# Patient Record
Sex: Female | Born: 1935
Health system: Southern US, Community
[De-identification: ages and names within clinical notes are randomized; demographics above are authoritative.]

## PROBLEM LIST (undated history)

## (undated) DIAGNOSIS — M1990 Unspecified osteoarthritis, unspecified site: Secondary | ICD-10-CM

## (undated) DIAGNOSIS — Z8673 Personal history of transient ischemic attack (TIA), and cerebral infarction without residual deficits: Secondary | ICD-10-CM

## (undated) DIAGNOSIS — H3320 Serous retinal detachment, unspecified eye: Secondary | ICD-10-CM

## (undated) DIAGNOSIS — I442 Atrioventricular block, complete: Secondary | ICD-10-CM

## (undated) DIAGNOSIS — D649 Anemia, unspecified: Secondary | ICD-10-CM

## (undated) DIAGNOSIS — E039 Hypothyroidism, unspecified: Secondary | ICD-10-CM

## (undated) DIAGNOSIS — I272 Pulmonary hypertension, unspecified: Secondary | ICD-10-CM

## (undated) DIAGNOSIS — I4891 Unspecified atrial fibrillation: Secondary | ICD-10-CM

## (undated) DIAGNOSIS — G459 Transient cerebral ischemic attack, unspecified: Secondary | ICD-10-CM

## (undated) DIAGNOSIS — H547 Unspecified visual loss: Secondary | ICD-10-CM

## (undated) DIAGNOSIS — R443 Hallucinations, unspecified: Secondary | ICD-10-CM

## (undated) DIAGNOSIS — R531 Weakness: Secondary | ICD-10-CM

## (undated) DIAGNOSIS — I951 Orthostatic hypotension: Secondary | ICD-10-CM

## (undated) DIAGNOSIS — H4720 Unspecified optic atrophy: Secondary | ICD-10-CM

## (undated) DIAGNOSIS — D696 Thrombocytopenia, unspecified: Secondary | ICD-10-CM

## (undated) HISTORY — PX: ATRIAL FIBRILLATION ABLATION: EP1191

## (undated) HISTORY — DX: Unspecified optic atrophy: H47.20

## (undated) HISTORY — DX: Transient cerebral ischemic attack, unspecified: G45.9

## (undated) HISTORY — DX: Unspecified atrial fibrillation: I48.91

## (undated) HISTORY — PX: RETINAL DETACHMENT SURGERY: SHX105

## (undated) HISTORY — PX: CARDIAC CATHETERIZATION: SHX172

## (undated) HISTORY — PX: CARDIAC PACEMAKER PLACEMENT: SHX583

## (undated) HISTORY — DX: Anemia, unspecified: D64.9

## (undated) HISTORY — DX: Personal history of transient ischemic attack (TIA), and cerebral infarction without residual deficits: Z86.73

## (undated) HISTORY — DX: Atrioventricular block, complete: I44.2

## (undated) HISTORY — DX: Weakness: R53.1

## (undated) HISTORY — DX: Orthostatic hypotension: I95.1

## (undated) HISTORY — DX: Pulmonary hypertension, unspecified: I27.20

## (undated) HISTORY — DX: Hypothyroidism, unspecified: E03.9

## (undated) HISTORY — DX: Serous retinal detachment, unspecified eye: H33.20

## (undated) HISTORY — DX: Thrombocytopenia, unspecified: D69.6

---
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
## 1994-10-29 ENCOUNTER — Encounter: Payer: Self-pay | Admitting: Internal Medicine

## 1998-04-16 ENCOUNTER — Ambulatory Visit (HOSPITAL_COMMUNITY): Admission: RE | Admit: 1998-04-16 | Discharge: 1998-04-16 | Payer: Self-pay | Admitting: Cardiology

## 2001-05-30 ENCOUNTER — Encounter: Payer: Self-pay | Admitting: *Deleted

## 2001-05-30 ENCOUNTER — Ambulatory Visit (HOSPITAL_COMMUNITY): Admission: RE | Admit: 2001-05-30 | Discharge: 2001-05-30 | Payer: Self-pay | Admitting: *Deleted

## 2002-02-06 ENCOUNTER — Emergency Department (HOSPITAL_COMMUNITY): Admission: EM | Admit: 2002-02-06 | Discharge: 2002-02-06 | Payer: Self-pay | Admitting: Emergency Medicine

## 2002-02-06 ENCOUNTER — Encounter: Payer: Self-pay | Admitting: Emergency Medicine

## 2002-05-11 ENCOUNTER — Inpatient Hospital Stay (HOSPITAL_COMMUNITY): Admission: EM | Admit: 2002-05-11 | Discharge: 2002-05-14 | Payer: Self-pay | Admitting: Emergency Medicine

## 2002-05-11 ENCOUNTER — Encounter: Payer: Self-pay | Admitting: Emergency Medicine

## 2002-05-12 ENCOUNTER — Encounter: Payer: Self-pay | Admitting: *Deleted

## 2002-05-14 ENCOUNTER — Encounter: Payer: Self-pay | Admitting: Internal Medicine

## 2002-05-20 ENCOUNTER — Ambulatory Visit (HOSPITAL_COMMUNITY): Admission: RE | Admit: 2002-05-20 | Discharge: 2002-05-20 | Payer: Self-pay | Admitting: Cardiology

## 2002-05-27 ENCOUNTER — Encounter: Payer: Self-pay | Admitting: *Deleted

## 2002-05-27 ENCOUNTER — Emergency Department (HOSPITAL_COMMUNITY): Admission: EM | Admit: 2002-05-27 | Discharge: 2002-05-27 | Payer: Self-pay | Admitting: Emergency Medicine

## 2003-03-31 ENCOUNTER — Ambulatory Visit (HOSPITAL_COMMUNITY): Admission: RE | Admit: 2003-03-31 | Discharge: 2003-03-31 | Payer: Self-pay | Admitting: Cardiology

## 2003-08-24 ENCOUNTER — Ambulatory Visit (HOSPITAL_COMMUNITY): Admission: RE | Admit: 2003-08-24 | Discharge: 2003-08-24 | Payer: Self-pay | Admitting: Ophthalmology

## 2004-08-10 ENCOUNTER — Inpatient Hospital Stay (HOSPITAL_COMMUNITY): Admission: EM | Admit: 2004-08-10 | Discharge: 2004-08-12 | Payer: Self-pay | Admitting: Emergency Medicine

## 2004-09-08 ENCOUNTER — Inpatient Hospital Stay (HOSPITAL_COMMUNITY): Admission: AD | Admit: 2004-09-08 | Discharge: 2004-09-11 | Payer: Self-pay | Admitting: Cardiology

## 2007-05-06 ENCOUNTER — Emergency Department (HOSPITAL_COMMUNITY): Admission: EM | Admit: 2007-05-06 | Discharge: 2007-05-06 | Payer: Self-pay | Admitting: Emergency Medicine

## 2008-01-23 ENCOUNTER — Ambulatory Visit: Payer: Self-pay | Admitting: Cardiology

## 2008-01-23 ENCOUNTER — Inpatient Hospital Stay (HOSPITAL_COMMUNITY): Admission: EM | Admit: 2008-01-23 | Discharge: 2008-01-24 | Payer: Self-pay | Admitting: Emergency Medicine

## 2008-04-14 ENCOUNTER — Ambulatory Visit: Payer: Self-pay | Admitting: Internal Medicine

## 2008-04-14 ENCOUNTER — Inpatient Hospital Stay (HOSPITAL_COMMUNITY): Admission: EM | Admit: 2008-04-14 | Discharge: 2008-04-15 | Payer: Self-pay | Admitting: Emergency Medicine

## 2008-05-08 ENCOUNTER — Ambulatory Visit: Payer: Self-pay | Admitting: Internal Medicine

## 2008-05-24 ENCOUNTER — Emergency Department (HOSPITAL_COMMUNITY): Admission: EM | Admit: 2008-05-24 | Discharge: 2008-05-24 | Payer: Self-pay | Admitting: Emergency Medicine

## 2008-06-15 ENCOUNTER — Encounter: Payer: Self-pay | Admitting: Cardiology

## 2008-06-15 ENCOUNTER — Ambulatory Visit (HOSPITAL_COMMUNITY): Admission: RE | Admit: 2008-06-15 | Discharge: 2008-06-15 | Payer: Self-pay | Admitting: Cardiology

## 2008-06-16 ENCOUNTER — Inpatient Hospital Stay (HOSPITAL_COMMUNITY): Admission: RE | Admit: 2008-06-16 | Discharge: 2008-06-18 | Payer: Self-pay | Admitting: Internal Medicine

## 2008-06-16 ENCOUNTER — Ambulatory Visit: Payer: Self-pay | Admitting: Internal Medicine

## 2008-06-18 ENCOUNTER — Encounter: Payer: Self-pay | Admitting: Internal Medicine

## 2008-07-20 ENCOUNTER — Encounter: Payer: Self-pay | Admitting: Internal Medicine

## 2008-09-03 ENCOUNTER — Encounter: Payer: Self-pay | Admitting: Internal Medicine

## 2008-09-03 ENCOUNTER — Encounter: Admission: RE | Admit: 2008-09-03 | Discharge: 2008-09-03 | Payer: Self-pay | Admitting: Cardiology

## 2008-09-17 DIAGNOSIS — I495 Sick sinus syndrome: Secondary | ICD-10-CM | POA: Insufficient documentation

## 2008-09-17 DIAGNOSIS — I82409 Acute embolism and thrombosis of unspecified deep veins of unspecified lower extremity: Secondary | ICD-10-CM | POA: Insufficient documentation

## 2008-09-17 DIAGNOSIS — R079 Chest pain, unspecified: Secondary | ICD-10-CM

## 2008-09-17 DIAGNOSIS — R55 Syncope and collapse: Secondary | ICD-10-CM | POA: Insufficient documentation

## 2008-09-17 DIAGNOSIS — I951 Orthostatic hypotension: Secondary | ICD-10-CM | POA: Insufficient documentation

## 2008-09-17 DIAGNOSIS — Z950 Presence of cardiac pacemaker: Secondary | ICD-10-CM

## 2008-09-18 ENCOUNTER — Ambulatory Visit: Payer: Self-pay | Admitting: Internal Medicine

## 2008-10-19 ENCOUNTER — Encounter: Payer: Self-pay | Admitting: Internal Medicine

## 2008-12-14 ENCOUNTER — Encounter: Payer: Self-pay | Admitting: Internal Medicine

## 2009-03-24 ENCOUNTER — Ambulatory Visit: Payer: Self-pay | Admitting: Internal Medicine

## 2009-05-19 ENCOUNTER — Ambulatory Visit: Payer: Self-pay | Admitting: Vascular Surgery

## 2009-06-14 ENCOUNTER — Encounter: Payer: Self-pay | Admitting: Internal Medicine

## 2009-07-29 ENCOUNTER — Ambulatory Visit: Payer: Self-pay | Admitting: Internal Medicine

## 2009-07-29 ENCOUNTER — Inpatient Hospital Stay (HOSPITAL_COMMUNITY): Admission: EM | Admit: 2009-07-29 | Discharge: 2009-08-04 | Payer: Self-pay | Admitting: Emergency Medicine

## 2009-07-30 ENCOUNTER — Encounter: Payer: Self-pay | Admitting: Cardiology

## 2009-08-02 ENCOUNTER — Ambulatory Visit: Payer: Self-pay | Admitting: Physical Medicine & Rehabilitation

## 2009-10-27 ENCOUNTER — Ambulatory Visit: Payer: Self-pay | Admitting: Cardiology

## 2009-11-19 ENCOUNTER — Encounter: Payer: Self-pay | Admitting: Internal Medicine

## 2009-11-19 ENCOUNTER — Ambulatory Visit: Payer: Self-pay | Admitting: Cardiology

## 2009-12-17 ENCOUNTER — Ambulatory Visit: Payer: Self-pay | Admitting: Cardiology

## 2009-12-21 ENCOUNTER — Ambulatory Visit: Payer: Self-pay | Admitting: Cardiology

## 2009-12-21 ENCOUNTER — Inpatient Hospital Stay (HOSPITAL_COMMUNITY): Admission: EM | Admit: 2009-12-21 | Discharge: 2009-12-24 | Payer: Self-pay | Admitting: Emergency Medicine

## 2009-12-22 ENCOUNTER — Ambulatory Visit: Payer: Self-pay | Admitting: Physical Medicine & Rehabilitation

## 2009-12-23 ENCOUNTER — Encounter (INDEPENDENT_AMBULATORY_CARE_PROVIDER_SITE_OTHER): Payer: Self-pay | Admitting: Internal Medicine

## 2010-01-01 ENCOUNTER — Encounter: Payer: Self-pay | Admitting: Internal Medicine

## 2010-01-13 ENCOUNTER — Ambulatory Visit: Payer: Self-pay | Admitting: Cardiology

## 2010-01-13 ENCOUNTER — Encounter: Payer: Self-pay | Admitting: Internal Medicine

## 2010-02-02 ENCOUNTER — Ambulatory Visit: Payer: Self-pay | Admitting: Internal Medicine

## 2010-02-02 DIAGNOSIS — I4891 Unspecified atrial fibrillation: Secondary | ICD-10-CM | POA: Insufficient documentation

## 2010-02-14 ENCOUNTER — Ambulatory Visit: Payer: Self-pay | Admitting: Cardiology

## 2010-03-02 ENCOUNTER — Ambulatory Visit: Payer: Self-pay | Admitting: Cardiology

## 2010-03-16 ENCOUNTER — Inpatient Hospital Stay (HOSPITAL_COMMUNITY)
Admission: EM | Admit: 2010-03-16 | Discharge: 2010-03-18 | Payer: Self-pay | Source: Home / Self Care | Attending: Internal Medicine | Admitting: Internal Medicine

## 2010-03-17 ENCOUNTER — Encounter (INDEPENDENT_AMBULATORY_CARE_PROVIDER_SITE_OTHER): Payer: Self-pay | Admitting: Internal Medicine

## 2010-03-17 ENCOUNTER — Ambulatory Visit: Payer: Self-pay | Admitting: Cardiology

## 2010-03-21 LAB — CBC
HCT: 40.1 % (ref 36.0–46.0)
HCT: 40.5 % (ref 36.0–46.0)
HCT: 39 % (ref 36.0–46.0)
Hemoglobin: 13.3 g/dL (ref 12.0–15.0)
Hemoglobin: 13.3 g/dL (ref 12.0–15.0)
Hemoglobin: 13 g/dL (ref 12.0–15.0)
MCH: 31.6 pg (ref 26.0–34.0)
MCH: 31.3 pg (ref 26.0–34.0)
MCH: 31.8 pg (ref 26.0–34.0)
MCHC: 33.2 g/dL (ref 30.0–36.0)
MCHC: 32.8 g/dL (ref 30.0–36.0)
MCHC: 33.3 g/dL (ref 30.0–36.0)
MCV: 95.2 fL (ref 78.0–100.0)
MCV: 95.3 fL (ref 78.0–100.0)
MCV: 95.4 fL (ref 78.0–100.0)
Platelets: 126 10*3/uL — ABNORMAL LOW (ref 150–400)
Platelets: 126 10*3/uL — ABNORMAL LOW (ref 150–400)
Platelets: 124 10*3/uL — ABNORMAL LOW (ref 150–400)
RBC: 4.21 MIL/uL (ref 3.87–5.11)
RBC: 4.25 MIL/uL (ref 3.87–5.11)
RBC: 4.09 MIL/uL (ref 3.87–5.11)
RDW: 13.3 % (ref 11.5–15.5)
RDW: 13.2 % (ref 11.5–15.5)
RDW: 13.2 % (ref 11.5–15.5)
WBC: 5.2 10*3/uL (ref 4.0–10.5)
WBC: 4.7 10*3/uL (ref 4.0–10.5)
WBC: 4.3 10*3/uL (ref 4.0–10.5)

## 2010-03-21 LAB — PROTIME-INR
INR: 1.66 — ABNORMAL HIGH (ref 0.00–1.49)
INR: 1.67 — ABNORMAL HIGH (ref 0.00–1.49)
INR: 1.72 — ABNORMAL HIGH (ref 0.00–1.49)
Prothrombin Time: 19.8 seconds — ABNORMAL HIGH (ref 11.6–15.2)
Prothrombin Time: 19.9 seconds — ABNORMAL HIGH (ref 11.6–15.2)
Prothrombin Time: 20.3 seconds — ABNORMAL HIGH (ref 11.6–15.2)

## 2010-03-21 LAB — APTT
aPTT: 50 seconds — ABNORMAL HIGH (ref 24–37)
aPTT: 51 seconds — ABNORMAL HIGH (ref 24–37)
aPTT: 55 seconds — ABNORMAL HIGH (ref 24–37)

## 2010-03-21 LAB — BASIC METABOLIC PANEL
BUN: 18 mg/dL (ref 6–23)
BUN: 11 mg/dL (ref 6–23)
CO2: 25 mEq/L (ref 19–32)
CO2: 26 mEq/L (ref 19–32)
Calcium: 8.4 mg/dL (ref 8.4–10.5)
Calcium: 8.4 mg/dL (ref 8.4–10.5)
Chloride: 105 mEq/L (ref 96–112)
Chloride: 109 mEq/L (ref 96–112)
Creatinine, Ser: 0.98 mg/dL (ref 0.4–1.2)
Creatinine, Ser: 0.9 mg/dL (ref 0.4–1.2)
GFR calc Af Amer: >60 mL/min (ref >60)
GFR calc Af Amer: >60 mL/min (ref >60)
GFR calc non Af Amer: 55 mL/min — ABNORMAL LOW (ref >60)
GFR calc non Af Amer: >60 mL/min (ref >60)
Glucose, Bld: 97 mg/dL (ref 70–99)
Glucose, Bld: 96 mg/dL (ref 70–99)
Potassium: 3.5 mEq/L (ref 3.5–5.1)
Potassium: 3.8 mEq/L (ref 3.5–5.1)
Sodium: 139 mEq/L (ref 135–145)
Sodium: 141 mEq/L (ref 135–145)

## 2010-03-21 LAB — DIFFERENTIAL
Basophils Absolute: 0 10*3/uL (ref 0.0–0.1)
Basophils Relative: 0 % (ref 0–1)
Eosinophils Absolute: 0 10*3/uL (ref 0.0–0.7)
Eosinophils Relative: 1 % (ref 0–5)
Lymphocytes Relative: 27 % (ref 12–46)
Lymphs Abs: 1.4 10*3/uL (ref 0.7–4.0)
Monocytes Absolute: 0.5 10*3/uL (ref 0.1–1.0)
Monocytes Relative: 10 % (ref 3–12)
Neutro Abs: 3.2 10*3/uL (ref 1.7–7.7)
Neutrophils Relative %: 62 % (ref 43–77)

## 2010-03-21 LAB — CK TOTAL AND CKMB (NOT AT ARMC)
CK, MB: 1.3 ng/mL (ref 0.3–4.0)
Relative Index: INVALID (ref 0.0–2.5)
Total CK: 76 U/L (ref 7–177)

## 2010-03-21 LAB — TROPONIN I: Troponin I: 0.02 ng/mL (ref 0.00–0.06)

## 2010-03-21 LAB — POCT CARDIAC MARKERS
CKMB, poc: <1 ng/mL — ABNORMAL LOW (ref 1.0–8.0)
CKMB, poc: <1 ng/mL — ABNORMAL LOW (ref 1.0–8.0)
Myoglobin, poc: 56.4 ng/mL (ref 12–200)
Myoglobin, poc: 59.7 ng/mL (ref 12–200)
Troponin i, poc: <0.05 ng/mL (ref 0.00–0.09)
Troponin i, poc: <0.05 ng/mL (ref 0.00–0.09)

## 2010-03-21 LAB — CARDIAC PANEL(CRET KIN+CKTOT+MB+TROPI)
CK, MB: 1.3 ng/mL (ref 0.3–4.0)
CK, MB: 1.4 ng/mL (ref 0.3–4.0)
Relative Index: INVALID (ref 0.0–2.5)
Relative Index: INVALID (ref 0.0–2.5)
Total CK: 79 U/L (ref 7–177)
Total CK: 83 U/L (ref 7–177)
Troponin I: 0.01 ng/mL (ref 0.00–0.06)
Troponin I: <0.01 ng/mL (ref 0.00–0.06)

## 2010-03-21 LAB — TSH: TSH: 3.655 u[IU]/mL (ref 0.350–4.500)

## 2010-03-21 LAB — MAGNESIUM: Magnesium: 2.7 mg/dL — ABNORMAL HIGH (ref 1.5–2.5)

## 2010-03-23 ENCOUNTER — Ambulatory Visit: Payer: Self-pay | Admitting: Cardiology

## 2010-03-28 LAB — HEPARIN INDUCED THROMBOCYTOPENIA PNL
Patient O.D.: 0.457
UFH High Dose UFH H: 0 % Release
UFH Low Dose 0.1 IU/mL: 0 % Release
UFH Low Dose 0.5 IU/mL: 0 % Release
UFH SRA Result: NEGATIVE

## 2010-04-07 NOTE — Letter (Signed)
Summary: [facility] Office Visit Note    [facility] Office Visit Note    Imported By: Roderic Ovens 12/23/2009 10:21:13  _____________________________________________________________________  External Attachment:    Type:   Image     Comment:   External Document

## 2010-04-07 NOTE — Miscellaneous (Signed)
Summary: Device preload  Clinical Lists Changes  Observations: Added new observation of PPM INDICATN: A-fib (01/01/2010 9:28) Added new observation of MAGNET RTE: BOL 85 ERI  65 (01/01/2010 9:28) Added new observation of PPMLEADSTAT2: active (01/01/2010 9:28) Added new observation of PPMLEADSER2: [id number]  (01/01/2010 9:28) Added new observation of PPMLEADMOD2: [id number]  (01/01/2010 9:28) Added new observation of PPMLEADDOI2: 05/13/2002  (01/01/2010 9:28) Added new observation of PPMLEADLOC2: RV  (01/01/2010 9:28) Added new observation of PPMLEADSTAT1: active  (01/01/2010 9:28) Added new observation of PPMLEADSER1: [id number]  (01/01/2010 9:28) Added new observation of PPMLEADMOD1: 1642T  (01/01/2010 9:28) Added new observation of PPMLEADDOI1: 05/13/2002  (01/01/2010 9:28) Added new observation of PPMLEADLOC1: RA  (01/01/2010 9:28) Added new observation of PPM IMP MD: Sherryl Manges, MD  (01/01/2010 9:28) Added new observation of PPM DOI: 05/13/2002  (01/01/2010 9:28) Added new observation of PPM SERL#: [id number]  (01/01/2010 9:28) Added new observation of PPM MODL#: [id number]  (01/01/2010 6:96) Added new observation of PACEMAKERMFG: Medtronic  (01/01/2010 9:28) Added new observation of PPM REFER MD: Vonna Drafts  (01/01/2010 9:28) Added new observation of PACEMAKER MD: Hillis Range, MD  (01/01/2010 9:28)      PPM Specifications Following MD:  Hillis Range, MD     Referring MD:  Vonna Drafts PPM Vendor:  Medtronic     PPM Model Number:  [id number]     PPM Serial Number:  [id number] PPM DOI:  05/13/2002     PPM Implanting MD:  Sherryl Manges, MD  Lead 1    Location: RA     DOI: 05/13/2002     Model #: [id number]     Serial #: [id number]     Status: active Lead 2    Location: RV     DOI: 05/13/2002     Model #: [id number]     Serial #: [id number]     Status: active  Magnet Response Rate:  BOL 85 ERI  65  Indications:  A-fib

## 2010-04-07 NOTE — Cardiovascular Report (Signed)
Summary: Interrogation Report   Interrogation Report   Imported By: Kassie Mends 04/14/2009 09:25:06  _____________________________________________________________________  External Attachment:    Type:   Image     Comment:   External Document

## 2010-04-07 NOTE — Consult Note (Signed)
Summary: [facility]  [facility]   Imported By: Marylou Mccoy 06/17/2009 15:11:40  _____________________________________________________________________  External Attachment:    Type:   Image     Comment:   External Document

## 2010-04-07 NOTE — Cardiovascular Report (Signed)
Summary: Office Visit   Office Visit   Imported By: Roderic Ovens 02/08/2010 10:56:35  _____________________________________________________________________  External Attachment:    Type:   Image     Comment:   External Document

## 2010-04-07 NOTE — Letter (Signed)
Summary: [facility]  [facility]   Imported By: Marylou Mccoy 02/04/2010 14:24:07  _____________________________________________________________________  External Attachment:    Type:   Image     Comment:   External Document

## 2010-04-07 NOTE — Assessment & Plan Note (Signed)
Summary: pacer check/medtronic  Medications Added WARFARIN SODIUM 6 MG TABS (WARFARIN SODIUM) Use as directed by Anticoagulation Clinic SIMVASTATIN 10 MG TABS (SIMVASTATIN) Take one tablet by mouth daily at bedtime FLUDROCORTISONE ACETATE 0.1 MG TABS (FLUDROCORTISONE ACETATE) 4 daily      Allergies Added:   Visit Type:  Follow-up Referring Provider:  Peter Swaziland, MD Primary Provider:  Severiano Gilbert, MD   History of Present Illness: The patient presents today for routine electrophysiology followup. She is pleased to have had no symptoms of afib since her ablation.  She has however had a stroke which required hospitalization.  She continues to have trouble with L leg weakness.  The patient denies symptoms of palpitations, chest pain, shortness of breath, orthopnea, PND, lower extremity edema, dizziness, presyncope, syncope, or neurologic sequela. The patient is tolerating medications without difficulties and is otherwise without complaint today.   Current Medications (verified): 1)  Tikosyn 250 Mcg Caps (Dofetilide) .... Take One Capsule By Mouth Twice A Day 2)  Aspirin Ec 325 Mg Tbec (Aspirin) .... Take One Tablet By Mouth Daily 3)  Warfarin Sodium 6 Mg Tabs (Warfarin Sodium) .... Use As Directed By Anticoagulation Clinic 4)  Simvastatin 10 Mg Tabs (Simvastatin) .... Take One Tablet By Mouth Daily At Bedtime 5)  Fludrocortisone Acetate 0.1 Mg Tabs (Fludrocortisone Acetate) .... 4 Daily  Allergies (verified): 1)  ! * Pain Meds  Past History:  Past Medical History:  1. Paroxysmal atrial fibrillation s/p PVI 06/2008  2. Tachycardia bradycardia syndrome, status post dual-chamber pacemaker implantation by Dr. Berton Mount in 2004.   3. Neurocardiogenic syncope.   4. Left heart catheterization in 2005 revealed normal coronary arteries with a preserved ejection fraction.   5. CVA  Social History: Reviewed history from 09/17/2008 and no changes required.  The patient lives [place].   She denies any tobacco,   alcohol or drugs.   Review of Systems       All systems are reviewed and negative except as listed in the HPI.   Vital Signs:  Patient profile:   77 year old female Height:      65 inches Weight:      115 pounds BMI:     19.21 Pulse rate:   60 / minute BP sitting:   100 / 70  (left arm)  Vitals Entered By: Laurance Flatten CMA (February 02, 2010 9:46 AM)  Physical Exam  General:  Well developed, well nourished, in no acute distress. Head:  normocephalic and atraumatic Eyes:  L eye with prurulent drainage and swelling Mouth:  Teeth, gums and palate normal. Oral mucosa normal. Neck:  Neck supple, no JVD. No masses, thyromegaly or abnormal cervical nodes. Chest Wall:  pacemaker site is well healed Lungs:  Clear bilaterally to auscultation and percussion. Heart:  Non-displaced PMI, chest non-tender; regular rate and rhythm, S1, S2 without murmurs, rubs or gallops. Carotid upstroke normal, no bruit. Normal abdominal aortic size, no bruits. Femorals normal pulses, no bruits. Pedals normal pulses. No edema, no varicosities. Abdomen:  Bowel sounds positive; abdomen soft and non-tender without masses, organomegaly, or hernias noted. No hepatosplenomegaly. Msk:  walks slowly with L leg weakness Extremities:  No clubbing or cyanosis. Skin:  Intact without lesions or rashes. Psych:  Normal affect.   PPM Specifications Following MD:  Hillis Range, MD     Referring MD:  Vonna Drafts PPM Vendor:  Medtronic     PPM Model Number:  [id number]     PPM Serial Number:  [id number] PPM  DOI:  05/13/2002     PPM Implanting MD:  Sherryl Manges, MD  Lead 1    Location: RA     DOI: 05/13/2002     Model #: [id number]     Serial #: [id number]     Status: active Lead 2    Location: RV     DOI: 05/13/2002     Model #: [id number]     Serial #: [id number]     Status: active  Magnet Response Rate:  BOL 85 ERI  65  Indications:  A-fib   PPM Follow Up Battery Voltage:  2.74 V     Battery Est. Longevity:   3 YRS       PPM Device Measurements Atrium  Amplitude: 5.60 mV, Impedance: 664 ohms, Threshold: 1.00 V at 0.40 msec Right Ventricle  Amplitude: 15.68 mV,   Episodes MS Episodes:  0     Ventricular High Rate:  0     Atrial Pacing:  10.9%      Parameters Mode:  ADI     Lower Rate Limit:  50     Next Remote Date:  05/05/2010     Next Cardiology Appt Due:  01/05/2011 Tech Comments:  NORMAL DEVICE FUNCTION.  NO EPISODES RECORDED.  NO CHANGES MADE.  CARELINK 05-05-10 AND ROV IN 12 MTHS W/JA. Vella Kohler  February 02, 2010 9:42 AM MD Comments:  agree  Impression & Recommendations:  Problem # 1:  PAROXYSMAL ATRIAL FIBRILLATION (ICD-427.31) doing very well s/p ablation without recurrence she is reluctant to stop tikosyn but has not had recurrence I have recommended that she stop tikosyn, but she wishes to continue tikosyn for now continue coumadin longterm  Problem # 2:  BRADYCARDIA-TACHYCARDIA SYNDROME (ICD-427.81) normal pacemaker function no changes  Patient Instructions: 1)  Your physician wants you to follow-up in:19months with DrAllred   You will receive a reminder letter in the mail two months in advance. If you don't receive a letter, please call our office to schedule the follow-up appointment. 2)  Caelink transmission 05/05/2010

## 2010-04-07 NOTE — Assessment & Plan Note (Signed)
Summary: 6 MO F/U  Medications Added TIKOSYN 250 MCG CAPS (DOFETILIDE) Take one capsule by mouth twice a day ATENOLOL 25 MG TABS (ATENOLOL) Take one tablet by mouth two times a day ASPIRIN EC 325 MG TBEC (ASPIRIN) Take one tablet by mouth daily * FLORINEFF 50MG  1 tab two times a day        Visit Type:  Follow-up Referring Provider:  Peter Swaziland, MD Primary Provider:  Severiano Gilbert, MD   History of Present Illness: The patient presents today for routine electrophysiology followup. She reports doing very well since last being seen in our clinic. Is is pleased to have had no symptoms of afib since her ablation.  The patient denies symptoms of palpitations, chest pain, shortness of breath, orthopnea, PND, lower extremity edema, dizziness, presyncope, syncope, or neurologic sequela. She has had difficulty with her recent L eye surgery and reports pain in her eye.  The patient is tolerating medications without difficulties and is otherwise without complaint today.   Current Medications (verified): 1)  Tikosyn 250 Mcg Caps (Dofetilide) .... Take One Capsule By Mouth Twice A Day 2)  Atenolol 25 Mg Tabs (Atenolol) .... Take One Tablet By Mouth Two Times A Day 3)  Aspirin Ec 325 Mg Tbec (Aspirin) .... Take One Tablet By Mouth Daily 4)  Florineff 50mg  .... 1 Tab Two Times A Day  Allergies: 1)  ! * Pain Meds  Past History:  Past Medical History: Reviewed history from 09/18/2008 and no changes required.  1. Paroxysmal atrial fibrillation s/p PVI 06/2008  2. Tachycardia bradycardia syndrome, status post dual-chamber pacemaker implantation by Dr. Berton Mount in 2004.   3. Neurocardiogenic syncope.   4. Left heart catheterization in 2005 revealed normal coronary arteries with a preserved ejection fraction.   Vital Signs:  Patient profile:   77 year old female Height:      65 inches Weight:      120 pounds BMI:     20.04 Pulse rate:   58 / minute BP sitting:   132 / 70  (left  arm)  Vitals Entered By: Laurance Flatten CMA (March 24, 2009 9:42 AM)  Physical Exam  General:  Well developed, well nourished, in no acute distress. Head:  normocephalic and atraumatic Eyes:  L eye with prurulent drainage and swelling Nose:  no deformity, discharge, inflammation, or lesions Mouth:  Teeth, gums and palate normal. Oral mucosa normal. Neck:  Neck supple, no JVD. No masses, thyromegaly or abnormal cervical nodes. Chest Wall:  pacemaker site is well healed Lungs:  Clear bilaterally to auscultation and percussion. Heart:  Non-displaced PMI, chest non-tender; regular rate and rhythm, S1, S2 without murmurs, rubs or gallops. Carotid upstroke normal, no bruit. Normal abdominal aortic size, no bruits. Femorals normal pulses, no bruits. Pedals normal pulses. No edema, no varicosities. Abdomen:  Bowel sounds positive; abdomen soft and non-tender without masses, organomegaly, or hernias noted. No hepatosplenomegaly. Msk:  Back normal, normal gait. Muscle strength and tone normal. Pulses:  pulses normal in all 4 extremities Extremities:  No clubbing or cyanosis. Neurologic:  Alert and oriented x 3.  CNII-XII intact, strength/ sensation are intact Skin:  Intact without lesions or rashes. Cervical Nodes:  no significant adenopathy Psych:  Normal affect.   EKG  Procedure date:  03/24/2009  Findings:      sinus rhythm 58 bpm, QTc 494  Impression & Recommendations:  Problem # 1:  ATRIAL FIBRILLATION WITH RAPID VENTRICULAR RESPONSE (ICD-427.31) Doing very well.  No episodes of  afib upon review of her pacemaker which I interrogated today. I recommended stopping tikosyn, however, the patient is very reluctant to stop antiarrhythmics at this time. She will contact my office if further afib occurs.  The following medications were removed from the medication list:    Warfarin Sodium 5 Mg Tabs (Warfarin sodium) ..... Use as directed by anticoagulation clinic Her updated medication list  for this problem includes:    Tikosyn 250 Mcg Caps (Dofetilide) .Marland Kitchen... Take one capsule by mouth twice a day    Atenolol 25 Mg Tabs (Atenolol) .Marland Kitchen... Take one tablet by mouth two times a day    Aspirin Ec 325 Mg Tbec (Aspirin) .Marland Kitchen... Take one tablet by mouth daily  Orders: EKG w/ Interpretation (93000)  Problem # 2:  PACEMAKER, PERMANENT (ICD-V45.01) normal pacemaker function. no changes today  Patient Instructions: 1)  follow up with Dr Swaziland 2)  return here prn

## 2010-04-07 NOTE — Discharge Summary (Signed)
NAME:  Katrina Rodriguez, Katrina Rodriguez                   ACCOUNT NO.:  [id number]  MEDICAL RECORD NO.:  [id number]          PATIENT TYPE:  INP  LOCATION:  3733                         FACILITY:  [facility]  PHYSICIAN:  Ladell Pier, M.D.   DATE OF BIRTH:  [birth year]  DATE OF ADMISSION:  03/17/2010 DATE OF DISCHARGE:  03/18/2010                              DISCHARGE SUMMARY   DISCHARGE DIAGNOSES: 1. Chest pain/palpitations. 2. Atrial fibrillation, status post ablation in 2010. 3. History of tachybrady syndrome, status post permanent pacemaker     placement. 4. Mild pulmonary hypertension. 5. History of right-sided cerebrovascular accident in June 2011. 6. Chronic anemia. 7. Thrombocytopenia. 8. History of noncardiogenic syncope.  DISCHARGE MEDICATIONS:  Same as admission medications. 1. Aspirin 81 mg daily. 2. Coumadin 6 mg on Tuesdays, Thursdays, Saturday, and Sunday.  The     patient will take an extra dose tonight.  INR is subtherapeutic and get INR checked on Monday. 3. Fludrocortisone 0.1 mg 2 tablets twice daily. 4. Simvastatin 10 mg at bedtime. 5. Tikosyn 250 mcg twice daily.  FOLLOWUP APPOINTMENTS:  The patient is to follow up on Monday for INR check and to keep appointment with Dr. Swaziland on April 18, 2010, and to follow up with Dr. Katrinka Blazing in 1-2 weeks.  PROCEDURES:  The patient had pacemaker evaluated which is normal.  Chest x-ray, no acute cardiopulmonary findings.  CONSULTANTS:  [facility].  HISTORY OF PRESENT ILLNESS:  The patient is a 77 year old female with history of atrial fibrillation, on chronic Coumadin, status post pacemaker placement, history of right-sided CVA in June 2011, history of thrombocytopenia, orthostasis, on Florinef who presents to [facility] ER complaining of substernal chest pain along with left shoulder pain.  She denied any shortness of breath, abdominal cramps or pain, nausea or vomiting.  Please see admission note for remainder of history,  past medical history, family history, social history, meds, and allergies.  REVIEW OF SYSTEMS:  Per admission H&P.  PHYSICAL EXAMINATION AT TIME OF DISCHARGE:  VITAL SIGNS:  Temperature 98.3, pulse 57, respirations 18, blood pressure 101/58, and pulse ox 96% on room air. GENERAL:  The patient is sitting up in bed, well-nourished white female. HEENT:  Normocephalic and atraumatic.  Pupils are reactive to light. Throat is without erythema. CARDIOVASCULAR:  Regular rate and rhythm. LUNGS:  Clear bilaterally. ABDOMEN:  Positive bowel sounds. EXTREMITIES:  No edema.  HOSPITAL COURSE:  Chest pain/palpitations.  The patient was admitted to the hospital.  She had cardiac markers done that was negative. Cardiology was consulted and evaluated her pacemaker, it was fine. Cardiology recommended the patient to follow up outpatient.  Her INR is mildly subtherapeutic at 1.72, but the patient really wants to go home. Discussed with Dr. Swaziland.  We will send her home on increased dose of Coumadin.  She will take an extra 6 mg tonight.  She will take one 6 mg on Saturday night and she will follow up with the office on Monday morning to get her INR checked.  With her atrial fibrillation, she will continue taking the Coumadin as mentioned before even though she  has a risk of fall since she does have a history of CVA.  DISCHARGE LABORATORY DATA:  Sodium 141, potassium 3.8, chloride 109, CO2 of 26, glucose 96, BUN 11, creatinine 0.90, and calcium 8.4.  PT 20.3 and INR 1.72.  WBC 4.3, hemoglobin 13, MCV 95.4, and platelet 124.  TSH of 3.655.  Time spent with the patient and talking disposition and doing this discharge is approximately 35 minutes.     Ladell Pier, M.D.     [place]/MEDQ  D:  03/18/2010  T:  03/19/2010  Job:  [id number]  Electronically Signed by Ladell Pier M.D. on 04/07/2010 01:35:26 PM

## 2010-04-11 NOTE — Consult Note (Signed)
NAME:  Katrina Rodriguez, Katrina Rodriguez                   ACCOUNT NO.:  [id number]  MEDICAL RECORD NO.:  [id number]           PATIENT TYPE:  LOCATION:                                 FACILITY:  PHYSICIAN:  Madolyn Frieze. Jens Som, MD, FACCDATE OF BIRTH:  11-23-1928  DATE OF CONSULTATION:  03/17/2010 DATE OF DISCHARGE:                                CONSULTATION   PRIMARY CARDIOLOGIST:  [name], MD  ELECTROPHYSIOLOGIST:  Hillis Range, MD  PRIMARY CARE DOCTOR:  Dario Guardian, MD  THE PATIENT PROFILE:  This is a 77 year old female with history of AFib, tachybrady syndrome, status post pacemaker and subsequently AFib ablation presents with recurrent tachy palpitations and chest pain.  PROBLEMS: 1. Paroxysmal atrial fibrillation/tachybrady syndrome.     a.     May 13, 2002, status post Medtronic EnPulse dual-chamber      permanent pacemaker.     b.     Status post atrial fibrillation radiofrequency catheter      ablation on June 16, 2008.     c.     December 23, 2009, a 2-D echocardiogram, ejection fraction 60-      65% with normal wall motion.  Mild-to-moderate aortic      insufficiency, mild mitral regurgitation, moderate tricuspid      regurgitation.  Pulmonary artery systolic pressure 34 mmHg. 2. History of chest pain.     a.     March 31, 2003, cardiac catheterization, left main normal.      Left anterior descending 10%.  Left circumflex normal.  Right      coronary artery normal. 3. Hypertension. 4. History of hyperthyroidism. 5. History of recurrent falls, approximately 5-6 per year. 6. History of neurocardiogenic syncope and orthostatic hypotension, on     Florinef therapy. 7. Anemia. 8. History of thrombocytopenia while on heparin though HIT panel has     never been evaluated. 9. History of right brain cerebrovascular accident in June 2011 with     mild left hemiparesis.  ALLERGIES:  AMIODARONE, MORPHINE, CODEINE, DARVON, PENICILLIN, DEMEROL, AMOXICILLIN.  HISTORY OF PRESENT  ILLNESS:  This is a 77 year old female with the above problem list.  She is status post atrial fibrillation ablation in April 2010, has been maintained on Tikosyn and Coumadin therapy.  She has not had any rapid AFib since her ablation but over the past month has noted increased heart rates in the 90-110 range.  This is not symptomatic. Last evening, she was at church and sitting, had sudden onset of tachy palpitations with 8/10 chest pain and mild dyspnea, very similar to previous episodes of rapid AFib.  A physician in the congregation drove the patient to the ED and on the way in, palpitations resolved, though chest pain lingered.  She has been admitted and has been in sinus bradycardia with atrial pacing on demand since admission.  She has had no rapid rates or AFib.  The chest pain has completely resolved, and enzymes are negative while ECG is nonacute.  Of note, INR is subtherapeutic at 1.67.  CURRENT MEDICATIONS:  Aspirin 81 mg daily, Coumadin, Florinef 0.1  mg 2 tablets b.i.d., simvastatin 10 mg nightly, Tikosyn 250 mcg b.i.d.  FAMILY HISTORY:  Mother died at 64 of natural causes.  Father died of an MI at 35.  She has a brother that died and had prior history of ICD placement.  She has another brother that died in his 58s of unknown cause.  She has a sister with history of coronary disease and CABG.  SOCIAL HISTORY:  The patient lives in [place] by herself.  She is retired.  She denies tobacco, alcohol, or drug use.  She tries to remain active at home and does not regularly exercise.  REVIEW OF SYSTEMS:  Positive for occasional exertional chest pain as well as chest pain last night.  She also has had exertional dyspnea with higher levels of activity.  She had a history of presyncope and syncope in the setting of neurocardiogenic syncope and orthostatic hypotension. She had rapid tachy palpitations last night.  She is a full code. Otherwise, all systems reviewed and  negative.  PHYSICAL EXAMINATION:  VITAL SIGNS:  Temperature 98.0, heart rate 56, respirations 16, blood pressure 115/67, pulse ox 95% on room air, weight 49.2 kg. GENERAL:  A pleasant white female, in no acute distress, awake, alert, and oriented x3.  She has a normal affect. HEENT:  Normal. NEUROLOGIC:  Grossly intact.  Nonfocal. SKIN:  Warm and dry without lesions or masses. NECK:  Supple without bruits or JVD. LUNGS:  Respirations are unlabored.  Clear to auscultation. CARDIAC:  Regular S1 and S2.  No S3, S4, murmurs. ABDOMEN:  Round, soft, nontender, nondistended.  Bowel sounds present x4. EXTREMITIES:  Warm, dry, pink.  No clubbing, cyanosis, or edema. Dorsalis pedis and posterior tibial pulses 2+ and equal bilaterally.  Chest x-ray shows no acute cardiopulmonary findings. EKG shows sinus rhythm, rate of 79, normal axis, no acute ST or T changes.  LABORATORY WORK:  Hemoglobin 13.3, hematocrit 40.1, WBC 5.2, platelets 126.  Sodium 139, potassium 3.5, chloride 105, CO2 is 25, BUN 18, creatinine 0.98, glucose 97.  CK 79, MB 1.2, troponin I 0.01, INR 1.67.  ASSESSMENT AND PLAN: 1. Paroxysmal atrial fibrillation/tachybrady syndrome.  The patient is     status post pacer placement in 2004 and subsequently atrial     fibrillation ablation in 2010.  She had rapid tachy palpitations     associated with chest pain similar to prior episodes of rapid     atrial fibrillation in the past.  She has had no documented     arrhythmia since admission, and enzymes are negative.  She has a     history of normal cath.  We will have the [employer] representative     interrogate her device to determine frequency of atrial     fibrillation as her rates have been up to the 90s to 110 range over     the past month, but she notes that she is asymptomatic with those     rates.  Plan to continue Tikosyn and Coumadin and we will place her     on Lovenox while INR is subtherapeutic, and she is  hospitalized.     If the patient has increasing frequency of atrial fibrillation     going forward, we may need to consider second ablation or alternate     antiarrhythmic.  Either way, we will arrange EP followup. 2. Chest pain, likely related to paroxysmal atrial     fibrillation/tachybrady syndrome.  Enzymes negative.  ECGs without  acute changes.  Symptoms resolved once palpitations resolved.     History of normal catheterization in 2005. 3. History of syncope with orthostasis.  The patient reports falling     and losing consciousness approximately 5-6 times per year.     Regardless, given history of stroke and atrial fibrillation, she     remains on Coumadin therapy and understands the risks. 4. History of thrombocytopenia.  The patient has mild thrombocytopenia     at baseline but in the past as recent as May 2011, the patient had     thrombocytopenia down to 72,000 while on IV heparin.  It does not     appear that a HIT panel was ever checked.  I will place her on     Lovenox given subtherapeutic INR and continue to follow platelets     closely.  We will go ahead and check a HIT panel.     Nicolasa Ducking, ANP   ______________________________ Madolyn Frieze. Jens Som, MD, [facility]    CB/MEDQ  D:  03/17/2010  T:  03/18/2010  Job:  [id number]  Electronically Signed by Nicolasa Ducking ANP on 04/04/2010 12:25:19 PM Electronically Signed by Olga Millers MD [facility] on 04/11/2010 08:10:41 AM

## 2010-04-18 ENCOUNTER — Ambulatory Visit (INDEPENDENT_AMBULATORY_CARE_PROVIDER_SITE_OTHER): Payer: Medicare Other | Admitting: Cardiology

## 2010-04-18 DIAGNOSIS — R55 Syncope and collapse: Secondary | ICD-10-CM

## 2010-04-18 DIAGNOSIS — I4891 Unspecified atrial fibrillation: Secondary | ICD-10-CM

## 2010-04-18 DIAGNOSIS — I495 Sick sinus syndrome: Secondary | ICD-10-CM

## 2010-04-18 DIAGNOSIS — I6350 Cerebral infarction due to unspecified occlusion or stenosis of unspecified cerebral artery: Secondary | ICD-10-CM

## 2010-05-05 ENCOUNTER — Encounter: Payer: Self-pay | Admitting: Internal Medicine

## 2010-05-05 ENCOUNTER — Encounter (INDEPENDENT_AMBULATORY_CARE_PROVIDER_SITE_OTHER): Payer: Medicare Other

## 2010-05-05 DIAGNOSIS — I495 Sick sinus syndrome: Secondary | ICD-10-CM

## 2010-05-18 LAB — COMPREHENSIVE METABOLIC PANEL
ALT: 11 U/L (ref 0–35)
Albumin: 3.8 g/dL (ref 3.5–5.2)
Alkaline Phosphatase: 61 U/L (ref 39–117)
Calcium: 8.8 mg/dL (ref 8.4–10.5)
Glucose, Bld: 95 mg/dL (ref 70–99)
Potassium: 3.8 mEq/L (ref 3.5–5.1)
Sodium: 141 mEq/L (ref 135–145)
Total Protein: 6.3 g/dL (ref 6.0–8.3)

## 2010-05-18 LAB — APTT: aPTT: 36 s (ref 24–37)

## 2010-05-18 LAB — CBC
HCT: 40 % (ref 36.0–46.0)
HCT: 41.5 % (ref 36.0–46.0)
Hemoglobin: 13.5 g/dL (ref 12.0–15.0)
MCH: 31.8 pg (ref 26.0–34.0)
MCHC: 33.5 g/dL (ref 30.0–36.0)
MCHC: 33.8 g/dL (ref 30.0–36.0)
MCV: 94.1 fL (ref 78.0–100.0)
Platelets: 115 10*3/uL — ABNORMAL LOW (ref 150–400)
Platelets: 120 K/uL — ABNORMAL LOW (ref 150–400)
Platelets: 136 10*3/uL — ABNORMAL LOW (ref 150–400)
RBC: 4.22 MIL/uL (ref 3.87–5.11)
RBC: 4.25 MIL/uL (ref 3.87–5.11)
RDW: 13.2 % (ref 11.5–15.5)
RDW: 13.2 % (ref 11.5–15.5)
RDW: 13.5 % (ref 11.5–15.5)
WBC: 4.6 10*3/uL (ref 4.0–10.5)
WBC: 4.9 10*3/uL (ref 4.0–10.5)
WBC: 5 K/uL (ref 4.0–10.5)

## 2010-05-18 LAB — CK TOTAL AND CKMB (NOT AT ARMC)
CK, MB: 1.2 ng/mL (ref 0.3–4.0)
Relative Index: INVALID (ref 0.0–2.5)
Total CK: 62 U/L (ref 7–177)

## 2010-05-18 LAB — TROPONIN I: Troponin I: <0.01 ng/mL (ref 0.00–0.06)

## 2010-05-18 LAB — PROTIME-INR
INR: 1.85 — ABNORMAL HIGH (ref 0.00–1.49)
INR: 2.14 — ABNORMAL HIGH (ref 0.00–1.49)
INR: 2.19 — ABNORMAL HIGH (ref 0.00–1.49)
Prothrombin Time: 21.5 seconds — ABNORMAL HIGH (ref 11.6–15.2)
Prothrombin Time: 24.1 seconds — ABNORMAL HIGH (ref 11.6–15.2)
Prothrombin Time: 24.5 seconds — ABNORMAL HIGH (ref 11.6–15.2)

## 2010-05-18 LAB — CARDIAC PANEL(CRET KIN+CKTOT+MB+TROPI)
CK, MB: 1 ng/mL (ref 0.3–4.0)
Relative Index: INVALID (ref 0.0–2.5)
Relative Index: INVALID (ref 0.0–2.5)
Troponin I: <0.01 ng/mL (ref 0.00–0.06)

## 2010-05-18 LAB — MAGNESIUM: Magnesium: 2.5 mg/dL (ref 1.5–2.5)

## 2010-05-18 LAB — LIPID PANEL
Cholesterol: 207 mg/dL — ABNORMAL HIGH (ref 0–200)
Total CHOL/HDL Ratio: 4.4 RATIO

## 2010-05-18 LAB — DIFFERENTIAL
Eosinophils Absolute: 0 10*3/uL (ref 0.0–0.7)
Lymphs Abs: 1.1 10*3/uL (ref 0.7–4.0)
Monocytes Absolute: 0.5 10*3/uL (ref 0.1–1.0)
Monocytes Relative: 11 % (ref 3–12)
Neutro Abs: 3.2 10*3/uL (ref 1.7–7.7)
Neutrophils Relative %: 65 % (ref 43–77)

## 2010-05-18 LAB — HEMOGLOBIN A1C
Hgb A1c MFr Bld: 5.6 % (ref <5.7)
Mean Plasma Glucose: 114 mg/dL (ref <117)

## 2010-05-18 LAB — BASIC METABOLIC PANEL
BUN: 12 mg/dL (ref 6–23)
Calcium: 8.5 mg/dL (ref 8.4–10.5)
Chloride: 109 mEq/L (ref 96–112)
Creatinine, Ser: 1.04 mg/dL (ref 0.4–1.2)
GFR calc Af Amer: >60 mL/min (ref >60)
GFR calc non Af Amer: 51 mL/min — ABNORMAL LOW (ref >60)

## 2010-05-18 LAB — PHOSPHORUS: Phosphorus: 3.4 mg/dL (ref 2.3–4.6)

## 2010-05-23 ENCOUNTER — Encounter: Payer: Self-pay | Admitting: *Deleted

## 2010-05-23 ENCOUNTER — Encounter (INDEPENDENT_AMBULATORY_CARE_PROVIDER_SITE_OTHER): Payer: Medicare Other

## 2010-05-23 DIAGNOSIS — I4891 Unspecified atrial fibrillation: Secondary | ICD-10-CM

## 2010-05-23 DIAGNOSIS — Z7901 Long term (current) use of anticoagulants: Secondary | ICD-10-CM

## 2010-05-23 LAB — BASIC METABOLIC PANEL
BUN: 11 mg/dL (ref 6–23)
CO2: 25 mEq/L (ref 19–32)
Calcium: 7.8 mg/dL — ABNORMAL LOW (ref 8.4–10.5)
Chloride: 108 mEq/L (ref 96–112)
Chloride: 110 mEq/L (ref 96–112)
Creatinine, Ser: 0.87 mg/dL (ref 0.4–1.2)
Creatinine, Ser: 0.82 mg/dL (ref 0.4–1.2)
Glucose, Bld: 91 mg/dL (ref 70–99)
Glucose, Bld: 76 mg/dL (ref 70–99)

## 2010-05-23 LAB — CBC
HCT: 31.5 % — ABNORMAL LOW (ref 36.0–46.0)
HCT: 33.7 % — ABNORMAL LOW (ref 36.0–46.0)
HCT: 36.3 % (ref 36.0–46.0)
HCT: 36.8 % (ref 36.0–46.0)
HCT: 37 % (ref 36.0–46.0)
HCT: 37.8 % (ref 36.0–46.0)
HCT: 39.8 % (ref 36.0–46.0)
Hemoglobin: 10.7 g/dL — ABNORMAL LOW (ref 12.0–15.0)
Hemoglobin: 11.3 g/dL — ABNORMAL LOW (ref 12.0–15.0)
Hemoglobin: 12.2 g/dL (ref 12.0–15.0)
Hemoglobin: 12.7 g/dL (ref 12.0–15.0)
Hemoglobin: 13 g/dL (ref 12.0–15.0)
Hemoglobin: 13.8 g/dL (ref 12.0–15.0)
MCHC: 34.1 g/dL (ref 30.0–36.0)
MCHC: 34 g/dL (ref 30.0–36.0)
MCHC: 34.5 g/dL (ref 30.0–36.0)
MCHC: 34.6 g/dL (ref 30.0–36.0)
MCHC: 34.6 g/dL (ref 30.0–36.0)
MCV: 96.3 fL (ref 78.0–100.0)
MCV: 95.3 fL (ref 78.0–100.0)
MCV: 95.6 fL (ref 78.0–100.0)
MCV: 95.8 fL (ref 78.0–100.0)
MCV: 96.1 fL (ref 78.0–100.0)
MCV: 96.6 fL (ref 78.0–100.0)
Platelets: 100 10*3/uL — ABNORMAL LOW (ref 150–400)
Platelets: 108 10*3/uL — ABNORMAL LOW (ref 150–400)
Platelets: 121 10*3/uL — ABNORMAL LOW (ref 150–400)
Platelets: 75 10*3/uL — ABNORMAL LOW (ref 150–400)
RBC: 3.28 MIL/uL — ABNORMAL LOW (ref 3.87–5.11)
RBC: 3.45 MIL/uL — ABNORMAL LOW (ref 3.87–5.11)
RBC: 3.6 MIL/uL — ABNORMAL LOW (ref 3.87–5.11)
RBC: 3.88 MIL/uL (ref 3.87–5.11)
RBC: 3.93 MIL/uL (ref 3.87–5.11)
RDW: 13.4 % (ref 11.5–15.5)
RDW: 12.8 % (ref 11.5–15.5)
RDW: 13.1 % (ref 11.5–15.5)
RDW: 13.6 % (ref 11.5–15.5)
WBC: 10.7 10*3/uL — ABNORMAL HIGH (ref 4.0–10.5)
WBC: 4.8 10*3/uL (ref 4.0–10.5)
WBC: 4.9 10*3/uL (ref 4.0–10.5)
WBC: 5.1 10*3/uL (ref 4.0–10.5)
WBC: 5.2 10*3/uL (ref 4.0–10.5)
WBC: 6.4 10*3/uL (ref 4.0–10.5)

## 2010-05-23 LAB — HEPARIN LEVEL (UNFRACTIONATED)
Heparin Unfractionated: 0.31 IU/mL (ref 0.30–0.70)
Heparin Unfractionated: 0.41 IU/mL (ref 0.30–0.70)
Heparin Unfractionated: 0.64 IU/mL (ref 0.30–0.70)

## 2010-05-23 LAB — PROTIME-INR
INR: 1.45 (ref 0.00–1.49)
INR: 1.27 (ref 0.00–1.49)
Prothrombin Time: 17.5 seconds — ABNORMAL HIGH (ref 11.6–15.2)
Prothrombin Time: 17.7 seconds — ABNORMAL HIGH (ref 11.6–15.2)

## 2010-05-23 LAB — DIFFERENTIAL
Basophils Absolute: 0 10*3/uL (ref 0.0–0.1)
Basophils Relative: 0 % (ref 0–1)
Lymphocytes Relative: 11 % — ABNORMAL LOW (ref 12–46)
Neutro Abs: 6.4 10*3/uL (ref 1.7–7.7)
Neutrophils Relative %: 86 % — ABNORMAL HIGH (ref 43–77)

## 2010-05-23 LAB — COMPREHENSIVE METABOLIC PANEL
ALT: 35 U/L (ref 0–35)
Albumin: 3.8 g/dL (ref 3.5–5.2)
Alkaline Phosphatase: 56 U/L (ref 39–117)
Alkaline Phosphatase: 69 U/L (ref 39–117)
BUN: 13 mg/dL (ref 6–23)
BUN: 18 mg/dL (ref 6–23)
CO2: 24 mEq/L (ref 19–32)
Calcium: 7.5 mg/dL — ABNORMAL LOW (ref 8.4–10.5)
Chloride: 106 mEq/L (ref 96–112)
Creatinine, Ser: 1.14 mg/dL (ref 0.4–1.2)
GFR calc non Af Amer: >60 mL/min (ref >60)
Glucose, Bld: 109 mg/dL — ABNORMAL HIGH (ref 70–99)
Glucose, Bld: 74 mg/dL (ref 70–99)
Potassium: 4.1 mEq/L (ref 3.5–5.1)
Sodium: 141 mEq/L (ref 135–145)
Total Bilirubin: 0.7 mg/dL (ref 0.3–1.2)
Total Protein: 4.7 g/dL — ABNORMAL LOW (ref 6.0–8.3)

## 2010-05-23 LAB — CK TOTAL AND CKMB (NOT AT ARMC)
CK, MB: 0.7 ng/mL (ref 0.3–4.0)
Relative Index: INVALID (ref 0.0–2.5)
Total CK: 54 U/L (ref 7–177)
Total CK: 65 U/L (ref 7–177)

## 2010-05-23 LAB — LIPID PANEL
HDL: 50 mg/dL (ref >39)
Triglycerides: 56 mg/dL (ref <150)
VLDL: 11 mg/dL (ref 0–40)

## 2010-05-23 LAB — TROPONIN I
Troponin I: 0.01 ng/mL (ref 0.00–0.06)
Troponin I: <0.01 ng/mL (ref 0.00–0.06)

## 2010-05-23 LAB — D-DIMER, QUANTITATIVE: D-Dimer, Quant: 0.48 ug/mL-FEU (ref 0.00–0.48)

## 2010-05-23 LAB — HEMOGLOBIN A1C: Hgb A1c MFr Bld: 5.6 % (ref <5.7)

## 2010-06-02 NOTE — Cardiovascular Report (Signed)
Summary: Office Visit   Office Visit   Imported By: Roderic Ovens 05/24/2010 14:12:10  _____________________________________________________________________  External Attachment:    Type:   Image     Comment:   External Document

## 2010-06-02 NOTE — Letter (Signed)
Summary: Remote Device Check  [facility] [employer], Main Office  [address]   [place], [place] [address]   Phone: [phone]  Fax: [phone]     May 23, 2010 MRN: [id number]   Katrina Rodriguez [address] [place], [place]  [address]   Dear [name],   Your remote transmission was recieved and reviewed by your physician.  All diagnostics were within normal limits for you.  __X___Your next transmission is scheduled for: 08-04-2010.  Please transmit at any time this day.  If you have a wireless device your transmission will be sent automatically.   Sincerely,  Vella Kohler

## 2010-06-15 ENCOUNTER — Emergency Department (HOSPITAL_COMMUNITY): Payer: Medicare Other

## 2010-06-15 ENCOUNTER — Emergency Department (HOSPITAL_COMMUNITY)
Admission: EM | Admit: 2010-06-15 | Discharge: 2010-06-15 | Disposition: A | Discharge status: Home / Self Care | Payer: Medicare Other | Attending: Emergency Medicine | Admitting: Emergency Medicine

## 2010-06-15 DIAGNOSIS — R0989 Other specified symptoms and signs involving the circulatory and respiratory systems: Secondary | ICD-10-CM | POA: Insufficient documentation

## 2010-06-15 DIAGNOSIS — Z950 Presence of cardiac pacemaker: Secondary | ICD-10-CM | POA: Insufficient documentation

## 2010-06-15 DIAGNOSIS — I4891 Unspecified atrial fibrillation: Secondary | ICD-10-CM | POA: Insufficient documentation

## 2010-06-15 DIAGNOSIS — R0602 Shortness of breath: Secondary | ICD-10-CM | POA: Insufficient documentation

## 2010-06-15 DIAGNOSIS — Z8679 Personal history of other diseases of the circulatory system: Secondary | ICD-10-CM | POA: Insufficient documentation

## 2010-06-15 DIAGNOSIS — R002 Palpitations: Secondary | ICD-10-CM | POA: Insufficient documentation

## 2010-06-15 DIAGNOSIS — R0609 Other forms of dyspnea: Secondary | ICD-10-CM | POA: Insufficient documentation

## 2010-06-15 DIAGNOSIS — F411 Generalized anxiety disorder: Secondary | ICD-10-CM | POA: Insufficient documentation

## 2010-06-15 DIAGNOSIS — R079 Chest pain, unspecified: Secondary | ICD-10-CM | POA: Insufficient documentation

## 2010-06-15 LAB — CARDIAC PANEL(CRET KIN+CKTOT+MB+TROPI)
Relative Index: 2.3 (ref 0.0–2.5)
Relative Index: 4.4 — ABNORMAL HIGH (ref 0.0–2.5)
Total CK: 120 U/L (ref 7–177)
Troponin I: 1.08 ng/mL (ref 0.00–0.06)
Troponin I: 1.58 ng/mL (ref 0.00–0.06)
Troponin I: 2.5 ng/mL (ref 0.00–0.06)

## 2010-06-15 LAB — BASIC METABOLIC PANEL
BUN: 9 mg/dL (ref 6–23)
BUN: 20 mg/dL (ref 6–23)
CO2: 25 mEq/L (ref 19–32)
CO2: 25 mEq/L (ref 19–32)
Calcium: 7.5 mg/dL — ABNORMAL LOW (ref 8.4–10.5)
Calcium: 8.6 mg/dL (ref 8.4–10.5)
Chloride: 110 mEq/L (ref 96–112)
Creatinine, Ser: 0.81 mg/dL (ref 0.4–1.2)
GFR calc Af Amer: >60 mL/min (ref >60)
GFR calc non Af Amer: >60 mL/min (ref >60)
Glucose, Bld: 124 mg/dL — ABNORMAL HIGH (ref 70–99)
Glucose, Bld: 97 mg/dL (ref 70–99)
Potassium: 3.3 mEq/L — ABNORMAL LOW (ref 3.5–5.1)
Potassium: 3.6 mEq/L (ref 3.5–5.1)
Sodium: 140 mEq/L (ref 135–145)
Sodium: 138 mEq/L (ref 135–145)

## 2010-06-15 LAB — PROTIME-INR
INR: 2 — ABNORMAL HIGH (ref 0.00–1.49)
INR: 2.9 — ABNORMAL HIGH (ref 0.00–1.49)
Prothrombin Time: 24.3 seconds — ABNORMAL HIGH (ref 11.6–15.2)
Prothrombin Time: 32.7 seconds — ABNORMAL HIGH (ref 11.6–15.2)

## 2010-06-15 LAB — CBC
MCH: 30.9 pg (ref 26.0–34.0)
Platelets: 136 10*3/uL — ABNORMAL LOW (ref 150–400)
RBC: 4.57 MIL/uL (ref 3.87–5.11)

## 2010-06-15 LAB — POCT CARDIAC MARKERS
Myoglobin, poc: 48.2 ng/mL (ref 12–200)
Troponin i, poc: <0.05 ng/mL (ref 0.00–0.09)

## 2010-06-15 LAB — MAGNESIUM: Magnesium: 2.3 mg/dL (ref 1.5–2.5)

## 2010-06-15 LAB — GLUCOSE, CAPILLARY: Glucose-Capillary: 96 mg/dL (ref 70–99)

## 2010-06-16 LAB — POCT I-STAT, CHEM 8
BUN: 18 mg/dL (ref 6–23)
Calcium, Ion: 1.08 mmol/L — ABNORMAL LOW (ref 1.12–1.32)
Creatinine, Ser: 1.3 mg/dL — ABNORMAL HIGH (ref 0.4–1.2)
Glucose, Bld: 84 mg/dL (ref 70–99)
TCO2: 31 mmol/L (ref 0–100)

## 2010-06-16 LAB — CBC
HCT: 38.6 % (ref 36.0–46.0)
Hemoglobin: 13.5 g/dL (ref 12.0–15.0)
MCHC: 34.9 g/dL (ref 30.0–36.0)
RDW: 13 % (ref 11.5–15.5)

## 2010-06-16 LAB — DIFFERENTIAL
Basophils Absolute: 0.1 10*3/uL (ref 0.0–0.1)
Basophils Relative: 1 % (ref 0–1)
Eosinophils Relative: 1 % (ref 0–5)
Monocytes Absolute: 0.6 10*3/uL (ref 0.1–1.0)

## 2010-06-16 LAB — TROPONIN I: Troponin I: <0.01 ng/mL (ref 0.00–0.06)

## 2010-06-17 ENCOUNTER — Encounter: Payer: Self-pay | Admitting: *Deleted

## 2010-06-20 ENCOUNTER — Ambulatory Visit (INDEPENDENT_AMBULATORY_CARE_PROVIDER_SITE_OTHER): Payer: Medicare Other | Admitting: *Deleted

## 2010-06-20 DIAGNOSIS — Z7901 Long term (current) use of anticoagulants: Secondary | ICD-10-CM

## 2010-06-20 DIAGNOSIS — I4891 Unspecified atrial fibrillation: Secondary | ICD-10-CM

## 2010-06-20 LAB — POCT INR: INR: 3.1

## 2010-06-21 LAB — MAGNESIUM: Magnesium: 2.5 mg/dL (ref 1.5–2.5)

## 2010-06-21 LAB — DIFFERENTIAL
Basophils Absolute: 0 10*3/uL (ref 0.0–0.1)
Lymphocytes Relative: 25 % (ref 12–46)
Monocytes Absolute: 0.6 10*3/uL (ref 0.1–1.0)
Monocytes Relative: 10 % (ref 3–12)
Neutro Abs: 3.6 10*3/uL (ref 1.7–7.7)
Neutrophils Relative %: 63 % (ref 43–77)

## 2010-06-21 LAB — BASIC METABOLIC PANEL
Calcium: 8.3 mg/dL — ABNORMAL LOW (ref 8.4–10.5)
GFR calc Af Amer: >60 mL/min (ref >60)
GFR calc non Af Amer: >60 mL/min (ref >60)
Sodium: 140 mEq/L (ref 135–145)

## 2010-06-21 LAB — POCT CARDIAC MARKERS
Myoglobin, poc: 62 ng/mL (ref 12–200)
Troponin i, poc: <0.05 ng/mL (ref 0.00–0.09)

## 2010-06-21 LAB — CARDIAC PANEL(CRET KIN+CKTOT+MB+TROPI)
CK, MB: 1.4 ng/mL (ref 0.3–4.0)
Relative Index: INVALID (ref 0.0–2.5)
Relative Index: INVALID (ref 0.0–2.5)
Relative Index: INVALID (ref 0.0–2.5)
Total CK: 60 U/L (ref 7–177)
Total CK: 77 U/L (ref 7–177)
Troponin I: <0.01 ng/mL (ref 0.00–0.06)
Troponin I: <0.01 ng/mL (ref 0.00–0.06)

## 2010-06-21 LAB — LIPID PANEL
Cholesterol: 181 mg/dL (ref 0–200)
HDL: 35 mg/dL — ABNORMAL LOW (ref >39)
Total CHOL/HDL Ratio: 5.2 RATIO

## 2010-06-21 LAB — DIGOXIN LEVEL: Digoxin Level: 1.2 ng/mL (ref 0.8–2.0)

## 2010-06-21 LAB — CK TOTAL AND CKMB (NOT AT ARMC)
CK, MB: 1.6 ng/mL (ref 0.3–4.0)
Relative Index: INVALID (ref 0.0–2.5)

## 2010-06-21 LAB — CBC
Hemoglobin: 14.3 g/dL (ref 12.0–15.0)
RBC: 4.34 MIL/uL (ref 3.87–5.11)

## 2010-06-21 LAB — PROTIME-INR: INR: 1 (ref 0.00–1.49)

## 2010-07-18 ENCOUNTER — Ambulatory Visit (INDEPENDENT_AMBULATORY_CARE_PROVIDER_SITE_OTHER): Payer: Medicare Other | Admitting: *Deleted

## 2010-07-18 ENCOUNTER — Ambulatory Visit (INDEPENDENT_AMBULATORY_CARE_PROVIDER_SITE_OTHER): Payer: Medicare Other | Admitting: Cardiology

## 2010-07-18 ENCOUNTER — Encounter: Payer: Self-pay | Admitting: Cardiology

## 2010-07-18 DIAGNOSIS — R55 Syncope and collapse: Secondary | ICD-10-CM

## 2010-07-18 DIAGNOSIS — I4891 Unspecified atrial fibrillation: Secondary | ICD-10-CM

## 2010-07-18 DIAGNOSIS — Z8673 Personal history of transient ischemic attack (TIA), and cerebral infarction without residual deficits: Secondary | ICD-10-CM

## 2010-07-18 DIAGNOSIS — Z7901 Long term (current) use of anticoagulants: Secondary | ICD-10-CM

## 2010-07-18 LAB — POCT INR: INR: 2.8

## 2010-07-18 NOTE — Assessment & Plan Note (Signed)
She is on chronic anticoagulation with Coumadin. We will check an INR today.

## 2010-07-18 NOTE — Assessment & Plan Note (Signed)
She is status post ablation in April 2010. She has had no documented recurrence of atrial fibrillation. She has been off of her Tikosyn since February. We will monitor by pacemaker checks. She is on chronic anticoagulation.

## 2010-07-18 NOTE — Progress Notes (Signed)
   Mikhala P Fly Date of Birth: May 29, 1928   History of Present Illness: Katrina Rodriguez is seen for followup today. In general she has done well. She still very limited by her eyesight. She reports that her blood pressure has been fluctuating quite a bit and was down as low as 80 systolic yesterday. She's had no recurrent syncope. One week ago she was walking in the mall and states she got very short of breath and felt her heart pounding but she has had nose sustained tachycardia. She denies any chest pain. She is scheduled for her pacemaker checked in the next couple of weeks.  Current Outpatient Prescriptions on File Prior to Visit  Medication Sig Dispense Refill  . aspirin 81 MG tablet Take 81 mg by mouth daily.        . fludrocortisone (FLORINEF) 0.1 MG tablet Take 0.2 mg by mouth 2 (two) times daily.        . simvastatin (ZOCOR) 10 MG tablet Take 10 mg by mouth at bedtime.        Marland Kitchen warfarin (COUMADIN) 6 MG tablet Take 6 mg by mouth as directed.        . dofetilide (TIKOSYN) 250 MCG capsule Take 250 mcg by mouth 2 (two) times daily.          Allergies  Allergen Reactions  . Amoxicillin   . Codeine   . Darvocet (Propoxyphene N-Acetaminophen)   . Demerol   . Penicillins     Past Medical History  Diagnosis Date  . Atrial fibrillation   . Thrombocytopenia   . H/O: CVA (cardiovascular accident)     right internal capsule  . Orthostasis   . Anticoagulant long-term use   . Pacemaker   . Tachycardia-bradycardia syndrome   . Mild pulmonary hypertension   . Chronic anemia   . Syncope, cardiogenic   . Chest pain   . Palpitation   . Hyperthyroidism   . Retinal detachment     Past Surgical History  Procedure Date  . Cardiac pacemaker placement   . Cardiac catheterization   . Retinal detachment surgery     History  Smoking status  . Never Smoker   Smokeless tobacco  . Never Used    History  Alcohol Use No    Family History  Problem Relation Age of Onset  . Heart disease  Father   . Heart disease Brother   . Heart disease Brother     Review of Systems: The review of systems is positive for fluctuating blood pressure.  She has intermittent pounding in her heart and some dizziness. She has had no recurrent TIA or CVA symptoms.All other systems were reviewed and are negative.  Physical Exam: BP 144/88  Pulse 80  Ht 5\' 5"  (1.651 m)  Wt 112 lb (50.803 kg)  BMI 18.64 kg/m2 She is a pleasant elderly white female in no acute distress. 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 Her oropharynx is clear. Neck is supple no JVD or bruits. Lungs are clear. Cardiac exam reveals a regular rate and rhythm without gallop or murmur. Abdomen is soft and nontender. She has no significant edema. She has good coordination and strength today. LABORATORY DATA: INR is pending today.  Assessment / Plan:

## 2010-07-18 NOTE — Patient Instructions (Signed)
Continue your current medications.  We will check your coumadin today.  We will schedule a follow up office visit in 3 months.

## 2010-07-18 NOTE — Assessment & Plan Note (Signed)
This is multifactorial related to prior orthostatic hypotension, arrhythmias, and probably a component of neurocardiogenic syncope. She is not able to drive now because of her vision. She has not had any recent episodes of syncope. We will continue with Florinef.

## 2010-07-19 NOTE — Discharge Summary (Signed)
NAME:  Katrina Rodriguez, Katrina Rodriguez                   ACCOUNT NO.:  [id number]   MEDICAL RECORD NO.:  [id number]          PATIENT TYPE:  AMB   LOCATION:  ENDO                         FACILITY:  [facility]   PHYSICIAN:  Hillis Range, MD       DATE OF BIRTH:  09-Jul-1928   DATE OF ADMISSION:  06/15/2008  DATE OF DISCHARGE:  06/15/2008                               DISCHARGE SUMMARY   The time of this dictation greater than 45 minutes.   FINAL DIAGNOSES:  1. Paroxysmal atrial fibrillation   SECONDARY DIAGNOSES:  1. History of syncope.  2. Dual-chamber pacemaker for tachybrady syndrome implanted in 2004.   PROCEDURE:  On June 16, 2008, electrophysiology study  and  radiofrequency ablation for atrial fibrillation   BRIEF HISTORY:  Katrina Rodriguez is a 77 year old female.  She has paroxysmal  atrial fibrillation.  This is extremely symptomatic and that the patient  has marked chest pain when she is in atrial fibrillation.   The patient had been treated previously with amiodarone.  She developed  thyroiditis on this medication and was discontinued.  She was  subsequently placed on dofetilide.  She has been maintained on this  medication for some time.   The patient has had frequent episodes of syncope which were felt to be  vasovagal in nature.  The patient is on Florinef for this, 0.1 mg twice  daily.   She also has a history of tachybrady syndrome.  She had implantation of  a Medtronic dual-chamber pacemaker implanted by Dr. Graciela Husbands in 2004.  In  the past, she has been intolerant to ventricular pacing.  Her device has  been programmed to ADI.   HOSPITAL COURSE:  The patient present ed and underwent EP study and  ablation for atrial fibrillation.  She tolerated her procedure well,  without complicaiton.  On post procedure day one, she had brief atrial  fibrillation which spontaneously terminated  She was continued on her  home medicine regimen at time of discharge.   DISCHARGE MEDICATIONS:  1. Stop  digoxin.  2. Atenolol 25 mg twice daily.  This is a new dose.  She was      originally on 50 mg twice daily.  3. Tikosyn 250 mcg twice daily.  4. Potassium chloride 20 mEq twice daily.  5. Florinef 0.1 mg twice daily.  6. Coumadin 2.5 mg daily.  7. Protonix 40 mg 2 tablets every morning for the next 6 weeks.   She follows up with [facility] office to see Dr.  Johney Frame on Friday, September 18, 2008, at 1:45.  She is to call our office at  [phone] if she has any problems such as recurrence of her atrial  fibrillation.   LABORATORY STUDIES:  Troponin I studies were taken when the patient  exhibited her atrial fibrillation with chest pain.  Troponin-I 2.50, 8  hours later 1.5, 8 hours later 1.08.  These are thought to be secondary  to her ablation procedure and not to ischemia.  Her basic metabolic  panel on the day of discharge, sodium 139, potassium  3.3 which is low,  chloride 110, carbonate 25, BUN is 9, creatinine 0.69, glucose 124,  protime 32.7, INR 2.9.   The patient has been advised to take increased amount of potassium to  make up for her low potassium.  Since on Tikosyn, we would like her  potassium greater than 4.  The patient claims that she can only take  these at home and she is eating her home diet, otherwise she has severe  stomach distress.      Maple Mirza, [place]      Hillis Range, MD  Electronically Signed    GM/MEDQ  D:  06/18/2008  T:  06/19/2008  Job:  [id number]   cc:   [name], M.D.  Dario Guardian, M.D.

## 2010-07-19 NOTE — Op Note (Signed)
NAME:  Katrina Rodriguez, Katrina Rodriguez NO.:  [id number]   MEDICAL RECORD NO.:  [id number]          PATIENT TYPE:  INP   LOCATION:  2924                         FACILITY:  [facility]   PHYSICIAN:  Hillis Range, MD       DATE OF BIRTH:  22-Jan-1929   DATE OF PROCEDURE:  06/16/2008  DATE OF DISCHARGE:                               OPERATIVE REPORT   SURGEON:  Hillis Range, MD   ASSISTANT:  Doylene Canning. Ladona Ridgel, MD   PREPROCEDURE DIAGNOSIS:  Paroxysmal atrial fibrillation.   POSTPROCEDURE DIAGNOSES:  Paroxysmal atrial fibrillation.   PROCEDURES:  1. Comprehensive electrophysiologic study.  2. Coronary sinus pacing and recording.  3. Three-dimensional mapping of supraventricular tachycardia.  4. Radiofrequency ablation of supraventricular tachycardia.  5. Transseptal catheterization.  6. Intracardiac echocardiography.  7. Pulmonary vein venography.  8. Arterial blood pressure monitoring.  9. Isoproterenol infusion.   INTRODUCTION:  Ms. Selley is a pleasant 77 year old female with a history  of paroxysmal atrial fibrillation who presents today for EP study and  radiofrequency ablation.  She has previously been treated medically with  digoxin, atenolol, and dofetilide.  She reports increasing frequency and  duration of symptomatic episodes of atrial fibrillation despite medical  therapy.  She is highly symptomatic with atrial fibrillation and  therefore presents today for EP study and radiofrequency ablation.   DESCRIPTION OF PROCEDURE:  Informed written consent was obtained, and  the patient was brought to the electrophysiology lab in a fasting state.  She was adequately sedated with intravenous medications as outlined in  the anesthesia report.  The patient's right and left groins were prepped  and draped in the usual sterile fashion by the EP lab staff.  Using a  percutaneous Seldinger technique, one 6, one 7, and one 8-French  hemostasis sheaths were placed into the right common  femoral vein.  A 4-  [place] hemostasis sheath was placed in the right common femoral artery  for blood pressure monitoring.  A 11-French hemostasis sheath was placed  into the left common femoral vein.  A 6-French decapolar Polaris X  catheter was introduced through the right common femoral vein and  advanced into the coronary sinus for recording and pacing from this  location.  A 6-French quadripolar Josephson catheter was introduced  through the right common femoral vein and advanced into the right  ventricle for recording and pacing.  This catheter was then pulled back  to the His bundle location.  The patient presented to the  electrophysiology lab in normal sinus rhythm.  Her RR interval measured  1160 milliseconds with a PR interval of 183 milliseconds, QRS duration  of 100 milliseconds, and QT interval of 499 milliseconds.  Her AH  interval measured 98 milliseconds with an HV interval of 39  milliseconds.  Ventricular pacing revealed midline decremental VA  conduction with a VA Wenckebach cycle length of 450 milliseconds with no  arrhythmias induced.  A 10-French Biosense Webster SoundStar  intracardiac echocardiography catheter was introduced through the left  common femoral vein and advanced into the right atrium. Intracardiac  echocardiography  was performed within the right atrium to evaluate the  left atrium and pulmonary veins.  This demonstrated a moderate-sized  left atrium with small pulmonary veins.  All 4 pulmonary veins had  separate ostia.  The middle right common femoral vein sheath was then  exchanged for an 8.5 [place] SL2 transseptal sheath and transseptal  access was achieved in a standard fashion using a Brockenbrough needle  under biplane fluoroscopy with intracardiac echo for transseptal  puncture visualization.  The patient's atrial and ventricular pacing  leads provided a physical barrier for movement of the transseptal  catheter within the right atrium.  The  patient was also noted to have  mild stenosis within the superior vena cava making it difficult to  advance the transseptal sheath into the superior vena cava before  pulling it back down into the region of the fossa ovalis.  The  transseptal puncture was therefore an extended procedure requiring  approximately 20 minutes of fluoroscopy.  Once transseptal access had  been achieved, heparin was administered intravenously and intra-  arterially in order to maintain an ACT of greater than 400 seconds  throughout the procedure.  A 6-French multipurpose angiographic catheter  with guidewire was introduced through the transseptal sheath and  positioned over the mouth of all 4 pulmonary veins.  Pulmonary venograms  were performed by hand injection of nonionic contrast and demonstrated  small-to-moderate sized pulmonary veins measuring approximately 12-17 mm  in size.  The angiographic catheter was then removed.  The His bundle  catheter was removed and in its place a 3.5 mm [employer] [place] [employer]  ThermoCool cervical ablation catheter was advanced into the right  atrium.  The transseptal sheath was pulled back into the IVC over a  guidewire.  The ablation catheter was advanced across the transseptal  hole using the wires as guide.  The transseptal sheath was then re-  advanced over the guidewire into the left atrium.  A 20-pole, 20-mm  circular mapping catheter was introduced through the transseptal sheath  and positioned at the ostium of all 4 pulmonary veins.  The patient then  underwent successful, sequential electrical isolation and anatomical  encircling of all 4 pulmonary veins using radiofrequency current with  the circular mapping catheter as a guide.  During ablation at the carina  between the left superior and left inferior pulmonary veins, the patient  was noted to have a significant increase.  Following ablation,  isoproterenol was infused up to 6 mcg per minute with an adequate   acceleration in heart rate with no PACs, atrial flutter, or atrial  fibrillation induced.  Isoproterenol was allowed to wash out with only  rare premature atrial contractions arising from the left atrium  observed.  These PVCs were too rare to be electroanatomically mapped.  Rapid atrial pacing was then performed from the distal coronary sinus  catheter which revealed an AV Wenckebach cycle length of 360  milliseconds.  Rapid atrial pacing was performed down to a cycle length  of 200 milliseconds with no sustained arrhythmias observed.  The  procedure was therefore considered completed.  Intracardiac  echocardiography was then again used to assess the pericardial space,  and there was no evidence of pericardial effusion.  There were no early  apparent complications.  The patient was transferred to the short-stay  area for sheath removal per protocol.  A limited bedside transthoracic  echocardiogram also revealed no pericardial effusion.   CONCLUSIONS:  1. Normal sinus rhythm upon presentation.  2. Successful  electrical isolation and anatomical encircling of all 4      pulmonary veins using radiofrequency current.  3. No inducible arrhythmias following ablation with rapid atrial      pacing or isoproterenol infusion.  4. No early apparent complications.      Hillis Range, MD  Electronically Signed    JA/MEDQ  D:  06/16/2008  T:  06/17/2008  Job:  [id number]   cc:   [name], M.D.

## 2010-07-19 NOTE — Op Note (Signed)
NAME:  Katrina Rodriguez, Katrina Rodriguez NO.:  [id number]   MEDICAL RECORD NO.:  [id number]          PATIENT TYPE:  INP   LOCATION:  2924                         FACILITY:  [facility]   PHYSICIAN:  Hillis Range, MD       DATE OF BIRTH:  25-May-1928   DATE OF PROCEDURE:  DATE OF DISCHARGE:                               OPERATIVE REPORT   SURGEON:  Hillis Range, MD   FIRST ASSISTANT:  Doylene Canning. Ladona Ridgel, MD   PREPROCEDURE DIAGNOSIS:  Paroxysmal atrial fibrillation.   POSTPROCEDURE DIAGNOSIS:  Paroxysmal atrial fibrillation.   PROCEDURES:  1. Comprehensive electrophysiologic study.  2. Coronary sinus pacing and recording.  3. Three-dimensional mapping of supraventricular tachycardia.  4. Ablation of supraventricular tachycardia.  5. Isoproterenol infusion.  6. Transseptal catheterization.  7. Pulmonary vein venography.  8. Intracardiac echocardiography.  9. Arterial blood pressure monitoring.   INTRODUCTION:  Katrina Rodriguez is a pleasant 77 year old female with paroxysmal  atrial fibrillation who presents today for EP study and radiofrequency  ablation.  She has previously failed medical therapy with digoxin,  atenolol and Tikosyn.  She has highly symptomatic atrial fibrillation  and therefore presents today for EP study and radiofrequency ablation.   DESCRIPTION OF PROCEDURE:  Informed written consent was obtained and the  patient was brought to the Electrophysiology Lab in the fasting state.  She was adequately sedated with intravenous medications as outlined in  the anesthesia report.  The patient's right and left groins were prepped  and draped in the usual sterile fashion by the EP Lab staff.  Using a  percutaneous Seldinger technique, one 6, one 7 and one 8 [place]  hemostasis sheaths were placed into the right common femoral vein.  A 4-  [place] hemostasis sheath was placed in the right common femoral artery  for blood pressure monitoring.  An 11-French hemostasis sheath was  placed  in the left common femoral vein. A 6-French decapolar Polaris X  catheter was introduced through the right common femoral vein and  advanced into the coronary sinus for recording and pacing from this  location.  A 6-French quadripolar Josephson catheter was introduced  through the right common femoral vein and advanced into the right  ventricle for recording and pacing.  This catheter was then pulled back  to the His bundle location.  The patient presented to the  Electrophysiology Lab in normal sinus rhythm.  Her RR interval measured  1160 milliseconds with a PR interval of 183 milliseconds, QRS duration  100 milliseconds, and QT interval 499 milliseconds.  Her AH interval  measured 98 milliseconds with an HV interval of 39 milliseconds.  Ventricular pacing was performed which revealed midline decremental VA  conduction with a VA Wenckebach cycle length of 450 milliseconds and no  tachycardias induced.  Intracardiac echocardiography was performed using  a [employer] intracardiac echocardiography catheter.  This demonstrated a moderate-sized left atrium with small to moderate  pulmonary veins.  A three-dimensional reconstruction of the left atrium  was performed using CartoSound technology.  The middle right common  femoral vein sheath was  exchanged for an 8.5 [place] SL2 transseptal  sheath and transseptal access was achieved in a standard fashion using a  Brockenbrough needle under biplane fluoroscopy with intracardiac  echocardiography for visualization of the transseptal puncture.  The  patient was noted to have a very small right atrium with a right atrial  and right ventricular pacing leads in place.  This made transseptal  access very difficult.  The patient was also noted to have some stenosis  within the superior vena cava making it difficult to achieve a standard  approach to the transseptal puncture.  This extended and cumbersome  transseptal procedure required  approximately 20 minutes of fluoroscopy  time.  Once transseptal access had been achieved, heparin was  administered intravenously and intra-arterially in order to maintain an  ACT of greater than 400 seconds throughout the procedure.  A 6-French  multipurpose angiographic catheter with guidewire was introduced through  the transseptal sheath and positioned over the mouth of all four  pulmonary veins.  Pulmonary venograms were performed by hand injection  of nonionic contrast and demonstrated small to moderate size pulmonary  veins measuring 12-17 mm in size with no evidence of pulmonary vein  stenosis.  The angiographic catheter was then removed.  The His bundle  catheter was removed and in its place a 3.5 mm Biosense Webster [employer] [place]  ThermoCool ablation catheter was advanced into the right atrium.  The  transseptal sheath was pulled back into the IVC over a guidewire.  The  ablation catheter was advanced across the transseptal hole using the  wire as a guide.  The transseptal sheath was then re-advanced over the  guidewire into the left atrium.  A 20 pole 20-mm circular mapping  catheter was introduced through the transseptal sheath and positioned at  the mouth of all four pulmonary veins.  Three-dimensional  electroanatomical mapping was performed using [employer].  This demonstrated electrical activity within all four pulmonary veins at  baseline.  The patient then underwent successful sequential electrical  isolation and anatomical encircling of all four pulmonary veins using  radiofrequency current with a circular mapping catheter as a guide.  During ablation at the carina between the left superior and left  inferior pulmonary vein, the patient was noted to have very frequent  PACs.  These immediately terminated with ablation in this location.  Following ablation, isoproterenol was infused up to 6 mcg per minute  with an adequate heart rate response achieved.  The patient  had no PACs,  atrial fibrillation, or atrial flutter with isoproterenol infusion.  Isoproterenol was allowed to wash out and the patient had very rare  premature atrial contractions arising from the left atrium.  These were  too infrequent to be electroanatomically mapped.  Rapid atrial pacing  was then performed which revealed no evidence of PR greater than RR.  The AV Wenckebach cycle length was 360 milliseconds.  Rapid atrial  pacing was performed down to a cycle length of 200 milliseconds with no  sustained arrhythmias induced.  The procedure was therefore considered  completed.  A surveillance intracardiac echocardiogram was performed  which revealed no pericardial effusion.  All catheters were removed and  the sheaths were aspirated and flushed.  The patient was transferred to  the step-down area for sheath removal per protocol.  A limited bedside  transthoracic echocardiogram revealed no pericardial effusion.  There  were no early apparent complications.   CONCLUSIONS:  1. Normal sinus rhythm upon presentation.  2. Successful electrical isolation  and anatomical encircling of all      four pulmonary veins using radiofrequency current.  3. No inducible arrhythmias following ablation with isoproterenol      infusion and rapid atrial pacing down to a cycle length of 200      milliseconds.  4. No early apparent complications.      Hillis Range, MD  Electronically Signed     JA/MEDQ  D:  06/16/2008  T:  06/17/2008  Job:  [id number]   cc:   [name], M.D.

## 2010-07-19 NOTE — Letter (Signed)
March 24, 2009    [name], M.D.  [address]  [place], [place] [address]   RE:  Katrina Rodriguez, Katrina Rodriguez  MRN:  [id number]  /  DOB:  June 20, 1928   Dear Theron Arista:   It was my pleasure to see [name] in Electrophysiology Clinic for  followup today.  As you would recall, Ms. Luddy is a very pleasant 77-  year-old female with a history of paroxysmal atrial fibrillation, for  which she was previously very symptomatic and has had multiple emergency  room visits.  She failed medical therapy with Tikosyn and therefore  underwent atrial fibrillation ablation by me on June 16, 2008.  She has  done very well since that time and has had no further episodes of atrial  fibrillation.  I interrogated her pacemaker today and found that she has  had indeed no episodes of a AFib.  The patient continues Tikosyn.  I did  offer her the option of stopping Tikosyn at this time.  However, she is  very reluctant and would like to discuss this further with you.  I think  that, it would certainly be reasonable to discontinue Tikosyn however,  she was so symptomatic with her AFib previously that I understand her  reluctance to stop this medicine.  I have returned the entirety of her  care back to you at this time.  If she develops further episodes of  atrial fibrillation, I would like to assist with her care at that time.  Please feel free to contact me with any questions.   Thank you for the opportunity of participating in the care of [name].    Sincerely,     Hillis Range, MD   JA/MedQ  DD: 03/24/2009  DT: 03/25/2009  Job #: [id number]

## 2010-07-19 NOTE — Procedures (Signed)
CAROTID DUPLEX EXAM   INDICATION:  Left side weakness.   HISTORY:  Diabetes:  No.  Cardiac:  Pacemaker.  Hypertension:  No.  Smoking:  No.  Previous Surgery:  No carotid surgery.  CV History:  Left arm / head / chest weakness multiple times with the  last time being approximately 3 weeks ago.  Minimal weakness in left  ACF area still.  Amaurosis Fugax No, Paresthesias No, Hemiparesis ?                                       RIGHT             LEFT  Brachial systolic pressure:         128               132  Brachial Doppler waveforms:         WNL               WNL  Vertebral direction of flow:        Antegrade         Antegrade  DUPLEX VELOCITIES (cm/sec)  CCA peak systolic                   90                93  ECA peak systolic                   113               95  ICA peak systolic                   78                79  ICA end diastolic                   26                22  PLAQUE MORPHOLOGY:                  Homogeneous       Calcific  PLAQUE AMOUNT:                      Mild              Focal mild  PLAQUE LOCATION:                    Bifurcation / proximal ICA          ICA   IMPRESSION:  1. Right internal carotid artery shows evidence of 1-39% stenosis with      minimal homogeneous plaque noted in the bifurcation and proximal      internal carotid artery.  2. Left internal carotid artery shows evidence of 1-39% stenosis with      a focal minimal area of calcific plaque noted in the proximal      internal carotid artery.        ___________________________________________  Katrina Hora Fields, MD   AS/MEDQ  D:  05/19/2009  T:  05/19/2009  Job:  [id number]

## 2010-07-19 NOTE — Letter (Signed)
May 08, 2008    Katrina M. Swaziland, MD  [phone] [address]  [place], [place] [address]   RE:  Katrina Rodriguez, Katrina Rodriguez  MRN:  [id number]  /  DOB:  22-Oct-1928   Dear Katrina Rodriguez:   It my pleasure to see Katrina Rodriguez in electrophysiology followup today.  As  you recall, she is a pleasant 77 year old female with a history of  paroxysmal atrial fibrillation.  She has episodic chest pain during  episodes of atrial fibrillation.  She also has tachycardia-bradycardia  syndrome, for which she had a pacemaker implanted in 2004.  She presents  today for further discussion regarding therapeutic strategies for her  atrial fibrillation.  As you recall, she has previously failed medical  therapy with Rhythmol and has most recently been treated with Tikosyn.  She has had at least 2 hospitalizations over the past 6 months due to  symptomatic atrial fibrillation despite Tikosyn therapy.  She is  chronically anticoagulated with Coumadin and tolerates this quite well.  Previously attempts were made at beta-blockers and pacing; however, the  patient did not tolerate ventricular pacing due to symptoms of  palpitations and chest discomfort.  Upon recent evaluation in the  hospital in February 2010, consideration was made for catheter ablation.  She presents today to further contemplate this option.  She denies any  further symptomatic episodes of atrial fibrillation since being seen in  the hospital.  She denies chest pain, shortness of breath, orthopnea,  PND, palpitations, presyncope, or syncope.  She is otherwise without  complaint today.   PROBLEM LIST:  1. Paroxysmal atrial fibrillation.  2. Chest pain secondary to recurrent paroxysmal atrial fibrillation.  3. Tachycardia-bradycardia syndrome, status post pacemaker      implantation in 2004.  4. Neurocardiogenic syncope.  5. Left heart catheterization in 2005 revealed normal coronary      arteries with a preserved ejection fraction.  6. Electrophysiology study  performed on October 27, 1994, revealed      multiple atrial flutter circuits as well as atrial fibrillation      induced with electrophysiology study.  No ablation was performed at      that time.  Consideration for complex ablation of her atrial      fibrillation was recommended at that time, but not subsequently      performed.   ALLERGIES:  PENICILLIN, CODEINE, DARVOCET, MORPHINE, and AMIODARONE.   CURRENT MEDICATIONS:  1. Atenolol 50 mg b.i.d.  2. Digoxin 0.25 mg daily.  3. Potassium chloride 40 mEq daily.  4. Tikosyn 250 mcg b.i.d.  5. Florinef 0.1 mg b.i.d.  6. Coumadin to maintain an INR between 2 and 3.   PHYSICAL EXAMINATION:  VITAL SIGNS:  Blood pressure 140/80, heart rate  50, respirations 18, and weight 115 pounds.  GENERAL:  The patient is a thin elderly female in no acute distress.  She is alert and oriented x3.  She walks and talks without difficulty.  HEENT:  Normocephalic, atraumatic.  Sclerae are clear.  Conjunctivae are  pink.  Oropharynx clear.  NECK:  Supple.  No thyromegaly, JVD, or bruits.  LUNGS:  Clear to auscultation bilaterally.  HEART:  Regular rate and rhythm.  No murmurs, rubs, or gallops.  GI:  Soft, nontender, and nondistended.  Positive bowel sounds.  EXTREMITIES:  No clubbing, cyanosis, or edema.  NEUROLOGIC:  Strength and sensation are intact.  SKIN:  No ecchymoses or lacerations.  MUSCULOSKELETAL:  No deformity or atrophy.  PSYCH:  Euthymic mood, full  affect.   EKG today reveals an atrial paced rhythm at 50 beats per minute with  left ventricular hypertrophy and nonspecific ST/T-wave changes.   Transthoracic echocardiogram performed in your office April 20, 2008,  reveals a left ventricular end-diastolic dimension of 40, IVF 11, PW 10,  ejection fraction 55-60%, left atrial size 32 mm.  There was mild mitral  regurgitation with moderate tricuspid regurgitation.  Mild pulmonary  hypertension was noted.   Adenosine Myoview performed on  April 23, 2008, in your office reveals  an ejection fraction of 75% with normal perfusion.   IMPRESSION:  Katrina Rodriguez is a pleasant 77 year old female with symptomatic  paroxysmal atrial fibrillation, who presents today for further  electrophysiology consultation.  Therapeutic strategies for atrial  fibrillation including both medicine and catheter-based therapies were  again discussed with the patient in detail today.  Risks, benefits, and  alternatives to electrophysiology study and radiofrequency ablation were  also discussed.  These risk include, but are not limited to stroke,  bleeding, vascular damage, pericardial effusion with tamponade,  perforation, esophageal injury, damage to the lungs and surrounding  structures, and pulmonary vein stenosis.  The patient understands these  risks and wishes to proceed.  We will therefore schedule the patient for  an atrial fibrillation ablation at the next available time.  I have  taken the liberty of given the patient a short-acting Cardizem 60 mg,  which she can take should she have episodes of atrial fibrillation with  rapid ventricular rates in the interim on a p.r.n. basis.    Sincerely,      Hillis Range, MD  Electronically Signed    JA/MedQ  DD: 05/08/2008  DT: 05/09/2008  Job #: [id number]

## 2010-07-19 NOTE — H&P (Signed)
NAME:  Katrina Rodriguez, Katrina Rodriguez NO.:  [id number]   MEDICAL RECORD NO.:  [id number]           PATIENT TYPE:   LOCATION:                                 FACILITY:   PHYSICIAN:  Therisa Doyne, MD    DATE OF BIRTH:  1928-03-26   DATE OF ADMISSION:  DATE OF DISCHARGE:                              HISTORY & PHYSICAL   CHIEF COMPLAINT:  Chest pain and atrial fibrillation with rapid  ventricular response.   HISTORY OF PRESENT ILLNESS:  This is a 77 year old white female with a  past medical history significant for paroxysmal atrial fibrillation (not  on systemic anticoagulation), tachybrady syndrome, status post pacemaker  implantation, current settings are ADI, who presents with chest pain and  palpitations.  The patient awoke this morning with 10/10 substernal  chest pain that was described as a heavy sensation in her chest.  The  pain radiated to her left arm.  There was no associated shortness of  breath or nausea.  Because of these symptoms, she came to the emergency  department for evaluation.   In the emergency department, the patient's pacemaker was interrogated  because she thought that she may have had an episode of atrial  fibrillation with rapid ventricular response.  At the same time that her  symptoms started, it appeared that she went into atrial fibrillation  with rapid ventricular response which lasted for approximately 45  minutes.   With respect to the patient's other symptoms, she states that she gets  intermittent chest pains approximately 2-3 times per month.  Sometimes  they occur with exertion, sometimes they do not occur with exertion.  The majority of the time they are associated with some palpitations.  She also has a history of recurrent syncope which is felt to be from  orthostatic syncope.  The last time she passed out was 3 weeks ago.   REVIEW OF SYSTEMS:  All systems were reviewed and are negative except as  mentioned above in history of  present illness.   PAST MEDICAL HISTORY:  1. Tachybrady syndrome, status post pacemaker implantation in 2005      with [employer] pacemaker, current settings are ADI.  2. Paroxysmal atrial fibrillation.  She is not on anticoagulation      secondary to her fall risk and her CHADS2 score being extremely      low.  She has been on Tikosyn therapy for greater than 3 years.  3. History of cardiac catheterization 2005 which showed normal      coronary arteries with normal left ventricular function.  4. History of recurrent falls and syncope and orthostatic hypotension.   SOCIAL HISTORY:  The patient lives [place].  She denies any tobacco,  alcohol or drugs.   FAMILY HISTORY:  There is a family history of atrial fibrillation as  well as coronary disease in her sister.   ALLERGIES:  1. AMIODARONE.  2. AMOXICILLIN.  3. CODEINE.  4. DARVOCET.  5. MORPHINE.  6. PENICILLINS.   MEDICATIONS:  1. Atenolol 50 mg b.i.d.  2. Aspirin 81 mg daily  as needed.  3. Florinef 0.1 mg b.i.d.  4. Digoxin 0.25 mcg b.i.d.  5. Potassium chloride 20 mEq daily.  6. Tikosyn 250 mg b.i.d.   PHYSICAL EXAM:  VITAL SIGNS:  Temperature afebrile, blood pressure  130/70, pulse 63, respirations 69, oxygen saturation 100% on room air.  GENERAL:  In no acute distress.  HEENT:  Normocephalic atraumatic.  Pupils are equal, round and react to  light and accommodation.  Her extraocular movements are intact.  Oropharynx is pink and moist without any lesions.  NECK:  Supple.  No lymphadenopathy, no jugular venous distention, no  masses.  CARDIOVASCULAR:  Regular rate and rhythm.  No murmurs, rubs or  gallops.  CHEST:  Clear to auscultation bilaterally.  ABDOMEN:  Positive bowel sounds, soft, nontender, nondistended.  EXTREMITIES:  No clubbing, cyanosis or edema.  Dorsalis pedis pulses 2+  bilaterally.  SKIN:  No rashes.  BACK:  No CVA tenderness.   PERTINENT LABORATORY DATA:  Shows a normal CBC, troponin less than  0.05  x2.  BMP was notable for normal renal function with BUN of 12 and  creatinine 0.9.  She was hypokalemic with a potassium of 3.3.   DATA:  1. EKG shows normal sinus rhythm at 61 beats per minute and no ST or T-      wave changes.  No active signs of ischemia.  QTc is 423.  2. Chest x-ray shows no acute cardiopulmonary disease.   ASSESSMENT AND PLAN:  A 77 year old white female with a past medical  history significant for paroxysmal atrial fibrillation,  tachycardia/bradycardia syndrome, status post pacemaker implantation,  who presents for an evaluation of chest pain that occurred in the  setting of atrial fibrillation with rapid ventricular response.   1. We will admit patient to the [facility] Cardiology Service under      Dr. Cathren Laine care.   1. Chest pain:  I doubt this is an acute coronary syndrome or      secondary to coronary disease.  Most likely this is related to her      atrial fibrillation with rapid ventricular response.  Certainly, it      is reassuring that she had a cardiac catheterization performed in      2005 and this study showed no significant atherosclerotic coronary      disease.  However, we will treat her with aspirin 325 mg daily, and      continue her on her atenolol.  We will not use systemic      anticoagulation or 2B3A inhibitor unless she rules in for a      myocardial infarction.  We will monitor with serial cardiac      enzymes.  As she does have this history of chest pain with      exertion, I would recommend getting a stress test prior to      discharge and check risk factor profile including fasting lipid      panel.   1. Atrial fibrillation with rapid ventricular response:  Currently she      is in normal sinus rhythm.  However, she did have a prolonged      episode of A-fib with RVR which she was fairly symptomatic with.      She is not on anticoagulation because she is a fall risk from      orthostatic hypotension.  However, her  CHADS2 score is extremely      low.  Currently she is on an excellent  medical regimen with      atenolol 25 mg b.i.d., Lanoxin 0.25 mcg b.i.d. and Tikosy; we will      check a Lanoxin level.  As she continues to have breakthrough      episodes of A-fib with rapid ventricular response, I think that she      may benefit from an evaluation for possible catheter-based ablation      therapy.  I would recommend consulting electrophysiology in the      morning.  Certainly, her hypokalemia could have tipped her over to      atrial fibrillation with rapid ventricular response, and we will      aggressively replete her potassium as well as check a magnesium      level and a TSH.   1. Orthostatic hypotension:  Continue Florinef.   1. Fluids, electrolytes, and nutrition:  Saline lock, IV fluids,      n.p.o., replete potassium with potassium chloride supplementation.   1. Deep venous thrombophlebitis with subcutaneous heparin.      Therisa Doyne, MD  Electronically Signed     SJT/MEDQ  D:  04/14/2008  T:  04/14/2008  Job:  [id number]

## 2010-07-19 NOTE — Discharge Summary (Signed)
NAME:  Katrina Rodriguez, Katrina Rodriguez NO.:  [id number]   MEDICAL RECORD NO.:  [id number]          PATIENT TYPE:  INP   LOCATION:  2017                         FACILITY:  [facility]   PHYSICIAN:  [name], M.D.  DATE OF BIRTH:  December 23, 1928   DATE OF ADMISSION:  01/23/2008  DATE OF DISCHARGE:  01/24/2008                               DISCHARGE SUMMARY   HISTORY OF PRESENT ILLNESS:  Ms. Frazee is a 77 year old white female with  history of paroxysmal atrial fibrillation, recurrent syncope, and  history of chest pain.  The patient has had a previous pacemaker  implanted in 2004 for sick sinus syndrome.  She has been on Tikosyn  therapy for the past 3 years and has had no sustained episodes of atrial  fibrillation on that regimen.  She has had some brief intermittent  episodes.  On the day of admission, the patient complained of acute  onset of rapid heartbeat associated with severe chest discomfort and  shortness of breath.  This persisted and she presented to the emergency  room where she was found to be in atrial fibrillation with rapid  ventricle response.  Of note, the patient had a prior cardiac  catheterization in 2005, which showed no coronary disease.  The patient  has not felt to be a candidate for Coumadin due to recurrent syncopal  episodes that have been multifactorial including her tachybrady  syndrome, orthostatic hypotension, and a component of neurocardiogenic  syncope.  In fact she had fallen and had a vertebral fracture  approximately 1 month ago.  For this reason, she has not been on long-  term Coumadin therapy.  The patient was admitted for further management.   For details of her past medical history, social history, family history,  and physical exam, please see admission history and physical.   LABORATORY DATA:  ECG showed atrial fibrillation with rapid ventricular  response, rate of 113 beats per minute.  She had diffuse nonspecific ST-  T wave changes  consistent with dig effect.  White count 7500, hemoglobin  13.2, hematocrit 38.7, platelets 127,000.  LFTs and coags were normal.  Initially, potassium was low at 3.3 and this was repleted up to 4.1,  magnesium was 2.3.  Other electrolytes were normal.  Renal function was  normal.  Dig level was 0.4.  TSH was normal.  Serial cardiac enzymes  were all negative.   HOSPITAL COURSE:  The patient was admitted to telemetry monitoring.  She  was initially anticoagulated with heparin.  We increased her digoxin and  initially her atenolol for better rate control.  She was continued on  Tikosyn therapy.  Subsequent interrogation of her pacemaker demonstrates  that the onset of her atrial fibrillation was at 12:30 a.m. on January 23, 2008, and she had persistent atrial fibrillation for approximately  14 hours.  She subsequently converted to normal sinus rhythm and her  pacemaker settings were appropriate.  Based on these findings, we made  no changes in her device.  We increased her Lanoxin dose to 0.25 mg per  day.  In fact, this is her first episode of sustained atrial  fibrillation since she has been on Tikosyn for the past 3 years.  We  have recommended continuing this antiarrhythmic drug therapy.  She has  been intolerant of amiodarone in the past.  The patient's chest pain  resolved with resolution of her arrhythmia.  She was stable  hemodynamically, and she was discharged home on January 24, 2008.   DISCHARGE DIAGNOSES:  1. Atrial fibrillation with rapid ventricular response.  2. Tachybrady syndrome status post DDD pacemaker placement.  3. History of recurrent syncope and falls.  4. History of vertebral fracture.  5. History of retinal hemorrhage.  6. History of orthostatic hypotension.   DISCHARGE MEDICATIONS:  1. Tikosyn 250 mcg twice daily.  2. Lanoxin 0.25 mg daily.  3. Atenolol 50 mg twice a day.  4. Aspirin 81 mg per day.  5. Florinef 0.1 mg twice a day.  6. Potassium 20 mEq  per day.   The patient is instructed to increase her activities slowly.  She will  follow up in my office in 2 weeks.   DISCHARGE STATUS:  Improved.           ______________________________  [name], M.D.     PMJ/MEDQ  D:  01/24/2008  T:  01/24/2008  Job:  [id number]

## 2010-07-19 NOTE — H&P (Signed)
NAME:  Katrina Rodriguez, Katrina Rodriguez NO.:  [id number]   MEDICAL RECORD NO.:  [id number]          PATIENT TYPE:  INP   LOCATION:  1828                         FACILITY:  [facility]   PHYSICIAN:  Vernice Jefferson, MD          DATE OF BIRTH:  [birth year]   DATE OF ADMISSION:  01/23/2008  DATE OF DISCHARGE:                              HISTORY & PHYSICAL   PRIMARY CARDIOLOGIST:  Colleen Can. Deborah Chalk, MD   CHIEF COMPLAINT:  Chest pressure and rapid heartbeat.   HISTORY OF PRESENT ILLNESS:  The patient is a 77 year old white female  with a history of paroxysmal atrial fibrillation and several recent  admissions with the similar complaint, reports approximately midnight  tonight, she had an acute onset of rapid heartbeat associated with a  chest discomfort and shortness of breath.  That persisted for the next  hour.  She presented to the ED, received some diltiazem with resolution  of her chest discomfort.  She most recently presented back in March with  this similar complaint, but was not admitted at that time.  Currently,  she denies any shortness of breath, any chest discomfort, no PND, no  orthopnea.  Her heart rate is in the 70s, but still in atrial  fibrillation.   PAST MEDICAL HISTORY:  1. Paroxysmal atrial fibrillation on previous several different      antiarrhythmics, now on Tikosyn.  2. History of tachy-brady syndrome status, post dual-chamber      pacemaker.  3. History of hyperthyroidism.  4. Hypertension.   SOCIAL HISTORY:  She lives here in [place], lives alone.  No tobacco,  no alcohol, no drug abuse.   FAMILY HISTORY:  Reviewed and is noncontributory to the patient's  current medical condition.   ALLERGIES:  No known drug allergies.   MEDICATIONS:  1. Aspirin 81 mg a day.  2. Clonidine 0.2 mg b.i.d.  3. Atenolol 50 mg b.i.d.  4. Lanoxin 0.125 mg daily.  5. Tikosyn 250 mcg p.o. b.i.d.   REVIEW OF SYSTEMS:  Negative 11 point review of systems except for  otherwise  dictated in the above HPI.   PHYSICAL EXAMINATION:  VITAL SIGNS:  Blood pressure is 90/52 with heart  rate in 70s, respirations 12.  GENERAL:  A well-developed and well-nourished white female in no acute  distress.  HEENT:  Moist mucous membranes.  Sclerae anicteric.  Conjunctivae pale.  NECK:  Supple and full range of motion.  No jugular venous distention.  CARDIOVASCULAR:  Irregularly irregular.  No rubs, murmurs, or gallops.  CHEST:  Clear to auscultation bilaterally.  No wheezes, rales, rhonchi.  ABDOMEN:  Soft,  nontender, nondistended, normoactive bowel sounds.  EXTREMITIES:  No peripheral edema and pulses 2+ bilaterally.  NEUROLOGIC:  Nonfocal.   Chest x-ray not performed.  EKG demonstrated atrial fibrillation with ST-  T wave abnormality consistent with dig effect.   LABORATORY DATA:  Her labs significant  for white count 6.8, hemoglobin  14.8, potassium at 3.3.  BUN and creatinine  16 and 0.9.  First set of  biomarkers are  negative and her digoxin level is 0.4.   IMPRESSION:  1. Rapid atrial fibrillation with recurrent chest pain.  2. Hypokalemia.  3. History of tachycardia-bradycardia syndrome status post pacemaker.  4. History of hypertension.   PLAN:  Admit the patient to Dr. Ronnald Nian service for rate control and  chest pain management.  We will continue her home meds including her  atenolol and Tikosyn.  We will DC her diltiazem, as she is becoming  hypotensive on it and has now already been loaded with good heart rate  response.  We will place her on potassium, also cycle her biomarkers.  I  think it is unlikely that this chest discomfort with her EKG represents  any type of acute coronary syndrome; therefore, we will not initiate  heparin dose again this time, just continue her on her aspirin.      Vernice Jefferson, MD  Electronically Signed     JT/MEDQ  D:  01/23/2008  T:  01/23/2008  Job:  [id number]

## 2010-07-19 NOTE — Consult Note (Signed)
NAME:  Katrina Rodriguez, HELBERT NO.:  [id number]   MEDICAL RECORD NO.:  [id number]          PATIENT TYPE:  INP   LOCATION:  4703                         FACILITY:  [facility]   PHYSICIAN:  Hillis Range, MD       DATE OF BIRTH:  1928-05-18   DATE OF CONSULTATION:  DATE OF DISCHARGE:                                 CONSULTATION   REQUESTING PHYSICIAN:  [name], MD   REASON FOR CONSULTATION:  Atrial fibrillation management.   HISTORY OF PRESENT ILLNESS:  Katrina Rodriguez is a pleasant 77 year old female  with a history of symptomatic paroxysmal atrial fibrillation who  presents today for further management of symptomatic atrial  fibrillation.  The patient reports longstanding symptomatic atrial  fibrillation for which she has previously been treated with amiodarone.  This medication was discontinued after she developed thyroid  dysfunction.  She was subsequently placed on dofetilide and has been  maintained on this medicine for some time.  Difficulties with rate  control have been observed due to frequent episodes of syncope, which  have felt to be vasovagal in nature.  The patient notes that if she  stands for a prolonged period that she has a tendency to have syncope.  She has also not been chronically anticoagulated with Coumadin for the  same reason.   The patient reports that during episodes of atrial fibrillation, she  developed chest discomfort and palpitations with dizziness and fatigue.  These episodes often last several hours and have required frequent  hospitalizations.  She also has a history of tachycardia bradycardia  syndrome for which she underwent implantation of a Medtronic dual-  chamber pacemaker by Dr. Graciela Husbands in 2004.  Unfortunately, she has been  intolerant to ventricular pacing in the past and therefore has had her  device programmed AAI.  She notes waking last night at approximately 11  p.m. with symptomatic palpitations.  These progressed over the  evening  and she subsequently presented to the hospital after having converted to  sinus rhythm spontaneously.  Interrogation of the patient's pacemaker  has indeed documented symptomatic atrial fibrillation with ventricular  rates up to 163 beats per minute, which are felt to likely represents  the cause for her symptoms.  She has also been demonstrated to have  atrial fibrillation on February 06, 2008, and February 13, 2008.  The  patient also reports intermittent palpitations and skipped heartbeats  which have not been detected by her device.  These are felt to likely  have represented PVCs.  The patient reports symptoms of chest pain with  these palpitations, but does feel that is much less severe in intensity.  Presently, she is resting comfortably and is otherwise without  complaint.   PAST MEDICAL HISTORY:  1. Paroxysmal atrial fibrillation (as above).  2. Tachycardia bradycardia syndrome, status post dual-chamber      pacemaker implantation by Dr. Berton Mount in 2004.  3. Syncope  which is attributed to neurocardiogenic syncope.  4. Left heart catheterization in 2005 revealed normal coronary      arteries with a preserved  ejection fraction.   ALLERGIES:  PENICILLIN, CODEINE, DARVOCET, MORPHINE cause rash,  AMIODARONE causes thyroid dysfunction.   HOME MEDICATIONS:  1. Atenolol 50 mg twice daily.  2. Aspirin 81 mg daily.  3. Florinef 0.1 mg b.i.d.  4. Digoxin 0.25 mg b.i.d.  5. Potassium chloride 20 mEq daily.  6. Dofetilide 250 mcg b.i.d.   SOCIAL HISTORY:  The patient lives in [place].  She denies tobacco,  alcohol, or drug use.   FAMILY HISTORY:  Notable for coronary artery disease.   REVIEW OF SYSTEMS:  All systems are reviewed and negative except as  outlined in the HPI above.   PHYSICAL EXAMINATION:  VITALS:  Blood pressure 106/61, heart rate 50,  respirations 18, sats 95% on room air, and afebrile.  GENERAL:  The patient is a thin elderly female in no acute  distress.  She is alert and oriented x3.  HEENT:  Normocephalic and atraumatic.  Sclerae clear.  Conjunctivae  pink.  Oropharynx clear.  NECK:  Supple.  No thyromegaly, JVD, or bruits.  LUNGS:  Clear to auscultation bilaterally.  HEART:  Regular rate and rhythm.  No murmurs, rubs, or gallops.  GI:  Soft, nontender, and nondistended.  Positive bowel sounds.  EXTREMITIES:  No clubbing, cyanosis, or edema.  NEUROLOGIC:  Strength and sensation are intact.  SKIN:  No ecchymosis or laceration.  MUSCULOSKELETAL:  No deformity or atrophy.  PSYCHIATRIC:  Euthymic mood.  Full affect.   EKG reveals sinus bradycardia at 60 beats per minute, otherwise  unremarkable.   LABORATORY DATA:  Digoxin level 1.2, hematocrit 41, creatinine 0.9,  potassium 3.3.  TSH 3.975.  Cardiac markers negative.   IMPRESSION:  Katrina Rodriguez is a pleasant 77 year old female with symptomatic  and medically refractory paroxysmal atrial fibrillation who is admitted  for  a symptomatic episode of atrial fibrillation.  Therapeutic  strategies for atrial fibrillation including both medicine and catheter-  based therapies were discussed in detail with the patient today.  I  believe that her atrial fibrillation is reasonably well controlled with  Tikosyn dose.  She certainly does have breakthrough episodes for which  she is highly symptomatic.  We can consider Multaq as an alternative  medication though I am not sure that we will have better success with  this medication.  We could also consider a pill-in-the-pocket approach  with a calcium channel blocker for rate control during episodes of  atrial fibrillation, which may help to minimize her symptoms.  I do not  believe that she is a candidate for AV nodal ablation and pacing as she  has had significant intolerance with ventricular pacing in the past.  We  could certainly consider the possibility of pulmonary vein isolation as  a catheter strategy for her atrial fibrillation.   She is aware that she  is at increased risk due to her small size and age.  These risks include  stroke, bleeding, vascular damage, pericardial effusion with tamponade,  perforation, damage to the esophagus and lungs and pulmonary vein  stenosis.  The patient understands these risks and would be willing to  consider catheter ablation.  I think that prior to ablation, we should  also review a recent echocardiogram to evaluate her left atrial size and  also rule out any significant structural valvular abnormalities.  I have  made no medication changes at this time.  The patient will further  contemplate the strategies  and I will discuss them with Dr. Swaziland.  We will formulate a  plan prior  to discharge.  The patient is aware that should she wish to proceed with  catheter ablation, she would require Coumadin with a therapeutic INR for  4 weeks prior to ablation and 12 weeks afterwards.      Hillis Range, MD  Electronically Signed     JA/MEDQ  D:  04/14/2008  T:  04/15/2008  Job:  [id number]   cc:   [name], M.D.

## 2010-07-19 NOTE — Discharge Summary (Signed)
NAME:  Katrina Rodriguez, Katrina Rodriguez NO.:  [id number]   MEDICAL RECORD NO.:  [id number]          PATIENT TYPE:  INP   LOCATION:  4703                         FACILITY:  [facility]   PHYSICIAN:  [name], M.D.  DATE OF BIRTH:  02/11/1929   DATE OF ADMISSION:  04/14/2008  DATE OF DISCHARGE:  04/15/2008                               DISCHARGE SUMMARY   Katrina Rodriguez is a very pleasant 77 year old white female who has a history  of tachy-brady syndrome.  She is status post permanent pacemaker  implanted in 2004 with a Medtronic impulse device.  She has had  recurrent atrial fibrillation and failed previous therapy with Rythmol.  She has also failed amiodarone due to development of significant  hyperthyroidism.  She has been on Tikosyn for the past 3 years.  She has  also been on beta-blocker therapy with atenolol and with Lanoxin.  Despite aggressive medical therapy, she has still had refractory  paroxysmal atrial fibrillation.  This has been associated with  significant chest pain requiring hospitalization.  Each of her episodes  have clearly been associated with recurrent atrial fibrillation.  Episodes may last 1-2 hours.  Her last hospitalization in November, she  had had atrial fibrillation for approximately 48-72 hours.  It is also  harassing to note the patient has had previous normal cardiac  catheterization in 2005.  She has had significant chest pain with  ventricular pacing in the past.  Multiple attempts were made to adjust  her pacemaker, but the only way to get rid of her pain was to re-program  her to AAA mode.  It is unclear whether this was pectoral stimulation,  diaphragmatic stimulation, constant stimulation, but she could not be  ventricularly paced without having recurrent symptoms.  Previously, she  has not been anticoagulated due to a history of recurrent syncope and  falls.  Her syncope is multifactorial with components of orthostatic  hypotension and  neurocardiogenic syncope.  The patient presented at this  time with another episode of atrial fibrillation that was sustained in  associated with 10/10 chest pain.   For details of her past medical history, social history, family history,  and physical exam, please see admission history and physical.   LABORATORY DATA:  Initial ECG showed atrial fibrillation with a rapid  ventricular response.  She was then converted to sinus rhythm with  atrial pacing and remained in sinus rhythm during the remainder of her  hospital stay.  Chest x-ray showed no active disease.  White count 5700,  hemoglobin 14.3, hematocrit 41.6, platelets 127,000.  Sodium was 140,  potassium 3.3, chloride 105, CO2 of 30, BUN 12, creatinine 0.9, glucose  is 117.  Coags were normal.  Serial cardiac enzymes were negative x4.  Total cholesterol was 181, LDL of 124, HDL of 35, and triglycerides of  111.  Magnesium was 2.5, calcium 823.  TSH was 3.976.  Dig level was  1.2.   HOSPITAL COURSE:  The patient was admitted to telemetry monitoring.  As  noted, she did rule out myocardial infarction.  She was  seen in  consultation by Dr. Hillis Range for EP evaluation.  Dr. Johney Frame  concurred that the patient's medical therapy had been maximized.  Considerations were to consider her for AV nodal ablation, but given her  previous problems with ventricular pacing, it was felt to not be a good  option.  He did feel that she was a reasonable candidate for pulmonary  vein isolation.  We recommended anticoagulation with a therapeutic INR  of 2-3 for 4 weeks prior to therapy and for 12 weeks thereafter.  I  agreed with these recommendations as did the patient.  We therefore  recommended discharge to home with initiation of Coumadin therapy.  We  will adjust her INR as an outpatient.  We will also arrange for her to  have a followup echocardiogram and adenosine Cardiolite study as an  outpatient.  Once she has been anticoagulated  therapeutically for 4  weeks, we will arrange for her to undergo the pulmonary vein isolation  procedure.   DISCHARGE DIAGNOSES:  1. Recurrent paroxysmal atrial fibrillation.  2. Chest pain secondary to recurrent paroxysmal atrial fibrillation.  3. Tachycardia-bradycardia syndrome status post pacemaker implant in      2004.  4. History of recurrent syncope, multifactorial related to orthostatic      hypotension and neurocardiogenic syncope.   DISCHARGE MEDICATIONS:  1. Atenolol 50 mg twice daily.  2. Digoxin 0.25 mg per day.  3. Potassium 40 mEq per day.  4. Tikosyn 250 mcg twice a day.  5. Florinef 0.1 mg twice a day.  6. Coumadin 2.5 mg daily.   Recommend that she stop taking aspirin at this point.  We will arrange  for followup INR in our office on Monday, April 20, 2008.  We will  also arrange outpatient echocardiogram and adenosine Cardiolite studies.   DISCHARGE STATUS:  Improved.           ______________________________  [name], M.D.     PMJ/MEDQ  D:  04/15/2008  T:  04/15/2008  Job:  [id number]   cc:   Hillis Range, MD

## 2010-07-22 NOTE — Cardiovascular Report (Signed)
NAME:  Katrina Rodriguez, Katrina Rodriguez                             ACCOUNT NO.:  [id number]   MEDICAL RECORD NO.:  [id number]                   PATIENT TYPE:  OIB   LOCATION:  2899                                 FACILITY:  [facility]   PHYSICIAN:  [name], M.D.               DATE OF BIRTH:  12/26/1928   DATE OF PROCEDURE:  03/31/2003  DATE OF DISCHARGE:  03/31/2003                              CARDIAC CATHETERIZATION   PROCEDURE:  Left heart catheterization, coronary and left ventricular  angiography.   CARDIOLOGIST:  [name], M.D.   INDICATIONS:  The patient is a 77 year old white female with a history of  sick sinus syndrome who presents with symptoms of typical exertional angina.  She has a history of hypercholesterolemia.   ACCESS:  Via the right femoral artery using the standard Seldinger  technique.   EQUIPMENT:  6 French 4 cm right and left Judkins catheter, 6 French pigtail  catheter, 6 French arterial sheath.   MEDICATIONS:  Local anesthesia with 1% Xylocaine.   CONTRAST:  105 mL of Omnipaque.   HEMODYNAMIC DATA:  Aortic pressure is 104/73 with a mean of 85.  Left  ventricular pressure is 116 with an EDP of 6 mmHg.   ANGIOGRAPHIC DATA:  1. The left coronary artery arises and distributes normally.  2. The left main coronary artery is normal.  3. The left anterior descending artery has minimal irregularities, less than     10% in the distal vessel.  There is a very large first diagonal branch     which appears normal.  4. The left circumflex coronary artery appears normal.  5. The right coronary artery is normal.   LEFT VENTRICULAR ANGIOGRAPHY:  The left ventricular angiography is performed  in the RAO view demonstrates normal left ventricular size and contractility  with normal systolic function.  The ejection fraction is estimated at 65%.  There is mild dilatation of the proximal aorta.  The aortic valve appears  normal.  There is no mitral insufficiency.   FINAL  INTERPRETATION:  1. No significant atherosclerotic coronary artery disease.  2. Normal left ventricular function.                                               [name], M.D.    PMJ/MEDQ  D:  03/31/2003  T:  03/31/2003  Job:  [id number]   cc:   Stacie Acres. White, M.D.  [address]  [place]  [place] [address]  Fax: [phone]

## 2010-07-22 NOTE — H&P (Signed)
NAME:  Katrina Rodriguez, Katrina Rodriguez                            ACCOUNT NO.:  [id number]   MEDICAL RECORD NO.:  [id number]                   PATIENT TYPE:  OIB   LOCATION:                                       FACILITY:  [facility]   PHYSICIAN:  [name], M.D.               DATE OF BIRTH:  05-04-1928   DATE OF ADMISSION:  03/31/2003  DATE OF DISCHARGE:                                HISTORY & PHYSICAL   HISTORY OF PRESENT ILLNESS:  Ms. Koop is a 77 year old white female with a  history of paroxysmal atrial tachycardia and a history of tachy/brady  syndrome with atrial fibrillation, status post DDD pacemaker implant.  She  presents now with crescendo anginal symptoms.  The patient states that she  has had chest pain off and on with exertion over the past two to three  months.  She initially noticed this when she walked out to her mail box and  had to walk back up an incline.  She had to stop and rest.  She has now been  noticing this more when she is walking up stairs.  Over the past week, her  symptoms have increased in frequency and severity and have lasted up to 30  minutes at a time.  She had an episode approximately a week ago where she  had fairly severe pain lasting 30 minutes.  She does get relief with  sublingual nitroglycerin.  She has had one episode of rest angina.  Her  prior cardiac evaluation includes an adenosine Cardiolite study which was  negative in 2002.  Because of her increasing symptoms, she is now admitted  for cardiac catheterization.   PAST MEDICAL HISTORY:  1. Tachy/brady syndrome with PAT and history of atrial fibrillation.  The     patient is on chronic amiodarone therapy.  She is status post pacemaker     implant on May 13, 2002.  2. History of recurrent syncope, multifactorial.  3. Orthostatic hypotension.  4. Prior foot surgery.   ALLERGIES:  1. CODEINE.  2. DEMEROL.  3. DARVOCET.   MEDICATIONS:  1. Potassium 20 mEq daily.  2. Florinef 0.1 mg alternating  with 0.2 mg daily.  3. Coated aspirin 325 mg daily.  4. Amiodarone 200 mg t.i.d.  5. Atenolol 50 mg per day.   SOCIAL HISTORY:  The patient lives alone.  She is widowed.  She is a  nonsmoker.  She does not drink alcohol.   FAMILY HISTORY:  Noncontributory.   REVIEW OF SYSTEMS:  She has had no increased edema.  She has had no recent  syncopal spells.  Denies any palpitations.  No history of TIA or stroke.  Other review of systems are negative.   PHYSICAL EXAMINATION:  GENERAL APPEARANCE:  The patient is an elderly white  female in no apparent distress.  VITAL SIGNS:  The blood pressure is 150/104.  The  pulse is 80 and regular.  Respirations are normal.  WEIGHT:  120 pounds.  HEENT:  Pupils equal, round, and reactive.  The conjunctivae are clear.  The  funduscopic exam is benign.  The oropharynx is clear.  NECK:  Supple without JVD, adenopathy, thyromegaly, or bruits.  LUNGS:  Clear to auscultation and percussion.  CARDIAC:  Regular rate and rhythm without murmurs, rubs, gallops, or clicks.  ABDOMEN:  Soft and nontender.  She has no hepatosplenomegaly, masses, or  bruits.  Femoral and pedal pulses are 2+ and symmetric.  EXTREMITIES:  She has no edema.  NEUROLOGIC:  Exam is nonfocal.   LABORATORY DATA:  ECG shows normal sinus rhythm and nonspecific ST-T wave  changes.  Chest x-ray shows no active disease.  Other laboratories are  pending.   IMPRESSION:  1. Crescendo angina.  2. Tachy/brady syndrome, status post DDD pacemaker implant.  Chronic     amiodarone therapy.  3. Recurrent syncope.  4. Orthostatic hypotension.   PLAN:  The patient will be admitted for cardiac catheterization with further  therapy pending these results.                                                [name], M.D.    PMJ/MEDQ  D:  03/30/2003  T:  03/30/2003  Job:  [id number]

## 2010-07-22 NOTE — Consult Note (Signed)
NAME:  Katrina Rodriguez, Katrina Rodriguez                             ACCOUNT NO.:  [id number]   MEDICAL RECORD NO.:  [id number]                   PATIENT TYPE:  INP   LOCATION:  2040                                 FACILITY:   PHYSICIAN:  Doylene Canning. Ladona Ridgel, M.D. [facility]           DATE OF BIRTH:  [birth year]   DATE OF CONSULTATION:  05/12/2002  DATE OF DISCHARGE:                                   CONSULTATION   REASON FOR CONSULTATION:  Evaluation of atrial fibrillation with tachybrady  syndrome.   REFERRING PHYSICIAN:  [name], M.D.   HISTORY OF PRESENT ILLNESS:  The patient is a very pleasant 77 year old  woman with a history of tachypalpitations and paroxysmal atrial fibrillation  for several years. She has been on Rythmol for seven years.  She has had  intermittent breakthroughs of the atrial fibrillation on the Rythmol but  over the last several months has had increasingly frequent breakthroughs on  Rythmol.  She recently was wearing a cardiac monitor which demonstrated a  recurrent atrial fibrillation with rapid ventricular rates of over 150 beats  per minute.  In addition, she has had bradycardia with heart rates down in  the lower 30s with pauses up to three seconds.  These were symptomatic.  The  patient also has a long history of syncope which is thought to be secondary  to both her paroxysms of atrial fibrillation as well as orthostasis.  There  may also be a neurally mediating component to her syncope.  She has not been  on Coumadin in the past secondary to problems with syncope and falls.   PAST MEDICAL HISTORY:  As previously noted.  She also has a history of SVT  though this is not well characterized.  As noted she has had orthostasis in  the past and is on Florinef.   SURGICAL HISTORY:  Notable for foot surgery in the past.   SOCIAL HISTORY:  The patient lives in [place] alone.  She is  retired.  She is widowed.  She denies tobacco or ethanol use.   FAMILY HISTORY:   Noncontributory.  Recent Cardiolite stress test demonstrated no ischemia and normal left  ventricular function.   REVIEW OF SYSTEMS:  As noted by Chinita Pester.  Pertinent for chest pain,  shortness of breath, and palpitations.  They are also pertinent for syncope  and chronic fatigue and weakness.  Please see Diane Sauro's review of  systems.  In general, the rest were all negative.   PHYSICAL EXAMINATION:  GENERAL:  She is a pleasant, well-appearing 73-year-  old woman in no distress.  VITAL SIGNS:  Blood pressure 110/60, pulse 64 and regular, respirations 20,  temperature 98.4.  HEENT:  Normocephalic, atraumatic.  Pupils are equal and round.  Oropharynx  moist.  Sclerae anicteric.  NECK:  Supple.  No jugular venous distension.  There is no thyromegaly.  Carotids 2+ and symmetric.  CARDIOVASCULAR:  On my exam, reveals a regular rate and rhythm with normal  S1 and S2.  I did not appreciate an S3 or S4 gallop.  Nor did I appreciate a  murmur.  LUNGS:  Clear bilaterally to auscultation. There are no wheezes, rales or  rhonchi.  ABDOMEN:  Soft, nontender, nondistended.  There is no organomegaly.  EXTREMITIES:  Demonstrated no cyanosis, clubbing or edema.  NEUROLOGIC:  Alert and oriented x 3 with cranial nerves II-XII grossly  intact.  Strength was 5/5 and symmetric.   LABORATORY DATA:  EKG demonstrates normal sinus rhythm with normal axis and  intervals.   IMPRESSION:  1. Paroxysmal atrial fibrillation with symptomatic tachybrady syndrome.  2. History of syncope.  3. History of not being on Coumadin secondary to #2.  4. Remote history of supraventricular tachycardia.  5. Questionable orthostasis now on Florinef with improvement in syncopal     symptoms.   DISCUSSION:  I agree that with the patient's rapid heart rates and atrial  fibrillation as well as bradycardia in sinus rhythm, that current pacing is  warranted and indicated.  At this point in time, she seems to have failed   Rythmol and a combination of AV nodal blocking drugs.  I think proceeding  with amiodarone, +/- a beta blocker in conjunction with permanent pacemaker  insertion would be warranted.  Long-term, it may be that with maintenance in  sinus rhythm and backup bradycardic pacing, that her syncopal episodes will  resolve and Coumadin therapy would a consideration.                                               Doylene Canning. Ladona Ridgel, M.D. [facility]    GWT/MEDQ  D:  05/12/2002  T:  05/13/2002  Job:  [id number]   cc:   [name], M.D.  [address], [address]  [place], [place] [address]  Fax: [phone]   Duke Salvia, M.D. [facility]   Kathrine Cords, R.N. [facility]

## 2010-07-22 NOTE — Discharge Summary (Signed)
NAME:  Katrina Rodriguez, Katrina Rodriguez NO.:  [id number]   MEDICAL RECORD NO.:  [id number]          PATIENT TYPE:  INP   LOCATION:  2041                         FACILITY:  [facility]   PHYSICIAN:  [name], M.D.  DATE OF BIRTH:  [birth year]   DATE OF ADMISSION:  08/10/2004  DATE OF DISCHARGE:  08/12/2004                                 DISCHARGE SUMMARY   HISTORY OF PRESENT ILLNESS:  Ms. Hlad is a 77 year old white female who has  a history of recurrent syncope, orthostatic hypotension and tachybrady  syndrome. She is status post permanent transvenous pacemaker placement in  March 2004 and has been on chronic amiodarone therapy since that time. She  also has a history of chronic chest pain with normal cardiac catheterization  and normal Cardiolite studies in the past. The patient presented on the day  of admission with the onset of severe substernal chest pain associated with  tachy palpitations. She had pain radiating to both arms and nausea. She was  driven by friends to the emergency room where she was found to be in atrial  fibrillation with rapid ventricular response. The patient was initially  given IV Cardizem which resulted in hypotension. She was given oral dose of  amiodarone and subsequently converted to normal sinus rhythm. She was  admitted for further evaluation and treatment. For details, of her past  medical history, social history, family history, and physical exam, please  see admission history and physical.   LABORATORY DATA:  A pH 7.35, pCO2 of 46, bicarbonate 25. White count 4700,  hemoglobin 12.7, hematocrit 37.2, platelets 139,000. Coags were normal.  Sodium 141, potassium 3.5, chloride 107, CO2 25, BUN 12, creatinine 1.0,  glucose of 92, calcium 8.2, albumin 3.2. Liver function studies were all  normal. Cardiac enzymes were negative x3.   ECG initially showed atrial fibrillation with rapid ventricular response and  aberrant beats. There were nonspecific  ST-T wave changes. Repeat ECG showed  normal sinus rhythm with a nonspecific ST-T wave changes. Chest x-ray showed  no active disease.   HOSPITAL COURSE:  The patient is admitted to step-down unit. She was  initially treated with increased oral amiodarone dose. However, her  subsequent TSH returned of 0.007 consistent with hyperthyroidism. Repeat TSH  was 0.004. The C3 level was elevated 4.94 and T4 was elevated to 2.03. Given  new finding of hyperthyroidism, it was felt this was secondary to her  chronic amiodarone therapy. At this point, we recommended stopping her  amiodarone. Given the fact she had been on amiodarone chronically, we could  not initiate any additional antiarrhythmic drug at this point but  recommended continued rate control. We increased her atenolol to 50  milligrams b.i.d. She was digitalized and placed on Lanoxin 0.125 milligrams  daily. She had no further recurrences of atrial fibrillation during her  hospital stay. She remained in sinus rhythm with atrial pacing. While her  arrhythmia resolved, the patient did remain hypotensive with significant  orthostasis. Her Florinef was increased to 0.2 milligrams daily. She was  hydrated with saline and her  isosorbide was discontinued. With this, her  orthostasis improved and she remained asymptomatic. It is felt that she was  stable for discharge on August 12, 2004. The patient does have a history of  recurrent syncope and falls and for this reason she was not felt to be a  candidate for long-term anticoagulant therapy.   DISCHARGE DIAGNOSES:  1.  Atrial fibrillation with rapid ventricular response.  2.  Hyperthyroidism secondary to amiodarone therapy.  3.  Orthostatic hypotension.  4.  Tachybrady syndrome status post DDD pacemaker placement.  5.  History of recurrent syncope and falls.  6.  Chronic chest pain.  7.  History of hypokalemia.   DISCHARGE MEDICATIONS:  1.  Aspirin 81 milligrams per day.  2.  Florinef 0.2  milligrams daily.  3.  Atenolol 50 milligrams b.i.d.  4.  Lanoxin 0.125 milligrams daily.  5.  Potassium 20 mEq per day.  6.  It was recommended she stop amiodarone and Imdur.   DISCHARGE INSTRUCTIONS:  It is recommended that she stand and sits up slowly  using supportive therapy. Recommend she liberalize salt in her diet. She  will follow up with Dr. Swaziland one week.   CAUSE OF DEATH:  Discharge status is improved.       PMJ/MEDQ  D:  08/12/2004  T:  08/12/2004  Job:  [id number]   cc:   Dario Guardian, M.D.  [address]  [place]  [place] [address]  Fax: [phone]

## 2010-07-22 NOTE — Op Note (Signed)
NAME:  Katrina Rodriguez, Katrina Rodriguez                             ACCOUNT NO.:  [id number]   MEDICAL RECORD NO.:  [id number]                   PATIENT TYPE:  OIB   LOCATION:  2550                                 FACILITY:  [facility]   PHYSICIAN:  Alford Highland. Rankin, M.D.                DATE OF BIRTH:  1928/05/22   DATE OF PROCEDURE:  08/24/2003  DATE OF DISCHARGE:  08/24/2003                                 OPERATIVE REPORT   PREOPERATIVE DIAGNOSIS:  Epiretinal membrane of the left eye with macular  topograph distortion and visual acuity loss, left eye.   POSTOPERATIVE DIAGNOSIS:  Epiretinal membrane of the left eye with macular  topograph distortion and visual acuity loss, left eye.   PROCEDURE:  Posterior vitrectomy with membrane peel, left eye.   SURGEON:  Alford Highland. Rankin, M.D.   ANESTHESIA:  Local with retrobulbar block.   INDICATIONS FOR PROCEDURE:  The patient is a 77 year old woman who has  profound vision loss of the left eye at a level of 20/200 on the basis of  epiretinal membrane idiopathic.  This is an attempt to remove the membrane  and allow for the potential for visual acuity improvement.  She understands  the risks of anesthesia and the rare occurrence of death, loss to the eye,  including but not limited to hemorrhage, infection, scarring, need for  further surgery, no change in vision, loss of vision, and progression of  disease despite intervention.  After appropriate signed consent was  obtained, she was taken to the operating room.   DESCRIPTION OF PROCEDURE:  In the operating room, appropriate monitoring was  supplied by mild sedation.  0.75% Marcaine was delivered 5 mL retrobulbar  and an additional 5 mL laterally fashioned in modified [employer]  The left  periocular region was sterilely prepped and draped in the usual ophthalmic  fashion.  A lid speculum was applied.  A conjunctival peritomy was then  fashioned temporally and superonasally.  The 4 mm infusion cannula was  secured  3.5 mm posterior to the limbus in the inferotemporal quadrant.  Placement in the vitreous cavity was verified visually.  A superior  sclerotomy was then fashioned.  The operating microscope was placed into  position with the BIOM attached.  A core vitrectomy was then begun.  Vitreous skirt trimmed 360 degrees.  The epiretinal membrane was grasped  with DORC forceps and removed without difficulty and topographic distortion  improved nearly immediately.  Peripheral retina was inspected and found to  be free of retinal holes or tears.  The instruments were removed from the  eye.  The superior sclerotomy was closed with 7-0 Vicryl.  The infusion was  removed and sclerotomy closed.  The conjunctiva was closed with 7-0 Vicryl.  Subconjunctival injection of steroids were applied.  Intravenous antibiotic  prophylaxis had been given.  A sterile patch and Fox shield were applied.  The  patient tolerated the procedure well without complications.  She was  taken to the recovery room in good, stable condition.                                               Alford Highland Rankin, M.D.    GAR/MEDQ  D:  10/19/2003  T:  10/19/2003  Job:  [id number]

## 2010-07-22 NOTE — Op Note (Signed)
NAME:  Katrina Rodriguez, Katrina Rodriguez                             ACCOUNT NO.:  [id number]   MEDICAL RECORD NO.:  [id number]                   PATIENT TYPE:  INP   LOCATION:  2040                                 FACILITY:  [facility]   PHYSICIAN:  Duke Salvia, M.D. [facility]           DATE OF BIRTH:  [birth year]   DATE OF PROCEDURE:  05/13/2002  DATE OF DISCHARGE:                                 OPERATIVE REPORT   PREOPERATIVE DIAGNOSIS:  Tachybrady syndrome.   POSTOPERATIVE DIAGNOSIS:  Tachybrady syndrome with paroxysmal rapid atrial  fibrillation and post-termination pauses and sinus bradycardia.   PROCEDURE:  Dual-chamber pacemaker implantation.   DESCRIPTION OF PROCEDURE:  Following the obtaining of informed consent, the  patient was brought to the cardiac catheterization laboratory and placed on  the fluoroscopic table in a supine position.  After routine prep and drape  of the left upper chest, intravenous contrast was injected via the left  antecubital vein to identify the course and the patency of the extrathoracic  left subclavian vein.  This having been accomplished, lidocaine was  infiltrated in the prepectoral subclavicular region and an incision was made  and carried down to the layer of the prepectoral fascia using  electrocautery.  A pocket was formed using electrocautery, and hemostasis  was obtained.   Thereafter, attention was turned to gaining access to the extrathoracic left  subclavian vein, which was accomplished without difficulty and without the  aspiration of air or puncture of the artery.  Two separate venipunctures  were accomplished, a guidewire was placed and retained, and a 0 silk suture  was placed in a figure-of-eight fashion and allowed to hang loosely.   Subsequently a 7 [place] and 9 [place] tear-away introducer sheath were  passed, through which were passed sequentially a Guidant 4458 52-cm passive-  fixation ventricular lead, serial number [id number], and a Pacesetter  1642  passive-fixation atrial lead, serial number [id number].  These leads were  passed under fluoroscopic guidance to the right ventricular apex and right  atrial appendage, respectively, where the bipolar R-wave was 11.2 millivolts  with an impedance of 590 Ohms, a pacing threshold of 0.4 volts at 0.5 msec,  currented threshold was 0.8 MA, and there was no diaphragmatic pacing at 10  volts.   The bipolar P-wave was 2.1 millivolts with an impedance of 421 Ohms and a  pacing threshold of 0.5 volts at 0.5 msec with a currented threshold of 1.0  MA.  With these acceptable parameters recorded, the leads were secured to  the prepectoral fascia and then attached to a Medtronic Impulse E1DR01 pulse  generator, serial number [id number].  Atrial pacing and AV pacing were  identified.  The pocket was copiously irrigated with antibiotic-containing  saline solution, hemostasis was assured, and the leads and the pulse  generator were then placed in the pocket, secured to the prepectoral fascia.  The wound was then closed in  three layers in the normal fashion.  The wound  was washed, dried, and a Benzoin and Steri-Strip dressing was applied.  The  needle counts, sponge counts, and instrument counts were correct at the end  of the procedure according to the staff.   The patient tolerated the procedure without apparent complication.   We will begin the patient on amiodarone.   We will need to redose the patient's Lanoxin.                                                Duke Salvia, M.D. [facility]    SCK/MEDQ  D:  05/13/2002  T:  05/13/2002  Job:  [id number]   cc:   [name], M.D.  [address]  [place], [place] [address]  Fax: [phone]   Electrophysiology Laboratory

## 2010-07-22 NOTE — Discharge Summary (Signed)
NAME:  Katrina Rodriguez, Katrina Rodriguez NO.:  [id number]   MEDICAL RECORD NO.:  [id number]          PATIENT TYPE:  INP   LOCATION:  2041                         FACILITY:  [facility]   PHYSICIAN:  [name], M.D.  DATE OF BIRTH:  [birth year]   DATE OF ADMISSION:  09/08/2004  DATE OF DISCHARGE:  09/11/2004                                 DISCHARGE SUMMARY   HISTORY OF PRESENT ILLNESS:  Katrina Rodriguez is a 77 year old white female with  tachybrady syndrome. She was admitted for initiation of antiarrhythmic drug  therapy, Tikosyn.  She has a history of recurrent atrial fibrillation. She  has previously been treated with __________ but had significant breakthrough  on this drug. She subsequently had a DVD pacemaker implant in 2004 and was  placed on amiodarone. She was admitted a month ago with recurrent atrial  fibrillation, rapid ventricular response associated with syncope and chest  pain. She was found to be hyperthyroidal on amiodarone. Amiodarone was  discontinued and she had been treated with rate control since that time  until her amiodarone level dropped to an acceptable level. Now, she is  admitted for initiation of Tikosyn therapy.   PAST MEDICAL HISTORY:  Please see the admission history and physical.   SOCIAL HISTORY:  Please see the admission history and physical.   FAMILY HISTORY:  Please see the admission history and physical.   PHYSICAL EXAMINATION:  Please see the admission history and physical.   LABORATORY DATA:  ECG shows atrial pacing. She has nonspecific ST-T wave  changes. White count 6300, hemoglobin 14.6, hematocrit 42.4.  No platelet  count was performed. Sodium was 137, potassium 4.7, chloride 104, CO2 26,  BUN 13, creatinine 1, calcium 9, magnesium 2.5, glucose 114. TSH was less  than 0.004, free T4 was 1.7, free T3 was 4.9, consistent with persistent  hyperthyroidism.   HOSPITAL COURSE:  The patient was admitted to telemetry monitoring. She was  started  on Tikosyn therapy at 250 mcg p.o. q.12h. Her QTC was monitored  closely. She had no significant prolongation. She tolerated the medication  well. She had no recurrent atrial fibrillation during her hospital stay. She  had no complications and was discharged home in stable condition on September 11, 2004.   DISCHARGE DIAGNOSIS:  1.  Tachybrady syndrome with recurrent atrial fibrillation.  2.  Status post DVD pacemaker implant 2004.  3.  History of chest pain with negative cardiac catheterization in January      of 2005.  4.  Recurrent syncope.  5.  History of orthostatic hypotension.  6.  Hyperthyroidism secondary to amiodarone therapy.   DISCHARGE MEDICATIONS:  1.  Tikosyn 150 mcg b.i.d.  2.  Aspirin 81 mg per day.  3.  Florinef 0.2 mg twice a day.  4.  Atenolol 50 mg twice a day.  5.  Lanoxin 0.125 mg daily.  6.  Potassium 40 mEq daily.   FOLLOW UP:  The patient will have follow-up with Dr. Swaziland in two weeks and  will complete an ECG at that time.   CONDITION  ON DISCHARGE:  Discharge status is improved.           ______________________________  [name], M.D.     PMJ/MEDQ  D:  11/17/2004  T:  11/17/2004  Job:  [id number]

## 2010-07-22 NOTE — H&P (Signed)
NAME:  Katrina, Rodriguez                   ACCOUNT NO.:  [id number]   MEDICAL RECORD NO.:  [id number]          PATIENT TYPE:  INP   LOCATION:  1843                         FACILITY:  [facility]   PHYSICIAN:  Colleen Can. Deborah Chalk, M.D.DATE OF BIRTH:  05/14/1928   DATE OF ADMISSION:  08/10/2004  DATE OF DISCHARGE:                                HISTORY & PHYSICAL   REASON FOR ADMISSION:  Atrial fibrillation with rapid ventricular response  with substernal chest pain.   HISTORY OF PRESENT ILLNESS:  Katrina Rodriguez is a 77 year old female who  experienced the onset of substernal chest pain at approximately 2 p.m.  today.  It was a heavy, substernal discomfort radiating to both arms.  It  would last and was somewhat persistent over the next four hours.  It tended  to be related to how fast the rapid response to her atrial fibrillation was.  Nitroglycerin provided no relief.  She was somewhat nauseated but had no  shortness of breath, no dizziness.  She felt like her heart was racing.  She  has had recurrent pain because she tends to be hypertensive and has limited  ability to slow her heart rate in the emergency room.  She was noted to be  in atrial fibrillation in the emergency room.   PAST MEDICAL HISTORY:  She has a history of catheterization in January 2005.  That showed minimal coronary atherosclerosis and a global ejection fraction  of 65%.  Her previous hospitalizations have included tachybrady syndrome  with atrial fibrillation with rapid ventricular response as well as  bradycardia.  She has had a previous history of syncope, a history of  supraventricular tachycardia, apparently with ablation, and orthostatic  hypotension.  She had a permanent transvenous pacemaker implanted in March  2004 with a Medtronic En Pulse DDD pulse generator.  She has a history of  orthostatic hypotension in the past.  She apparently did have some degree of  nodal ablation according to her history, in 1996.  Other problems  include a  history of orthostasis, and she has been on Florinef.   CURRENT MEDICATIONS:  1.  KCl 20 mEq a day.  2.  Florinef 0.1 mg one every other day alternating with 0.2 mg every other      day.  3.  Amiodarone 100 mg per day.  4.  Atenolol 50 mg in the morning and 25 mg at night.  5.  Isosorbide mononitrate 30 mg per day.  6.  Nitroglycerin sublingually p.r.n.   ALLERGIES:  CODEINE, DARVON, PENICILLIN.   PAST SURGICAL HISTORY:  She has had previous fractured bones in her left  foot, right elbow and back.   FAMILY HISTORY:  Father died of an MI at age 80 but had previous MI.  Mother  died at 67 of natural causes.  One brother has had an ICD and died.  Another  brother died in his 82s of MI.  One sister has had bypass surgery.   SOCIAL HISTORY:  She has been a widow for nine years.  She has two children,  four grandchildren  and four great grandchildren.  She baby sits for her  great grandchildren at different times.  No cigarettes, no alcohol.  She  remains active.   REVIEW OF SYSTEMS:  She has had low blood pressure, she has had some eye  problems with a detached retina and has pending corneal surgery.  Pulmonary:  Negative.  Gastrointestinal:  Chronic constipation.  Genitourinary:  Negative.  Endocrine:  Negative.  Dermatologic:  Basically negative.   PHYSICAL EXAMINATION ON ADMISSION:  VITAL SIGNS:  Heart rate 120-130 and  irregularly irregular, consistent with atrial fibrillation; blood pressure  98/60.  HEENT:  Dentures.  LUNGS:  Clear.  HEART:  Irregularly irregular rhythm.  ABDOMEN:  Soft, nontender, no masses, normal bowel sounds.  EXTREMITIES:  Without edema.   EKG shows atrial fibrillation with rapid ventricular response.  Pertinent  labs show a hematocrit of 42, potassium of 2.5, BUN 15, glucose 91.   IMPRESSION:  1.  Atrial fibrillation with rapid ventricular response.  2.  History of normal coronary arteries.  3.  Chest pain syndrome, possibly associated  with the atrial fibrillation.  4.  History of atrioventricular nodal ablation in 1996.       SNT/MEDQ  D:  08/11/2004  T:  08/11/2004  Job:  [id number]

## 2010-07-22 NOTE — Consult Note (Signed)
NAME:  Katrina Rodriguez, Katrina Rodriguez                             ACCOUNT NO.:  [id number]   MEDICAL RECORD NO.:  [id number]                   PATIENT TYPE:  EMS   LOCATION:  MAJO                                 FACILITY:  [facility]   PHYSICIAN:  Vesta Mixer, M.D.              DATE OF BIRTH:  01-10-1929   DATE OF CONSULTATION:  02/06/2002  DATE OF DISCHARGE:                                   CONSULTATION   HISTORY OF PRESENT ILLNESS:  The patient is an elderly female with a history  of syncope, noncardiac chest pain and supraventricular tachycardia/atrial  fibrillation/atrial flutter.  The patient was brought to the emergency room  for episodes of chest pain and tachy palpitations.   The patient has a long history of supraventricular tachycardia including  atrial flutter and probable atrial fibrillation.  She has been on Rhythmol  for quite some time.  She also has had episodes of syncope which at least  partially appear to be due to orthostatic hypotension as well as some  neurogenic syncope.   The patient has recently had some flulike symptoms and was started on some  antibiotics.  The patient awoke last night with some episodes of chest pain  as well as tachy palpitations.  She states that the chest pains were worse  than they had been before, so she presented to the emergency room.  In the  emergency room she was found to be somewhat hypotensive with a systolic  blood pressure of 80.  She was also found to be tachycardic with atrial  fibrillation with rapid ventricular response of 147.   The patient was seen for further evaluation.   CURRENT MEDICATIONS:  1. Rhythmol 150 mg three times a day.  2. Florinef two tablets alternate with one tablet each day.  3. Tenormin 25 mg a day.  4. Potassium chloride 10 mEq a day.  5. Aspirin once a day.   ALLERGIES:  She is allergic to CODEINE, DEMEROL, and DARVOCET.   PAST MEDICAL HISTORY:  1. Syncope.  2. Supraventricular tachycardia.   SOCIAL  HISTORY:  The patient is a nonsmoker, nondrinker.   FAMILY HISTORY:  Noncontributory.   REVIEW OF SYSTEMS:  The patient has had chest pain with palpitations in the  past.   PHYSICAL EXAMINATION:  GENERAL APPEARANCE:  She is an elderly female in no  acute distress.  VITAL SIGNS:  Her current heart rate is in the 70s.  Her blood pressure is  100/70.  NECK:  There are 2+ carotids.  She has no bruits.  There is no JVD and no  thyromegaly.  LUNGS:  Clear to auscultation.  CARDIOVASCULAR:  Regular rate, S1 and S2.  She has no murmurs, rubs, or  gallops.  ABDOMEN:  Good bowel sounds and nontender.  EXTREMITIES:  There is no clubbing, cyanosis, or edema, no tenderness.  NEUROLOGIC:  Nonfocal.   Her  initial EKG reveals atrial fibrillation with a rapid ventricular  response.  Her second EKG reveals normal sinus rhythm.  There are no ST or T-  wave changes.   Her initial set of cardiac enzymes are negative.  Her sodium is 142,  potassium 3.1, chloride 107, CO2 25.  Hematocrit 41% with hemoglobin of 14.   ASSESSMENT:  The patient presents with some tachy palpitations as well as  chest pain.  Her initial cardiac enzymes are negative.  We will collect one  additional set of cardiac enzymes.  She is somewhat hypokalemic, probably as  a result of her Florinef.  She is already on potassium replacement.  We will  have her double this to 20 mEq a day.  We will start her on digoxin 0.125 mg  a day to help control her ventricular response when she does go into atrial  fibrillation.  If her second set of enzymes are negative, then she will be  able to go home from the emergency room.  She will see Dr. Swaziland in the  office in a day or so and we will do a stress Cardiolite study.  We will  have her return to the emergency room if she has recurrence of the chest  pains or supraventricular tachycardia.                                                Vesta Mixer, M.D.    PJN/MEDQ  D:   02/06/2002  T:  02/06/2002  Job:  [id number]   cc:   [name], M.D.  [address]  [place], [place] [address]  Fax: [phone]

## 2010-07-22 NOTE — H&P (Signed)
NAME:  [name], [name] NO.:  [id number]   MEDICAL RECORD NO.:  [id number]          PATIENT TYPE:  INP   LOCATION:  2041                         FACILITY:  [facility]   PHYSICIAN:  [name], M.D.  DATE OF BIRTH:  [birth year]   DATE OF ADMISSION:  09/08/2004  DATE OF DISCHARGE:                                HISTORY & PHYSICAL   HISTORY OF PRESENT ILLNESS:  Katrina Rodriguez is a 77 year old white female with a  history of tachy-brady syndrome who was admitted for initiation of Tikosyn  therapy.  The patient has a history of recurrent atrial fibrillation with  rapid ventricular response and tachy-brady syndrome.  She also has a history  of supraventricular tachycardia.  She had previously been treated with  propafenone but had break through on this drug.  In 2004, she underwent  placement of a DDD pacemaker and was placed on Amiodarone.  In June 2006,  the patient was admitted with recurrent atrial fibrillation and rapid  ventricular response associated with syncope and chest pain.  She was found  to be hyperthyroid due to her Amiodarone.  The Amiodarone was discontinued  and she was treated for rate control with Atenolol and Lanoxin.  Despite  this therapy, she has continued to have frequent break through arrhythmias  associated with chest pain and dizziness.  Repeat lab work done on June 30  showed Amiodarone level that was less than 0.3 mcg per mL and she is now  admitted for alternative anti-arrhythmic drug therapy.   PAST MEDICAL HISTORY:  1.  Tachy-brady syndrome.  2.  Status post DDD pacemaker implant in 2004.  3.  History of chest pain with normal cardiac catheterization in January      2005.  4.  Recurrent syncope.  5.  Orthostatic hypotension.  6.  Hyperthyroidism.  7.  Prior fractures including left foot, right elbow, and back.   CURRENT MEDICATIONS:  Aspirin 81 mg daily, Florinef 0.2 mg b.i.d., Atenolol  50 mg b.i.d., potassium 20 mEq daily, Lanoxin 0.125  mg daily.   ALLERGIES:  Codeine, Darvon, penicillin, and to Demerol.   SOCIAL HISTORY:  The patient lives alone, she is widowed, she has two  children.  She denies tobacco or alcohol use.   FAMILY HISTORY:  Father died of myocardial infarction at age 16, mother died  at age 72 of natural causes, one brother died in his 8s of myocardial  infarction, one brother died after an ICD placement, one sister is status  post CABG.   PHYSICAL EXAMINATION:  GENERAL:  The patient is a pleasant, elderly white female, in no apparent  distress.  VITAL SIGNS:  Blood pressure 110/70, pulse 80 and regular, she is DDD paced,  respirations 20.  HEENT:  Normocephalic, atraumatic, pupils equal, round, reactive, oropharynx  clear.  NECK:  Without JVD, adenopathy, thyromegaly, or bruits.  LUNGS:  Clear.  HEART:  Regular rate and rhythm without murmurs, gallops, rubs, or click.  ABDOMEN:  Soft, nontender, she has no masses or bruits.  EXTREMITIES:  Without edema, pulses 2+ and symmetric.  NEUROLOGICAL:  Nonfocal.   LABORATORY DATA:  September 02, 2004, digoxin level 1.6, Amiodarone level less  than 0.3 ng per mL.   IMPRESSION:  1.  Tachy-brady syndrome status post DDD pacemaker implant, failed therapy      with Amiodarone and propafenone.  2.  Recurrent syncope.  3.  Orthostatic hypotension.  4.  History of chest pain with normal cardiac catheterization.  5.  Hyperthyroidism.   PLAN:  Will check baseline BMP, magnesium level, CBC, and thyroid studies.  Will obtain a 12 lead EKG.  Plan on initiation of anti-arrhythmic drug  therapy with Tikosyn.       PMJ/MEDQ  D:  09/08/2004  T:  09/08/2004  Job:  [id number]

## 2010-07-22 NOTE — Discharge Summary (Signed)
NAME:  Katrina Rodriguez, Katrina Rodriguez                             ACCOUNT NO.:  [id number]   MEDICAL RECORD NO.:  [id number]                   PATIENT TYPE:  INP   LOCATION:  2040                                 FACILITY:  [facility]   PHYSICIAN:  [name], M.D.               DATE OF BIRTH:  1928/03/08   DATE OF ADMISSION:  05/11/2002  DATE OF DISCHARGE:  05/14/2002                                 DISCHARGE SUMMARY   HISTORY OF PRESENT ILLNESS:  The patient is a 77 year old white female who  presented with an episode of tachypalpitations associated with prolonged  chest pain radiating down her left arm.  She also had shortness of breath.  She has a known history of syncope with a history of prior atrial  fibrillation, supraventricular tachycardia, and orthostatic hypotension.  She had been on chronic therapy with Rhythmol, Tenormin, and Lanoxin, but  has had more frequent breakthrough episodes recently.  On the night of her  admission, she was wearing an event monitor which documented that she was in  atrial fibrillation with a rapid ventricular response with rates 160-180.  For details of her past medical history, social history, family history, and  physical exam, please see the admission history and physical.   LABORATORY DATA:  The white count is 5900, hemoglobin 13.1, hematocrit 37.7,  and platelets 158,000.  The PT is 15.3, INR 1.2, and PTT 27.  Sodium 136,  potassium 3.5, chloride 104, CO2 27, BUN 15, creatinine 0.9, glucose 112.  Liver function studies were normal, except for a total protein of 5.3 and an  albumin of 3.2.  The CK was 114 with negative MB and troponin 0.01.  The  cholesterol was 188, triglycerides 97, HDL 47, and LDL 122.  The TSH was  normal at 1.88.  The digoxin level was less than 0.2.  The ECG showed sinus  bradycardia, otherwise normal.  The chest x-ray showed no active disease.   HOSPITAL COURSE:  The patient was admitted and initially placed on Lovenox.  Her chest  pain resolved.  She remained in sinus rhythm throughout her  hospital stay, but developed periods of profound sinus bradycardia with  heart rate down to 29.  She also had some 3-second pauses.  She subsequently  underwent an adenosine Cardiolite study, which was a normal study with an  ejection fraction of 84%.  Because of her bradycardia, her Rhythmol,  Lanoxin, and atenolol were discontinued.  She was seen in consultation by  Duke Salvia, M.D., and a permanent transvenous pacemaker was  recommended.  She underwent placement of a permanent transvenous pacemaker  on May 13, 2002, with an Impulse [employer]onic DDD pulse generator.  She  tolerated the procedure well without any recurrent arrhythmias and was  atrially paced at 60 beats per minute. She had no complications at her pacer  site.  It is recommended that she  be placed on amiodarone beginning at 600  mg per day.  She was continued on atenolol.  She had no further  tachypalpitations or arrhythmias.  She was discharged home in stable  condition on May 14, 2002.   DISCHARGE DIAGNOSES:  1. Tachy/brady syndrome with atrial fibrillation with rapid ventricular     response, as well as periods of marked bradycardia.  2. Long history of syncope.  3. History of supraventricular tachycardia.  4. Orthostatic hypotension.   DISCHARGE MEDICATIONS:  1. Potassium 20 mEq daily.  2. Florinef 0.1 mg alternating with 0.2 mg daily.  3. Aspirin 325 mg daily.  4. Amiodarone 200 mg three times a day.  5. Atenolol 50 mg per day.   SPECIAL INSTRUCTIONS:  She is instructed to stop her Rhythmol and Lanoxin.  She was given routine post pacemaker instructions.   FOLLOW-UP:  She will follow up with [name], M.D., in one week.  Will arrange pulmonary function studies as an outpatient.   DISCHARGE STATUS:  Improved.                                               [name], M.D.    PMJ/MEDQ  D:  05/14/2002  T:  05/14/2002  Job:   [id number]   cc:   Duke Salvia, M.D. [facility]

## 2010-08-04 ENCOUNTER — Other Ambulatory Visit: Payer: Self-pay | Admitting: Internal Medicine

## 2010-08-04 ENCOUNTER — Ambulatory Visit (INDEPENDENT_AMBULATORY_CARE_PROVIDER_SITE_OTHER): Payer: Medicare Other | Admitting: *Deleted

## 2010-08-04 DIAGNOSIS — I495 Sick sinus syndrome: Secondary | ICD-10-CM

## 2010-08-10 NOTE — Progress Notes (Signed)
PACER REMOTE 

## 2010-08-15 ENCOUNTER — Ambulatory Visit (INDEPENDENT_AMBULATORY_CARE_PROVIDER_SITE_OTHER): Payer: Medicare Other | Admitting: *Deleted

## 2010-08-15 DIAGNOSIS — Z7901 Long term (current) use of anticoagulants: Secondary | ICD-10-CM

## 2010-08-15 DIAGNOSIS — I4891 Unspecified atrial fibrillation: Secondary | ICD-10-CM

## 2010-08-16 ENCOUNTER — Encounter: Payer: Self-pay | Admitting: *Deleted

## 2010-08-16 LAB — POCT INR: INR: 2.4

## 2010-09-12 ENCOUNTER — Ambulatory Visit (INDEPENDENT_AMBULATORY_CARE_PROVIDER_SITE_OTHER): Payer: Medicare Other | Admitting: *Deleted

## 2010-09-12 ENCOUNTER — Telehealth: Payer: Self-pay | Admitting: Cardiology

## 2010-09-12 DIAGNOSIS — Z7901 Long term (current) use of anticoagulants: Secondary | ICD-10-CM

## 2010-09-12 DIAGNOSIS — I4891 Unspecified atrial fibrillation: Secondary | ICD-10-CM

## 2010-09-12 MED ORDER — SIMVASTATIN 10 MG PO TABS: 10.0000 mg | ORAL_TABLET | Freq: Every day | ORAL | Status: DC

## 2010-09-12 NOTE — Telephone Encounter (Signed)
Called to advise of coumadin dose and gave appointment for coumadin. Also needs rx sent [facility] for simvastatin.

## 2010-09-12 NOTE — Telephone Encounter (Signed)
Patient called, said she was returning nurses call.

## 2010-10-12 ENCOUNTER — Encounter: Payer: Self-pay | Admitting: Cardiology

## 2010-10-12 ENCOUNTER — Ambulatory Visit (INDEPENDENT_AMBULATORY_CARE_PROVIDER_SITE_OTHER): Payer: Medicare Other | Admitting: *Deleted

## 2010-10-12 ENCOUNTER — Ambulatory Visit (INDEPENDENT_AMBULATORY_CARE_PROVIDER_SITE_OTHER): Payer: Medicare Other | Admitting: Cardiology

## 2010-10-12 DIAGNOSIS — I4891 Unspecified atrial fibrillation: Secondary | ICD-10-CM

## 2010-10-12 DIAGNOSIS — R55 Syncope and collapse: Secondary | ICD-10-CM

## 2010-10-12 DIAGNOSIS — Z8673 Personal history of transient ischemic attack (TIA), and cerebral infarction without residual deficits: Secondary | ICD-10-CM

## 2010-10-12 DIAGNOSIS — Z950 Presence of cardiac pacemaker: Secondary | ICD-10-CM

## 2010-10-12 MED ORDER — FLUDROCORTISONE ACETATE 0.1 MG PO TABS: 0.2000 mg | ORAL_TABLET | Freq: Two times a day (BID) | ORAL | Status: DC

## 2010-10-12 NOTE — Assessment & Plan Note (Signed)
She is scheduled for pacemaker followup next month.

## 2010-10-12 NOTE — Assessment & Plan Note (Signed)
She has some residual left-sided weakness from her stroke. With her decreased vision she needs to be especially careful to avoid falls.

## 2010-10-12 NOTE — Assessment & Plan Note (Signed)
She is status post radiofrequency ablation. She has had no recurrent atrial fibrillation since that procedure. She has been off of Tikosyn since February. We will continue with her anticoagulation.

## 2010-10-12 NOTE — Assessment & Plan Note (Signed)
This is multifactorial including a history of orthostatic hypotension. We lowered to her Florinef today. I recommended that she use a cane when she walks.

## 2010-10-12 NOTE — Progress Notes (Signed)
   Katrina Rodriguez Date of Birth: [birth year]   History of Present Illness: Katrina Rodriguez is seen for followup today. In general she has done well. She still very limited by her eyesight. She did have a fall one month ago where she states her left leg just gave way and she fell against her bathtub injuring her right shoulder. This has improved but she still has some difficulty lifting her right arm above her head. She has had some mild palpitations. She has had no sustained arrhythmias. Last week she did complain of some shortness of breath and nausea with some jaw pain. She has had a normal cardiac catheterization in the past and adenosine Cardiolite study in 2010 and was normal.  Current Outpatient Prescriptions on File Prior to Visit  Medication Sig Dispense Refill  . simvastatin (ZOCOR) 10 MG tablet Take 1 tablet (10 mg total) by mouth at bedtime.  90 tablet  3  . warfarin (COUMADIN) 6 MG tablet Take 6 mg by mouth as directed.        Marland Kitchen DISCONTD: fludrocortisone (FLORINEF) 0.1 MG tablet Take 0.2 mg by mouth 2 (two) times daily.          Allergies  Allergen Reactions  . Amoxicillin   . Codeine   . Darvocet (Propoxyphene N-Acetaminophen)   . Demerol   . Penicillins     Past Medical History  Diagnosis Date  . Atrial fibrillation   . Thrombocytopenia   . H/O: CVA (cardiovascular accident)     right internal capsule  . Orthostasis   . Anticoagulant long-term use   . Pacemaker   . Tachycardia-bradycardia syndrome   . Mild pulmonary hypertension   . Chronic anemia   . Syncope, cardiogenic   . Chest pain   . Palpitation   . Hyperthyroidism   . Retinal detachment     Past Surgical History  Procedure Date  . Cardiac pacemaker placement   . Cardiac catheterization   . Retinal detachment surgery     History  Smoking status  . Never Smoker   Smokeless tobacco  . Never Used    History  Alcohol Use No    Family History  Problem Relation Age of Onset  . Heart disease Father   .  Heart disease Brother   . Heart disease Brother     Review of Systems: The review of systems is positive for fluctuating blood pressure.  She has intermittent pounding in her heart and some dizziness. She has had no recurrent TIA or CVA symptoms.All other systems were reviewed and are negative.  Physical Exam: BP 138/90  Pulse 79  Ht 5\' 6"  (1.676 m)  Wt 109 lb (49.442 kg)  BMI 17.59 kg/m2 She is a pleasant elderly white female in no acute distress. 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 Her oropharynx is clear. Neck is supple no JVD or bruits. Lungs are clear. Cardiac exam reveals a regular rate and rhythm without gallop or murmur. Abdomen is soft and nontender. She has no significant edema. She has good coordination and strength today. LABORATORY DATA: INR is 2.8 today. ECG demonstrates normal sinus rhythm with mild nonspecific ST abnormality.  Assessment / Plan:

## 2010-10-12 NOTE — Patient Instructions (Signed)
Continue your current medications.  We will  Check your coumadin again in 4 weeks.  I will see you again in 3 months.

## 2010-10-13 ENCOUNTER — Encounter: Payer: Self-pay | Admitting: Cardiology

## 2010-11-03 ENCOUNTER — Ambulatory Visit (INDEPENDENT_AMBULATORY_CARE_PROVIDER_SITE_OTHER): Payer: Medicare Other | Admitting: *Deleted

## 2010-11-03 ENCOUNTER — Encounter: Payer: Self-pay | Admitting: Internal Medicine

## 2010-11-03 ENCOUNTER — Other Ambulatory Visit: Payer: Self-pay

## 2010-11-03 DIAGNOSIS — I4891 Unspecified atrial fibrillation: Secondary | ICD-10-CM

## 2010-11-04 LAB — REMOTE PACEMAKER DEVICE
AL AMPLITUDE: 2.8 mv
ATRIAL PACING PM: 10
BATTERY VOLTAGE: 2.72 V
BMOD-0005RA: 80 {beats}/min

## 2010-11-09 ENCOUNTER — Encounter: Payer: Self-pay | Admitting: *Deleted

## 2010-11-09 ENCOUNTER — Ambulatory Visit (INDEPENDENT_AMBULATORY_CARE_PROVIDER_SITE_OTHER): Payer: Medicare Other | Admitting: *Deleted

## 2010-11-09 DIAGNOSIS — I4891 Unspecified atrial fibrillation: Secondary | ICD-10-CM

## 2010-11-09 DIAGNOSIS — Z7901 Long term (current) use of anticoagulants: Secondary | ICD-10-CM

## 2010-11-09 LAB — POCT INR: INR: 2

## 2010-11-18 NOTE — Progress Notes (Signed)
Pacer checked by remote 

## 2010-11-28 ENCOUNTER — Telehealth: Payer: Self-pay | Admitting: Cardiology

## 2010-11-28 LAB — I-STAT 8, (EC8 V) (CONVERTED LAB)
BUN: 15
Bicarbonate: 30.9 — ABNORMAL HIGH
HCT: 42
Hemoglobin: 14.3
Operator id: 294501
Potassium: 4.5
Sodium: 140
TCO2: 33

## 2010-11-28 LAB — POCT I-STAT CREATININE: Operator id: 294501

## 2010-11-28 LAB — CBC
HCT: 41.9
Hemoglobin: 14.2
Platelets: 132 — ABNORMAL LOW
RBC: 4.44
WBC: 6.1

## 2010-11-28 LAB — DIFFERENTIAL
Eosinophils Relative: 1
Lymphocytes Relative: 20
Lymphs Abs: 1.2
Monocytes Relative: 8

## 2010-11-28 LAB — D-DIMER, QUANTITATIVE: D-Dimer, Quant: 0.53 — ABNORMAL HIGH

## 2010-11-28 MED ORDER — WARFARIN SODIUM 6 MG PO TABS: 6.0000 mg | ORAL_TABLET | ORAL | Status: DC

## 2010-11-28 NOTE — Telephone Encounter (Signed)
Pt wants refill warfarin [facility] [phone]

## 2010-11-28 NOTE — Telephone Encounter (Signed)
Refill coumadin

## 2010-12-02 ENCOUNTER — Inpatient Hospital Stay (HOSPITAL_COMMUNITY)
Admission: EM | Admit: 2010-12-02 | Discharge: 2010-12-06 | DRG: 310 | Disposition: A | Discharge status: Home / Self Care | Payer: Medicare Other | Attending: Internal Medicine | Admitting: Internal Medicine

## 2010-12-02 DIAGNOSIS — Z7901 Long term (current) use of anticoagulants: Secondary | ICD-10-CM

## 2010-12-02 DIAGNOSIS — I2510 Atherosclerotic heart disease of native coronary artery without angina pectoris: Secondary | ICD-10-CM | POA: Diagnosis present

## 2010-12-02 DIAGNOSIS — Z950 Presence of cardiac pacemaker: Secondary | ICD-10-CM

## 2010-12-02 DIAGNOSIS — Z8673 Personal history of transient ischemic attack (TIA), and cerebral infarction without residual deficits: Secondary | ICD-10-CM

## 2010-12-02 DIAGNOSIS — I9589 Other hypotension: Secondary | ICD-10-CM | POA: Diagnosis present

## 2010-12-02 DIAGNOSIS — R51 Headache: Secondary | ICD-10-CM | POA: Diagnosis not present

## 2010-12-02 DIAGNOSIS — I4891 Unspecified atrial fibrillation: Principal | ICD-10-CM | POA: Diagnosis present

## 2010-12-02 DIAGNOSIS — R079 Chest pain, unspecified: Secondary | ICD-10-CM

## 2010-12-02 DIAGNOSIS — Z79899 Other long term (current) drug therapy: Secondary | ICD-10-CM

## 2010-12-02 DIAGNOSIS — E876 Hypokalemia: Secondary | ICD-10-CM | POA: Diagnosis present

## 2010-12-02 DIAGNOSIS — D696 Thrombocytopenia, unspecified: Secondary | ICD-10-CM | POA: Diagnosis present

## 2010-12-02 DIAGNOSIS — E785 Hyperlipidemia, unspecified: Secondary | ICD-10-CM | POA: Diagnosis present

## 2010-12-02 LAB — DIFFERENTIAL
Basophils Absolute: 0 10*3/uL (ref 0.0–0.1)
Eosinophils Relative: 0 % (ref 0–5)
Lymphocytes Relative: 15 % (ref 12–46)
Lymphocytes Relative: 18 % (ref 12–46)
Lymphs Abs: 1 10*3/uL (ref 0.7–4.0)
Monocytes Absolute: 0.5 10*3/uL (ref 0.1–1.0)
Neutrophils Relative %: 75 % (ref 43–77)

## 2010-12-02 LAB — BASIC METABOLIC PANEL
BUN: 13 mg/dL (ref 6–23)
CO2: 29 mEq/L (ref 19–32)
CO2: 23 mEq/L (ref 19–32)
Calcium: 7.9 mg/dL — ABNORMAL LOW (ref 8.4–10.5)
Calcium: 7.9 mg/dL — ABNORMAL LOW (ref 8.4–10.5)
Creatinine, Ser: 0.8 mg/dL (ref 0.50–1.10)
Creatinine, Ser: 0.79 mg/dL (ref 0.50–1.10)
GFR calc non Af Amer: 57 mL/min — ABNORMAL LOW (ref >60)
GFR calc non Af Amer: >60 mL/min (ref >60)
Glucose, Bld: 113 mg/dL — ABNORMAL HIGH (ref 70–99)
Glucose, Bld: 109 mg/dL — ABNORMAL HIGH (ref 70–99)
Glucose, Bld: 92 mg/dL (ref 70–99)
Potassium: 3.4 mEq/L — ABNORMAL LOW (ref 3.5–5.1)
Sodium: 143 mEq/L (ref 135–145)
Sodium: 142 mEq/L (ref 135–145)

## 2010-12-02 LAB — CARDIAC PANEL(CRET KIN+CKTOT+MB+TROPI)
CK, MB: 3.2 ng/mL (ref 0.3–4.0)
Relative Index: INVALID (ref 0.0–2.5)
Total CK: 72 U/L (ref 7–177)
Troponin I: <0.3 ng/mL (ref <0.30)

## 2010-12-02 LAB — CBC
HCT: 37.7 % (ref 36.0–46.0)
HCT: 35.8 % — ABNORMAL LOW (ref 36.0–46.0)
Hemoglobin: 12.9 g/dL (ref 12.0–15.0)
MCHC: 33.5 g/dL (ref 30.0–36.0)
MCV: 92.6 fL (ref 78.0–100.0)
RBC: 4.07 MIL/uL (ref 3.87–5.11)
RDW: 13.2 % (ref 11.5–15.5)
WBC: 6.4 10*3/uL (ref 4.0–10.5)

## 2010-12-02 LAB — CK TOTAL AND CKMB (NOT AT ARMC)
CK, MB: 2.4 ng/mL (ref 0.3–4.0)
Relative Index: INVALID (ref 0.0–2.5)

## 2010-12-02 LAB — MAGNESIUM: Magnesium: 2.1 mg/dL (ref 1.5–2.5)

## 2010-12-02 LAB — POCT I-STAT TROPONIN I

## 2010-12-02 LAB — PROTIME-INR: Prothrombin Time: 23.1 seconds — ABNORMAL HIGH (ref 11.6–15.2)

## 2010-12-02 LAB — LIPID PANEL: LDL Cholesterol: 71 mg/dL (ref 0–99)

## 2010-12-03 DIAGNOSIS — I4891 Unspecified atrial fibrillation: Secondary | ICD-10-CM

## 2010-12-03 LAB — BASIC METABOLIC PANEL
CO2: 25 mEq/L (ref 19–32)
Calcium: 8 mg/dL — ABNORMAL LOW (ref 8.4–10.5)
Creatinine, Ser: 0.84 mg/dL (ref 0.50–1.10)
GFR calc non Af Amer: >60 mL/min (ref >60)
Sodium: 143 mEq/L (ref 135–145)

## 2010-12-03 LAB — PROTIME-INR
INR: 2.42 — ABNORMAL HIGH (ref 0.00–1.49)
Prothrombin Time: 26.7 seconds — ABNORMAL HIGH (ref 11.6–15.2)

## 2010-12-04 LAB — BASIC METABOLIC PANEL
BUN: 9 mg/dL (ref 6–23)
Calcium: 8.2 mg/dL — ABNORMAL LOW (ref 8.4–10.5)
Chloride: 109 mEq/L (ref 96–112)
Creatinine, Ser: 0.78 mg/dL (ref 0.50–1.10)
GFR calc Af Amer: >60 mL/min (ref >60)

## 2010-12-04 LAB — PROTIME-INR
INR: 2.51 — ABNORMAL HIGH (ref 0.00–1.49)
Prothrombin Time: 27.5 seconds — ABNORMAL HIGH (ref 11.6–15.2)

## 2010-12-05 DIAGNOSIS — I4891 Unspecified atrial fibrillation: Secondary | ICD-10-CM

## 2010-12-05 LAB — BASIC METABOLIC PANEL
CO2: 29 mEq/L (ref 19–32)
Calcium: 8.3 mg/dL — ABNORMAL LOW (ref 8.4–10.5)
Creatinine, Ser: 0.85 mg/dL (ref 0.50–1.10)
GFR calc non Af Amer: 63 mL/min — ABNORMAL LOW (ref >90)
Glucose, Bld: 94 mg/dL (ref 70–99)

## 2010-12-05 LAB — CBC
HCT: 37 % (ref 36.0–46.0)
MCHC: 33.5 g/dL (ref 30.0–36.0)
MCV: 93.4 fL (ref 78.0–100.0)
Platelets: 104 10*3/uL — ABNORMAL LOW (ref 150–400)
RDW: 13.1 % (ref 11.5–15.5)

## 2010-12-05 LAB — PROTIME-INR: INR: 2.69 — ABNORMAL HIGH (ref 0.00–1.49)

## 2010-12-06 LAB — DIGOXIN LEVEL: Digoxin Level: 0.4 — ABNORMAL LOW

## 2010-12-06 LAB — BASIC METABOLIC PANEL
BUN: 9 mg/dL (ref 6–23)
BUN: 16
BUN: 16
Calcium: 8.8 mg/dL (ref 8.4–10.5)
Calcium: 8.6
Chloride: 112
Creatinine, Ser: 0.99
GFR calc non Af Amer: 56 mL/min — ABNORMAL LOW (ref >90)
GFR calc non Af Amer: 54 — ABNORMAL LOW
Glucose, Bld: 103 mg/dL — ABNORMAL HIGH (ref 70–99)
Glucose, Bld: 111 — ABNORMAL HIGH
Glucose, Bld: 99
Potassium: 3.6

## 2010-12-06 LAB — COMPREHENSIVE METABOLIC PANEL
BUN: 15
Calcium: 8.3 — ABNORMAL LOW
Glucose, Bld: 108 — ABNORMAL HIGH
Sodium: 141
Total Protein: 5.4 — ABNORMAL LOW

## 2010-12-06 LAB — CBC
HCT: 34.4 — ABNORMAL LOW
HCT: 38.7
Hemoglobin: 13.2
MCHC: 34.2
MCV: 94.6
Platelets: 137 — ABNORMAL LOW
Platelets: 99 — ABNORMAL LOW
RDW: 12.6
RDW: 12.8
RDW: 12.9

## 2010-12-06 LAB — DIFFERENTIAL
Basophils Absolute: 0.1
Eosinophils Relative: 2
Lymphocytes Relative: 39
Neutro Abs: 3.3
Neutrophils Relative %: 49

## 2010-12-06 LAB — MAGNESIUM: Magnesium: 2.3

## 2010-12-06 LAB — APTT: aPTT: 26

## 2010-12-06 LAB — GLUCOSE, CAPILLARY: Glucose-Capillary: 107 — ABNORMAL HIGH

## 2010-12-06 LAB — CARDIAC PANEL(CRET KIN+CKTOT+MB+TROPI): Total CK: 57

## 2010-12-06 LAB — POCT CARDIAC MARKERS
CKMB, poc: <1 — ABNORMAL LOW
Troponin i, poc: <0.05
Troponin i, poc: <0.05

## 2010-12-06 LAB — TSH: TSH: 3.783

## 2010-12-07 ENCOUNTER — Encounter: Payer: Medicare Other | Admitting: *Deleted

## 2010-12-07 ENCOUNTER — Telehealth: Payer: Self-pay | Admitting: Cardiology

## 2010-12-07 NOTE — Telephone Encounter (Signed)
K+ was written for but it doesn't come in that it only comes in so they wanted to confirm what md wanted. Per Pharm they called yesterday and pt has already called them back but we have not responded

## 2010-12-07 NOTE — Telephone Encounter (Signed)
Pharmacy called to clarify dose of KCL. Per Dr. Swaziland advised to take 2 - of KCL daily

## 2010-12-09 ENCOUNTER — Ambulatory Visit (INDEPENDENT_AMBULATORY_CARE_PROVIDER_SITE_OTHER): Payer: Medicare Other | Admitting: *Deleted

## 2010-12-09 DIAGNOSIS — I4891 Unspecified atrial fibrillation: Secondary | ICD-10-CM

## 2010-12-09 DIAGNOSIS — Z7901 Long term (current) use of anticoagulants: Secondary | ICD-10-CM

## 2010-12-22 ENCOUNTER — Ambulatory Visit (INDEPENDENT_AMBULATORY_CARE_PROVIDER_SITE_OTHER): Payer: Medicare Other | Admitting: Nurse Practitioner

## 2010-12-22 ENCOUNTER — Ambulatory Visit (INDEPENDENT_AMBULATORY_CARE_PROVIDER_SITE_OTHER): Payer: Medicare Other | Admitting: *Deleted

## 2010-12-22 ENCOUNTER — Encounter: Payer: Self-pay | Admitting: Nurse Practitioner

## 2010-12-22 ENCOUNTER — Encounter: Payer: Self-pay | Admitting: Internal Medicine

## 2010-12-22 VITALS — BP 106/80 | HR 61 | Ht 65.0 in | Wt 111.4 lb

## 2010-12-22 DIAGNOSIS — I4891 Unspecified atrial fibrillation: Secondary | ICD-10-CM

## 2010-12-22 DIAGNOSIS — Z7901 Long term (current) use of anticoagulants: Secondary | ICD-10-CM

## 2010-12-22 DIAGNOSIS — I495 Sick sinus syndrome: Secondary | ICD-10-CM

## 2010-12-22 DIAGNOSIS — I480 Paroxysmal atrial fibrillation: Secondary | ICD-10-CM

## 2010-12-22 LAB — BASIC METABOLIC PANEL
CO2: 30 mEq/L (ref 19–32)
Calcium: 8.8 mg/dL (ref 8.4–10.5)
Creatinine, Ser: 1.3 mg/dL — ABNORMAL HIGH (ref 0.4–1.2)
Glucose, Bld: 84 mg/dL (ref 70–99)
Sodium: 144 mEq/L (ref 135–145)

## 2010-12-22 LAB — PACEMAKER DEVICE OBSERVATION
AL IMPEDENCE PM: 642 Ohm
BATTERY VOLTAGE: 2.72 V
BMOD-0003RA: 15
BMOD-0005RA: 80 {beats}/min
BRDY-0002RA: 50 {beats}/min

## 2010-12-22 MED ORDER — WARFARIN SODIUM 4 MG PO TABS: 4.0000 mg | ORAL_TABLET | ORAL | Status: DC

## 2010-12-22 MED ORDER — DILTIAZEM HCL ER COATED BEADS 120 MG PO CP24: 120.0000 mg | ORAL_CAPSULE | Freq: Every day | ORAL | Status: DC

## 2010-12-22 MED ORDER — POTASSIUM CHLORIDE CRYS ER 20 MEQ PO TBCR: 20.0000 meq | EXTENDED_RELEASE_TABLET | Freq: Every day | ORAL | Status: DC

## 2010-12-22 NOTE — Assessment & Plan Note (Addendum)
I have discussed her care with Dr. Swaziland. She is not felt to be a candidate for beta blocker due to her blood pressure. She is on chronic florinef. She may have had some issue with Digoxin in the past. That is not clear. We will try her on low dose Cardizem at 120 mg daily. She has an appointment back in November with Dr. Swaziland. She does have a history of normal cath in 2005 and negative stress test in 2010. Coumadin will be continued. BMET is checked today to relook at potassium levels. May need her VHR detection rate lowered.  Patient is agreeable to this plan and will call if any problems develop in the interim.

## 2010-12-22 NOTE — Patient Instructions (Addendum)
Stay on your current medicines for now but we are going to add Cardizem CD 120 mg daily. This is to help with the fast heart rate.  This prescription is at [facility].  I refilled your potassium and coumadin to your mail order.   We will see what your labs look like from today.  We will see you back as scheduled with Dr. Swaziland.

## 2010-12-22 NOTE — Progress Notes (Signed)
PPM check add on

## 2010-12-22 NOTE — Progress Notes (Signed)
Katrina Rodriguez Date of Birth: Jun 06, 1928 Medical Record #[id number]  History of Present Illness: Katrina Rodriguez is seen today for her post hospital visit. She is seen for Dr. Swaziland. She was recently hospitalized with recurrent PAF. She remains on her Coumadin. She was put back on Tikosyn. Has pacemaker in place. She has had longstanding issues with low blood pressure and is on Florinef chronically. She did have issue with potassium levels during this last admission. We will recheck today.   She comes in today and says she continues to not do well. She feels like her heart is doing crazy things. She is having more chest tightness. Says she can't walk to the mailbox and back without chest tightness and palpitations. Worst episode was one week ago. Several times, she has almost called EMS.   Current Outpatient Prescriptions on File Prior to Visit  Medication Sig Dispense Refill  . dofetilide (TIKOSYN) 250 MCG capsule Take 250 mcg by mouth 2 (two) times daily.        . fludrocortisone (FLORINEF) 0.1 MG tablet Take 2 tablets (0.2 mg total) by mouth 2 (two) times daily.  90 tablet  3  . simvastatin (ZOCOR) 10 MG tablet Take 1 tablet (10 mg total) by mouth at bedtime.  90 tablet  3  . DISCONTD: potassium chloride SA (K-DUR,KLOR-CON) 20 MEQ tablet Take 20 mEq by mouth daily. Take 2 tablets daily       . DISCONTD: warfarin (COUMADIN) 4 MG tablet Take 4 mg by mouth as directed.        . diltiazem (CARDIZEM CD) 120 MG 24 hr capsule Take 1 capsule (120 mg total) by mouth daily.  30 capsule  11  . DISCONTD: warfarin (COUMADIN) 6 MG tablet Take 1 tablet (6 mg total) by mouth as directed.  90 tablet  0    Allergies  Allergen Reactions  . Amoxicillin   . Codeine   . Darvocet (Propoxyphene N-Acetaminophen)   . Demerol   . Penicillins     Past Medical History  Diagnosis Date  . Atrial fibrillation   . Thrombocytopenia   . H/O: CVA (cardiovascular accident)     right internal capsule  . Orthostasis   .  Anticoagulant long-term use   . Pacemaker   . Tachycardia-bradycardia syndrome   . Mild pulmonary hypertension   . Chronic anemia   . Syncope, cardiogenic   . Chest pain     Has normal coronaries per cath in 2005; negative nuclear in 2010  . Palpitation   . Hyperthyroidism   . Retinal detachment   . High risk medication use     Tikosyn & Coumadin    Past Surgical History  Procedure Date  . Cardiac pacemaker placement   . Cardiac catheterization   . Retinal detachment surgery     History  Smoking status  . Never Smoker   Smokeless tobacco  . Never Used    History  Alcohol Use No    Family History  Problem Relation Age of Onset  . Heart disease Father   . Heart disease Brother   . Heart disease Brother     Review of Systems: The review of systems is positive for chest tightness and palpitations. She is a little unsteady. No falls. No bleeding or bruising.  All other systems were reviewed and are negative.  Physical Exam: BP 106/80  Pulse 61  Ht 5\' 5"  (1.651 m)  Wt 111 lb 6.4 oz (50.531 kg)  BMI 18.54  kg/m2 Patient is very pleasant and in no acute distress. Skin is warm and dry. Color is normal.  HEENT is unremarkable. Normocephalic/atraumatic. PERRL. Sclera are nonicteric. Neck is supple. No masses. No JVD. Lungs are clear. Cardiac exam shows a regular rate and rhythm. Abdomen is soft. Extremities are without edema. Gait and ROM are intact but she is a little unsteady. No gross neurologic deficits noted.   LABORATORY DATA: BMET is pending. EKG shows sinus rhythm. QT is 458 with QTc of 461.   Pacemaker is interrogated. She has had recurrent episodes of PAF with both high atrial and ventricular rates. This was mostly all prior and during hospitalization. Her detection rate is at 150 for [facility].   Assessment / Plan:

## 2010-12-26 ENCOUNTER — Telehealth: Payer: Self-pay | Admitting: *Deleted

## 2010-12-26 MED ORDER — FLUDROCORTISONE ACETATE 0.1 MG PO TABS: ORAL_TABLET | ORAL | Status: DC

## 2010-12-26 NOTE — Discharge Summary (Signed)
NAME:  Katrina Rodriguez, Katrina Rodriguez NO.:  [id number]  MEDICAL RECORD NO.:  [id number]  LOCATION:  2019                         FACILITY:  [facility]  PHYSICIAN:  [name], M.D.  DATE OF BIRTH:  1928-09-10  DATE OF ADMISSION:  12/02/2010 DATE OF DISCHARGE:  12/06/2010                              DISCHARGE SUMMARY   PRIMARY CARDIOLOGIST:  [name], MD  PRIMARY CARE PROVIDER:  Dario Guardian, MD  ELECTROPHYSIOLOGIST:  Hillis Range, MD  DISCHARGE DIAGNOSIS:  Atrial fibrillation with rapid ventricular response.  SECONDARY DIAGNOSES: 1. History of tachy-brady syndrome status post pacemaker placement. 2. Mild nonobstructive coronary artery disease by catheterization     2005. 3. History of cerebrovascular accident in 2011. 4. History of thrombocytopenia. 5. Hypokalemia requiring supplementation.  ALLERGIES:  AMIODARONE cause thyroid disturbance as well as hair changes.  CODEINE, PENICILLIN, DEMEROL, DARVOCET.  PROCEDURES:  None.  HISTORY OF PRESENT ILLNESS:  A 77 year old female with prior history of paroxysmal atrial fibrillation, previously maintained on Tikosyn therapy, however, this was discontinued in January 2012 following AFib ablation in October 2011.  The patient had been maintaining sinus rhythm and has been maintained on Coumadin anticoagulation which is managed at [facility].  She had been doing well without recurrence of AFib but over the past week has had 2-3 episodes of tachy palpitations as well as multiple episodes of syncope for which she did not seek medical care.  Around 9:00 p.m. on the evening of September 27, the patient had recurrent tachy palpitations and chest discomfort which she identified as being related to atrial fibrillation.  She presented to the [facility] ED where she was found to be in AFib with RVR with rates in the 150s-170s and was treated with intravenous diltiazem which subsequently caused hypertension  with pressures in the 60s resulting in diltiazem being discontinued.  Cardiology was consulted and she was admitted for further evaluation.  HOSPITAL COURSE:  The patient was initially loaded with digoxin with some improvement in rate control but ultimately it was felt that she would require reinitiation of Tikosyn therapy which she had tolerated in the past and also was used for maintaining sinus rhythm.  First, however, as she remained in atrial fibrillation, decision was made to proceed with TEE and cardioversion which was initially scheduled for the afternoon of September 28, however, the patient converted spontaneously to sinus rhythm.  This being the case, TEE and cardioversion were cancelled and she was placed on Tikosyn at 250 mcg q.12 h.  Baseline QTC was measured at 474 and this remained stable throughout Tikosyn loading. The patient did not require any adjustment of her Tikosyn dose and has had no recurrence of atrial fibrillation.  It should be noted that the patient has exhibited fairly significant hypokalemia throughout this admission with an admission potassium of 3.4.  She has required ongoing supplementation and at one point during this hospitalization was receiving 40 mEq t.i.d.  Today, however, potassium is 4.6 and as a result we will reduce her dose at discharge to 40 mEq daily.  She was felt to be ready for discharge today.  DISCHARGE LABS:  Hemoglobin  12.4, hematocrit 37.0, WBC 4.6, platelets 104,000.  INR 2.69.  Sodium 142, potassium 4.6, chloride 105, CO2 32, BUN 9, creatinine 0.94, glucose 103, calcium 8.8, magnesium 2.1, CK 92, MB 4.1, troponin I 0.31.  BNP 901.1, total cholesterol 132, triglycerides 56, HDL 51, LDL 71.  TSH 1.753.  MRSA screen was negative.  DISPOSITION:  The patient will be discharged home today in good condition.  FOLLOWUP PLAN/APPOINTMENTS:  We have arranged for followup with [facility] on October 5 at 3:45 p.m.   It should be noted that her dose was adjusted from 6 mg 4 times a week to 4 mg daily during this admission secondary to admission INR 1.88.  She will follow up with Norma Fredrickson, nurse practitioner on October 18 at 8:45 a.m.  She will follow up with Dr. Merri Brunette as previously scheduled.  DISCHARGE MEDICATIONS: 1. Tikosyn 250 mcg q.12 h. 2. Potassium chloride 40 mEq daily. 3. Coumadin 4 mg at bedtime. 4. Fludrocortisone 0.1 mg 2 tablets b.i.d. 5. Simvastatin 10 mg at bedtime.  OUTSTANDING LABS AND STUDIES:  We have arranged for followup EKG and basic metabolic profile when the patient is seen in the office on October 18.  DURATION OF DISCHARGE ENCOUNTER:  60 minutes including physician time.     Nicolasa Ducking, ANP   ______________________________ [name], M.D.    CB/MEDQ  D:  12/06/2010  T:  12/06/2010  Job:  [id number]  cc:   Dario Guardian, M.D.  Electronically Signed by Nicolasa Ducking ANP on 12/14/2010 07:12:35 PM Electronically Signed by PETER Swaziland M.D. on 12/26/2010 02:55:34 PM

## 2010-12-26 NOTE — Telephone Encounter (Signed)
Message copied by Lorayne Bender on Mon Dec 26, 2010  5:03 PM ------      Message from: Swaziland, PETER M      Created: Thu Dec 22, 2010  1:54 PM       Potassium is low       On Tikosyn needs to be >4.0      Increase K+ to 40 meq bid.

## 2010-12-26 NOTE — Telephone Encounter (Signed)
Notified of lab results. Advised to increase KCl to 40 meq BID. Will call when needs new Rx. Also needs refill on Florinef. Takes 0.1 mg- 2 tablets twice a day.

## 2010-12-29 NOTE — H&P (Signed)
NAME:  Katrina Rodriguez, Katrina Rodriguez NO.:  [id number]  MEDICAL RECORD NO.:  [id number]  LOCATION:  [facility]                         FACILITY:  [facility]  PHYSICIAN:  Therisa Doyne, MD    DATE OF BIRTH:  1929-02-20  DATE OF ADMISSION:  12/02/2010 DATE OF DISCHARGE:                             HISTORY & PHYSICAL   PRIMARY CARDIOLOGIST:  [name], MD  PRIMARY ELECTROPHYSIOLOGIST:  Hillis Range, MD  PRIMARY CARE PROVIDER:  Dario Guardian, MD  CHIEF COMPLAINT:  Chest pain, atrial fibrillation with rapid ventricular response.  HISTORY OF PRESENT ILLNESS:  An 77 year old white female with a past medical history significant for tachybrady syndrome status post pacemaker implantation as well as paroxysmal atrial fibrillation status post radiofrequency ablation, who presents for evaluation of chest pain in the setting of atrial fibrillation with rapid ventricular response. Historically, she has been diagnosed with atrial fibrillation since 1990s.  She was maintained on antiarrhythmic drugs off and on.  She has had difficulty in the past with amiodarone because of thyroid and hair issues.  More recently in October 2011, she underwent radiofrequency ablation by Dr. Johney Frame.  After that time, she was maintained on Tikosyn and Coumadin.  She did well and then was ultimately discontinued on Tikosyn back in January 2012.  Over the past 1 week, she reports increasing episodes of paroxysmal atrial fibrillation, these have happened approximately 2-3 times per day for the past 1 week.  She has had 5 episodes of syncope.  Unfortunately, she did not seek medical care.  This evening around 9 p.m., she went back in to atrial fibrillation and had a severe episode of chest pain, rated as a 10/10 on pain scale.  This was nonradiating, was not associated with any shortness of breath.  She came to the emergency department and was noted be in atrial fibrillation with rapid ventricular response with  rate up to 150s-170s.  She was given IV diltiazem and unfortunately, her blood pressure dropped with the systolics of 70s.  She became fairly symptomatic.  Cardiology was consulted for further evaluation and management.  Currently, her hemodynamics have improved.  Her systolic blood pressure is 90/50 and her heart rate is in the 120s.  She is complaining of only 3/10 chest pain.  PAST MEDICAL HISTORY: 1. Paroxysmal atrial fibrillation, on chronic anticoagulation with     Coumadin.  See history present illness for details.  She is status     post radiofrequency ablation in October 2011.  She has been     discontinued on Tikosyn back in January 2012.  She is maintained on     Coumadin. 2. Tachybrady syndrome status post pacemaker implantation.  She has a     [employer] device that was put in 2004. 3. Mild nonobstructive coronary artery disease by cardiac     catheterization in 2005, demonstrating 10% LAD lesion. 4. History of right CVA in 2011. 5. History of thrombocytopenia. 6. Normal ejection fraction by echocardiogram in January 2012, with an     EF of 55%.  SOCIAL HISTORY:  The patient lives at home by herself in [place].  She denies tobacco, alcohol, or  drug use.  She is able to take care of her activities of daily living.  FAMILY HISTORY:  Notable for a brother with atrial fibrillation.  ALLERGIES: 1. AMIODARONE which caused thyroid disturbances as well as hair     changes. 2. CODEINE. 3. PENICILLIN. 4. DEMEROL. 5. DARVOCET.  MEDICATIONS: 1. Florinef 0.1 mg, take 2 tablets twice a day. 2. Simvastatin 10 mg daily. 3. Coumadin 6 mg on Tuesdays, Thursdays, Saturdays, and Sundays.  REVIEW OF SYSTEMS:  All systems were reviewed and were negative except for what is mentioned above in history of present illness.  PHYSICAL EXAMINATION:  VITAL SIGNS:  Temperature afebrile, blood pressure 90/50, pulse is 130, respirations 18, oxygen saturation 100% on room air. GENERAL:   Mild distress. HEENT:  Normocephalic, atraumatic.  Pupils equal, round, and reactive to light and accommodation.  Oropharynx pink and moist without lesions. NECK:  Supple.  No lymphadenopathy.  No Jugular venous distention.  No masses. CARDIOVASCULAR:  Tachycardic, irregularly irregular.  No murmurs. LUNGS:  Clear to auscultation bilaterally. ABDOMEN:  Positive bowel sounds.  Soft, nontender, and nondistended. EXTREMITIES:  Trace lower extremity edema.  2+ peripheral pulses.  Warm extremities. SKIN:  No rashes. BACK:  No CVA tenderness. NEUROLOGIC:  No focal deficits. PSYCHIATRIC:  Normal affect.  PERTINENT DATA:  Hemoglobin 12.9.  CBC does not show a platelet count at this time.  Potassium is low at 3.4, BUN 15, creatinine 0.9.  Troponin 0.00, CK-MB 2.4, INR 1.88.  EKG from 12:30 a.m. demonstrates atrial fibrillation with rapid ventricular response at 150 beats per minute with diffuse anterolateral ST depression.  IMPRESSION AND PLAN:  Katrina Rodriguez is an 77 year old white female with a past medical history significant for paroxysmal atrial fibrillation status post radiofrequency ablation as well as tachybrady syndrome status post pacemaker implantation, who presents with recurrent atrial fibrillation with rapid ventricular response and subsequent chest pain.  1. We will admit the patient to [facility] under Dr. Jenel Lucks     care. 2. Atrial fibrillation with rapid ventricular response.  It is unclear     what the precipitant for atrial fibrillation was.  It should be     noted that she is markedly hypokalemic with a potassium of 3.4     which may be related to her Florinef.  Certainly this could account     for her atrial fibrillation.  I think that she would benefit from a     rhythm control strategy.  We will tentatively plan for TEE with     cardioversion in the morning.  In the interim time, we will try to     rate control as best we can.  I think an ideal rate control      strategy for her would be heart rate of less than 110 beats per     minute.  Unfortunately, her blood pressure is somewhat marginal, so     we cannot use beta-blockers.  I will increase her diltiazem from 5     mg an hour to 10 mg an hour to start to improve her rate control.     We will also load with digoxin as this may help with rate control     without dropping her blood pressure too much.  After she gets     cardioverted, I think she would benefit from a long-term rhythm     control strategy.  She had tolerated Tikosyn well in the past and     it may  be reasonable to reinitiate therapy with Tikosyn.  We could     also consider a repeat ablation, I will leave this up to the     discretion of Dr. Johney Frame.  We should also get her pacemaker     interrogated to see what her burden of atrial fibrillation is.  She     will need to have her Coumadin increased, and I will increase her     to 6 mg daily.  We will start on Lovenox as her INR is     subtherapeutic.  We will replete her potassium as she is     hypokalemic and we will also check a magnesium level. 3. Chronic hypotension.  She will continue on Florinef.  This may     because of her hypokalemia and we will replace potassium. 4. Hyperlipidemia.  Continue statin therapy.  Check fasting profile. 5. Fluids, electrolytes, and nutrition.  Normal saline at 75 mL per     hour.  Hypokalemia will be treated with potassium.  N.p.o. 6. Deep vein thrombosis prophylaxis, not indicated as the patient will     be systemically anticoagulated with Lovenox.     Therisa Doyne, MD     SJT/MEDQ  D:  12/02/2010  T:  12/02/2010  Job:  [id number]  Electronically Signed by Aldona Bar MD on 12/29/2010 06:11:01 AM

## 2011-01-02 ENCOUNTER — Other Ambulatory Visit: Payer: Self-pay | Admitting: Cardiology

## 2011-01-02 ENCOUNTER — Telehealth: Payer: Self-pay | Admitting: Cardiology

## 2011-01-02 MED ORDER — FLUDROCORTISONE ACETATE 0.1 MG PO TABS: ORAL_TABLET | ORAL | Status: DC

## 2011-01-02 MED ORDER — DILTIAZEM HCL ER COATED BEADS 120 MG PO CP24: 120.0000 mg | ORAL_CAPSULE | Freq: Every day | ORAL | Status: DC

## 2011-01-02 MED ORDER — POTASSIUM CHLORIDE CRYS ER 20 MEQ PO TBCR: 20.0000 meq | EXTENDED_RELEASE_TABLET | Freq: Every day | ORAL | Status: DC

## 2011-01-02 MED ORDER — WARFARIN SODIUM 4 MG PO TABS: 4.0000 mg | ORAL_TABLET | ORAL | Status: DC

## 2011-01-04 NOTE — Telephone Encounter (Signed)
[facility] wants to verify the directions  For potassium klor con  Please call

## 2011-01-04 NOTE — Telephone Encounter (Signed)
Spoke w/Greg at [facility] to clarify dose of KCL. Advised is 40 meg BID. So will give 2 tablets of 20 meq BID

## 2011-01-05 ENCOUNTER — Ambulatory Visit (INDEPENDENT_AMBULATORY_CARE_PROVIDER_SITE_OTHER): Payer: Medicare Other | Admitting: *Deleted

## 2011-01-05 DIAGNOSIS — I4891 Unspecified atrial fibrillation: Secondary | ICD-10-CM

## 2011-01-05 DIAGNOSIS — Z7901 Long term (current) use of anticoagulants: Secondary | ICD-10-CM

## 2011-01-05 LAB — POCT INR: INR: 2.9

## 2011-01-16 ENCOUNTER — Ambulatory Visit (INDEPENDENT_AMBULATORY_CARE_PROVIDER_SITE_OTHER): Payer: Medicare Other | Admitting: *Deleted

## 2011-01-16 ENCOUNTER — Encounter: Payer: Self-pay | Admitting: Cardiology

## 2011-01-16 ENCOUNTER — Ambulatory Visit (INDEPENDENT_AMBULATORY_CARE_PROVIDER_SITE_OTHER): Payer: Medicare Other | Admitting: Cardiology

## 2011-01-16 DIAGNOSIS — I4891 Unspecified atrial fibrillation: Secondary | ICD-10-CM

## 2011-01-16 DIAGNOSIS — Z7901 Long term (current) use of anticoagulants: Secondary | ICD-10-CM

## 2011-01-16 DIAGNOSIS — R079 Chest pain, unspecified: Secondary | ICD-10-CM

## 2011-01-16 LAB — POCT INR: INR: 3.1

## 2011-01-16 MED ORDER — ATENOLOL 50 MG PO TABS: 50.0000 mg | ORAL_TABLET | Freq: Every day | ORAL | Status: DC

## 2011-01-16 NOTE — Assessment & Plan Note (Addendum)
I am concerned about the interaction of diltiazem with Tikosyn. I recommended stopping diltiazem and we will place her on atenolol 50 mg daily. At one point she was taking this twice a day but it was stopped because of hypotension. She is scheduled for repeat pacemaker evaluation next month. She has had a previous atrial fibrillation ablation. She had no recurrent atrial fibrillation for over 2 years. Her Tikosyn was stopped early in the year and she had a recurrence about 7 months later.

## 2011-01-16 NOTE — Assessment & Plan Note (Signed)
She has a long history of chest pain with negative ischemic evaluation in the past including a normal cardiac catheterization in 2005. I suspect that her chest pain is predominately due to to breakthrough atrial fibrillation.

## 2011-01-16 NOTE — Patient Instructions (Signed)
Stop taking diltiazem CD.  Start back on atenolol 50 mg daily.  Continue your other medications.  We will check your coumadin when you see Dr. Johney Frame.  I will see you again in 3 months.

## 2011-01-16 NOTE — Progress Notes (Signed)
    Katrina Rodriguez Date of Birth: 21-Sep-1928 Medical Record #[id number]  History of Present Illness: Katrina Rodriguez is seen today for a followup visit. She complains that with exertion especially walking up an incline she experienced sensation of her heart pounding and then develops chest pain. This is described as an aching or pressing feeling in her chest. It is relieved with rest. She doesn't feel that this is her atrial fibrillation. Prior evaluation did show that she was having breakthrough atrial fibrillation with rapid ventricular response. She was continued on Tikosyn and diltiazem was added. She does appear to be tolerating this okay.  Current Outpatient Prescriptions on File Prior to Visit  Medication Sig Dispense Refill  . dofetilide (TIKOSYN) 250 MCG capsule Take 250 mcg by mouth 2 (two) times daily.        . fludrocortisone (FLORINEF) 0.1 MG tablet Take 2 tablets (0.2 mg) twice a day  360 tablet  3  . potassium chloride SA (K-DUR,KLOR-CON) 20 MEQ tablet Take 1 tablet (20 mEq total) by mouth daily. Take 2 tablets daily  180 tablet  3  . simvastatin (ZOCOR) 10 MG tablet Take 1 tablet (10 mg total) by mouth at bedtime.  90 tablet  3  . warfarin (COUMADIN) 4 MG tablet Take 1 tablet (4 mg total) by mouth as directed.  90 tablet  1    Allergies  Allergen Reactions  . Amoxicillin   . Codeine   . Darvocet (Propoxyphene N-Acetaminophen)   . Demerol   . Penicillins     Past Medical History  Diagnosis Date  . Atrial fibrillation   . Thrombocytopenia   . H/O: CVA (cardiovascular accident)     right internal capsule  . Orthostasis   . Anticoagulant long-term use   . Pacemaker   . Tachycardia-bradycardia syndrome   . Mild pulmonary hypertension   . Chronic anemia   . Syncope, cardiogenic   . Chest pain     Has normal coronaries per cath in 2005; negative nuclear in 2010  . Palpitation   . Hyperthyroidism   . Retinal detachment   . High risk medication use     Tikosyn & Coumadin     Past Surgical History  Procedure Date  . Cardiac pacemaker placement   . Cardiac catheterization   . Retinal detachment surgery     History  Smoking status  . Never Smoker   Smokeless tobacco  . Never Used    History  Alcohol Use No    Family History  Problem Relation Age of Onset  . Heart disease Father   . Heart disease Brother   . Heart disease Brother     Review of Systems: The review of systems is positive for chest tightness and palpitations. She is a little unsteady. No falls. No bleeding or bruising.  All other systems were reviewed and are negative.  Physical Exam: BP 133/78  Pulse 81  Ht 5\' 5"  (1.651 m)  Wt 51.166 kg (112 lb 12.8 oz)  BMI 18.77 kg/m2 Patient is very pleasant and in no acute distress. Skin is warm and dry. Color is normal.  HEENT is unremarkable. Normocephalic/atraumatic. PERRL. Sclera are nonicteric. Neck is supple. No masses. No JVD. Lungs are clear. Cardiac exam shows a regular rate and rhythm. Abdomen is soft. Extremities are without edema. Gait and ROM are intact but she is a little unsteady. No gross neurologic deficits noted.   LABORATORY DATA:   Assessment / Plan:

## 2011-01-25 ENCOUNTER — Other Ambulatory Visit: Payer: Self-pay | Admitting: Cardiology

## 2011-01-27 MED ORDER — DOFETILIDE 250 MCG PO CAPS: 250.0000 ug | ORAL_CAPSULE | Freq: Two times a day (BID) | ORAL | Status: DC

## 2011-02-13 ENCOUNTER — Encounter: Payer: Self-pay | Admitting: Internal Medicine

## 2011-02-13 ENCOUNTER — Encounter: Payer: Medicare Other | Admitting: Internal Medicine

## 2011-02-13 ENCOUNTER — Ambulatory Visit (INDEPENDENT_AMBULATORY_CARE_PROVIDER_SITE_OTHER): Payer: Medicare Other | Admitting: Internal Medicine

## 2011-02-13 ENCOUNTER — Ambulatory Visit (INDEPENDENT_AMBULATORY_CARE_PROVIDER_SITE_OTHER): Payer: Medicare Other | Admitting: *Deleted

## 2011-02-13 DIAGNOSIS — I4891 Unspecified atrial fibrillation: Secondary | ICD-10-CM

## 2011-02-13 DIAGNOSIS — Z950 Presence of cardiac pacemaker: Secondary | ICD-10-CM

## 2011-02-13 DIAGNOSIS — Z7901 Long term (current) use of anticoagulants: Secondary | ICD-10-CM

## 2011-02-13 DIAGNOSIS — I495 Sick sinus syndrome: Secondary | ICD-10-CM

## 2011-02-13 LAB — PACEMAKER DEVICE OBSERVATION
AL AMPLITUDE: 2.8 mv
AL IMPEDENCE PM: 644 Ohm
AL THRESHOLD: 0.5 V
BATTERY VOLTAGE: 2.72 V
BMOD-0005RA: 80 {beats}/min
RV LEAD AMPLITUDE: 15.68 mv

## 2011-02-13 NOTE — Assessment & Plan Note (Signed)
Maintaining sinus rhythm Continue coumadin and tikosyn ekg 12/22/10 reveals sinus with Qtc 461  No changes today

## 2011-02-13 NOTE — Progress Notes (Signed)
PCP:  Allean Found, MD  The patient presents today for routine electrophysiology followup.  Since last being seen in our clinic, the patient reports doing very well.  Her primary concern is with declining vision.  She has occasional chest pain with moderate activity.  This has improved with medical therapy by Dr Swaziland.  Today, she denies symptoms of palpitations, shortness of breath, orthopnea, PND, lower extremity edema, dizziness, presyncope, syncope, or neurologic sequela.  The patient feels that she is tolerating medications without difficulties and is otherwise without complaint today.   Past Medical History  Diagnosis Date  . Atrial fibrillation   . Thrombocytopenia   . H/O: CVA (cardiovascular accident)     right internal capsule  . Orthostasis   . Anticoagulant long-term use   . Pacemaker   . Tachycardia-bradycardia syndrome   . Mild pulmonary hypertension   . Chronic anemia   . Syncope, cardiogenic   . Chest pain     Has normal coronaries per cath in 2005; negative nuclear in 2010  . Palpitation   . Hyperthyroidism   . Retinal detachment   . High risk medication use     Tikosyn & Coumadin   Past Surgical History  Procedure Date  . Cardiac pacemaker placement   . Cardiac catheterization   . Retinal detachment surgery   . Afib ablation     by Dr Johney Frame    Current Outpatient Prescriptions  Medication Sig Dispense Refill  . atenolol (TENORMIN) 50 MG tablet Take 1 tablet (50 mg total) by mouth daily.  90 tablet  3  . dofetilide (TIKOSYN) 250 MCG capsule Take 1 capsule (250 mcg total) by mouth 2 (two) times daily.  60 capsule  5  . fludrocortisone (FLORINEF) 0.1 MG tablet Take 2 tablets (0.2 mg) twice a day  360 tablet  3  . potassium chloride SA (K-DUR,KLOR-CON) 20 MEQ tablet Take 1 tablet (20 mEq total) by mouth daily. Take 2 tablets daily  180 tablet  3  . simvastatin (ZOCOR) 10 MG tablet Take 1 tablet (10 mg total) by mouth at bedtime.  90 tablet  3  .  warfarin (COUMADIN) 4 MG tablet Take 1 tablet (4 mg total) by mouth as directed.  90 tablet  1    Allergies  Allergen Reactions  . Amoxicillin   . Codeine   . Darvocet (Propoxyphene N-Acetaminophen)   . Demerol   . Penicillins     History   Social History  . Marital Status: Widowed    Spouse Name: N/A    Number of Children: 2  . Years of Education: N/A   Occupational History  . homemaker    Social History Main Topics  . Smoking status: Never Smoker   . Smokeless tobacco: Never Used  . Alcohol Use: No  . Drug Use: No  . Sexually Active: No   Other Topics Concern  . Not on file   Social History Narrative  . No narrative on file    Family History  Problem Relation Age of Onset  . Heart disease Father   . Heart disease Brother   . Heart disease Brother     ROS-  All systems are reviewed and are negative except as outlined in the HPI above   Physical Exam: Filed Vitals:   02/13/11 1143  BP: 132/78  Pulse: 60  Height: 5\' 5"  (1.651 m)  Weight: 112 lb (50.803 kg)    GEN- The patient is well appearing, alert and oriented x  3 today.   Head- normocephalic, atraumatic Eyes-  Sclera clear, conjunctiva pink Ears- hearing intact Oropharynx- clear Neck- supple, no JVP Lymph- no cervical lymphadenopathy Lungs- Clear to ausculation bilaterally, normal work of breathing Chest- pacemaker pocket is well healed Heart- Regular rate and rhythm, no murmurs, rubs or gallops, PMI not laterally displaced GI- soft, NT, ND, + BS Extremities- no clubbing, cyanosis, or edema  Pacemaker interrogation- reviewed in detail today,  See PACEART report  Assessment and Plan:

## 2011-02-13 NOTE — Assessment & Plan Note (Signed)
Normal pacemaker function See Pace Art report No changes today  

## 2011-02-13 NOTE — Patient Instructions (Signed)
Your physician wants you to follow-up in:  12 months.  You will receive a reminder letter in the mail two months in advance. If you don't receive a letter, please call our office to schedule the follow-up appointment.   

## 2011-02-27 ENCOUNTER — Encounter: Payer: Self-pay | Admitting: Cardiology

## 2011-03-13 ENCOUNTER — Ambulatory Visit (INDEPENDENT_AMBULATORY_CARE_PROVIDER_SITE_OTHER): Payer: Medicare Other | Admitting: *Deleted

## 2011-03-13 DIAGNOSIS — I4891 Unspecified atrial fibrillation: Secondary | ICD-10-CM | POA: Diagnosis not present

## 2011-03-13 DIAGNOSIS — Z7901 Long term (current) use of anticoagulants: Secondary | ICD-10-CM | POA: Diagnosis not present

## 2011-03-13 LAB — POCT INR: INR: 2.6

## 2011-04-10 DIAGNOSIS — H3530 Unspecified macular degeneration: Secondary | ICD-10-CM | POA: Diagnosis not present

## 2011-04-10 DIAGNOSIS — H35379 Puckering of macula, unspecified eye: Secondary | ICD-10-CM | POA: Diagnosis not present

## 2011-04-18 ENCOUNTER — Ambulatory Visit (INDEPENDENT_AMBULATORY_CARE_PROVIDER_SITE_OTHER): Payer: Medicare Other | Admitting: *Deleted

## 2011-04-18 ENCOUNTER — Encounter: Payer: Self-pay | Admitting: Cardiology

## 2011-04-18 ENCOUNTER — Ambulatory Visit (INDEPENDENT_AMBULATORY_CARE_PROVIDER_SITE_OTHER): Payer: Medicare Other | Admitting: Cardiology

## 2011-04-18 DIAGNOSIS — R55 Syncope and collapse: Secondary | ICD-10-CM | POA: Diagnosis not present

## 2011-04-18 DIAGNOSIS — I4891 Unspecified atrial fibrillation: Secondary | ICD-10-CM

## 2011-04-18 DIAGNOSIS — Z7901 Long term (current) use of anticoagulants: Secondary | ICD-10-CM

## 2011-04-18 DIAGNOSIS — R079 Chest pain, unspecified: Secondary | ICD-10-CM | POA: Diagnosis not present

## 2011-04-18 LAB — POCT INR: INR: 2.8

## 2011-04-18 NOTE — Assessment & Plan Note (Signed)
She's had recurrent chest pain. Based on her prior cardiac evaluation I doubt this is anginal. In the past this has been associated with atrial fibrillation but more recently it occurs in the absence of atrial fibrillation. We'll update a nuclear stress test today. We'll manage conservatively.

## 2011-04-18 NOTE — Assessment & Plan Note (Signed)
This appears to be well-controlled on Tikosyn. She is therapeutic on her current anticoagulation.

## 2011-04-18 NOTE — Patient Instructions (Signed)
We will schedule you for a nuclear stress test.  Continue your current medication.  I will see you again in 3 months.

## 2011-04-18 NOTE — Assessment & Plan Note (Signed)
She has a long history of recurrent syncope that is multifactorial. There is a component of arrhythmia, orthostatic hypotension, and unsteady gait. We will continue with her current dose of Florinef.

## 2011-04-18 NOTE — Progress Notes (Signed)
Katrina Rodriguez Date of Birth: October 22, 1928 Medical Record #[id number]  History of Present Illness: Katrina Rodriguez is seen today for a followup visit. This past week she had an episode of significant chest pain. She was down scrubbing her floors she developed chest pain that radiated up into her jaw. She developed some left arm numbness and weakness. She states left arm weakness lasted a couple of days. She has had no true syncopal episodes. She is now losing vision in her right eye and is seeing a retinal specialist at [facility]. She has no known history of coronary disease. She had a cardiac catheterization in 2005 which was normal. Her last nuclear stress test in February 2010 was also normal.  Current Outpatient Prescriptions on File Prior to Visit  Medication Sig Dispense Refill  . atenolol (TENORMIN) 50 MG tablet Take 1 tablet (50 mg total) by mouth daily.  90 tablet  3  . dofetilide (TIKOSYN) 250 MCG capsule Take 1 capsule (250 mcg total) by mouth 2 (two) times daily.  60 capsule  5  . fludrocortisone (FLORINEF) 0.1 MG tablet Take 2 tablets (0.2 mg) twice a day  360 tablet  3  . potassium chloride SA (K-DUR,KLOR-CON) 20 MEQ tablet Take 1 tablet (20 mEq total) by mouth daily. Take 2 tablets daily  180 tablet  3  . simvastatin (ZOCOR) 10 MG tablet Take 1 tablet (10 mg total) by mouth at bedtime.  90 tablet  3  . warfarin (COUMADIN) 4 MG tablet Take 1 tablet (4 mg total) by mouth as directed.  90 tablet  1    Allergies  Allergen Reactions  . Amoxicillin   . Codeine   . Darvocet (Propoxyphene N-Acetaminophen)   . Demerol   . Penicillins     Past Medical History  Diagnosis Date  . Atrial fibrillation   . Thrombocytopenia   . H/O: CVA (cardiovascular accident)     right internal capsule  . Orthostasis   . Anticoagulant long-term use   . Pacemaker   . Tachycardia-bradycardia syndrome   . Mild pulmonary hypertension   . Chronic anemia   . Syncope, cardiogenic   . Chest pain     Has  normal coronaries per cath in 2005; negative nuclear in 2010  . Palpitation   . Hyperthyroidism   . Retinal detachment   . High risk medication use     Tikosyn & Coumadin    Past Surgical History  Procedure Date  . Cardiac pacemaker placement   . Cardiac catheterization   . Retinal detachment surgery   . Afib ablation     by Dr Johney Frame    History  Smoking status  . Never Smoker   Smokeless tobacco  . Never Used    History  Alcohol Use No    Family History  Problem Relation Age of Onset  . Heart disease Father   . Heart disease Brother   . Heart disease Brother     Review of Systems: The review of systems is positive for chest tightness. She is a little unsteady. No falls. No bleeding or bruising.  All other systems were reviewed and are negative.  Physical Exam: BP 130/68  Pulse 48  Ht 5\' 5"  (1.651 m)  Wt 111 lb 1.9 oz (50.404 kg)  BMI 18.49 kg/m2 Patient is very pleasant and in no acute distress. Skin is warm and dry. Color is normal.  HEENT is unremarkable. Normocephalic/atraumatic. PERRL. Sclera are nonicteric. Neck is supple. No masses.  No JVD. Lungs are clear. Cardiac exam shows a regular rate and rhythm. Abdomen is soft. Extremities are without edema. Gait and ROM are intact but she is a little unsteady. No gross neurologic deficits noted.   LABORATORY DATA:  INR today is 2.8. Assessment / Plan:

## 2011-05-02 ENCOUNTER — Ambulatory Visit (HOSPITAL_COMMUNITY): Payer: Medicare Other | Attending: Cardiovascular Disease | Admitting: Radiology

## 2011-05-02 DIAGNOSIS — Z8249 Family history of ischemic heart disease and other diseases of the circulatory system: Secondary | ICD-10-CM | POA: Diagnosis not present

## 2011-05-02 DIAGNOSIS — R0602 Shortness of breath: Secondary | ICD-10-CM | POA: Diagnosis not present

## 2011-05-02 DIAGNOSIS — R0989 Other specified symptoms and signs involving the circulatory and respiratory systems: Secondary | ICD-10-CM | POA: Insufficient documentation

## 2011-05-02 DIAGNOSIS — R002 Palpitations: Secondary | ICD-10-CM | POA: Insufficient documentation

## 2011-05-02 DIAGNOSIS — R55 Syncope and collapse: Secondary | ICD-10-CM | POA: Diagnosis not present

## 2011-05-02 DIAGNOSIS — R079 Chest pain, unspecified: Secondary | ICD-10-CM

## 2011-05-02 DIAGNOSIS — R5381 Other malaise: Secondary | ICD-10-CM | POA: Diagnosis not present

## 2011-05-02 DIAGNOSIS — I4891 Unspecified atrial fibrillation: Secondary | ICD-10-CM | POA: Diagnosis not present

## 2011-05-02 DIAGNOSIS — R42 Dizziness and giddiness: Secondary | ICD-10-CM | POA: Diagnosis not present

## 2011-05-02 DIAGNOSIS — R6884 Jaw pain: Secondary | ICD-10-CM | POA: Insufficient documentation

## 2011-05-02 DIAGNOSIS — R110 Nausea: Secondary | ICD-10-CM | POA: Diagnosis not present

## 2011-05-02 DIAGNOSIS — Z8673 Personal history of transient ischemic attack (TIA), and cerebral infarction without residual deficits: Secondary | ICD-10-CM | POA: Insufficient documentation

## 2011-05-02 DIAGNOSIS — R0609 Other forms of dyspnea: Secondary | ICD-10-CM | POA: Diagnosis not present

## 2011-05-02 MED ORDER — TECHNETIUM TC 99M TETROFOSMIN IV KIT
33.0000 | PACK | Freq: Once | INTRAVENOUS | Status: AC | PRN
  Administered 2011-05-02: 33 via INTRAVENOUS

## 2011-05-02 MED ORDER — TECHNETIUM TC 99M TETROFOSMIN IV KIT
11.0000 | PACK | Freq: Once | INTRAVENOUS | Status: AC | PRN
  Administered 2011-05-02: 11 via INTRAVENOUS

## 2011-05-02 MED ORDER — REGADENOSON 0.4 MG/5ML IV SOLN
0.4000 mg | Freq: Once | INTRAVENOUS | Status: AC
  Administered 2011-05-02: 0.4 mg via INTRAVENOUS

## 2011-05-02 NOTE — Progress Notes (Signed)
[facility] SITE 3 NUCLEAR MED [address] [place] [address] [phone]  Cardiology Nuclear Med Study  Katrina Rodriguez is a 78 y.o. female [id number] 01-Aug-1928   Nuclear Med Background Indication for Stress Test:  Evaluation for Ischemia History:  '04 PTVP; '05 Cath:Normal; '10 GMW:NUUVOZ, EF=75%; '10 Ablation; '11 Echo:EF=60-65%; Atrial Fibrillation Cardiac Risk Factors: CVA/TIA, Family History - CAD and Lipids   Symptoms:  Chest Pain>Jaw with and without Exertion (last episode of chest discomfort was 3-days ago), Dizziness, DOE, Fatigue, Light-Headedness, Nausea, Palpitations and Syncope   Nuclear Pre-Procedure Caffeine/Decaff Intake:  None>12 hours NPO After: 8:00pm   Lungs:  Clear.  O2 SAT 97% on RA. IV 0.9% NS with Angio Cath:  22g  IV Site: R Forearm x 1, tolerated well IV Started by:  Irean Hong, RN  Chest Size (in):  32 Cup Size: A  Height: 5\' 5"  (1.651 m)  Weight:  113 lb (51.256 kg)  BMI:  Body mass index is 18.80 kg/(m^2). Tech Comments:  Atenolol held x 24 hours    Nuclear Med Study 1 or 2 day study: 1 day  Stress Test Type:  Lexiscan  Reading MD: Charlton Haws, MD  Order Authorizing Provider:  Caine Barfield Swazil[place], MD  Resting Radionuclide: Technetium 20m Tetrofosmin  Resting Radionuclide Dose: 11.0 mCi   Stress Radionuclide:  Technetium 20m Tetrofosmin  Stress Radionuclide Dose: 33.0 mCi           Stress Protocol Rest HR: 52 Stress HR: 83  Rest BP: 126/71 Stress BP: 124/68  Exercise Time (min): n/a METS: n/a   Predicted Max HR: 140 bpm % Max HR: 59.29 bpm Rate Pressure Product: 36644   Dose of Adenosine (mg):  n/a Dose of Lexiscan: 0.4 mg  Dose of Atropine (mg): n/a Dose of Dobutamine: n/a mcg/kg/min (at max HR)  Stress Test Technologist: Smiley Houseman, CMA-N  Nuclear Technologist:  Domenic Polite, CNMT     Rest Procedure:  Myocardial perfusion imaging was performed at rest 45 minutes following the intravenous administration of  Technetium 62m Tetrofosmin.  Rest ECG: Atrial paced.  Stress Procedure:  The patient received IV Lexiscan 0.4 mg over 15-seconds.  Technetium 61m Tetrofosmin injected at 30-seconds.  There were minor nonspecific T-wave changes and a rare PAC with Lexiscan.  Patient had chest tightness, 7/10 with Lexiscan.  Quantitative spect images were obtained after a 45 minute delay.  Patient was still having chest tightness, 4/10, after stress images and they were reviewed with Dr. Swaziland and he felt it was not cardiac in origin and safe for the patient to go home.  Stress ECG: No significant change from baseline ECG  QPS Raw Data Images:  Normal; no motion artifact; normal heart/lung ratio. Stress Images:  Normal homogeneous uptake in all areas of the myocardium. Rest Images:  Normal homogeneous uptake in all areas of the myocardium. Subtraction (SDS):  Normal Transient Ischemic Dilatation (Normal <1.22):  0.92 Lung/Heart Ratio (Normal <0.45):  0.33  Quantitative Gated Spect Images QGS EDV:  55 ml QGS ESV:  11 ml QGS cine images:  NL LV Function; NL Wall Motion QGS EF: 80%  Impression Exercise Capacity:  Lexiscan with no exercise. BP Response:  Normal blood pressure response. Clinical Symptoms:  Mild chest pain/dyspnea. ECG Impression:  No significant ST segment change suggestive of ischemia. Comparison with Prior Nuclear Study: No images to compare  Overall Impression:  Normal stress nuclear study. Baseline ECG with atrial pacing   Charlton Haws

## 2011-05-16 DIAGNOSIS — H3320 Serous retinal detachment, unspecified eye: Secondary | ICD-10-CM | POA: Diagnosis not present

## 2011-05-16 DIAGNOSIS — H3530 Unspecified macular degeneration: Secondary | ICD-10-CM | POA: Diagnosis not present

## 2011-05-16 DIAGNOSIS — H348192 Central retinal vein occlusion, unspecified eye, stable: Secondary | ICD-10-CM | POA: Diagnosis not present

## 2011-05-18 ENCOUNTER — Ambulatory Visit (INDEPENDENT_AMBULATORY_CARE_PROVIDER_SITE_OTHER): Payer: Medicare Other | Admitting: *Deleted

## 2011-05-18 ENCOUNTER — Encounter: Payer: Self-pay | Admitting: Internal Medicine

## 2011-05-18 DIAGNOSIS — I4891 Unspecified atrial fibrillation: Secondary | ICD-10-CM

## 2011-05-18 DIAGNOSIS — I495 Sick sinus syndrome: Secondary | ICD-10-CM

## 2011-05-18 DIAGNOSIS — Z950 Presence of cardiac pacemaker: Secondary | ICD-10-CM | POA: Diagnosis not present

## 2011-05-19 LAB — REMOTE PACEMAKER DEVICE
AL AMPLITUDE: 1.4 mv
AL IMPEDENCE PM: 637 Ohm
BATTERY VOLTAGE: 2.71 V
BRDY-0002RA: 50 {beats}/min

## 2011-05-23 ENCOUNTER — Encounter: Payer: Self-pay | Admitting: *Deleted

## 2011-05-30 ENCOUNTER — Ambulatory Visit (INDEPENDENT_AMBULATORY_CARE_PROVIDER_SITE_OTHER): Payer: Medicare Other | Admitting: *Deleted

## 2011-05-30 DIAGNOSIS — I4891 Unspecified atrial fibrillation: Secondary | ICD-10-CM

## 2011-05-30 DIAGNOSIS — Z7901 Long term (current) use of anticoagulants: Secondary | ICD-10-CM | POA: Diagnosis not present

## 2011-05-30 LAB — POCT INR: INR: 2.2

## 2011-06-01 NOTE — Progress Notes (Signed)
Remote pacer check  

## 2011-07-10 DIAGNOSIS — H0230 Blepharochalasis unspecified eye, unspecified eyelid: Secondary | ICD-10-CM | POA: Diagnosis not present

## 2011-07-10 DIAGNOSIS — H3530 Unspecified macular degeneration: Secondary | ICD-10-CM | POA: Diagnosis not present

## 2011-07-10 DIAGNOSIS — Z961 Presence of intraocular lens: Secondary | ICD-10-CM | POA: Diagnosis not present

## 2011-07-14 ENCOUNTER — Encounter (INDEPENDENT_AMBULATORY_CARE_PROVIDER_SITE_OTHER): Payer: Medicare Other | Admitting: Ophthalmology

## 2011-07-19 ENCOUNTER — Ambulatory Visit (INDEPENDENT_AMBULATORY_CARE_PROVIDER_SITE_OTHER): Payer: Medicare Other | Admitting: Cardiology

## 2011-07-19 ENCOUNTER — Ambulatory Visit (INDEPENDENT_AMBULATORY_CARE_PROVIDER_SITE_OTHER): Payer: Medicare Other | Admitting: *Deleted

## 2011-07-19 ENCOUNTER — Encounter: Payer: Self-pay | Admitting: Cardiology

## 2011-07-19 VITALS — BP 128/76 | HR 59 | Ht 65.0 in | Wt 113.0 lb

## 2011-07-19 DIAGNOSIS — R55 Syncope and collapse: Secondary | ICD-10-CM

## 2011-07-19 DIAGNOSIS — R079 Chest pain, unspecified: Secondary | ICD-10-CM

## 2011-07-19 DIAGNOSIS — Z7901 Long term (current) use of anticoagulants: Secondary | ICD-10-CM | POA: Diagnosis not present

## 2011-07-19 DIAGNOSIS — R0789 Other chest pain: Secondary | ICD-10-CM | POA: Diagnosis not present

## 2011-07-19 DIAGNOSIS — I951 Orthostatic hypotension: Secondary | ICD-10-CM | POA: Diagnosis not present

## 2011-07-19 DIAGNOSIS — I4891 Unspecified atrial fibrillation: Secondary | ICD-10-CM

## 2011-07-19 DIAGNOSIS — Z8673 Personal history of transient ischemic attack (TIA), and cerebral infarction without residual deficits: Secondary | ICD-10-CM

## 2011-07-19 NOTE — Assessment & Plan Note (Signed)
She is on long term Florinef therapy. She needs to continue to liberalize salt in her diet.

## 2011-07-19 NOTE — Assessment & Plan Note (Signed)
She is on chronic Coumadin therapy for her history of stroke and atrial fibrillation. We will check her INR today.

## 2011-07-19 NOTE — Assessment & Plan Note (Signed)
Previous Myoview study was normal. She has had a normal cardiac catheterization in the past. We will manage conservatively.

## 2011-07-19 NOTE — Patient Instructions (Signed)
Continue your current medication  I will see you again in 4 months.   

## 2011-07-19 NOTE — Progress Notes (Signed)
Katrina Rodriguez Date of Birth: 1928/05/08 Medical Record #[id number]  History of Present Illness: Katrina Rodriguez is seen today for a followup visit. She reports that her chest pain is doing better. She now thinks that she has chest pain just because she is out of shape. She continues to have periods of dizziness and near syncope associated with low blood pressure readings. She reports her blood pressure fluctuates a lot. She is mainly concerned about her ongoing visual problems. She is seeing a retinal specialist and is scheduled to be evaluated at [facility] later this summer. Her loss of vision has significantly impacted her independence. She denies any significant tachycardia or palpitations.  Current Outpatient Prescriptions on File Prior to Visit  Medication Sig Dispense Refill  . atenolol (TENORMIN) 50 MG tablet Take 1 tablet (50 mg total) by mouth daily.  90 tablet  3  . dofetilide (TIKOSYN) 250 MCG capsule Take 1 capsule (250 mcg total) by mouth 2 (two) times daily.  60 capsule  5  . fludrocortisone (FLORINEF) 0.1 MG tablet Take 2 tablets (0.2 mg) twice a day  360 tablet  3  . potassium chloride SA (K-DUR,KLOR-CON) 20 MEQ tablet Take 1 tablet (20 mEq total) by mouth daily. Take 2 tablets daily  180 tablet  3  . simvastatin (ZOCOR) 10 MG tablet Take 1 tablet (10 mg total) by mouth at bedtime.  90 tablet  3  . warfarin (COUMADIN) 4 MG tablet Take 1 tablet (4 mg total) by mouth as directed.  90 tablet  1    Allergies  Allergen Reactions  . Amoxicillin   . Codeine   . Darvocet (Propoxyphene-Acetaminophen)   . Demerol   . Penicillins     Past Medical History  Diagnosis Date  . Atrial fibrillation   . Thrombocytopenia   . H/O: CVA (cardiovascular accident)     right internal capsule  . Orthostasis   . Anticoagulant long-term use   . Pacemaker   . Tachycardia-bradycardia syndrome   . Mild pulmonary hypertension   . Chronic anemia   . Syncope, cardiogenic   . Chest pain     Has normal  coronaries per cath in 2005; negative nuclear in 2010  . Palpitation   . Hyperthyroidism   . Retinal detachment   . High risk medication use     Tikosyn & Coumadin    Past Surgical History  Procedure Date  . Cardiac pacemaker placement   . Cardiac catheterization   . Retinal detachment surgery   . Afib ablation     by Dr Johney Frame    History  Smoking status  . Never Smoker   Smokeless tobacco  . Never Used    History  Alcohol Use No    Family History  Problem Relation Age of Onset  . Heart disease Father   . Heart disease Brother   . Heart disease Brother     Review of Systems: The review of systems is positive for recurrent near-syncope. No falls. No bleeding or bruising.  All other systems were reviewed and are negative.  Physical Exam: BP 128/76  Pulse 59  Ht 5\' 5"  (1.651 m)  Wt 113 lb (51.256 kg)  BMI 18.80 kg/m2 Patient is very pleasant and in no acute distress. Skin is warm and dry. Color is normal.  HEENT is unremarkable. Normocephalic/atraumatic. PERRL. Sclera are nonicteric. Neck is supple. No masses. No JVD. Lungs are clear. Cardiac exam shows a regular rate and rhythm. Abdomen is soft.  Extremities are without edema. Gait and ROM are intact but she is a little unsteady. No gross neurologic deficits noted.   LABORATORY DATA:  INR today is pending. ECG today demonstrates normal sinus rhythm with a rate of 57 beats per minute. QTC is prolonged at 496 ms. Assessment / Plan:

## 2011-07-19 NOTE — Assessment & Plan Note (Signed)
She appears to be well controlled since we placed her back on Tikosyn. QTC is mildly prolonged. We will continue to monitor. Continue Coumadin therapy.

## 2011-07-26 ENCOUNTER — Encounter (INDEPENDENT_AMBULATORY_CARE_PROVIDER_SITE_OTHER): Payer: Medicare Other | Admitting: Ophthalmology

## 2011-07-26 DIAGNOSIS — D3130 Benign neoplasm of unspecified choroid: Secondary | ICD-10-CM

## 2011-07-26 DIAGNOSIS — H33009 Unspecified retinal detachment with retinal break, unspecified eye: Secondary | ICD-10-CM

## 2011-07-26 DIAGNOSIS — H3530 Unspecified macular degeneration: Secondary | ICD-10-CM

## 2011-07-26 DIAGNOSIS — H47219 Primary optic atrophy, unspecified eye: Secondary | ICD-10-CM

## 2011-07-26 DIAGNOSIS — H43819 Vitreous degeneration, unspecified eye: Secondary | ICD-10-CM

## 2011-08-01 ENCOUNTER — Telehealth: Payer: Self-pay | Admitting: Cardiology

## 2011-08-01 MED ORDER — DOFETILIDE 250 MCG PO CAPS: 250.0000 ug | ORAL_CAPSULE | Freq: Two times a day (BID) | ORAL | Status: DC

## 2011-08-01 MED ORDER — SIMVASTATIN 10 MG PO TABS: 10.0000 mg | ORAL_TABLET | Freq: Every day | ORAL | Status: DC

## 2011-08-01 NOTE — Telephone Encounter (Signed)
New Problem:    Patient called in needing a 90 day refill of her dofetilide (TIKOSYN) 250 MCG capsule and simvastatin (ZOCOR) 10 MG tablet.

## 2011-08-08 NOTE — Progress Notes (Signed)
Addended by: Reine Just on: 08/08/2011 07:19 PM   Modules accepted: Orders

## 2011-08-15 NOTE — Progress Notes (Signed)
Addended by: Reine Just on: 08/15/2011 05:20 PM   Modules accepted: Orders

## 2011-08-24 ENCOUNTER — Ambulatory Visit (INDEPENDENT_AMBULATORY_CARE_PROVIDER_SITE_OTHER): Payer: Medicare Other | Admitting: *Deleted

## 2011-08-24 ENCOUNTER — Encounter: Payer: Self-pay | Admitting: Internal Medicine

## 2011-08-24 DIAGNOSIS — Z950 Presence of cardiac pacemaker: Secondary | ICD-10-CM

## 2011-08-24 DIAGNOSIS — I4891 Unspecified atrial fibrillation: Secondary | ICD-10-CM | POA: Diagnosis not present

## 2011-09-04 ENCOUNTER — Ambulatory Visit (INDEPENDENT_AMBULATORY_CARE_PROVIDER_SITE_OTHER): Payer: Medicare Other | Admitting: Pharmacist

## 2011-09-04 DIAGNOSIS — Z7901 Long term (current) use of anticoagulants: Secondary | ICD-10-CM

## 2011-09-04 DIAGNOSIS — H4720 Unspecified optic atrophy: Secondary | ICD-10-CM | POA: Diagnosis not present

## 2011-09-04 DIAGNOSIS — I4891 Unspecified atrial fibrillation: Secondary | ICD-10-CM

## 2011-09-04 DIAGNOSIS — H3530 Unspecified macular degeneration: Secondary | ICD-10-CM | POA: Diagnosis not present

## 2011-09-04 DIAGNOSIS — D3130 Benign neoplasm of unspecified choroid: Secondary | ICD-10-CM | POA: Diagnosis not present

## 2011-09-04 LAB — REMOTE PACEMAKER DEVICE
ATRIAL PACING PM: 17
BMOD-0003RA: 15
BMOD-0005RA: 80 {beats}/min

## 2011-09-14 DIAGNOSIS — H3320 Serous retinal detachment, unspecified eye: Secondary | ICD-10-CM | POA: Diagnosis not present

## 2011-09-14 DIAGNOSIS — H4720 Unspecified optic atrophy: Secondary | ICD-10-CM | POA: Diagnosis not present

## 2011-09-14 DIAGNOSIS — H5340 Unspecified visual field defects: Secondary | ICD-10-CM | POA: Diagnosis not present

## 2011-09-18 ENCOUNTER — Encounter: Payer: Self-pay | Admitting: *Deleted

## 2011-09-20 DIAGNOSIS — H5340 Unspecified visual field defects: Secondary | ICD-10-CM | POA: Diagnosis not present

## 2011-10-09 ENCOUNTER — Ambulatory Visit (INDEPENDENT_AMBULATORY_CARE_PROVIDER_SITE_OTHER): Payer: Medicare Other | Admitting: *Deleted

## 2011-10-09 DIAGNOSIS — I4891 Unspecified atrial fibrillation: Secondary | ICD-10-CM

## 2011-10-09 DIAGNOSIS — Z7901 Long term (current) use of anticoagulants: Secondary | ICD-10-CM | POA: Diagnosis not present

## 2011-10-10 ENCOUNTER — Telehealth: Payer: Self-pay | Admitting: Cardiology

## 2011-10-10 NOTE — Telephone Encounter (Signed)
Patient called stated she has been having chest pain radiating into left arm.States has 3 episodes since this past Saturday 10/07/11.States at present she is not having chest pain but her left arm is sore.Appointment scheduled with Norma Fredrickson NP 10/12/11.Advised to go to ER if needed.

## 2011-10-10 NOTE — Telephone Encounter (Signed)
Pt having a lot of pain in arm, called yesterday and was told to increase med which she did but only one tab so far, pls advise

## 2011-10-11 DIAGNOSIS — R94111 Abnormal electroretinogram [ERG]: Secondary | ICD-10-CM | POA: Diagnosis not present

## 2011-10-11 DIAGNOSIS — H348192 Central retinal vein occlusion, unspecified eye, stable: Secondary | ICD-10-CM | POA: Diagnosis not present

## 2011-10-12 ENCOUNTER — Ambulatory Visit (INDEPENDENT_AMBULATORY_CARE_PROVIDER_SITE_OTHER): Payer: Medicare Other | Admitting: Nurse Practitioner

## 2011-10-12 ENCOUNTER — Ambulatory Visit (INDEPENDENT_AMBULATORY_CARE_PROVIDER_SITE_OTHER)
Admission: RE | Admit: 2011-10-12 | Discharge: 2011-10-12 | Disposition: A | Discharge status: Home / Self Care | Payer: Medicare Other | Source: Ambulatory Visit | Attending: Nurse Practitioner | Admitting: Nurse Practitioner

## 2011-10-12 ENCOUNTER — Encounter: Payer: Self-pay | Admitting: Nurse Practitioner

## 2011-10-12 VITALS — BP 140/76 | HR 57 | Ht 65.0 in | Wt 115.0 lb

## 2011-10-12 DIAGNOSIS — I4891 Unspecified atrial fibrillation: Secondary | ICD-10-CM

## 2011-10-12 DIAGNOSIS — Z950 Presence of cardiac pacemaker: Secondary | ICD-10-CM

## 2011-10-12 DIAGNOSIS — R5381 Other malaise: Secondary | ICD-10-CM | POA: Diagnosis not present

## 2011-10-12 DIAGNOSIS — W19XXXA Unspecified fall, initial encounter: Secondary | ICD-10-CM

## 2011-10-12 DIAGNOSIS — G459 Transient cerebral ischemic attack, unspecified: Secondary | ICD-10-CM | POA: Diagnosis not present

## 2011-10-12 LAB — CBC WITH DIFFERENTIAL/PLATELET
Basophils Absolute: 0 10*3/uL (ref 0.0–0.1)
Basophils Relative: 0.5 % (ref 0.0–3.0)
Eosinophils Absolute: 0 10*3/uL (ref 0.0–0.7)
Eosinophils Relative: 0.9 % (ref 0.0–5.0)
HCT: 42.2 % (ref 36.0–46.0)
Hemoglobin: 14.2 g/dL (ref 12.0–15.0)
Lymphocytes Relative: 24.2 % (ref 12.0–46.0)
Lymphs Abs: 1.3 10*3/uL (ref 0.7–4.0)
MCHC: 33.7 g/dL (ref 30.0–36.0)
MCV: 96 fl (ref 78.0–100.0)
Monocytes Absolute: 0.5 10*3/uL (ref 0.1–1.0)
Monocytes Relative: 9.4 % (ref 3.0–12.0)
Neutro Abs: 3.4 10*3/uL (ref 1.4–7.7)
Neutrophils Relative %: 65 % (ref 43.0–77.0)
Platelets: 124 10*3/uL — ABNORMAL LOW (ref 150.0–400.0)
RBC: 4.4 Mil/uL (ref 3.87–5.11)
RDW: 13.1 % (ref 11.5–14.6)
WBC: 5.3 10*3/uL (ref 4.5–10.5)

## 2011-10-12 LAB — BASIC METABOLIC PANEL
BUN: 22 mg/dL (ref 6–23)
CO2: 28 mEq/L (ref 19–32)
Calcium: 8.7 mg/dL (ref 8.4–10.5)
Chloride: 103 mEq/L (ref 96–112)
Creatinine, Ser: 0.9 mg/dL (ref 0.4–1.2)
GFR: 60.7 mL/min (ref >60.00)
Glucose, Bld: 81 mg/dL (ref 70–99)
Potassium: 4 mEq/L (ref 3.5–5.1)
Sodium: 139 mEq/L (ref 135–145)

## 2011-10-12 NOTE — Assessment & Plan Note (Signed)
Device is checked today. She has had no recent spells of atrial fib.

## 2011-10-12 NOTE — Assessment & Plan Note (Addendum)
She does look to be a little more weaker to me on the left side. Will arrange for CT of the head. She is not a candidate for a MRI. Will need labs and echo as well. Will need to discuss with Dr. [place]and. Her social situation is not good in the fact that she lives alone.   Her CT of the head today shows no acute abnormality. She has had prior stroke that is noted. May have some chronic weakness on that side as a baseline. She probably has had a TIA. Her case is reviewed with Dr. Sw[place]iland. He would like to continue her coumadin for now. He does agree that if she continues to fall, then it will need to be stopped. Will proceed with an echo early next week. We will check some labs today. She is encouraged to use a walker and take it easy. She has follow up planned in September with Dr. Sw[place]iland and for now, we will keep that appointment. Patient is agreeable to this plan and will call if any problems develop in the interim.

## 2011-10-12 NOTE — Patient Instructions (Addendum)
We are going to check a CT of your head today  We are going to check your pacemaker today  We are going to check labs today  We need to arrange for an ultrasound of your heart  Stay on your current medicines.  Call the [facility] office at [phone] if you have any questions, problems or concerns.

## 2011-10-12 NOTE — Assessment & Plan Note (Addendum)
She appears to be in sinus today. Will check her device. She is on Tikosyn. EKG shows sinus today. QT is 480 with a QTc of 467. She does remain on her coumadin and her level earlier this week was 1.8. She is now falling. This will be a very tenuous situation.

## 2011-10-12 NOTE — Progress Notes (Signed)
Katrina Rodriguez Date of Birth: 02/20/1929 Medical Record #[id number]  History of Present Illness: Katrina Rodriguez is seen today for a work in visit. She is seen for Dr. Sw[place]and. She has a history of atrial fib on Tikosyn, prior CVA, chronic anticoagulation, chronic chest pain with normal coronaries per cath in 2005 and negative nuclear in 2010 & in 2013, palpitations, hyperthyroidism, retinal detachment and underlying pacemaker. Do not see a recent echo.   She comes in today. She is here alone.  She called earlier this week to talk with Katrina Rodriguez about chest pain that has been radiating down her left arm. Her left arm felt sore. Her last visit here was back in May and she was felt to be doing ok. She tends to have fluctuation in her blood pressures and will have dizziness and near syncope if her blood pressure is too low. 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 She is no longer driving.  She tells me that she was doing ok up until this past Saturday. She had chest pain that went to her left arm. Her left elbow feels sore and she said her hand was cold and clammy. She felt like she was having to lift her arm with her right one. Her chest pain went away. Her heart rate may have been erratic.  Her left leg/knee is now giving out. She fell at home on Monday. No injury. Almost fell at [facility] yesterday during her eye exam. Speech has been ok.   Current Outpatient Prescriptions on File Prior to Visit  Medication Sig Dispense Refill  . atenolol (TENORMIN) 50 MG tablet Take 1 tablet (50 mg total) by mouth daily.  90 tablet  3  . dofetilide (TIKOSYN) 250 MCG capsule Take 1 capsule (250 mcg total) by mouth 2 (two) times daily.  180 capsule  3  . fludrocortisone (FLORINEF) 0.1 MG tablet Take 2 tablets (0.2 mg) twice a day  360 tablet  3  . potassium chloride SA (K-DUR,KLOR-CON) 20 MEQ tablet Take 1 tablet (20 mEq total) by mouth daily. Take 2 tablets daily  180  tablet  3  . simvastatin (ZOCOR) 10 MG tablet Take 1 tablet (10 mg total) by mouth at bedtime.  90 tablet  3  . warfarin (COUMADIN) 4 MG tablet Take 1 tablet (4 mg total) by mouth as directed.  90 tablet  1    Allergies  Allergen Reactions  . Amoxicillin   . Codeine   . Darvocet (Propoxyphene-Acetaminophen)   . Demerol   . Penicillins     Past Medical History  Diagnosis Date  . Atrial fibrillation   . Thrombocytopenia   . H/O: CVA (cardiovascular accident)     right internal capsule  . Orthostasis   . Anticoagulant long-term use   . Pacemaker   . Tachycardia-bradycardia syndrome   . Mild pulmonary hypertension   . Chronic anemia   . Syncope, cardiogenic   . Chest pain     Has normal coronaries per cath in 2005; negative nuclear in 2010  . Palpitation   . Hyperthyroidism   . Retinal detachment   . High risk medication use     Tikosyn & Coumadin    Past Surgical History  Procedure Date  . Cardiac pacemaker placement   . Cardiac catheterization   . Retinal detachment surgery   . Afib ablation     by Dr Johney Frame  History  Smoking status  . Never Smoker   Smokeless tobacco  . Never Used    History  Alcohol Use No    Family History  Problem Relation Age of Onset  . Heart disease Father   . Heart disease Brother   . Heart disease Brother     Review of Systems: The review of systems is per the HPI.  All other systems were reviewed and are negative.  Physical Exam: BP 140/76  Pulse 57  Ht 5\' 5"  (1.651 m)  Wt 115 lb (52.164 kg)  BMI 19.14 kg/m2 Patient is very pleasant and in no acute distress. She looks quite frail and thin. She is unsteady on her feet. Skin is warm and dry. Color is normal.  HEENT is unremarkable. Normocephalic/atraumatic. PERRL. Sclera are nonicteric. Neck is supple. No masses. No JVD. Lungs are clear. Cardiac exam shows a regular rate and rhythm. Abdomen is soft. Extremities are without edema. Gait is unsteady. ROM appears intact. She  does seem to be weaker on her left side to me.   LABORATORY DATA: EKG today shows sinus bradycardia.   Lab Results  Component Value Date   WBC 4.6 12/05/2010   HGB 12.4 12/05/2010   HCT 37.0 12/05/2010   PLT 104* 12/05/2010   GLUCOSE 84 12/22/2010   CHOL 133 12/02/2010   TRIG 56 12/02/2010   HDL 51 12/02/2010   LDLCALC 71 12/02/2010   ALT 11 12/21/2009   AST 18 12/21/2009   NA 144 12/22/2010   K 3.6 12/22/2010   CL 105 12/22/2010   CREATININE 1.3* 12/22/2010   BUN 17 12/22/2010   CO2 30 12/22/2010   TSH 1.753 12/02/2010   INR 1.8 10/09/2011   HGBA1C  Value: 5.6 (NOTE)                                                                       According to the ADA Clinical Practice Recommendations for 2011, when HbA1c is used as a screening test:   >=6.5%   Diagnostic of Diabetes Mellitus           (if abnormal result  is confirmed)  5.7-6.4%   Increased risk of developing Diabetes Mellitus  References:Diagnosis and Classification of Diabetes Mellitus,Diabetes Care,2011,34(Suppl 1):S62-S69 and Standards of Medical Care in         Diabetes - 2011,Diabetes Care,2011,34  (Suppl 1):S11-S61. 12/21/2009   Lab Results  Component Value Date   INR 1.8 10/09/2011   INR 1.8 09/04/2011   INR 2.1 07/19/2011   Myoview Overall Impression From February 2013:   Normal stress nuclear study. Baseline ECG with atrial pacing  CT of the Head  Findings: Left globe vitreous replacement. Bilateral lens extractions. Intracranial atherosclerosis. No mass lesion, mass effect, midline shift, hydrocephalus, hemorrhage. No territorial ischemia or acute infarction. Tiny right basal ganglia lacunar infarct appears similar to prior. Paranasal sinuses and mastoid air cells appear normal.  IMPRESSION: No acute abnormality or interval change.  Original Report Authenticated By: Andreas Newport, M.D.  Assessment / Plan:

## 2011-10-13 ENCOUNTER — Telehealth: Payer: Self-pay | Admitting: *Deleted

## 2011-10-13 ENCOUNTER — Telehealth: Payer: Self-pay | Admitting: Nurse Practitioner

## 2011-10-13 NOTE — Telephone Encounter (Signed)
New msg Pt said she still has been falling today. Please call

## 2011-10-13 NOTE — Telephone Encounter (Signed)
Patients labs were discussed with her during previous conversation with Elnita Maxwell about her fall.  Vista Mink, CMA

## 2011-10-13 NOTE — Telephone Encounter (Signed)
Message copied by Awilda Bill on Fri Oct 13, 2011  4:29 PM ------      Message from: Rosalio Macadamia      Created: Fri Oct 13, 2011  7:46 AM       Ok to report. Labs are satisfactory.

## 2011-10-13 NOTE — Telephone Encounter (Signed)
Patient called stated when she left office yesterday 10/12/11 she had severe pain in left elbow and is having left arm weakness.States this morning her left leg gave way and she almost fell.Spoke to Norma Fredrickson NP she advised only take tylenol as needed for pain.Advised to use walker when up walking.Advised to call back if needed.

## 2011-10-13 NOTE — Telephone Encounter (Signed)
Katrina Rodriguez, Can you call her and talk with her. If she has hit her head, she needs to go to the ER. She may just need to go anyway. She lives alone. Has had probable recent TIA.

## 2011-10-16 ENCOUNTER — Ambulatory Visit (INDEPENDENT_AMBULATORY_CARE_PROVIDER_SITE_OTHER): Payer: Medicare Other | Admitting: *Deleted

## 2011-10-16 ENCOUNTER — Ambulatory Visit (HOSPITAL_COMMUNITY): Payer: Medicare Other | Attending: Internal Medicine | Admitting: Radiology

## 2011-10-16 DIAGNOSIS — I2789 Other specified pulmonary heart diseases: Secondary | ICD-10-CM | POA: Insufficient documentation

## 2011-10-16 DIAGNOSIS — G459 Transient cerebral ischemic attack, unspecified: Secondary | ICD-10-CM | POA: Insufficient documentation

## 2011-10-16 DIAGNOSIS — I4891 Unspecified atrial fibrillation: Secondary | ICD-10-CM

## 2011-10-16 DIAGNOSIS — R079 Chest pain, unspecified: Secondary | ICD-10-CM | POA: Diagnosis not present

## 2011-10-16 DIAGNOSIS — Z8673 Personal history of transient ischemic attack (TIA), and cerebral infarction without residual deficits: Secondary | ICD-10-CM | POA: Diagnosis not present

## 2011-10-16 DIAGNOSIS — Z7901 Long term (current) use of anticoagulants: Secondary | ICD-10-CM

## 2011-10-16 DIAGNOSIS — I359 Nonrheumatic aortic valve disorder, unspecified: Secondary | ICD-10-CM | POA: Insufficient documentation

## 2011-10-16 DIAGNOSIS — I079 Rheumatic tricuspid valve disease, unspecified: Secondary | ICD-10-CM | POA: Insufficient documentation

## 2011-10-16 DIAGNOSIS — I059 Rheumatic mitral valve disease, unspecified: Secondary | ICD-10-CM | POA: Diagnosis not present

## 2011-10-16 NOTE — Progress Notes (Signed)
Echocardiogram performed.  

## 2011-10-30 ENCOUNTER — Ambulatory Visit (INDEPENDENT_AMBULATORY_CARE_PROVIDER_SITE_OTHER): Payer: Medicare Other | Admitting: *Deleted

## 2011-10-30 DIAGNOSIS — Z7901 Long term (current) use of anticoagulants: Secondary | ICD-10-CM | POA: Diagnosis not present

## 2011-10-30 DIAGNOSIS — I4891 Unspecified atrial fibrillation: Secondary | ICD-10-CM

## 2011-11-14 DIAGNOSIS — H4720 Unspecified optic atrophy: Secondary | ICD-10-CM | POA: Diagnosis not present

## 2011-11-14 DIAGNOSIS — H348192 Central retinal vein occlusion, unspecified eye, stable: Secondary | ICD-10-CM | POA: Diagnosis not present

## 2011-11-14 DIAGNOSIS — H5340 Unspecified visual field defects: Secondary | ICD-10-CM | POA: Diagnosis not present

## 2011-11-22 ENCOUNTER — Encounter: Payer: Self-pay | Admitting: Cardiology

## 2011-11-22 ENCOUNTER — Ambulatory Visit (INDEPENDENT_AMBULATORY_CARE_PROVIDER_SITE_OTHER): Payer: Medicare Other | Admitting: Cardiology

## 2011-11-22 ENCOUNTER — Ambulatory Visit (INDEPENDENT_AMBULATORY_CARE_PROVIDER_SITE_OTHER): Payer: Medicare Other | Admitting: *Deleted

## 2011-11-22 VITALS — BP 120/78 | HR 64 | Ht 65.0 in | Wt 113.0 lb

## 2011-11-22 DIAGNOSIS — I495 Sick sinus syndrome: Secondary | ICD-10-CM | POA: Diagnosis not present

## 2011-11-22 DIAGNOSIS — I4891 Unspecified atrial fibrillation: Secondary | ICD-10-CM

## 2011-11-22 DIAGNOSIS — R55 Syncope and collapse: Secondary | ICD-10-CM | POA: Diagnosis not present

## 2011-11-22 DIAGNOSIS — R079 Chest pain, unspecified: Secondary | ICD-10-CM

## 2011-11-22 DIAGNOSIS — Z7901 Long term (current) use of anticoagulants: Secondary | ICD-10-CM

## 2011-11-22 LAB — POCT INR: INR: 2.1

## 2011-11-22 MED ORDER — ATENOLOL 50 MG PO TABS: 50.0000 mg | ORAL_TABLET | Freq: Every day | ORAL | Status: DC

## 2011-11-22 NOTE — Patient Instructions (Signed)
Continue your current therapy  I will see you again in 4 months. 

## 2011-11-22 NOTE — Progress Notes (Signed)
Katrina Rodriguez Date of Birth: [birth year] Medical Record #[id number]  History of Present Illness: Katrina Rodriguez is seen today for a followup visit. She has a history of atrial fibrillation. She is on chronic Tikosyn therapy. She has had a previous atrial fibrillation ablation. She has a history of chronic chest pain with normal coronary anatomy by cardiac catheterization. Showed a normal nuclear stress test earlier this year. She has a history of recurrent syncope that is multifactorial. Her major complaint since her last visit is of her declining vision. She has been evaluated by multiple eye specialists. She has optic nerve injury. She reports that she cannot take simvastatin because this resulted in significant hair loss. She still notes some left leg weakness related to her previous CVA. Blood pressure has been somewhat labile recently.  Current Outpatient Prescriptions on File Prior to Visit  Medication Sig Dispense Refill  . dofetilide (TIKOSYN) 250 MCG capsule Take 1 capsule (250 mcg total) by mouth 2 (two) times daily.  180 capsule  3  . fludrocortisone (FLORINEF) 0.1 MG tablet Take 2 tablets (0.2 mg) twice a day  360 tablet  3  . potassium chloride SA (K-DUR,KLOR-CON) 20 MEQ tablet Take 1 tablet (20 mEq total) by mouth daily. Take 2 tablets daily  180 tablet  3  . warfarin (COUMADIN) 4 MG tablet Take 1 tablet (4 mg total) by mouth as directed.  90 tablet  1  . DISCONTD: atenolol (TENORMIN) 50 MG tablet Take 1 tablet (50 mg total) by mouth daily.  90 tablet  3    Allergies  Allergen Reactions  . Amoxicillin   . Codeine   . Darvocet (Propoxyphene-Acetaminophen)   . Demerol   . Penicillins     Past Medical History  Diagnosis Date  . Atrial fibrillation   . Thrombocytopenia   . H/O: CVA (cardiovascular accident)     right internal capsule  . Orthostasis   . Anticoagulant long-term use   . Pacemaker   . Tachycardia-bradycardia syndrome   . Mild pulmonary hypertension   . Chronic anemia    . Syncope, cardiogenic   . Chest pain     Has normal coronaries per cath in 2005; negative nuclear in 2010  . Palpitation   . Hyperthyroidism   . Retinal detachment   . High risk medication use     Tikosyn & Coumadin    Past Surgical History  Procedure Date  . Cardiac pacemaker placement   . Cardiac catheterization   . Retinal detachment surgery   . Afib ablation     by Dr Johney Frame    History  Smoking status  . Never Smoker   Smokeless tobacco  . Never Used    History  Alcohol Use No    Family History  Problem Relation Age of Onset  . Heart disease Father   . Heart disease Brother   . Heart disease Brother     Review of Systems: The review of systems is positive for recurrent near-syncope. No falls. No bleeding or bruising. She is still fiercely independent but no longer able to drive. All other systems were reviewed and are negative.  Physical Exam: BP 120/78  Pulse 64  Ht 5\' 5"  (1.651 m)  Wt 113 lb (51.256 kg)  BMI 18.80 kg/m2 Patient is very pleasant and in no acute distress. Skin is warm and dry. Color is normal.  HEENT is unremarkable. Normocephalic/atraumatic. PERRL. Sclera are nonicteric. Neck is supple. No masses. No JVD. Lungs are clear.  Cardiac exam shows a regular rate and rhythm. Abdomen is soft. Extremities are without edema. Gait and ROM are intact but she is a little unsteady. No gross neurologic deficits noted.   LABORATORY DATA:  INR today is pending.  Assessment / Plan: 1. Atrial fibrillation. She appears to be maintaining sinus rhythm while on Tikosyn. Status post ablation. With prior CVA she needs to remain on Coumadin.  2. Chronic chest pain. Ischemic evaluation has been negative.  3. History of CVA.  4. Sick sinus syndrome status post pacemaker implant.  5. Recurrent syncope, multifactorial. She is on chronic Florinef.

## 2011-12-11 ENCOUNTER — Encounter: Payer: Self-pay | Admitting: Internal Medicine

## 2011-12-11 ENCOUNTER — Ambulatory Visit (INDEPENDENT_AMBULATORY_CARE_PROVIDER_SITE_OTHER): Payer: Medicare Other | Admitting: *Deleted

## 2011-12-11 DIAGNOSIS — I4891 Unspecified atrial fibrillation: Secondary | ICD-10-CM

## 2011-12-11 DIAGNOSIS — Z950 Presence of cardiac pacemaker: Secondary | ICD-10-CM | POA: Diagnosis not present

## 2011-12-14 LAB — REMOTE PACEMAKER DEVICE
AL AMPLITUDE: 2.8 mv
ATRIAL PACING PM: 22
BMOD-0003RA: 15
BMOD-0005RA: 80 {beats}/min

## 2011-12-18 ENCOUNTER — Ambulatory Visit (INDEPENDENT_AMBULATORY_CARE_PROVIDER_SITE_OTHER): Payer: Medicare Other | Admitting: *Deleted

## 2011-12-18 DIAGNOSIS — I4891 Unspecified atrial fibrillation: Secondary | ICD-10-CM | POA: Diagnosis not present

## 2011-12-18 DIAGNOSIS — Z7901 Long term (current) use of anticoagulants: Secondary | ICD-10-CM

## 2011-12-21 ENCOUNTER — Encounter: Payer: Self-pay | Admitting: *Deleted

## 2011-12-28 DIAGNOSIS — Z760 Encounter for issue of repeat prescription: Secondary | ICD-10-CM | POA: Diagnosis not present

## 2011-12-28 DIAGNOSIS — H5340 Unspecified visual field defects: Secondary | ICD-10-CM | POA: Diagnosis not present

## 2011-12-28 DIAGNOSIS — H4720 Unspecified optic atrophy: Secondary | ICD-10-CM | POA: Diagnosis not present

## 2011-12-28 DIAGNOSIS — H348192 Central retinal vein occlusion, unspecified eye, stable: Secondary | ICD-10-CM | POA: Diagnosis not present

## 2012-01-08 ENCOUNTER — Ambulatory Visit (INDEPENDENT_AMBULATORY_CARE_PROVIDER_SITE_OTHER): Payer: Medicare Other

## 2012-01-08 DIAGNOSIS — I4891 Unspecified atrial fibrillation: Secondary | ICD-10-CM | POA: Diagnosis not present

## 2012-01-08 DIAGNOSIS — Z7901 Long term (current) use of anticoagulants: Secondary | ICD-10-CM

## 2012-01-23 DIAGNOSIS — E538 Deficiency of other specified B group vitamins: Secondary | ICD-10-CM | POA: Diagnosis not present

## 2012-01-29 ENCOUNTER — Other Ambulatory Visit: Payer: Self-pay | Admitting: Internal Medicine

## 2012-01-29 MED ORDER — DOFETILIDE 250 MCG PO CAPS: 250.0000 ug | ORAL_CAPSULE | Freq: Two times a day (BID) | ORAL | Status: DC

## 2012-01-29 NOTE — Telephone Encounter (Signed)
Pt is in the donut hole and needs 1 month called in

## 2012-02-02 ENCOUNTER — Encounter: Payer: Self-pay | Admitting: *Deleted

## 2012-02-09 ENCOUNTER — Ambulatory Visit (INDEPENDENT_AMBULATORY_CARE_PROVIDER_SITE_OTHER): Payer: Medicare Other | Admitting: Internal Medicine

## 2012-02-09 ENCOUNTER — Ambulatory Visit (INDEPENDENT_AMBULATORY_CARE_PROVIDER_SITE_OTHER): Payer: Medicare Other

## 2012-02-09 ENCOUNTER — Encounter: Payer: Self-pay | Admitting: Internal Medicine

## 2012-02-09 VITALS — BP 134/70 | HR 56 | Ht 65.0 in | Wt 114.0 lb

## 2012-02-09 DIAGNOSIS — E538 Deficiency of other specified B group vitamins: Secondary | ICD-10-CM | POA: Diagnosis not present

## 2012-02-09 DIAGNOSIS — I495 Sick sinus syndrome: Secondary | ICD-10-CM

## 2012-02-09 DIAGNOSIS — Z7901 Long term (current) use of anticoagulants: Secondary | ICD-10-CM | POA: Diagnosis not present

## 2012-02-09 DIAGNOSIS — Z950 Presence of cardiac pacemaker: Secondary | ICD-10-CM

## 2012-02-09 DIAGNOSIS — I4891 Unspecified atrial fibrillation: Secondary | ICD-10-CM | POA: Diagnosis not present

## 2012-02-09 LAB — PACEMAKER DEVICE OBSERVATION
AL IMPEDENCE PM: 657 Ohm
ATRIAL PACING PM: 20
BATTERY VOLTAGE: 2.67 V
BMOD-0003RA: 15
BMOD-0005RA: 80 {beats}/min
BRDY-0002RV: 50 {beats}/min
RV LEAD AMPLITUDE: 2.8 mv

## 2012-02-09 NOTE — Progress Notes (Signed)
PCP: Allean Found, MD Primary Cardiologist:  Dr Sw[place]and  Katrina Rodriguez is a 78 y.o. female who presents today for routine electrophysiology followup.  Since last being seen in our clinic, the patient reports doing well.  Her primary concern is with her vision which continues to deteriorate.  Today, she denies symptoms of palpitations, chest pain, shortness of breath,  lower extremity edema, dizziness, presyncope, or syncope.  The patient is otherwise without complaint today.   Past Medical History  Diagnosis Date  . Atrial fibrillation   . Thrombocytopenia   . H/O: CVA (cardiovascular accident)     right internal capsule  . Orthostasis   . Anticoagulant long-term use   . Pacemaker   . Tachycardia-bradycardia syndrome   . Mild pulmonary hypertension   . Chronic anemia   . Syncope, cardiogenic   . Chest pain     Has normal coronaries per cath in 2005; negative nuclear in 2010  . Palpitation   . Hyperthyroidism   . Retinal detachment   . High risk medication use     Tikosyn & Coumadin   Past Surgical History  Procedure Date  . Cardiac pacemaker placement   . Cardiac catheterization   . Retinal detachment surgery   . Afib ablation     by Dr Johney Frame    Current Outpatient Prescriptions  Medication Sig Dispense Refill  . atenolol (TENORMIN) 50 MG tablet Take 1 tablet (50 mg total) by mouth daily.  90 tablet  3  . dofetilide (TIKOSYN) 250 MCG capsule Take 1 capsule (250 mcg total) by mouth 2 (two) times daily.  60 capsule  1  . fludrocortisone (FLORINEF) 0.1 MG tablet Take 2 tablets (0.2 mg) twice a day  360 tablet  3  . potassium chloride SA (K-DUR,KLOR-CON) 20 MEQ tablet Take 1 tablet (20 mEq total) by mouth daily. Take 2 tablets daily  180 tablet  3  . warfarin (COUMADIN) 4 MG tablet Take 1 tablet (4 mg total) by mouth as directed.  90 tablet  1    Physical Exam: Filed Vitals:   02/09/12 1033  BP: 134/70  Pulse: 56  Height: 5\' 5"  (1.651 m)  Weight: 114 lb (51.71 kg)   SpO2: 96%    GEN- The patient is well appearing, alert and oriented x 3 today.   Head- normocephalic, atraumatic Eyes-  Sclera clear, conjunctiva pink, very poor vision Ears- hearing intact Oropharynx- clear Lungs- Clear to ausculation bilaterally, normal work of breathing Chest- pacemaker pocket is well healed Heart- Regular rate and rhythm, no murmurs, rubs or gallops, PMI not laterally displaced GI- soft, NT, ND, + BS Extremities- no clubbing, cyanosis, or edema  Pacemaker interrogation- reviewed in detail today,  See PACEART report  Assessment and Plan:  1. Bradycardia Normal pacemaker function See Pace Art report No changes today  2. afib Maintaining sinus rhythm post ablation No afib since last interrogation Continue long term anticoagulation Given labile INRs she would be a good candidate for a noval anticoagulant.  She declines changes today. EKG and labs from 8/13 are reviewed   No changes  Return in 1 year Given approaching ERI, she is instructed to perform monthly carelink checks

## 2012-02-09 NOTE — Patient Instructions (Addendum)
Remote monitoring is used to monitor your Pacemaker of ICD from home. This monitoring reduces the number of office visits required to check your device to one time per year. It allows [place] to keep an eye on the functioning of your device to ensure it is working properly. You are scheduled for a device check from home on March 19, 2011. You may send your transmission at any time that day. If you have a wireless device, the transmission will be sent automatically. After your physician reviews your transmission, you will receive a postcard with your next transmission date.  Your physician wants you to follow-up in: 1 year with Dr Johney Frame.  You will receive a reminder letter in the mail two months in advance. If you don't receive a letter, please call our office to schedule the follow-up appointment.

## 2012-02-21 ENCOUNTER — Encounter: Payer: Self-pay | Admitting: Internal Medicine

## 2012-02-23 ENCOUNTER — Ambulatory Visit (INDEPENDENT_AMBULATORY_CARE_PROVIDER_SITE_OTHER): Payer: Medicare Other | Admitting: Pharmacist

## 2012-02-23 DIAGNOSIS — I4891 Unspecified atrial fibrillation: Secondary | ICD-10-CM

## 2012-02-23 DIAGNOSIS — Z7901 Long term (current) use of anticoagulants: Secondary | ICD-10-CM

## 2012-02-23 LAB — POCT INR: INR: 3.4

## 2012-03-11 ENCOUNTER — Ambulatory Visit (INDEPENDENT_AMBULATORY_CARE_PROVIDER_SITE_OTHER): Payer: Medicare Other | Admitting: *Deleted

## 2012-03-11 ENCOUNTER — Other Ambulatory Visit: Payer: Self-pay | Admitting: Cardiology

## 2012-03-11 DIAGNOSIS — I4891 Unspecified atrial fibrillation: Secondary | ICD-10-CM

## 2012-03-11 DIAGNOSIS — Z7901 Long term (current) use of anticoagulants: Secondary | ICD-10-CM | POA: Diagnosis not present

## 2012-03-11 LAB — POCT INR: INR: 2.9

## 2012-03-11 MED ORDER — WARFARIN SODIUM 4 MG PO TABS: 4.0000 mg | ORAL_TABLET | ORAL | Status: DC

## 2012-03-11 MED ORDER — POTASSIUM CHLORIDE CRYS ER 20 MEQ PO TBCR: 20.0000 meq | EXTENDED_RELEASE_TABLET | Freq: Every day | ORAL | Status: DC

## 2012-03-11 MED ORDER — FLUDROCORTISONE ACETATE 0.1 MG PO TABS: ORAL_TABLET | ORAL | Status: DC

## 2012-03-18 ENCOUNTER — Ambulatory Visit (INDEPENDENT_AMBULATORY_CARE_PROVIDER_SITE_OTHER): Payer: Medicare Other | Admitting: *Deleted

## 2012-03-18 DIAGNOSIS — Z950 Presence of cardiac pacemaker: Secondary | ICD-10-CM

## 2012-03-18 LAB — REMOTE PACEMAKER DEVICE
AL AMPLITUDE: 2.8 mv
AL IMPEDENCE PM: 600 Ohm
BRDY-0002RA: 50 {beats}/min

## 2012-03-19 ENCOUNTER — Telehealth: Payer: Self-pay | Admitting: Cardiology

## 2012-03-19 NOTE — Telephone Encounter (Signed)
[facility] calling re potassium verfication on directions

## 2012-03-21 NOTE — Telephone Encounter (Signed)
Spoke to pharmacist at [facility] wanting to verify potassium directions.Patient takes kdur 20 meq twice a day.Pharmacist stated will get prescription ready and send to patient.

## 2012-03-27 ENCOUNTER — Ambulatory Visit (INDEPENDENT_AMBULATORY_CARE_PROVIDER_SITE_OTHER): Payer: Medicare Other | Admitting: Cardiology

## 2012-03-27 ENCOUNTER — Encounter: Payer: Self-pay | Admitting: Cardiology

## 2012-03-27 ENCOUNTER — Ambulatory Visit (INDEPENDENT_AMBULATORY_CARE_PROVIDER_SITE_OTHER): Payer: Medicare Other | Admitting: *Deleted

## 2012-03-27 VITALS — BP 122/76 | HR 52 | Ht 65.0 in | Wt 116.0 lb

## 2012-03-27 DIAGNOSIS — I4891 Unspecified atrial fibrillation: Secondary | ICD-10-CM

## 2012-03-27 DIAGNOSIS — R079 Chest pain, unspecified: Secondary | ICD-10-CM | POA: Diagnosis not present

## 2012-03-27 DIAGNOSIS — Z950 Presence of cardiac pacemaker: Secondary | ICD-10-CM

## 2012-03-27 DIAGNOSIS — Z7901 Long term (current) use of anticoagulants: Secondary | ICD-10-CM

## 2012-03-27 DIAGNOSIS — R55 Syncope and collapse: Secondary | ICD-10-CM | POA: Diagnosis not present

## 2012-03-27 NOTE — Patient Instructions (Signed)
Continue your current therapy  I will see you in 4 months  

## 2012-03-27 NOTE — Progress Notes (Signed)
Katrina Rodriguez Date of Birth: 07-Aug-1928 Medical Record #[id number]  History of Present Illness: Katrina Rodriguez is seen today for a followup visit. She has a history of atrial fibrillation. She is on chronic Tikosyn therapy. She has had a previous atrial fibrillation ablation. She has a history of chronic chest pain with normal coronary anatomy by cardiac catheterization. Showed a normal nuclear stress test last year. She has a history of recurrent syncope that is multifactorial.  She continues to have difficulty with declining vision. She is also noted some episodes where her left lower leg gives way when she is walking. This is the site of her previous stroke. She does use a cane.  Current Outpatient Prescriptions on File Prior to Visit  Medication Sig Dispense Refill  . atenolol (TENORMIN) 50 MG tablet Take 1 tablet (50 mg total) by mouth daily.  90 tablet  3  . dofetilide (TIKOSYN) 250 MCG capsule Take 1 capsule (250 mcg total) by mouth 2 (two) times daily.  60 capsule  1  . fludrocortisone (FLORINEF) 0.1 MG tablet Take 2 tablets (0.2 mg) twice a day  360 tablet  3  . potassium chloride SA (K-DUR,KLOR-CON) 20 MEQ tablet Take 1 tablet (20 mEq total) by mouth daily. Take 2 tablets daily  180 tablet  3  . warfarin (COUMADIN) 4 MG tablet Take 1 tablet (4 mg total) by mouth as directed.  90 tablet  3    Allergies  Allergen Reactions  . Amoxicillin   . Codeine   . Darvocet (Propoxyphene-Acetaminophen)   . Demerol   . Penicillins     Past Medical History  Diagnosis Date  . Atrial fibrillation   . Thrombocytopenia   . H/O: CVA (cardiovascular accident)     right internal capsule  . Orthostasis   . Anticoagulant long-term use   . Pacemaker   . Tachycardia-bradycardia syndrome   . Mild pulmonary hypertension   . Chronic anemia   . Syncope, cardiogenic   . Chest pain     Has normal coronaries per cath in 2005; negative nuclear in 2010  . Palpitation   . Hyperthyroidism   . Retinal  detachment   . High risk medication use     Tikosyn & Coumadin    Past Surgical History  Procedure Date  . Cardiac pacemaker placement   . Cardiac catheterization   . Retinal detachment surgery   . Afib ablation     by Dr Johney Frame    History  Smoking status  . Never Smoker   Smokeless tobacco  . Never Used    History  Alcohol Use No    Family History  Problem Relation Age of Onset  . Heart disease Father   . Heart disease Brother   . Heart disease Brother     Review of Systems: The review of systems is positive for recurrent near-syncope. No falls. No bleeding or bruising. She is still fiercely independent but no longer able to drive. All other systems were reviewed and are negative.  Physical Exam: BP 122/76  Pulse 52  Ht 5\' 5"  (1.651 m)  Wt 116 lb (52.617 kg)  BMI 19.30 kg/m2  SpO2 99% Patient is very pleasant and in no acute distress. Skin is warm and dry. Color is normal.  HEENT is unremarkable. Normocephalic/atraumatic. PERRL. Sclera are nonicteric. Neck is supple. No masses. No JVD. Lungs are clear. Cardiac exam shows a regular rate and rhythm. Abdomen is soft. Extremities are without edema. Gait and ROM  are intact but she is a little unsteady. No gross neurologic deficits noted.   LABORATORY DATA:  INR today is 3.4  Assessment / Plan: 1. Atrial fibrillation. She appears to be maintaining sinus rhythm while on Tikosyn. Status post ablation. With prior CVA she needs to remain on Coumadin.  2. Chronic chest pain. Ischemic evaluation has been negative.  3. History of CVA.  4. Sick sinus syndrome status post pacemaker implant. Patient has regular followup in pacemaker clinic.  5. Recurrent syncope, multifactorial. She is on chronic Florinef.

## 2012-04-01 ENCOUNTER — Encounter: Payer: Self-pay | Admitting: *Deleted

## 2012-04-03 ENCOUNTER — Telehealth: Payer: Self-pay | Admitting: Cardiology

## 2012-04-03 MED ORDER — DOFETILIDE 250 MCG PO CAPS: 250.0000 ug | ORAL_CAPSULE | Freq: Two times a day (BID) | ORAL | Status: DC

## 2012-04-03 NOTE — Telephone Encounter (Signed)
Pt needs refill of tikosyn 90 day supply [facility]

## 2012-04-09 ENCOUNTER — Telehealth: Payer: Self-pay | Admitting: Cardiology

## 2012-04-09 NOTE — Telephone Encounter (Signed)
Spoke with pt who report she felt bad Saturday night. Blood pressure Saturday night was 84/46 and then shortly after it was 164/111. Heart beat was regular but she was having chest pain. She went to church on Sunday and passed out in Sunday school.  She was driven to ED but refused to get out of the car. She had someone stay with her on Sunday. She reports she has continued to feel like she may pass out since that time. Blood pressures ranging from 84/48 last night to 160/70 this AM. Is taking all medications. Is eating OK and staying hydrated. Will review with Dr. Patty Sermons

## 2012-04-09 NOTE — Telephone Encounter (Signed)
Spoke with pt. She will come in to see Tereso Newcomer, PA at 3:40 on April 11, 2012.  She will also split Tenormin dose and take 25 mg in the AM and 25 mg in the PM.

## 2012-04-09 NOTE — Telephone Encounter (Signed)
Pt having problem with BP getting high, said its usually low, and some chest pain off and on, pls advise

## 2012-04-09 NOTE — Telephone Encounter (Signed)
Would have her come in this week for a followup office visit to possibly adjust meds.

## 2012-04-11 ENCOUNTER — Ambulatory Visit: Payer: Medicare Other | Admitting: Physician Assistant

## 2012-04-11 ENCOUNTER — Encounter: Payer: Self-pay | Admitting: Internal Medicine

## 2012-04-22 ENCOUNTER — Ambulatory Visit (INDEPENDENT_AMBULATORY_CARE_PROVIDER_SITE_OTHER): Payer: Medicare Other | Admitting: Pharmacist

## 2012-04-22 ENCOUNTER — Other Ambulatory Visit: Payer: Self-pay | Admitting: Internal Medicine

## 2012-04-22 ENCOUNTER — Ambulatory Visit (INDEPENDENT_AMBULATORY_CARE_PROVIDER_SITE_OTHER): Payer: Medicare Other | Admitting: *Deleted

## 2012-04-22 DIAGNOSIS — I4891 Unspecified atrial fibrillation: Secondary | ICD-10-CM | POA: Diagnosis not present

## 2012-04-22 DIAGNOSIS — Z7901 Long term (current) use of anticoagulants: Secondary | ICD-10-CM | POA: Diagnosis not present

## 2012-04-22 LAB — POCT INR: INR: 2

## 2012-05-01 LAB — REMOTE PACEMAKER DEVICE
ATRIAL PACING PM: 17
BATTERY VOLTAGE: 2.63 V
BMOD-0005RA: 80 {beats}/min

## 2012-05-13 ENCOUNTER — Ambulatory Visit (INDEPENDENT_AMBULATORY_CARE_PROVIDER_SITE_OTHER): Payer: Medicare Other | Admitting: *Deleted

## 2012-05-13 ENCOUNTER — Encounter: Payer: Self-pay | Admitting: *Deleted

## 2012-05-13 DIAGNOSIS — Z7901 Long term (current) use of anticoagulants: Secondary | ICD-10-CM | POA: Diagnosis not present

## 2012-05-13 DIAGNOSIS — I4891 Unspecified atrial fibrillation: Secondary | ICD-10-CM | POA: Diagnosis not present

## 2012-05-13 LAB — POCT INR: INR: 1.8

## 2012-05-15 ENCOUNTER — Encounter: Payer: Self-pay | Admitting: Internal Medicine

## 2012-05-27 ENCOUNTER — Ambulatory Visit (INDEPENDENT_AMBULATORY_CARE_PROVIDER_SITE_OTHER): Payer: Medicare Other | Admitting: *Deleted

## 2012-05-27 ENCOUNTER — Other Ambulatory Visit: Payer: Self-pay | Admitting: Internal Medicine

## 2012-05-27 DIAGNOSIS — I4891 Unspecified atrial fibrillation: Secondary | ICD-10-CM

## 2012-06-03 ENCOUNTER — Ambulatory Visit (INDEPENDENT_AMBULATORY_CARE_PROVIDER_SITE_OTHER): Payer: Medicare Other | Admitting: *Deleted

## 2012-06-03 DIAGNOSIS — Z7901 Long term (current) use of anticoagulants: Secondary | ICD-10-CM | POA: Diagnosis not present

## 2012-06-03 DIAGNOSIS — I4891 Unspecified atrial fibrillation: Secondary | ICD-10-CM | POA: Diagnosis not present

## 2012-06-03 LAB — POCT INR: INR: 2.1

## 2012-06-05 LAB — REMOTE PACEMAKER DEVICE
AL AMPLITUDE: 2.8 mv
AL IMPEDENCE PM: 616 Ohm
ATRIAL PACING PM: 16
BATTERY VOLTAGE: 2.6 V
BMOD-0005RA: 80 {beats}/min

## 2012-06-11 ENCOUNTER — Encounter (HOSPITAL_COMMUNITY): Payer: Self-pay | Admitting: *Deleted

## 2012-06-11 ENCOUNTER — Observation Stay (HOSPITAL_COMMUNITY)
Admission: EM | Admit: 2012-06-11 | Discharge: 2012-06-13 | Disposition: A | Discharge status: Home / Self Care | Payer: Medicare Other | Attending: Cardiology | Admitting: Cardiology

## 2012-06-11 ENCOUNTER — Telehealth: Payer: Self-pay | Admitting: Physician Assistant

## 2012-06-11 ENCOUNTER — Emergency Department (HOSPITAL_COMMUNITY): Payer: Medicare Other

## 2012-06-11 DIAGNOSIS — R002 Palpitations: Secondary | ICD-10-CM | POA: Diagnosis not present

## 2012-06-11 DIAGNOSIS — Z7901 Long term (current) use of anticoagulants: Secondary | ICD-10-CM | POA: Insufficient documentation

## 2012-06-11 DIAGNOSIS — R110 Nausea: Secondary | ICD-10-CM | POA: Diagnosis not present

## 2012-06-11 DIAGNOSIS — I495 Sick sinus syndrome: Secondary | ICD-10-CM | POA: Diagnosis present

## 2012-06-11 DIAGNOSIS — Z8673 Personal history of transient ischemic attack (TIA), and cerebral infarction without residual deficits: Secondary | ICD-10-CM | POA: Insufficient documentation

## 2012-06-11 DIAGNOSIS — R0602 Shortness of breath: Secondary | ICD-10-CM | POA: Insufficient documentation

## 2012-06-11 DIAGNOSIS — J438 Other emphysema: Secondary | ICD-10-CM | POA: Diagnosis not present

## 2012-06-11 DIAGNOSIS — I4891 Unspecified atrial fibrillation: Secondary | ICD-10-CM | POA: Diagnosis not present

## 2012-06-11 DIAGNOSIS — I97190 Other postprocedural cardiac functional disturbances following cardiac surgery: Principal | ICD-10-CM | POA: Insufficient documentation

## 2012-06-11 DIAGNOSIS — R079 Chest pain, unspecified: Secondary | ICD-10-CM | POA: Diagnosis present

## 2012-06-11 DIAGNOSIS — Z45018 Encounter for adjustment and management of other part of cardiac pacemaker: Secondary | ICD-10-CM | POA: Diagnosis not present

## 2012-06-11 DIAGNOSIS — I951 Orthostatic hypotension: Secondary | ICD-10-CM | POA: Diagnosis present

## 2012-06-11 DIAGNOSIS — Z950 Presence of cardiac pacemaker: Secondary | ICD-10-CM

## 2012-06-11 HISTORY — DX: Unspecified osteoarthritis, unspecified site: M19.90

## 2012-06-11 LAB — CBC
HCT: 39.1 % (ref 36.0–46.0)
MCV: 91.8 fL (ref 78.0–100.0)
Platelets: 125 10*3/uL — ABNORMAL LOW (ref 150–400)
RBC: 4.26 MIL/uL (ref 3.87–5.11)
WBC: 5.9 10*3/uL (ref 4.0–10.5)

## 2012-06-11 LAB — PROTIME-INR: INR: 1.54 — ABNORMAL HIGH (ref 0.00–1.49)

## 2012-06-11 LAB — GLUCOSE, CAPILLARY: Glucose-Capillary: 89 mg/dL (ref 70–99)

## 2012-06-11 MED ORDER — MORPHINE SULFATE 2 MG/ML IJ SOLN
2.0000 mg | Freq: Once | INTRAMUSCULAR | Status: AC
  Administered 2012-06-11: 2 mg via INTRAVENOUS
  Filled 2012-06-11: qty 1

## 2012-06-11 MED ORDER — DOFETILIDE 250 MCG PO CAPS
250.0000 ug | ORAL_CAPSULE | Freq: Once | ORAL | Status: AC
  Administered 2012-06-11: 250 ug via ORAL
  Filled 2012-06-11: qty 1

## 2012-06-11 NOTE — Telephone Encounter (Signed)
Patient called this evening to report chest pain that feels much different than her usual afib pain. It is a strong chest pressure/discomfort and her BP is up higher than normal - 171/60. She usually runs low. Her HR seems to be jumping around from normal to up. Has h/o minor CAD by cath in 2005 but that was quite a while ago. Given age, Tikosyn use and symptoms I advised she proceed to ER for evaluation. She will not drive herself. She agrees with this plan. Dayna Dunn PA-C

## 2012-06-11 NOTE — ED Notes (Signed)
Pt with hx of afib and pacemaker to ED c/o "shocks" of chest pain that started a 4 pm.  Denies sob, but pt with 1+ pitting edema bil LE.

## 2012-06-11 NOTE — ED Provider Notes (Signed)
I saw and evaluated the patient, reviewed the resident's note and I agree with the findings and plan. I personally evaluated the ECG and agree with the interpretation of the resident  Pt will be admitted for her CP and more importantly her pacemaker which appears to have such low batter life that it is no longer properly working. No heart block or bradycardia in ER  Lyanne Co, MD 06/11/12 2356

## 2012-06-11 NOTE — ED Provider Notes (Signed)
History     CSN: [id number]  Arrival date & time 06/11/12  4098   First MD Initiated Contact with Patient 06/11/12 1929     Chief Complaint  Patient presents with  . Chest Pain  . Leg Swelling   HPI  79 y/o female who presents with cc of chest pain. The patient states that she was in her usual state of health earlier today. She states that this afternoon she was cleaning her house and then attempted to take her trash can out when she developed chest pain. She states that the pain began at approximately 1600. She states at that time she had constant pain that lasted for two hours followed by intermittent pain. She states she is getting sharp "shocking" type pain which radiates to her left side. She denies any associated symptoms. The pain is intermittent without modifying factors.   Past Medical History  Diagnosis Date  . Atrial fibrillation   . Thrombocytopenia   . H/O: CVA (cardiovascular accident)     right internal capsule  . Orthostasis   . Anticoagulant long-term use   . Pacemaker   . Tachycardia-bradycardia syndrome   . Mild pulmonary hypertension   . Chronic anemia   . Syncope, cardiogenic   . Chest pain     Has normal coronaries per cath in 2005; negative nuclear in 2010  . Palpitation   . Hyperthyroidism   . Retinal detachment   . High risk medication use     Tikosyn & Coumadin  . Stroke     tia    Past Surgical History  Procedure Laterality Date  . Cardiac pacemaker placement    . Cardiac catheterization    . Retinal detachment surgery    . Afib ablation      by Dr Johney Frame    Family History  Problem Relation Age of Onset  . Heart disease Father   . Heart disease Brother   . Heart disease Brother     History  Substance Use Topics  . Smoking status: Never Smoker   . Smokeless tobacco: Never Used  . Alcohol Use: No    OB History   Grav Para Term Preterm Abortions TAB SAB Ect Mult Living                  Review of Systems  Constitutional:  Negative for fever and chills.  Respiratory: Positive for shortness of breath. Negative for cough.   Cardiovascular: Positive for chest pain and palpitations.  Gastrointestinal: Positive for nausea. Negative for vomiting and abdominal pain.  All other systems reviewed and are negative.    Allergies  Amiodarone; Amoxicillin; Codeine; Darvocet; Demerol; and Penicillins  Home Medications   Current Outpatient Rx  Name  Route  Sig  Dispense  Refill  . atenolol (TENORMIN) 50 MG tablet   Oral   Take 1 tablet (50 mg total) by mouth daily.   90 tablet   3   . dofetilide (TIKOSYN) 250 MCG capsule   Oral   Take 1 capsule (250 mcg total) by mouth 2 (two) times daily.   180 capsule   2   . fludrocortisone (FLORINEF) 0.1 MG tablet      Take 2 tablets (0.2 mg) twice a day   360 tablet   3   . potassium chloride SA (K-DUR,KLOR-CON) 20 MEQ tablet   Oral   Take 40 mEq by mouth daily.         Marland Kitchen warfarin (COUMADIN) 4 MG tablet  Oral   Take 4 mg by mouth every evening.           BP 111/87  Pulse 65  Temp(Src) 97.9 F (36.6 C) (Oral)  Resp 14  Ht 5\' 5"  (1.651 m)  SpO2 98%  Physical Exam  Nursing note and vitals reviewed. Constitutional: She is oriented to person, place, and time. She appears well-developed and well-nourished. No distress.  HENT:  Head: Normocephalic and atraumatic.  Mouth/Throat: No oropharyngeal exudate.  Eyes: Conjunctivae are normal. Pupils are equal, round, and reactive to light.  Neck: Normal range of motion. Neck supple.  Cardiovascular: Normal rate and regular rhythm.  Exam reveals no gallop and no friction rub.   No murmur heard. Pulmonary/Chest: Effort normal and breath sounds normal. She exhibits tenderness and bony tenderness.    Abdominal: She exhibits no distension. There is no tenderness.  Musculoskeletal: Normal range of motion. She exhibits edema (+1 LE edema). She exhibits no tenderness.  Neurological: She is alert and oriented to  person, place, and time.  Skin: Skin is warm and dry.  Psychiatric: She has a normal mood and affect.    ED Course  Procedures (including critical care time)  Labs Reviewed  CBC - Abnormal; Notable for the following:    Platelets 125 (*)    All other components within normal limits  PRO B NATRIURETIC PEPTIDE - Abnormal; Notable for the following:    Pro B Natriuretic peptide (BNP) 506.9 (*)    All other components within normal limits  PROTIME-INR - Abnormal; Notable for the following:    Prothrombin Time 18.0 (*)    INR 1.54 (*)    All other components within normal limits  GLUCOSE, CAPILLARY  POCT I-STAT TROPONIN I   Dg Chest 2 View  06/11/2012  *RADIOLOGY REPORT*  Clinical Data: Mid to left-sided chest pain.  CHEST - 2 VIEW  Comparison: One-view chest 06/15/2010.  Findings: Heart size is normal. Pacing wires are stable. Emphysematous changes are present.  No focal airspace disease is evident.  The visualized soft tissues and bony thorax are unremarkable.  IMPRESSION:  1.  Emphysema. 2.  No acute cardiopulmonary disease.   Original Report Authenticated By: Marin Roberts, M.D.     1. Chest pain      Date: 06/11/2012  Rate: 73  Rhythm: normal sinus rhythm  QRS Axis: normal  Intervals: normal  ST/T Wave abnormalities: normal  Conduction Disutrbances:none  Narrative Interpretation:   Old EKG Reviewed: unchanged  MDM   79 y/o female who presents with cc of chest pain. Afebrile. Well appearing. No acute ischemic changes. Initial troponin negative. BNP mildly elevated. CXR not c/w dissection, pneumonia, or pneumothorax. Doubt PE given no risk factors, coumadin use and symptoms atypical for PE. Recent negative cath in 2012. Pain reproducible and possibly related to MSK. The patient states she feels like her pacemaker is malfunctioning. The pacemaker was interrogated and revealed low battery. Cardiology consulted and admitted to observation.        Shanon Ace,  MD 06/11/12 [phone]

## 2012-06-11 NOTE — ED Notes (Signed)
Report attempted x 1

## 2012-06-11 NOTE — H&P (Signed)
Cardiology History and Physical  Allean Found, MD  History of Present Illness (and review of medical records): Katrina Rodriguez is a 79 y.o. female who presents for evaluation of chest pain.  She has had intermittent pressure like chest pain over past few days.  Pain is not related to activity and occurs at rest.  Pain last few minutes.  She had a bout that awoke her from sleep night prior.  Pain was 8/10 midsternal with radiation to left side.  She had a more severe episode today after moving trash can that was 10/10 and lasted around 2hrs.  She felt mildly short of breath, but had no other associated symptoms.  She now has dull ache around 4/10.    Review of Systems She denies any recent fevers, chills, night sweats.  She denies any recent long travel, surgery or injury.  Further review of systems was otherwise negative other than stated in HPI.  Patient Active Problem List   Diagnosis Date Noted  . H/O: CVA (cardiovascular accident)   . PAROXYSMAL ATRIAL FIBRILLATION 02/02/2010  . 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 DEEP VENOUS THROMBOPHLEBITIS 09/17/2008  . HYPOTENSION, ORTHOSTATIC 09/17/2008  . SYNCOPE 09/17/2008  . CHEST PAIN 09/17/2008  . PPM-Medtronic 09/17/2008   Past Medical History  Diagnosis Date  . Atrial fibrillation   . Thrombocytopenia   . H/O: CVA (cardiovascular accident)     right internal capsule  . Orthostasis   . Anticoagulant long-term use   . Pacemaker   . Tachycardia-bradycardia syndrome   . Mild pulmonary hypertension   . Chronic anemia   . Syncope, cardiogenic   . Chest pain     Has normal coronaries per cath in 2005; negative nuclear in 2010  . Palpitation   . Hyperthyroidism   . Retinal detachment   . High risk medication use     Tikosyn & Coumadin  . Stroke     tia  . Anginal pain   . Shortness of breath   . Arthritis     Past Surgical History  Procedure Laterality Date  . Cardiac pacemaker placement    . Cardiac  catheterization    . Retinal detachment surgery    . Afib ablation      by Dr Johney Frame    Prescriptions prior to admission  Medication Sig Dispense Refill  . atenolol (TENORMIN) 50 MG tablet Take 1 tablet (50 mg total) by mouth daily.  90 tablet  3  . dofetilide (TIKOSYN) 250 MCG capsule Take 1 capsule (250 mcg total) by mouth 2 (two) times daily.  180 capsule  2  . fludrocortisone (FLORINEF) 0.1 MG tablet Take 2 tablets (0.2 mg) twice a day  360 tablet  3  . potassium chloride SA (K-DUR,KLOR-CON) 20 MEQ tablet Take 40 mEq by mouth daily.      Marland Kitchen warfarin (COUMADIN) 4 MG tablet Take 4 mg by mouth every evening.       Allergies  Allergen Reactions  . Amiodarone   . Amoxicillin   . Codeine   . Darvocet (Propoxyphene-Acetaminophen)   . Demerol   . Penicillins     History  Substance Use Topics  . Smoking status: Never Smoker   . Smokeless tobacco: Never Used  . Alcohol Use: No    Family History  Problem Relation Age of Onset  . Heart disease Father   . Heart disease Brother   . Heart disease Brother      Objective: Patient Vitals for the past 8  hrs:  BP Temp Temp src Pulse Resp SpO2 Height Weight  06/12/12 0021 95/52 mmHg 97.6 F (36.4 C) Oral 67 - 98 % - -  06/11/12 2330 111/87 mmHg - - 65 14 98 % 5\' 5"  (1.651 m) 55.203 kg (121 lb 11.2 oz)  06/11/12 2318 110/92 mmHg - Oral 65 25 99 % - -  06/11/12 2300 102/62 mmHg - - 67 16 100 % - -  06/11/12 2230 109/79 mmHg - - 64 20 99 % - -  06/11/12 2221 92/75 mmHg - - 65 16 98 % - -  06/11/12 2148 - - - 57 18 99 % - -  06/11/12 2146 101/58 mmHg - - - - - - -  06/11/12 1945 151/68 mmHg - - 70 19 100 % - -  06/11/12 1930 141/72 mmHg - - 56 19 100 % - -  06/11/12 1915 132/71 mmHg 97.9 F (36.6 C) Oral 87 18 96 % 5\' 5"  (1.651 m) -   General Appearance:    Alert, cooperative, no distress, appears stated age  Head:    Normocephalic, without obvious abnormality, atraumatic  Eyes:     PERRL, EOMI, anicteric sclerae  Neck:   Supple, no  carotid bruit or JVD  Lungs:     Clear to auscultation bilaterally, respirations unlabored  Heart:    Regular rate and rhythm, S1 and S2 normal, no murmur  Abdomen:     Soft, non-tender, normoactive bowel sounds  Extremities:   Extremities normal, atraumatic, mild edema  Pulses:   2+ and symmetric all extremities  Skin:   no rashes or lesions  Neurologic:   No focal deficits. AAO x3   Results for orders placed during the hospital encounter of 06/11/12 (from the past 48 hour(s))  CBC     Status: Abnormal   Collection Time    06/11/12  7:18 PM      Result Value Range   WBC 5.9  4.0 - 10.5 K/uL   RBC 4.26  3.87 - 5.11 MIL/uL   Hemoglobin 13.7  12.0 - 15.0 g/dL   HCT 16.1  09.6 - 04.5 %   MCV 91.8  78.0 - 100.0 fL   MCH 32.2  26.0 - 34.0 pg   MCHC 35.0  30.0 - 36.0 g/dL   RDW 40.9  81.1 - 91.4 %   Platelets 125 (*) 150 - 400 K/uL  PRO B NATRIURETIC PEPTIDE     Status: Abnormal   Collection Time    06/11/12  7:18 PM      Result Value Range   Pro B Natriuretic peptide (BNP) 506.9 (*) 0 - 450 pg/mL  PROTIME-INR     Status: Abnormal   Collection Time    06/11/12  7:18 PM      Result Value Range   Prothrombin Time 18.0 (*) 11.6 - 15.2 seconds   INR 1.54 (*) 0.00 - 1.49  GLUCOSE, CAPILLARY     Status: None   Collection Time    06/11/12  7:47 PM      Result Value Range   Glucose-Capillary 89  70 - 99 mg/dL  POCT I-STAT TROPONIN I     Status: None   Collection Time    06/11/12  8:01 PM      Result Value Range   Troponin i, poc 0.00  0.00 - 0.08 ng/mL   Comment 3            Comment: Due to the release kinetics of  cTnI,     a negative result within the first hours     of the onset of symptoms does not rule out     myocardial infarction with certainty.     If myocardial infarction is still suspected,     repeat the test at appropriate intervals.  TROPONIN I     Status: None   Collection Time    06/12/12 12:36 AM      Result Value Range   Troponin I <0.30  <0.30 ng/mL    Comment:            Due to the release kinetics of cTnI,     a negative result within the first hours     of the onset of symptoms does not rule out     myocardial infarction with certainty.     If myocardial infarction is still suspected,     repeat the test at appropriate intervals.   Dg Chest 2 View  06/11/2012  *RADIOLOGY REPORT*  Clinical Data: Mid to left-sided chest pain.  CHEST - 2 VIEW  Comparison: One-view chest 06/15/2010.  Findings: Heart size is normal. Pacing wires are stable. Emphysematous changes are present.  No focal airspace disease is evident.  The visualized soft tissues and bony thorax are unremarkable.  IMPRESSION:  1.  Emphysema. 2.  No acute cardiopulmonary disease.   Original Report Authenticated By: Marin Roberts, M.D.     ECG:  Sinus rhythm HR 73 no acute ischemic changes  Data Review PPM interrogation Medtronic Dual chamber PPM VVI 65 At ERI, no other events noted.  Assessment: 41F hx of Paroxymal Afib on Tikosyn and coumadin, Tachy-brady s/p PPM now presents with intermittent chest pain.  She has had prior normal Cath and stress tests.  No acute ischemia on ecg and initial trop negative. Also consider PE in differential however no other supporting factors other than sub therapeutic INR.  Pain could be GI vs MSK.   Plan:  1. Place on observation, 2. Continuous monitoring on Telemetry. 3. Repeat ekg on admit, prn chest pain or arrythmia 4. Trend cardiac biomarkers, check lipids, hgba1c, tsh 5. Medical management to include ASA,BB, NTG prn 6. Continue Tikosyn, Coumadin 7. Will need to discuss/plan for  battery replacement given device at [facility]

## 2012-06-12 ENCOUNTER — Encounter (HOSPITAL_COMMUNITY)
Admission: EM | Disposition: A | Discharge status: Home / Self Care | Payer: Self-pay | Source: Home / Self Care | Attending: Emergency Medicine

## 2012-06-12 ENCOUNTER — Encounter (HOSPITAL_COMMUNITY): Payer: Self-pay

## 2012-06-12 ENCOUNTER — Observation Stay (HOSPITAL_COMMUNITY): Payer: Medicare Other

## 2012-06-12 DIAGNOSIS — R079 Chest pain, unspecified: Secondary | ICD-10-CM

## 2012-06-12 DIAGNOSIS — J9819 Other pulmonary collapse: Secondary | ICD-10-CM | POA: Diagnosis not present

## 2012-06-12 DIAGNOSIS — I495 Sick sinus syndrome: Secondary | ICD-10-CM

## 2012-06-12 HISTORY — PX: PERMANENT PACEMAKER GENERATOR CHANGE: SHX6022

## 2012-06-12 LAB — BASIC METABOLIC PANEL
Chloride: 109 mEq/L (ref 96–112)
GFR calc Af Amer: 57 mL/min — ABNORMAL LOW (ref >90)
GFR calc non Af Amer: 50 mL/min — ABNORMAL LOW (ref >90)
Potassium: 3.7 mEq/L (ref 3.5–5.1)
Sodium: 142 mEq/L (ref 135–145)

## 2012-06-12 LAB — SURGICAL PCR SCREEN: Staphylococcus aureus: NEGATIVE

## 2012-06-12 LAB — MAGNESIUM: Magnesium: 2.5 mg/dL (ref 1.5–2.5)

## 2012-06-12 LAB — CBC
HCT: 37 % (ref 36.0–46.0)
MCHC: 34.1 g/dL (ref 30.0–36.0)
Platelets: 109 10*3/uL — ABNORMAL LOW (ref 150–400)
RDW: 13.6 % (ref 11.5–15.5)
WBC: 4.5 10*3/uL (ref 4.0–10.5)

## 2012-06-12 LAB — HEMOGLOBIN A1C
Hgb A1c MFr Bld: 5.8 % — ABNORMAL HIGH (ref <5.7)
Mean Plasma Glucose: 120 mg/dL — ABNORMAL HIGH (ref <117)

## 2012-06-12 LAB — LIPID PANEL
Cholesterol: 183 mg/dL (ref 0–200)
Triglycerides: 103 mg/dL (ref <150)

## 2012-06-12 LAB — PROTIME-INR: Prothrombin Time: 18.2 seconds — ABNORMAL HIGH (ref 11.6–15.2)

## 2012-06-12 LAB — TROPONIN I: Troponin I: <0.3 ng/mL (ref <0.30)

## 2012-06-12 SURGERY — PERMANENT PACEMAKER GENERATOR CHANGE
Anesthesia: LOCAL

## 2012-06-12 MED ORDER — ASPIRIN EC 81 MG PO TBEC
81.0000 mg | DELAYED_RELEASE_TABLET | Freq: Every day | ORAL | Status: DC
  Administered 2012-06-12 – 2012-06-13 (×2): 81 mg via ORAL
  Filled 2012-06-12 (×2): qty 1

## 2012-06-12 MED ORDER — CHLORHEXIDINE GLUCONATE 4 % EX LIQD
60.0000 mL | Freq: Once | CUTANEOUS | Status: DC
  Filled 2012-06-12: qty 60

## 2012-06-12 MED ORDER — ONDANSETRON HCL 4 MG/2ML IJ SOLN: 4.0000 mg | Freq: Four times a day (QID) | INTRAMUSCULAR | Status: DC | PRN

## 2012-06-12 MED ORDER — SODIUM CHLORIDE 0.9 % IR SOLN
80.0000 mg | Status: DC
  Filled 2012-06-12: qty 2

## 2012-06-12 MED ORDER — SODIUM CHLORIDE 0.9 % IV SOLN: INTRAVENOUS | Status: DC

## 2012-06-12 MED ORDER — LIDOCAINE HCL (PF) 1 % IJ SOLN
INTRAMUSCULAR | Status: AC
  Filled 2012-06-12: qty 60

## 2012-06-12 MED ORDER — IOHEXOL 350 MG/ML SOLN
80.0000 mL | Freq: Once | INTRAVENOUS | Status: AC | PRN
  Administered 2012-06-12: 100 mL via INTRAVENOUS

## 2012-06-12 MED ORDER — ATENOLOL 50 MG PO TABS
50.0000 mg | ORAL_TABLET | Freq: Every day | ORAL | Status: DC
  Administered 2012-06-13: 50 mg via ORAL
  Filled 2012-06-12 (×2): qty 1

## 2012-06-12 MED ORDER — VANCOMYCIN HCL IN DEXTROSE 1-5 GM/200ML-% IV SOLN
1000.0000 mg | INTRAVENOUS | Status: DC
  Filled 2012-06-12: qty 200

## 2012-06-12 MED ORDER — ACETAMINOPHEN 325 MG PO TABS
650.0000 mg | ORAL_TABLET | ORAL | Status: DC | PRN
  Administered 2012-06-13: 650 mg via ORAL
  Filled 2012-06-12: qty 2

## 2012-06-12 MED ORDER — MUPIROCIN 2 % EX OINT
TOPICAL_OINTMENT | Freq: Once | CUTANEOUS | Status: DC
  Filled 2012-06-12: qty 22

## 2012-06-12 MED ORDER — DOFETILIDE 250 MCG PO CAPS
250.0000 ug | ORAL_CAPSULE | Freq: Two times a day (BID) | ORAL | Status: DC
  Administered 2012-06-12 – 2012-06-13 (×3): 250 ug via ORAL
  Filled 2012-06-12 (×5): qty 1

## 2012-06-12 MED ORDER — FLUDROCORTISONE ACETATE 0.1 MG PO TABS
0.2000 mg | ORAL_TABLET | Freq: Two times a day (BID) | ORAL | Status: DC
  Administered 2012-06-12 – 2012-06-13 (×2): 0.2 mg via ORAL
  Filled 2012-06-12 (×4): qty 2

## 2012-06-12 MED ORDER — MIDAZOLAM HCL 5 MG/5ML IJ SOLN
INTRAMUSCULAR | Status: AC
  Filled 2012-06-12: qty 5

## 2012-06-12 MED ORDER — WARFARIN SODIUM 4 MG PO TABS
4.0000 mg | ORAL_TABLET | Freq: Every evening | ORAL | Status: DC
  Administered 2012-06-12 (×2): 4 mg via ORAL
  Filled 2012-06-12 (×3): qty 1

## 2012-06-12 MED ORDER — ATENOLOL 50 MG PO TABS
50.0000 mg | ORAL_TABLET | Freq: Every day | ORAL | Status: DC
  Filled 2012-06-12: qty 1

## 2012-06-12 MED ORDER — SODIUM CHLORIDE 0.9 % IV SOLN
INTRAVENOUS | Status: DC
  Administered 2012-06-12: 11:00:00 via INTRAVENOUS

## 2012-06-12 MED ORDER — WARFARIN - PHYSICIAN DOSING INPATIENT
Freq: Every day | Status: DC
  Administered 2012-06-12: 18:00:00

## 2012-06-12 MED ORDER — NITROGLYCERIN 0.4 MG SL SUBL: 0.4000 mg | SUBLINGUAL_TABLET | SUBLINGUAL | Status: DC | PRN

## 2012-06-12 NOTE — Progress Notes (Signed)
Blood pressure manually 82/50 heart rate 66. Notified on-call MD of blood pressure and the fact patient has scheduled Atenolol. Orders given to hold atenolol and continue to monitor. Atenolol held. Will continue to monitor.

## 2012-06-12 NOTE — Op Note (Signed)
PPM generator removal and insertion of a new PPM without immediate complication. Y#[id number].

## 2012-06-12 NOTE — Progress Notes (Signed)
Utilization Review Completed.   Jordane Hisle, RN, BSN Nurse Case Manager  [phone]  

## 2012-06-12 NOTE — Progress Notes (Signed)
   TELEMETRY: Reviewed telemetry pt in NSR with intermittent pacing: Filed Vitals:   06/11/12 2318 06/11/12 2330 06/12/12 0021 06/12/12 0409  BP: 110/92 111/87 95/52 79/50   Pulse: 65 65 67 65  Temp:   97.6 F (36.4 C) 98.4 F (36.9 C)  TempSrc: Oral  Oral Oral  Resp: 25 14  18   Height:  5\' 5"  (1.651 m)    Weight:  121 lb 11.2 oz (55.203 kg)  121 lb 11.1 oz (55.2 kg)  SpO2: 99% 98% 98% 96%    Intake/Output Summary (Last 24 hours) at 06/12/12 0856 Last data filed at 06/12/12 0848  Gross per 24 hour  Intake      0 ml  Output    100 ml  Net   -100 ml    SUBJECTIVE Chest pain resolved this am. No SOB.   LABS: Basic Metabolic Panel:  Recent Labs  16/10/96 0628  NA 142  K 3.7  CL 109  CO2 25  GLUCOSE 93  BUN 18  CREATININE 1.03  CALCIUM 8.2*  MG 2.5   CBC:  Recent Labs  06/11/12 1918 06/12/12 0628  WBC 5.9 4.5  HGB 13.7 12.6  HCT 39.1 37.0  MCV 91.8 92.3  PLT 125* PENDING   Cardiac Enzymes:  Recent Labs  06/12/12 0036 06/12/12 0628  TROPONINI <0.30 <0.30   Fasting Lipid Panel:  Recent Labs  06/12/12 0628  CHOL 183  HDL 49  LDLCALC 113*  TRIG 103  CHOLHDL 3.7   Radiology/Studies:  Dg Chest 2 View  06/11/2012  *RADIOLOGY REPORT*  Clinical Data: Mid to left-sided chest pain.  CHEST - 2 VIEW  Comparison: One-view chest 06/15/2010.  Findings: Heart size is normal. Pacing wires are stable. Emphysematous changes are present.  No focal airspace disease is evident.  The visualized soft tissues and bony thorax are unremarkable.  IMPRESSION:  1.  Emphysema. 2.  No acute cardiopulmonary disease.   Original Report Authenticated By: Marin Roberts, M.D.     PHYSICAL EXAM General: Thin, elderly, in no acute distress. Head: Normal Neck: Negative for carotid bruits. JVD not elevated. Lungs: Clear bilaterally to auscultation without wheezes, rales, or rhonchi. Breathing is unlabored. Heart: RRR S1 S2 without murmurs, rubs, or gallops.  Abdomen: Soft,  non-tender, non-distended with normoactive bowel sounds. No hepatomegaly. No rebound/guarding. No obvious abdominal masses. Msk:  Strength and tone appears normal for age. Extremities: No clubbing, cyanosis or edema.  Distal pedal pulses are 2+ and equal bilaterally. Neuro: Alert and oriented X 3. Moves all extremities spontaneously. Psych:  Responds to questions appropriately with a normal affect.  ASSESSMENT AND PLAN: 1. Acute chest pain. Mrs. Goodspeed has a long history of atypical chest pain. Normal cardiac cath in 2005. Normal myoview 2/13. Normal Echo 6/13. Doubt cardiac etiology. Will get chest CT to rule out PE and evaluate for other noncardiac causes. Cycle enzymes.   2. Atrial fibrillation. NSR on Tikosyn. Coumadin subtherapeutic.  3. S/p pacemaker. Now at [facility]. Discussed with Dr. Ladona Ridgel. Plan generator replacement today since INR only 1.5.   4. Hypotension. Patient has history of orthostatic hypotension. On chronic florinef. Will observe. Gently hydrate.  Principal Problem:   CHEST PAIN Active Problems:   PAROXYSMAL ATRIAL FIBRILLATION   BRADYCARDIA-TACHYCARDIA SYNDROME   HYPOTENSION, ORTHOSTATIC    Leonides Schanz Peter [place] MD,FACC 06/12/2012 8:56 AM

## 2012-06-12 NOTE — Progress Notes (Signed)
Patient evaluated for community based chronic disease management services with [facility] Care Management Program as a benefit of patient's [facility] Medicare Insurance. Patient will receive a post discharge transition of care call and will be evaluated for monthly home visits for assessments and disease process education. Spoke with patient at bedside to explain [facility] Care Management services.  Written consents obtained. Address, PCP, and contact number verified.  Patient admits to multiple falls at home secondary to hypotension and possibly vision impairment.  She lives alone and receives medications by mail.  Left contact information and [facility] literature at bedside.  Suggest a low vision referral to [facility] for safety and low vision education at home.  Also patient may benefit from transportation assistance for sick visits.  Currently her transportation needs are met by church members that are not always available.  She is on Tikosyn but indicates that she can afford it.  Made inpatient Case Manager Ivonne Andrew aware that [facility] Care Management following. Of note, [facility] Care Management services does not replace or interfere with any services that are arranged by inpatient case management or social work.  For additional questions or referrals please contact Anibal Henderson BSN RN [facility] Liaison at [phone].

## 2012-06-12 NOTE — Consult Note (Signed)
Reason for Consult:chest pain associated dysynchronous ventricular pacing  Referring Physician: [place]  Katrina Rodriguez is an 79 y.o. female.   HPI: The patient is a pleasant 79 yo woman with a h/o bradycardia and atrial fib, s/p PPM insertion. She was admitted with chest pain associated with sob. She denies edema. No syncope. She ruled out for MI. She was found to be pacing in the VVI mode and interogation of her PPM has demonstrated that she has CHB with underlying VVI pacing due to her device being at [facility]. She is referred to consider PPM generator change.   PMH: Past Medical History  Diagnosis Date  . Atrial fibrillation   . Thrombocytopenia   . H/O: CVA (cardiovascular accident)     right internal capsule  . Orthostasis   . Anticoagulant long-term use   . Pacemaker   . Tachycardia-bradycardia syndrome   . Mild pulmonary hypertension   . Chronic anemia   . Syncope, cardiogenic   . Chest pain     Has normal coronaries per cath in 2005; negative nuclear in 2010  . Palpitation   . Hyperthyroidism   . Retinal detachment   . High risk medication use     Tikosyn & Coumadin  . Stroke     tia  . Anginal pain   . Shortness of breath   . Arthritis     PSHX: Past Surgical History  Procedure Laterality Date  . Cardiac pacemaker placement    . Cardiac catheterization    . Retinal detachment surgery    . Afib ablation      by Dr Johney Frame    [facility]: Family History  Problem Relation Age of Onset  . Heart disease Father   . Heart disease Brother   . Heart disease Brother     Social History:  reports that she has never smoked. She has never used smokeless tobacco. She reports that she does not drink alcohol or use illicit drugs.  Allergies:  Allergies  Allergen Reactions  . Amiodarone   . Amoxicillin   . Codeine   . Darvocet (Propoxyphene-Acetaminophen)   . Demerol   . Penicillins     Medications: reviewed  Dg Chest 2 View  06/11/2012  *RADIOLOGY REPORT*  Clinical  Data: Mid to left-sided chest pain.  CHEST - 2 VIEW  Comparison: One-view chest 06/15/2010.  Findings: Heart size is normal. Pacing wires are stable. Emphysematous changes are present.  No focal airspace disease is evident.  The visualized soft tissues and bony thorax are unremarkable.  IMPRESSION:  1.  Emphysema. 2.  No acute cardiopulmonary disease.   Original Report Authenticated By: Marin Roberts, M.D.     ROS  As stated in the HPI and negative for all other systems.  Physical Exam  Vitals:Blood pressure 79/50, pulse 65, temperature 98.4 F (36.9 C), temperature source Oral, resp. rate 18, height 5\' 5"  (1.651 m), weight 121 lb 11.1 oz (55.2 kg), SpO2 96.00%.  Well appearing elderly woman, NAD HEENT: Unremarkable Neck:  7 cm JVD, no thyromegally Lymphatics:  No adenopathy Back:  No CVA tenderness Lungs:  Clear with no wheezes, rales, or rhonchi HEART:  Regular rate rhythm, no murmurs, no rubs, no clicks Abd:  soft, positive bowel sounds, no organomegally, no rebound, no guarding Ext:  2 plus pulses, no edema, no cyanosis, no clubbing Skin:  No rashes no nodules Neuro:  CN II through XII intact, motor grossly intact  Assessment/Plan: 1. Chest pain 2. PPM at ERI with  device dysynchronous pacing in the VVI mode at 65/min. 3. PAF Rec: I have discussed the risks/benefits/goals/expectations of PPM generator removal and insertion of a new device and she wishes to proceed.  [name] 06/12/2012, 10:15 AM

## 2012-06-13 DIAGNOSIS — I951 Orthostatic hypotension: Secondary | ICD-10-CM

## 2012-06-13 DIAGNOSIS — R079 Chest pain, unspecified: Secondary | ICD-10-CM | POA: Diagnosis not present

## 2012-06-13 DIAGNOSIS — I4891 Unspecified atrial fibrillation: Secondary | ICD-10-CM | POA: Diagnosis not present

## 2012-06-13 NOTE — Progress Notes (Signed)
DC IV, DC Tele, DC Home. Discharge instructions and home medications discussed with patient. Patient and family denied any questions or concerns at this time. Patient leaving unit via wheelchair and appears in no acute distress.

## 2012-06-13 NOTE — Progress Notes (Signed)
06/13/12 1500 In to speak with pt. about home health services.  Offered list of agencies and pt. chose [facility].  TC to Corrie Dandy, with Genevieve Norlander to give referral for [facility] RN, [facility] PT/OT, and [facility] CSW.  Pt. to dc home today. Tera Mater, RN, BSN NCM [phone]

## 2012-06-13 NOTE — Op Note (Signed)
NAMELAURY, Katrina Rodriguez NO.:  [id number]  MEDICAL RECORD NO.:  [id number]  LOCATION:  4743                         FACILITY:  [facility]  PHYSICIAN:  Doylene Canning. Ladona Ridgel, MD    DATE OF BIRTH:  1928/10/08  DATE OF PROCEDURE:  06/12/2012 DATE OF DISCHARGE:                              OPERATIVE REPORT   PROCEDURE PERFORMED:  Removal of previously implanted dual-chamber pacemaker, which had reached elective replacement and was pacing VVI at 65 and insertion of a new dual-chamber pacemaker.  INTRODUCTION:  The patient is an 79 year old woman with paroxysmal atrial fibrillation and symptomatic bradycardia, status post pacemaker insertion in 2004.  She has reached elective replacement on her device and is now referred for removal of her old device and insertion of a new one.  PROCEDURE:  After informed consent was obtained, the patient was taken to the diagnostic EP lab in a fasting state.  After usual preparation and draping, intravenous fentanyl and midazolam was given for sedation. A 30 mL of lidocaine was infiltrated into the left infraclavicular region.  A 5-cm incision was carried out over this region and electrocautery was utilized to dissect down to the pacemaker pocket. The generator was removed with gentle traction.  The leads were disconnected from the pacing lead and evaluated and found to be working satisfactorily.  The P-waves were 2, the R-waves were 17, the impedance was 500 in the atrium and 500 in the ventricle.  Threshold of 0.4 at 0.5 milliseconds in the atrium and 0.3 at 0.5 milliseconds in the right ventricle.  With these satisfactory parameters, the new Medtronic Sensia dual-chamber pacemaker serial #[id number] was connected to the atrial and ventricular pacing lead and placed back in the subcutaneous pocket. The pocket was irrigated with antibiotic irrigation, and the incision was closed with 2-0 and 3-0 Vicryl.  Benzoin and Steri-Strips were painted  on the skin.  A pressure dressing was applied and the patient was returned to her room in satisfactory condition.  COMPLICATIONS:  There were no immediate procedure complications.  RESULTS:  Demonstrate successful removal of a previously implanted dual- chamber pacemaker, and insertion of a new dual-chamber device without immediate procedure complication.     Doylene Canning. Ladona Ridgel, MD     GWT/MEDQ  D:  06/12/2012  T:  06/13/2012  Job:  [id number]  cc:   [name], M.D.

## 2012-06-13 NOTE — Discharge Summary (Signed)
ELECTROPHYSIOLOGY PROCEDURE DISCHARGE SUMMARY    Patient ID: EULALA NEWCOMBE,  MRN: [id number], DOB/AGE: 10-03-1928 79 y.o.  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  Paroxysmal atrial fibrillation- on Tikosyn and Coumadin. S/p afib ablation  2.  Orthostatic hypotension- on Florinef 3.  Chest pain- related to ventricular pacing (normal cath in 2005, negative nuclear study in 2010) 4.  CVA   Procedures This Admission:  1.  Pacemaker generator change on 06-12-2012 by Dr Ladona Ridgel.  The patient's previously implanted Medtronic pacemaker was removed and replaced with a MDT [facility] pacemaker.  There were no early apparent complications.  2.  CTA of chest on 06-12-2012 demonstrated no PE 3.  CXR on 06-11-2012 demonstrated emphysema but no acute disease.  Brief HPI/Hospital Course: Mrs. Hackmann is a 79 year old female with a past medical history of tachy brady syndrome s/p PPM implant, atrial fibrillation s/p ablation, orthostatic hypotension and CVA.  Her pacemaker has been nearing ERI and she has been on accelerated follow up.  She has had chest pain in the past with ventricular pacing.  On the day of admission, she developed chest pain and presented to the ER for evaluation.  She was found to be at [facility].  It was felt that her symptoms were likely related to loss of AV synchrony with device reversion to VVI pacing.  Pacemaker generator change was recommended and carried out by Dr Ladona Ridgel with details as outlined above. She was monitored on telemetry overnight which demonstrated atrial pacing with intrinsic ventricular conduction.  Her left chest was without hematoma or ecchymosis.  Drs [place] and Micala Saltsman examined the  patient and considered her stable for discharge to home.  Her device was programmed ADI like her previous device because of her intolerance to ventricular pacing and intact AV conduction.   Discharge Vitals: Blood pressure 121/60, pulse 69, temperature 98.7 F (37.1 C), temperature source Oral, resp. rate 18, height 5\' 5"  (1.651 m), weight 121 lb 11.2 oz (55.203 kg), SpO2 97.00%.    Labs:   Lab Results  Component Value Date   WBC 4.5 06/12/2012   HGB 12.6 06/12/2012   HCT 37.0 06/12/2012   MCV 92.3 06/12/2012   PLT 109* 06/12/2012    Recent Labs Lab 06/12/12 0628  NA 142  K 3.7  CL 109  CO2 25  BUN 18  CREATININE 1.03  CALCIUM 8.2*  GLUCOSE 93     Discharge Medications:    Medication List    TAKE these medications       atenolol 50 MG tablet  Commonly known as:  TENORMIN  Take 1 tablet (50 mg total) by mouth daily.     dofetilide 250 MCG capsule  Commonly known as:  TIKOSYN  Take 1 capsule (250 mcg total) by mouth 2 (two) times daily.     fludrocortisone 0.1 MG tablet  Commonly known as:  FLORINEF  Take 2 tablets (0.2 mg) twice a day     potassium chloride SA 20 MEQ tablet  Commonly known as:  K-DUR,KLOR-CON  Take 40 mEq by mouth daily.     warfarin 4 MG tablet  Commonly known as:  COUMADIN  Take 4 mg by mouth every evening.        Disposition:  Future Appointments Provider Department Dept Phone   07/01/2012 9:15 AM [facility] [phone]   07/01/2012 9:30 AM [facility] [employer] Main Office [place]) [phone]   07/25/2012 11:30 AM Peter M [place], MD [facility] Main Office [place]) [phone]     Follow-up Information   Follow up with [facility] On 07/01/2012. (at 9:30)       Duration of Discharge Encounter: Greater than 30 minutes including physician time.  Signed, Hillis Range, MD

## 2012-06-13 NOTE — Progress Notes (Signed)
   TELEMETRY: Reviewed telemetry pt in atrial paced rhythm  Filed Vitals:   06/12/12 2100 06/12/12 2156 06/13/12 0131 06/13/12 0615  BP: 116/50 101/53 98/49 106/55  Pulse: 64 69 60 60  Temp:  98.2 F (36.8 C) 98.9 F (37.2 C) 98.3 F (36.8 C)  TempSrc:  Oral Oral Oral  Resp:  18 16 16   Height:      Weight:    121 lb 11.2 oz (55.203 kg)  SpO2:  95% 97% 95%    Intake/Output Summary (Last 24 hours) at 06/13/12 0830 Last data filed at 06/12/12 2100  Gross per 24 hour  Intake    100 ml  Output      0 ml  Net    100 ml    SUBJECTIVE Chest pain resolved this am. No SOB. Feels well.  LABS: Basic Metabolic Panel:  Recent Labs  16/10/96 0628  NA 142  K 3.7  CL 109  CO2 25  GLUCOSE 93  BUN 18  CREATININE 1.03  CALCIUM 8.2*  MG 2.5   CBC:  Recent Labs  06/11/12 1918 06/12/12 0628  WBC 5.9 4.5  HGB 13.7 12.6  HCT 39.1 37.0  MCV 91.8 92.3  PLT 125* 109*   Cardiac Enzymes:  Recent Labs  06/12/12 0036 06/12/12 0628  TROPONINI <0.30 <0.30   Fasting Lipid Panel:  Recent Labs  06/12/12 0628  CHOL 183  HDL 49  LDLCALC 113*  TRIG 103  CHOLHDL 3.7   Radiology/Studies:  Dg Chest 2 View  06/11/2012  *RADIOLOGY REPORT*  Clinical Data: Mid to left-sided chest pain.  CHEST - 2 VIEW  Comparison: One-view chest 06/15/2010.  Findings: Heart size is normal. Pacing wires are stable. Emphysematous changes are present.  No focal airspace disease is evident.  The visualized soft tissues and bony thorax are unremarkable.  IMPRESSION:  1.  Emphysema. 2.  No acute cardiopulmonary disease.   Original Report Authenticated By: Marin Roberts, M.D.    Ecg: atrial paced. Nonspecific TWA, prolonged QT  PHYSICAL EXAM General: Thin, elderly, in no acute distress. Head: Normal Neck: Negative for carotid bruits. JVD not elevated. Lungs: Clear bilaterally to auscultation without wheezes, rales, or rhonchi. Breathing is unlabored. Heart: RRR S1 S2 without murmurs, rubs, or  gallops. Paced site looks OK. Abdomen: Soft, non-tender, non-distended with normoactive bowel sounds. No hepatomegaly. No rebound/guarding. No obvious abdominal masses. Msk:  Strength and tone appears normal for age. Extremities: No clubbing, cyanosis or edema.  Distal pedal pulses are 2+ and equal bilaterally. Neuro: Alert and oriented X 3. Moves all extremities spontaneously. Psych:  Responds to questions appropriately with a normal affect.  ASSESSMENT AND PLAN: 1. Acute chest pain. Mrs. Sassi has a long history of atypical chest pain. Normal cardiac cath in 2005. Normal myoview 2/13. Normal Echo 6/13. CT chest normal. I think her chest pain was related to her pacemaker switching to dyssynchronous mode.   2. Atrial fibrillation. NSR on Tikosyn. Coumadin subtherapeutic.  3. S/p pacemaker. S/p generator replacement for ERI. Stable for discharge today. Needs follow up with Dr. Lubertha Basque staff in approx 10 days for a wound check.  4. Hypotension. Patient has history of orthostatic hypotension. On chronic florinef. Drop in BP also related to asynchronous VVI pacing. Improved.   Principal Problem:   CHEST PAIN Active Problems:   PAROXYSMAL ATRIAL FIBRILLATION   BRADYCARDIA-TACHYCARDIA SYNDROME   HYPOTENSION, ORTHOSTATIC    Signed, Peter [place] MD,FACC 06/13/2012 8:30 AM

## 2012-06-17 ENCOUNTER — Telehealth: Payer: Self-pay

## 2012-06-17 ENCOUNTER — Telehealth: Payer: Self-pay | Admitting: Nurse Practitioner

## 2012-06-17 NOTE — Telephone Encounter (Signed)
TCM call to patient she stated she is doing good.Patient understands discharge instructions and medications.Patient is aware of appointments for INR 07/01/12 and 07/25/12 with Dr.Jordan.

## 2012-06-17 NOTE — Telephone Encounter (Signed)
TCM call to patient phone rings busy.

## 2012-06-17 NOTE — Telephone Encounter (Signed)
Pt called this evening stating she had a "really hard chest pain" that nearly brought her to her knees.  It was different than her "afib pain" and also different from the pain that she had a week ago, that led to her hospitalization and eventual ppm generator change.  She is currently pain free.  I advised that if she has any further chest pain, she is to call 911 for evaluation and probable ER visit.  She verbalized understanding.

## 2012-06-18 ENCOUNTER — Encounter: Payer: Self-pay | Admitting: *Deleted

## 2012-06-18 ENCOUNTER — Telehealth: Payer: Self-pay | Admitting: Cardiology

## 2012-06-18 NOTE — Telephone Encounter (Signed)
Spoke with patient who c/o chest pain that almost makes her "pass out" and brings her to her knees.  Patient spoke with Ward Givens, NP yesterday at around 7:45 pm with c/o same symptoms and was advised to call EMS if pain returned.  Patient states she didn't feel the pain again until now and she didn't know if she should call if pain wasn't constant.  Patient advised that she needs cardiac evaluation for severe chest pain.  Patient verbalized understanding and states she will call 911 now.

## 2012-06-18 NOTE — Telephone Encounter (Signed)
New Problem ° ° °

## 2012-06-24 ENCOUNTER — Encounter: Payer: Self-pay | Admitting: Internal Medicine

## 2012-06-27 ENCOUNTER — Ambulatory Visit: Payer: Medicare Other

## 2012-07-01 ENCOUNTER — Other Ambulatory Visit: Payer: Self-pay | Admitting: Internal Medicine

## 2012-07-01 ENCOUNTER — Ambulatory Visit (INDEPENDENT_AMBULATORY_CARE_PROVIDER_SITE_OTHER): Payer: Medicare Other | Admitting: *Deleted

## 2012-07-01 ENCOUNTER — Ambulatory Visit (INDEPENDENT_AMBULATORY_CARE_PROVIDER_SITE_OTHER): Payer: Medicare Other

## 2012-07-01 DIAGNOSIS — Z950 Presence of cardiac pacemaker: Secondary | ICD-10-CM | POA: Diagnosis not present

## 2012-07-01 DIAGNOSIS — I4891 Unspecified atrial fibrillation: Secondary | ICD-10-CM

## 2012-07-01 DIAGNOSIS — Z7901 Long term (current) use of anticoagulants: Secondary | ICD-10-CM | POA: Diagnosis not present

## 2012-07-01 DIAGNOSIS — R079 Chest pain, unspecified: Secondary | ICD-10-CM

## 2012-07-01 LAB — PACEMAKER DEVICE OBSERVATION
AL AMPLITUDE: 1.4 mv
ATRIAL PACING PM: 18.4
BRDY-0002RA: 50 {beats}/min
RV LEAD AMPLITUDE: 15.68 mv

## 2012-07-01 LAB — POCT INR: INR: 2.3

## 2012-07-01 NOTE — Progress Notes (Signed)
Patient presents for wound check of recent pacemaker generator change.  No problems with shortness of breath, palpitations, or syncope.  Patient still with intermittent chest pain.  Atypical and resolving.  EKG obtained today and reviewed by Dr Johney Frame.  Patient has follow up scheduled with Dr [place].  Pacemaker pocket with small hematoma.  No heat or drainage today.  Will follow.   Device interrogated and found to be functioning normally.  No changes made today.  See PaceArt report for full details.  Plan ROV with Dr. Johney Frame in 3 months.  Gypsy Balsam, RN, BSN 07/01/2012 3:44 PM

## 2012-07-03 ENCOUNTER — Ambulatory Visit: Payer: Medicare Other

## 2012-07-07 ENCOUNTER — Emergency Department (HOSPITAL_COMMUNITY): Payer: Medicare Other

## 2012-07-07 ENCOUNTER — Observation Stay (HOSPITAL_COMMUNITY): Payer: Medicare Other

## 2012-07-07 ENCOUNTER — Observation Stay (HOSPITAL_COMMUNITY)
Admission: EM | Admit: 2012-07-07 | Discharge: 2012-07-08 | Disposition: A | Discharge status: Home / Self Care | Payer: Medicare Other | Attending: Family Medicine | Admitting: Family Medicine

## 2012-07-07 ENCOUNTER — Encounter (HOSPITAL_COMMUNITY): Payer: Self-pay | Admitting: Emergency Medicine

## 2012-07-07 DIAGNOSIS — I951 Orthostatic hypotension: Secondary | ICD-10-CM | POA: Insufficient documentation

## 2012-07-07 DIAGNOSIS — I4891 Unspecified atrial fibrillation: Secondary | ICD-10-CM | POA: Diagnosis not present

## 2012-07-07 DIAGNOSIS — S99929A Unspecified injury of unspecified foot, initial encounter: Secondary | ICD-10-CM | POA: Diagnosis not present

## 2012-07-07 DIAGNOSIS — S0990XA Unspecified injury of head, initial encounter: Secondary | ICD-10-CM | POA: Diagnosis not present

## 2012-07-07 DIAGNOSIS — S4980XA Other specified injuries of shoulder and upper arm, unspecified arm, initial encounter: Secondary | ICD-10-CM | POA: Diagnosis not present

## 2012-07-07 DIAGNOSIS — E0590 Thyrotoxicosis, unspecified without thyrotoxic crisis or storm: Secondary | ICD-10-CM | POA: Diagnosis not present

## 2012-07-07 DIAGNOSIS — S40022A Contusion of left upper arm, initial encounter: Secondary | ICD-10-CM

## 2012-07-07 DIAGNOSIS — Z7901 Long term (current) use of anticoagulants: Secondary | ICD-10-CM | POA: Diagnosis not present

## 2012-07-07 DIAGNOSIS — R55 Syncope and collapse: Secondary | ICD-10-CM | POA: Diagnosis present

## 2012-07-07 DIAGNOSIS — S40029A Contusion of unspecified upper arm, initial encounter: Secondary | ICD-10-CM | POA: Diagnosis not present

## 2012-07-07 DIAGNOSIS — Z9181 History of falling: Secondary | ICD-10-CM | POA: Insufficient documentation

## 2012-07-07 DIAGNOSIS — M25569 Pain in unspecified knee: Secondary | ICD-10-CM | POA: Diagnosis not present

## 2012-07-07 DIAGNOSIS — S8000XA Contusion of unspecified knee, initial encounter: Secondary | ICD-10-CM | POA: Diagnosis not present

## 2012-07-07 DIAGNOSIS — S8001XA Contusion of right knee, initial encounter: Secondary | ICD-10-CM

## 2012-07-07 DIAGNOSIS — S59909A Unspecified injury of unspecified elbow, initial encounter: Secondary | ICD-10-CM | POA: Diagnosis not present

## 2012-07-07 DIAGNOSIS — S6990XA Unspecified injury of unspecified wrist, hand and finger(s), initial encounter: Secondary | ICD-10-CM | POA: Diagnosis not present

## 2012-07-07 DIAGNOSIS — D649 Anemia, unspecified: Secondary | ICD-10-CM | POA: Diagnosis not present

## 2012-07-07 DIAGNOSIS — Z950 Presence of cardiac pacemaker: Secondary | ICD-10-CM | POA: Insufficient documentation

## 2012-07-07 DIAGNOSIS — S8990XA Unspecified injury of unspecified lower leg, initial encounter: Secondary | ICD-10-CM | POA: Diagnosis not present

## 2012-07-07 DIAGNOSIS — M25519 Pain in unspecified shoulder: Secondary | ICD-10-CM | POA: Diagnosis not present

## 2012-07-07 DIAGNOSIS — S298XXA Other specified injuries of thorax, initial encounter: Secondary | ICD-10-CM | POA: Diagnosis not present

## 2012-07-07 DIAGNOSIS — I495 Sick sinus syndrome: Secondary | ICD-10-CM | POA: Insufficient documentation

## 2012-07-07 DIAGNOSIS — R404 Transient alteration of awareness: Principal | ICD-10-CM | POA: Insufficient documentation

## 2012-07-07 LAB — BASIC METABOLIC PANEL
Chloride: 106 mEq/L (ref 96–112)
GFR calc Af Amer: 51 mL/min — ABNORMAL LOW (ref >90)
GFR calc non Af Amer: 44 mL/min — ABNORMAL LOW (ref >90)
Potassium: 4.5 mEq/L (ref 3.5–5.1)
Sodium: 143 mEq/L (ref 135–145)

## 2012-07-07 LAB — TROPONIN I
Troponin I: <0.3 ng/mL (ref <0.30)
Troponin I: <0.3 ng/mL (ref <0.30)

## 2012-07-07 LAB — CBC WITH DIFFERENTIAL/PLATELET
Basophils Relative: 1 % (ref 0–1)
Eosinophils Absolute: 0.1 10*3/uL (ref 0.0–0.7)
Hemoglobin: 13.5 g/dL (ref 12.0–15.0)
MCH: 31.2 pg (ref 26.0–34.0)
MCHC: 33.7 g/dL (ref 30.0–36.0)
Neutro Abs: 2.7 10*3/uL (ref 1.7–7.7)
Neutrophils Relative %: 62 % (ref 43–77)
Platelets: 104 10*3/uL — ABNORMAL LOW (ref 150–400)
RBC: 4.33 MIL/uL (ref 3.87–5.11)

## 2012-07-07 LAB — PROTIME-INR
INR: 1.79 — ABNORMAL HIGH (ref 0.00–1.49)
Prothrombin Time: 20.2 seconds — ABNORMAL HIGH (ref 11.6–15.2)

## 2012-07-07 LAB — MAGNESIUM: Magnesium: 2.5 mg/dL (ref 1.5–2.5)

## 2012-07-07 MED ORDER — DOFETILIDE 250 MCG PO CAPS
250.0000 ug | ORAL_CAPSULE | Freq: Two times a day (BID) | ORAL | Status: DC
  Administered 2012-07-07 – 2012-07-08 (×2): 250 ug via ORAL
  Filled 2012-07-07 (×3): qty 1

## 2012-07-07 MED ORDER — SODIUM CHLORIDE 0.9 % IV SOLN: 250.0000 mL | INTRAVENOUS | Status: DC | PRN

## 2012-07-07 MED ORDER — WARFARIN - PHARMACIST DOSING INPATIENT: Freq: Every day | Status: DC

## 2012-07-07 MED ORDER — TRAMADOL HCL 50 MG PO TABS
50.0000 mg | ORAL_TABLET | Freq: Three times a day (TID) | ORAL | Status: DC | PRN
  Administered 2012-07-07: 50 mg via ORAL
  Filled 2012-07-07: qty 1

## 2012-07-07 MED ORDER — ATENOLOL 50 MG PO TABS
50.0000 mg | ORAL_TABLET | Freq: Every day | ORAL | Status: DC
  Administered 2012-07-08: 50 mg via ORAL
  Filled 2012-07-07 (×2): qty 1

## 2012-07-07 MED ORDER — ONDANSETRON HCL 4 MG/2ML IJ SOLN
4.0000 mg | Freq: Once | INTRAMUSCULAR | Status: AC
  Administered 2012-07-07: 4 mg via INTRAVENOUS
  Filled 2012-07-07: qty 2

## 2012-07-07 MED ORDER — ONDANSETRON HCL 4 MG PO TABS: 4.0000 mg | ORAL_TABLET | Freq: Four times a day (QID) | ORAL | Status: DC | PRN

## 2012-07-07 MED ORDER — ONDANSETRON HCL 4 MG/2ML IJ SOLN: 4.0000 mg | Freq: Four times a day (QID) | INTRAMUSCULAR | Status: DC | PRN

## 2012-07-07 MED ORDER — WARFARIN SODIUM 3 MG PO TABS
3.0000 mg | ORAL_TABLET | Freq: Once | ORAL | Status: AC
  Administered 2012-07-07: 3 mg via ORAL
  Filled 2012-07-07: qty 1

## 2012-07-07 MED ORDER — SODIUM CHLORIDE 0.9 % IJ SOLN
3.0000 mL | Freq: Two times a day (BID) | INTRAMUSCULAR | Status: DC
  Administered 2012-07-07 – 2012-07-08 (×3): 3 mL via INTRAVENOUS

## 2012-07-07 MED ORDER — INSULIN ASPART 100 UNIT/ML ~~LOC~~ SOLN: 0.0000 [IU] | Freq: Three times a day (TID) | SUBCUTANEOUS | Status: DC

## 2012-07-07 MED ORDER — SODIUM CHLORIDE 0.9 % IJ SOLN
3.0000 mL | Freq: Two times a day (BID) | INTRAMUSCULAR | Status: DC
  Administered 2012-07-07: 3 mL via INTRAVENOUS

## 2012-07-07 MED ORDER — FLUDROCORTISONE ACETATE 0.1 MG PO TABS
0.2000 mg | ORAL_TABLET | Freq: Two times a day (BID) | ORAL | Status: DC
  Administered 2012-07-07 – 2012-07-08 (×2): 0.2 mg via ORAL
  Filled 2012-07-07 (×4): qty 2

## 2012-07-07 MED ORDER — SODIUM CHLORIDE 0.9 % IJ SOLN: 3.0000 mL | INTRAMUSCULAR | Status: DC | PRN

## 2012-07-07 MED ORDER — POTASSIUM CHLORIDE CRYS ER 20 MEQ PO TBCR
40.0000 meq | EXTENDED_RELEASE_TABLET | Freq: Every day | ORAL | Status: DC
  Administered 2012-07-08: 40 meq via ORAL
  Filled 2012-07-07 (×2): qty 2

## 2012-07-07 NOTE — ED Notes (Signed)
Pt to XR

## 2012-07-07 NOTE — Progress Notes (Signed)
ANTICOAGULATION CONSULT NOTE - Initial Consult  Pharmacy Consult for Coumadin Indication: atrial fibrillation  Allergies  Allergen Reactions  . Amiodarone   . Amoxicillin   . Codeine   . Darvocet (Propoxyphene-Acetaminophen)   . Demerol   . Penicillins     Patient Measurements: Height: 5\' 3"  (160 cm) Weight: 120 lb (54.432 kg) IBW/kg (Calculated) : 52.4 Heparin Dosing Weight:   Vital Signs: Temp: 98 F (36.7 C) (05/04 0745) Temp src: Oral (05/04 0745) BP: 108/68 mmHg (05/04 1112) Pulse Rate: 64 (05/04 1112)  Labs:  Recent Labs  07/07/12 0800  HGB 13.5  HCT 40.1  PLT 104*  LABPROT 20.2*  INR 1.79*  CREATININE 1.13*    Estimated Creatinine Clearance: 31.8 ml/min (by C-G formula based on Cr of 1.13).   Medical History: Past Medical History  Diagnosis Date  . Thrombocytopenia   . H/O: CVA (cardiovascular accident)     Right internal capsule  . Orthostasis   . Anticoagulant long-term use   . Pacemaker   . Tachycardia-bradycardia syndrome   . Mild pulmonary hypertension   . Chronic anemia   . H/O syncope   . Chest pain     Has normal coronaries per cath in 2005; negative nuclear in 2010  . Atrial fibrillation     History of ablation - Dr. Johney Frame  . Hyperthyroidism   . Retinal detachment   . High risk medication use     Tikosyn & Coumadin  . Arthritis     Medications:  Prescriptions prior to admission  Medication Sig Dispense Refill  . atenolol (TENORMIN) 50 MG tablet Take 1 tablet (50 mg total) by mouth daily.  90 tablet  3  . dofetilide (TIKOSYN) 250 MCG capsule Take 1 capsule (250 mcg total) by mouth 2 (two) times daily.  180 capsule  2  . fludrocortisone (FLORINEF) 0.1 MG tablet Take 2 tablets (0.2 mg) twice a day  360 tablet  3  . potassium chloride SA (K-DUR,KLOR-CON) 20 MEQ tablet Take 40 mEq by mouth daily.      Marland Kitchen warfarin (COUMADIN) 4 MG tablet Take 2-4 mg by mouth daily. 2mg  daily except 4mg  on Tuesday, Thursday and Saturday         Assessment: Katrina Rodriguez on chronic Coumadin for hx Afib and CVA. Patient admitted with fall - CT head reported scalp hematoma but no intracranial abnormalities. INR (1.8) is just below goal level (level was therapeutic at 2.3 on 4/28 per Coumadin Clinic note). Patient reports home regimen is 2mg  daily except 4mg  on T/T/Sat - will plan to give slightly higher dose tonight (3mg ) and can most likely restart home regimen tomorrow.  - H/H wnl, Plts low (noted chronic thrombocytopenia) - No significant bleeding reported  Goal of Therapy:  INR 2-3   Plan:  1. Coumadin 3mg  po x 1 today 2. Daily INR  Cleon Dew [phone] 07/07/2012,11:59 AM

## 2012-07-07 NOTE — ED Notes (Signed)
Report given to Nurse, children's by [employer]

## 2012-07-07 NOTE — ED Notes (Signed)
Pt reports falling this AM and striking head on dresser. Pt does not remember if she tripped, does not remember becoming lightheaded or dizzy. Reports she remembers falling and hitting head, no LOC that she can remember. NAD at this time A/O x4

## 2012-07-07 NOTE — Consult Note (Signed)
Primary cardiologist: Dr. Peter Swaziland Primary electrophysiologist: Dr. Hillis Range  Clinical Summary Katrina Rodriguez is an 79 y.o.female with past medical history outlined below, presently being admitted to the hospital after an episode of syncope. She has a history of orthostatic syncope treated with Florinef, also paroxysmal atrial fibrillation and pacemaker in place, recently underwent generator change on 4/9 with Dr. Ladona Ridgel. Records show normal device function on recent check 4/28.  She states that she had gotten up as per usual this morning at 4 AM to eat breakfast. She was walking back into her room to read, states that the lights were on, not entirely clear that she tripped on anything, but apparently fell backwards and hit her head on her night stand. She does not recall feeling poorly at that time, but does indicate that she had been somewhat lightheaded when she got up earlier in the morning to go the bathroom. She states that she typically feels lightheaded and nauseated prior to her episodes of syncope, was not entirely the case this time. Otherwise no sense of palpitations or chest pain.   Allergies  Allergen Reactions  . Amiodarone   . Amoxicillin   . Codeine   . Darvocet (Propoxyphene-Acetaminophen)   . Demerol   . Penicillins     Medications (per recent discharge) Atenolol 50 mg by mouth daily Tikosyn 50 mcg by mouth twice a day Florinef 0.1 mg 2 tablets by mouth twice a day  Potassium chloride 20 mEq 2 tablets by mouth daily Coumadin 4 mg by mouth daily   Past Medical History  Diagnosis Date  . Thrombocytopenia   . H/O: CVA (cardiovascular accident)     Right internal capsule  . Orthostasis   . Anticoagulant long-term use   . Pacemaker   . Tachycardia-bradycardia syndrome   . Mild pulmonary hypertension   . Chronic anemia   . H/O syncope   . Chest pain     Has normal coronaries per cath in 2005; negative nuclear in 2010  . Atrial fibrillation     History of  ablation - Dr. Johney Frame  . Hyperthyroidism   . Retinal detachment   . High risk medication use     Tikosyn & Coumadin  . Arthritis     Past Surgical History  Procedure Laterality Date  . Cardiac pacemaker placement    . Cardiac catheterization    . Retinal detachment surgery      Family History  Problem Relation Age of Onset  . Heart disease Father   . Heart disease Brother   . Heart disease Brother     Social History Ms. Estell reports that she has never smoked. She has never used smokeless tobacco. Ms. Lorah reports that she does not drink alcohol.  Review of Systems Occasionally feels a "jumping" sensation in her lower chest as if her heart skips. No change in intermittent atypical chest pain symptoms. No fevers or chills. No orthopnea or PND. Stable appetite. No edema. Otherwise negative.  Physical Examination Blood pressure 133/63, temperature 98 F (36.7 C), temperature source Oral, resp. rate 16, height 5\' 3"  (1.6 m), weight 120 lb (54.432 kg), SpO2 100.00%.  Patient in no acute distress. HEENT: Conjunctiva and lids normal, oropharynx clear. Neck: Supple, no elevated JVP or carotid bruits, no thyromegaly. Lungs: Clear to auscultation, nonlabored breathing at rest. Cardiac: Regular rate and rhythm, no S3 or significant systolic murmur, no pericardial rub. Thorax: Well healing left upper chest pacer pocket site, minor induration but no erythema.  Abdomen: Soft, nontender, bowel sounds present, no guarding or rebound. Extremities: No pitting edema, distal pulses 2+. Skin: Warm and dry. Musculoskeletal: No kyphosis. Neuropsychiatric: Alert and oriented x3, affect grossly appropriate.  Lab Results Basic Metabolic Panel:  Recent Labs Lab 07/07/12 0800  NA 143  K 4.5  CL 106  CO2 28  GLUCOSE 119*  BUN 22  CREATININE 1.13*  CALCIUM 8.8   CBC:  Recent Labs Lab 07/07/12 0800  WBC 4.4  NEUTROABS 2.7  HGB 13.5  HCT 40.1  MCV 92.6  PLT 104*    ECG Sinus  rhythm with nonspecific T-wave changes, QTC 483 ms.  Imaging Head CT 5/4: IMPRESSION: Left parietal scalp hematoma without underlying fracture or intracranial hemorrhage.   Impression  1. Episode of syncope with baseline history of known orthostatic hypotension treated with Florinef. Patient also has pacemaker in place with recent generator change and normal device function based on office check last week. ECG was sinus rhythm, chest x-ray was stable appearance of pacer lead. Left parietal scalp hematoma by head CT without other acute fracture or hemorrhage.  2. Paroxysmal atrial fibrillation, treated with Tikosyn and Coumadin, prior history of ablation by Dr. Johney Frame.  3. History of normal coronary arteries at catheterization 2005 with negative nuclear study 2010.   Recommendations  Pacer dysfunction seems unlikely at this point based on recent generator change and interrogation. One would suspect recurrent orthostatic hypotension given her history. Would check formal measurements. Florinef is already at a reasonable dose. May need to consider ProAmatine if she has definite orthostatic changes on this regimen. Follow telemetry for any intercurrent arrhythmias. We will follow with you.  Jonelle Sidle, M.D., F.A.C.C.

## 2012-07-07 NOTE — H&P (Addendum)
[facility] History and Physical  ISAAC LACSON WUJ:[id number] DOB: 06/24/1928 DOA: 07/07/2012  Referring physician: Dr Nap. PCP: Allean Found, MD  Specialists: [facility]  Chief Complaint: Lost of consciousness, fall  HPI: Katrina Rodriguez is a 79 y.o. female with past medical history significant for tachybradycardia syndrome status post pacemaker, she recently underwent generator change on 4-9, history of orthostatic hypotension, presents to the emergency department after a fall and loss of consciousness. Patient relate that she wakes up as usual this morning. She was feeling a little lightheaded when she wake up, but that's usually normal for her. She was returning  from the bathroom, when she fell and hit her head with a dresser. She relates that she lost consciousness. She was then able to call for help. She denies any lightheadedness, palpitation, or chest pain prior to the episode.  She relates pain on her left shoulder and knee after the fall. She denies any worsening weakness. He has chronic left side weakness after a Stroke. She denies slurred speech, urinary or bowel incontinence, not tongue bite. She developed nausea while in the emergency department. She denies vomiting, abdominal pain, diarrhea. She had a bowel movement the day prior to admission. She does have to take medication to help with constipation.  Review of Systems:  Negative except as per HPI.   Past Medical History  Diagnosis Date  . Thrombocytopenia   . H/O: CVA (cardiovascular accident)     Right internal capsule  . Orthostasis   . Anticoagulant long-term use   . Pacemaker   . Tachycardia-bradycardia syndrome   . Mild pulmonary hypertension   . Chronic anemia   . H/O syncope   . Chest pain     Has normal coronaries per cath in 2005; negative nuclear in 2010  . Atrial fibrillation     History of ablation - Dr. Johney Frame  . Hyperthyroidism   . Retinal detachment   . High risk medication use    Tikosyn & Coumadin  . Arthritis    Past Surgical History  Procedure Laterality Date  . Cardiac pacemaker placement    . Cardiac catheterization    . Retinal detachment surgery     Social History:  reports that she has never smoked. She has never used smokeless tobacco. She reports that she does not drink alcohol or use illicit drugs. she lives by herself.    Allergies  Allergen Reactions  . Amiodarone   . Amoxicillin   . Codeine   . Darvocet (Propoxyphene-Acetaminophen)   . Demerol   . Penicillins     Family History  Problem Relation Age of Onset  . Heart disease Father   . Heart disease Brother   . Heart disease Brother     Prior to Admission medications   Medication Sig Start Date End Date Taking? Authorizing Provider  atenolol (TENORMIN) 50 MG tablet Take 1 tablet (50 mg total) by mouth daily. 11/22/11 11/21/12 Yes Peter M Swaziland, MD  dofetilide (TIKOSYN) 250 MCG capsule Take 1 capsule (250 mcg total) by mouth 2 (two) times daily. 04/03/12  Yes Peter M Swaziland, MD  fludrocortisone (FLORINEF) 0.1 MG tablet Take 2 tablets (0.2 mg) twice a day 03/11/12  Yes Peter M Swaziland, MD  potassium chloride SA (K-DUR,KLOR-CON) 20 MEQ tablet Take 40 mEq by mouth daily.   Yes Historical Provider, MD  warfarin (COUMADIN) 4 MG tablet Take 4 mg by mouth every evening.   Yes Historical Provider, MD   Physical Exam: Ceasar Mons  Vitals:   07/07/12 0800 07/07/12 0801 07/07/12 1046 07/07/12 1047  BP: 131/76  129/73 129/73  Pulse: 65  58   Temp:      TempSrc:      Resp: 15  15 16   Height:  5\' 3"  (1.6 m)    Weight:  54.432 kg (120 lb)    SpO2: 100%  97% 98%   General Appearance:    Alert, cooperative, no distress, appears stated age  Head:    Normocephalic, temporal area  hematoma.   Eyes:    PERRL, conjunctiva/corneas clear, EOM's intact, blind left eye.   Ears:    Normal TM's and external ear canals, both ears  Nose:   Nares normal, septum midline, mucosa normal, no drainage    or sinus tenderness   Throat:   Lips, mucosa, and tongue normal; teeth and gums normal  Neck:   Supple, symmetrical, trachea midline, no adenopathy;    thyroid:  no enlargement/tenderness/nodules; no carotid   bruit or JVD  Back:     Symmetric, no curvature, ROM normal, no CVA tenderness  Lungs:     Clear to auscultation bilaterally, respirations unlabored  Chest Wall:    Pacemaker area with no significant redness.    Heart:    Regular rate and rhythm, S1 and S2 normal, no murmur, rub   or gallop     Abdomen:     Soft, non-tender, bowel sounds active all four quadrants,    no masses, no organomegaly        Extremities:   Extremities normal, no edema, left knee with ecchymosis.   Pulses:   2+ and symmetric all extremities  Skin:   Skin color, texture, turgor normal, no rashes or lesions  Lymph nodes:   Cervical, supraclavicular, and axillary nodes normal  Neurologic:   CNII-XII intact, normal strength on the right. Left side with 4/5 muscle strengh, sensation and reflexes    throughout      Labs on Admission:  Basic Metabolic Panel:  Recent Labs Lab 07/07/12 0800  NA 143  K 4.5  CL 106  CO2 28  GLUCOSE 119*  BUN 22  CREATININE 1.13*  CALCIUM 8.8   Liver Function Tests: No results found for this basename: AST, ALT, ALKPHOS, BILITOT, PROT, ALBUMIN,  in the last 168 hours No results found for this basename: LIPASE, AMYLASE,  in the last 168 hours No results found for this basename: AMMONIA,  in the last 168 hours CBC:  Recent Labs Lab 07/07/12 0800  WBC 4.4  NEUTROABS 2.7  HGB 13.5  HCT 40.1  MCV 92.6  PLT 104*   Cardiac Enzymes: No results found for this basename: CKTOTAL, CKMB, CKMBINDEX, TROPONINI,  in the last 168 hours  BNP (last 3 results)  Recent Labs  06/11/12 1918  PROBNP 506.9*   CBG: No results found for this basename: GLUCAP,  in the last 168 hours  Radiological Exams on Admission: Dg Chest 2 View  07/07/2012  *RADIOLOGY REPORT*  Clinical Data: Fall and left  shoulder pain.  CHEST - 2 VIEW  Comparison: 06/11/2012  Findings: Stable appearance of the cardiac pacer leads.  Lungs are clear without pneumothorax.  Few densities at the left lung base are suggestive for atelectasis. Heart and mediastinum are within normal limits.  Bony thorax is intact.  IMPRESSION: Few left basilar densities are suggestive for atelectasis. Otherwise, no acute chest findings.   Original Report Authenticated By: Richarda Overlie, M.D.    Ct Head Wo  Contrast  07/07/2012  *RADIOLOGY REPORT*  Clinical Data: Loss of consciousness.  Fall.  On Coumadin.  Atrial fibrillation.  CT HEAD WITHOUT CONTRAST  Technique:  Contiguous axial images were obtained from the base of the skull through the vertex without contrast.  Comparison: 10/12/2011.  Findings: Left parietal scalp hematoma.  No skull fracture or intracranial hemorrhage.  Mild atrophy without hydrocephalus.  No intracranial mass lesion detected on this unenhanced exam.  No CT evidence of large acute infarct.  Post left orbital surgery with hyperdense globe.  Minimal dependent fluid right maxillary sinus.  IMPRESSION: Left parietal scalp hematoma without underlying fracture or intracranial hemorrhage.   Original Report Authenticated By: Lacy Duverney, M.D.    Dg Shoulder Left  07/07/2012  *RADIOLOGY REPORT*  Clinical Data: Fall and left shoulder pain.  LEFT SHOULDER - 2+ VIEW  Comparison: Chest radiograph 07/07/2012  Findings: Three views of the left shoulder are negative for a fracture or dislocation.  There is a left sided cardiac pacemaker. No evidence for a pneumothorax.  The left AC joint is intact. Visualized ribs are grossly intact.  IMPRESSION: No acute bony abnormality to the left shoulder.   Original Report Authenticated By: Richarda Overlie, M.D.    Dg Knee Complete 4 Views Right  07/07/2012  *RADIOLOGY REPORT*  Clinical Data: Fall and pain in the patella.  RIGHT KNEE - COMPLETE 4+ VIEW  Comparison: None.  Findings: Four views of the right knee are  negative for a fracture or dislocation.  There is no significant joint effusion.  Mild degenerative changes in the patellofemoral compartment.  IMPRESSION: Mild degenerative changes without acute bony abnormality.   Original Report Authenticated By: Richarda Overlie, M.D.     EKG: Independently reviewed. Sinus rhythm.   Assessment/Plan Principal Problem:   SYNCOPE Active Problems:   BRADYCARDIA-TACHYCARDIA SYNDROME   HYPOTENSION, ORTHOSTATIC   PPM-[employer]  1-Syncope: Patient neuro exam with no significant changes, she has chronic left side weakness. CT head negative for intracranial bleed. With her history of orthostatic hypotension this is a possibility but patient didn't feel usual symptoms. Will check orthostatic. Continue with florinef. Monitor on telemetry. Cycle cardiac enzymes. Cardiology consulted.  2-History of A fib: S/P pacemaker for tachy-brady syndrome. Continue with coumadin per pharmacy. Will defer to primary cardiologist decision of stopping coumadin due to risk for fall. Continue with Tikosyn and atenolol.  3-Fall with resultant Parietal scalp hematoma. 4-Mild renal insufficiency: Last Cr at 1.0. Mildly increase Cr at 1.1. Monitor for now.  5-Orthostatic Hypotension: Continue with Florinef.    Code Status: Patient wishes to be DNR/DNI Family Communication: Care discussed with patient.  Disposition Plan: Admit under observation.   Time spent: 75 minutes.   Armond Cuthrell [facility] Pager [phone]  If 7PM-7AM, please contact night-coverage [email or web address] Password [facility] 07/07/2012, 10:58 AM

## 2012-07-07 NOTE — ED Provider Notes (Addendum)
History    CSN: [id number] Arrival date & time 07/07/12  0724 First MD Initiated Contact with Patient 07/07/12 0730     Chief complaint: Fall  HPI Patient presents to the emergency room after having a fall this morning. The patient does not exactly know why this occurred. She does remember sitting up this morning reading the Bible. She started gotten up earlier and had breakfast. She was walking to the bathroom when she had a fall. Patient struck her head. She did have loss of consciousness. Patient reported to nursing staff that she did not have loss of consciousness her she told me she thinks she did.  She does not know why she fell. She does not tremor or becoming weak or dizzy but also does not remember stumbling or tripping. The patient has pain in her left shoulder as well as her right knee. She also has pain in the left side of her head. She did notice a small amount of blood in her hair. Patient does have history of tachybradycardia syndrome as well as orthostatic hypotension. She has had syncopal episodes in the past. Patient does have a pacemaker and recently had a battery placed in the last month. She denies chest pain fever, vomiting or diarrhea. She does not have any abdominal pain or shortness of breath. Past Medical History  Diagnosis Date  . Atrial fibrillation   . Thrombocytopenia   . H/O: CVA (cardiovascular accident)     right internal capsule  . Orthostasis   . Anticoagulant long-term use   . Pacemaker   . Tachycardia-bradycardia syndrome   . Mild pulmonary hypertension   . Chronic anemia   . Syncope, cardiogenic   . Chest pain     Has normal coronaries per cath in 2005; negative nuclear in 2010  . Palpitation   . Hyperthyroidism   . Retinal detachment   . High risk medication use     Tikosyn & Coumadin  . Stroke     tia  . Anginal pain   . Shortness of breath   . Arthritis     Past Surgical History  Procedure Laterality Date  . Cardiac pacemaker placement     . Cardiac catheterization    . Retinal detachment surgery    . Afib ablation      by Dr Johney Frame    Family History  Problem Relation Age of Onset  . Heart disease Father   . Heart disease Brother   . Heart disease Brother     History  Substance Use Topics  . Smoking status: Never Smoker   . Smokeless tobacco: Never Used  . Alcohol Use: No    OB History   Grav Para Term Preterm Abortions TAB SAB Ect Mult Living                  Review of Systems  All other systems reviewed and are negative.    Allergies  Amiodarone; Amoxicillin; Codeine; Darvocet; Demerol; and Penicillins  Home Medications   Current Outpatient Rx  Name  Route  Sig  Dispense  Refill  . atenolol (TENORMIN) 50 MG tablet   Oral   Take 1 tablet (50 mg total) by mouth daily.   90 tablet   3   . dofetilide (TIKOSYN) 250 MCG capsule   Oral   Take 1 capsule (250 mcg total) by mouth 2 (two) times daily.   180 capsule   2   . fludrocortisone (FLORINEF) 0.1 MG tablet  Take 2 tablets (0.2 mg) twice a day   360 tablet   3   . potassium chloride SA (K-DUR,KLOR-CON) 20 MEQ tablet   Oral   Take 40 mEq by mouth daily.         Marland Kitchen warfarin (COUMADIN) 4 MG tablet   Oral   Take 4 mg by mouth every evening.           BP 133/63  Temp(Src) 98 F (36.7 C) (Oral)  Resp 16  Ht 5\' 3"  (1.6 m)  Wt 120 lb (54.432 kg)  BMI 21.26 kg/m2  SpO2 100%  Physical Exam  Nursing note and vitals reviewed. Constitutional: She appears well-developed and well-nourished. No distress.  HENT:  Head: Normocephalic.  Right Ear: External ear normal.  Left Ear: External ear normal.  Tenderness to palpation left temporal region, small area of dried blood  Eyes: Conjunctivae are normal. Right eye exhibits no discharge. Left eye exhibits no discharge. No scleral icterus.  Neck: Neck supple. No tracheal deviation present.  Entire spine is nontender including the cervical spine  Cardiovascular: Normal rate, regular  rhythm and intact distal pulses.   Pulmonary/Chest: Effort normal and breath sounds normal. No stridor. No respiratory distress. She has no wheezes. She has no rales.  Pacemaker left anterior chest walls, small area of ecchymosis surrounding  Abdominal: Soft. Bowel sounds are normal. She exhibits no distension. There is no tenderness. There is no rebound and no guarding.  Musculoskeletal: She exhibits tenderness. She exhibits no edema.       Left shoulder: She exhibits tenderness and pain. She exhibits no laceration.       Right knee: She exhibits swelling. Tenderness found.       Cervical back: Normal.       Thoracic back: Normal.       Lumbar back: Normal.  Tenderness palpation with some ecchymoses and edema of the left proximal humerus and the right knee, and no tenderness to palpation or deformity elsewhere in all 4 extremities  Neurological: She is alert. She has normal strength. No sensory deficit. Cranial nerve deficit:  no gross defecits noted. She exhibits normal muscle tone. She displays no seizure activity. Coordination normal.  Skin: Skin is warm and dry. No rash noted.  Psychiatric: She has a normal mood and affect.    ED Course  Procedures (including critical care time) EKG Normal sinus rhythm rate 64 Normal axis, normal intervals except borderline prolonged QT Normal ST-T waves No significant change when compared to prior EKG except for resolvedT wave inversion.   Labs Reviewed  CBC WITH DIFFERENTIAL - Abnormal; Notable for the following:    Platelets 104 (*)    All other components within normal limits  BASIC METABOLIC PANEL - Abnormal; Notable for the following:    Glucose, Bld 119 (*)    Creatinine, Ser 1.13 (*)    GFR calc non Af Amer 44 (*)    GFR calc Af Amer 51 (*)    All other components within normal limits  PROTIME-INR - Abnormal; Notable for the following:    Prothrombin Time 20.2 (*)    INR 1.79 (*)    All other components within normal limits   Dg  Chest 2 View  07/07/2012  *RADIOLOGY REPORT*  Clinical Data: Fall and left shoulder pain.  CHEST - 2 VIEW  Comparison: 06/11/2012  Findings: Stable appearance of the cardiac pacer leads.  Lungs are clear without pneumothorax.  Few densities at the left lung base are  suggestive for atelectasis. Heart and mediastinum are within normal limits.  Bony thorax is intact.  IMPRESSION: Few left basilar densities are suggestive for atelectasis. Otherwise, no acute chest findings.   Original Report Authenticated By: Richarda Overlie, M.D.    Ct Head Wo Contrast  07/07/2012  *RADIOLOGY REPORT*  Clinical Data: Loss of consciousness.  Fall.  On Coumadin.  Atrial fibrillation.  CT HEAD WITHOUT CONTRAST  Technique:  Contiguous axial images were obtained from the base of the skull through the vertex without contrast.  Comparison: 10/12/2011.  Findings: Left parietal scalp hematoma.  No skull fracture or intracranial hemorrhage.  Mild atrophy without hydrocephalus.  No intracranial mass lesion detected on this unenhanced exam.  No CT evidence of large acute infarct.  Post left orbital surgery with hyperdense globe.  Minimal dependent fluid right maxillary sinus.  IMPRESSION: Left parietal scalp hematoma without underlying fracture or intracranial hemorrhage.   Original Report Authenticated By: Lacy Duverney, M.D.    Dg Shoulder Left  07/07/2012  *RADIOLOGY REPORT*  Clinical Data: Fall and left shoulder pain.  LEFT SHOULDER - 2+ VIEW  Comparison: Chest radiograph 07/07/2012  Findings: Three views of the left shoulder are negative for a fracture or dislocation.  There is a left sided cardiac pacemaker. No evidence for a pneumothorax.  The left AC joint is intact. Visualized ribs are grossly intact.  IMPRESSION: No acute bony abnormality to the left shoulder.   Original Report Authenticated By: Richarda Overlie, M.D.    Dg Knee Complete 4 Views Right  07/07/2012  *RADIOLOGY REPORT*  Clinical Data: Fall and pain in the patella.  RIGHT KNEE -  COMPLETE 4+ VIEW  Comparison: None.  Findings: Four views of the right knee are negative for a fracture or dislocation.  There is no significant joint effusion.  Mild degenerative changes in the patellofemoral compartment.  IMPRESSION: Mild degenerative changes without acute bony abnormality.   Original Report Authenticated By: Richarda Overlie, M.D.      1. Syncope   2. Head injury, initial encounter   3. Contusion of arm, left, initial encounter   4. Contusion of knee, right, initial encounter       MDM  Patient presents to the emergency room with a syncopal episode versus a fall.  Fortunately she does not appear to have any evidence of serious injuries or fractures. Patient does have a hematoma and is now complaining of some nausea.  Will admit for observation and monitoring.  Pacemaker interrogation ordered.  Cardiology consulted.     Celene Kras, MD 07/07/12 1610  Celene Kras, MD 07/07/12 [phone]

## 2012-07-08 DIAGNOSIS — R55 Syncope and collapse: Secondary | ICD-10-CM | POA: Diagnosis not present

## 2012-07-08 LAB — PROTIME-INR: INR: 1.81 — ABNORMAL HIGH (ref 0.00–1.49)

## 2012-07-08 MED ORDER — APIXABAN 2.5 MG PO TABS: 2.5000 mg | ORAL_TABLET | Freq: Two times a day (BID) | ORAL | Status: DC

## 2012-07-08 MED ORDER — WARFARIN SODIUM 3 MG PO TABS
3.0000 mg | ORAL_TABLET | Freq: Once | ORAL | Status: DC
  Filled 2012-07-08: qty 1

## 2012-07-08 NOTE — Progress Notes (Signed)
ANTICOAGULATION CONSULT NOTE - follow up  Pharmacy Consult for Coumadin Indication: atrial fibrillation  Allergies  Allergen Reactions  . Amiodarone   . Amoxicillin   . Codeine   . Darvocet (Propoxyphene-Acetaminophen)   . Demerol   . Penicillins     Patient Measurements: Height: 5\' 3"  (160 cm) Weight: 120 lb (54.432 kg) IBW/kg (Calculated) : 52.4  Vital Signs: Temp: 99 F (37.2 C) (05/05 0552) Temp src: Oral (05/05 0552) BP: 117/65 mmHg (05/05 0552) Pulse Rate: 58 (05/05 0552)  Labs:  Recent Labs  07/07/12 0800 07/07/12 1144 07/07/12 1650 07/07/12 2310 07/08/12 0450  HGB 13.5  --   --   --   --   HCT 40.1  --   --   --   --   PLT 104*  --   --   --   --   LABPROT 20.2*  --   --   --  20.3*  INR 1.79*  --   --   --  1.81*  CREATININE 1.13*  --   --   --   --   TROPONINI  --  <0.30 <0.30 <0.30  --     Estimated Creatinine Clearance: 31.8 ml/min (by C-G formula based on Cr of 1.13).    Assessment: 84yof on chronic Coumadin for hx Afib and CVA. Patient admitted with fall - CT head reported scalp hematoma but no intracranial abnormalities. INR (1.81) remains just below goal level (level was therapeutic at 2.3 on 4/28 per Coumadin Clinic note). Patient reports home regimen is 2mg  daily except 4mg  on T/T/Sat - will plan to repeat slightly higher dose tonight (3mg ) and can most likely restart home regimen tomorrow.  - H/H wnl, Plts low (noted chronic thrombocytopenia) - No significant bleeding reported She has also been continued on her home tikosyn 250 mcg po BID.  Her mag level is 2.5 today.  Her QTc is 483.  Her Creat cl by Cockcroft-Gault is ~ 33 ml/min. Per package insert the dose is 125 mcg bid for this renal fxn. Noted that she may be switched to apixaban 2nd to fall hx.  Goal of Therapy:  INR 2-3   Plan:  1. Repeat Coumadin 3mg  po x 1 today 2. Daily INR 3. If switched to apixaban dose would be 2.5 mg po bid 2nd to age > 80 and wt < 60 kg. 4. Evaluate  tikosyn dose Thanks Herby Abraham, Pharm.D. [phone] 07/08/2012 8:46 AM

## 2012-07-08 NOTE — Progress Notes (Signed)
Discharge instructions given and reviewed with patient. Patient discharged home with family. 

## 2012-07-08 NOTE — Evaluation (Signed)
Physical Therapy Evaluation Patient Details Name: PELAGIA Rodriguez MRN: [id number] DOB: 08-28-1928 Today's Date: 07/08/2012 Time: 4098-1191 PT Time Calculation (min): 13 min  PT Assessment / Plan / Recommendation Clinical Impression  pt presents with syncopal episode.  pt lives alone, but states her church family checks on her daily.  pt indicates she will be getting a medical alert system added to ehr home security system.  pt interested in 4WW for home.      PT Assessment  All further PT needs can be met in the next venue of care    Follow Up Recommendations  Home health PT;Supervision - Intermittent    Does the patient have the potential to tolerate intense rehabilitation      Barriers to Discharge        Equipment Recommendations   (4NW and 3-in-1)    Recommendations for Other Services     Frequency      Precautions / Restrictions Precautions Precautions: Fall;ICD/Pacemaker Restrictions Weight Bearing Restrictions: No   Pertinent Vitals/Pain Denies pain.        Mobility  Bed Mobility Bed Mobility: Not assessed Details for Bed Mobility Assistance: pt standing at sink with Nsg Tech.   Transfers Transfers: Stand to Sit Stand to Sit: 5: Supervision Details for Transfer Assistance: cues to get closer to bed prior to sitting.   Ambulation/Gait Ambulation/Gait Assistance: 4: Min guard;4: Min Environmental consultant (Feet): 120 Feet Assistive device: 1 person hand held assist Ambulation/Gait Assistance Details: pt occasionally uses HHA or rail in hallway for support though mostly used when trying to turn head and speak with PT.  ? Visual deficits causing insecurity ambulating.   Gait Pattern: Step-through pattern;Decreased stride length Stairs: No Wheelchair Mobility Wheelchair Mobility: No    Exercises     PT Diagnosis:    PT Problem List:   PT Treatment Interventions:     PT Goals    Visit Information  Last PT Received On: 07/08/12 Assistance Needed: +1   Subjective Data  Subjective: I have a great church family.   Patient Stated Goal: Home   Prior Functioning  Home Living Lives With: Alone Type of Home: House Home Access: Stairs to enter Entrance Stairs-Number of Steps: 3-4 Entrance Stairs-Rails: Can reach both Home Layout: One level Bathroom Shower/Tub: Walk-in shower;Tub/shower unit;Curtain (prefers bathtubs) Firefighter: Handicapped height Home Adaptive Equipment: Straight cane;Walker - rolling Additional Comments: people from church drive her to the doctor and store to get groceries.  pt notes church friends also check in on her daily.  pt states she will be getting a medical alert button and automatic fall alarm from her home security system company.   Prior Function Level of Independence: Independent Able to Take Stairs?: Yes Driving: No Vocation: Retired Musician: No difficulties Dominant Hand: Right    Cognition  Cognition Arousal/Alertness: Awake/alert Behavior During Therapy: WFL for tasks assessed/performed Overall Cognitive Status: Within Functional Limits for tasks assessed    Extremity/Trunk Assessment Right Upper Extremity Assessment RUE ROM/Strength/Tone: Within functional levels RUE Sensation: WFL - Light Touch RUE Coordination: WFL - gross/fine motor Left Upper Extremity Assessment LUE ROM/Strength/Tone: WFL for tasks assessed (reports weak from the TIAs hx) LUE Sensation: WFL - Light Touch LUE Coordination: WFL - gross/fine motor Right Lower Extremity Assessment RLE ROM/Strength/Tone: WFL for tasks assessed RLE Sensation: WFL - Light Touch Left Lower Extremity Assessment LLE ROM/Strength/Tone: WFL for tasks assessed (Notes hx TIAs make it weak, but demos good strength.  ) LLE Sensation: WFL -  Light Touch Trunk Assessment Trunk Assessment: Normal   Balance Balance Balance Assessed: Yes Static Standing Balance Static Standing - Balance Support: No upper extremity  supported;During functional activity Static Standing - Level of Assistance: 5: Stand by assistance  End of Session PT - End of Session Equipment Utilized During Treatment: Gait belt Activity Tolerance: Patient tolerated treatment well Patient left: in bed;with call bell/phone within reach;with bed alarm set Nurse Communication: Mobility status  GP Functional Assessment Tool Used: Clinical Judgement Functional Limitation: Mobility: Walking and moving around Mobility: Walking and Moving Around Current Status (B1478): At least 1 percent but less than 20 percent impaired, limited or restricted Mobility: Walking and Moving Around Goal Status [phone]): At least 1 percent but less than 20 percent impaired, limited or restricted Mobility: Walking and Moving Around Discharge Status [phone]): At least 1 percent but less than 20 percent impaired, limited or restricted   Katrina Rodriguez, [place] [phone] 07/08/2012, 9:57 AM

## 2012-07-08 NOTE — Progress Notes (Signed)
   Subjective:  Denies CP or dyspnea   Objective:  Filed Vitals:   07/07/12 1112 07/07/12 1200 07/07/12 2122 07/08/12 0552  BP: 108/68 110/71 110/57 117/65  Pulse: 64 58 59 58  Temp:  98.1 F (36.7 C) 99 F (37.2 C) 99 F (37.2 C)  TempSrc:  Oral Oral Oral  Resp:  17 18 18   Height:      Weight:      SpO2:  97% 96% 96%    Intake/Output from previous day:  Intake/Output Summary (Last 24 hours) at 07/08/12 0800 Last data filed at 07/07/12 1842  Gross per 24 hour  Intake    246 ml  Output      0 ml  Net    246 ml    Physical Exam: Physical exam: Well-developed well-nourished in no acute distress.  Skin is warm and dry.  HEENT small hematoma left occipital area. Neck is supple.  Chest is clear to auscultation with normal expansion.  Cardiovascular exam is regular rate and rhythm.  Abdominal exam nontender or distended. No masses palpated. Extremities show no edema. neuro grossly intact    Lab Results: Basic Metabolic Panel:  Recent Labs  45/40/98 0800 07/07/12 1144  NA 143  --   K 4.5  --   CL 106  --   CO2 28  --   GLUCOSE 119*  --   BUN 22  --   CREATININE 1.13*  --   CALCIUM 8.8  --   MG  --  2.5   CBC:  Recent Labs  07/07/12 0800  WBC 4.4  NEUTROABS 2.7  HGB 13.5  HCT 40.1  MCV 92.6  PLT 104*   Cardiac Enzymes:  Recent Labs  07/07/12 1144 07/07/12 1650 07/07/12 2310  TROPONINI <0.30 <0.30 <0.30     Assessment/Plan:  Syncope-description sounds to be orthostatic mediated and long history of these events. I will ask that his pacemaker be interrogated. If normal function and no elevated heart rates, we'll discharge and outpatient followup with Dr. [place]. Previous echo with normal LV function. 2 paroxysmal atrial fibrillation-continue present medications. Given history of recurrent syncope risk of anticoagulation is significant. However she has had TIAs. She will discuss anticoagulation further with Dr. [place]. It may be reasonable to  change to apixaban.  Olga Millers 07/08/2012, 8:00 AM

## 2012-07-08 NOTE — Care Management Note (Signed)
    Page 1 of 2   07/08/2012     11:24:05 AM   CARE MANAGEMENT NOTE 07/08/2012  Patient:  Katrina Rodriguez, Katrina Rodriguez   Account Number:  [id number]  Date Initiated:  07/08/2012  Documentation initiated by:  GRAVES-BIGELOW,Cabria Micalizzi  Subjective/Objective Assessment:   Admitted for syncope. Plan for discharge today after pacer interrogation. Pt lives home alone, however, she will have 24 hr supervision by friend. Pt states she has rolling walker at home.     Action/Plan:   Offered choice for home health services. Pt has used [facility] in the past. PT, OT, RN. Referral was made with [facility].  SOC to begin within 24-48 hrs of discharge.   Anticipated DC Date:  07/08/2012   Anticipated DC Plan:  HOME W HOME HEALTH SERVICES      DC Planning Services  CM consult      [facility] Choice  HOME HEALTH  DURABLE MEDICAL EQUIPMENT   Choice offered to / List presented to:  C-1 Patient   DME arranged  3-N-1      DME agency  [facility]     [facility] arranged  HH-1 RN  HH-2 PT  HH-3 OT      [facility] agency  [facility]   Status of service:  Completed, signed off Medicare Important Message given?   (If response is "NO", the following Medicare IM given date fields will be blank) Date Medicare IM given:   Date Additional Medicare IM given:    Discharge Disposition:  HOME W HOME HEALTH SERVICES  Per UR Regulation:  Reviewed for med. necessity/level of care/duration of stay  If discussed at Long Length of Stay Meetings, dates discussed:    Comments:

## 2012-07-08 NOTE — Evaluation (Signed)
Occupational Therapy Evaluation Patient Details Name: Katrina Rodriguez MRN: [id number] DOB: 1928-12-12 Today's Date: 07/08/2012 Time: 4098-1191 OT Time Calculation (min): 24 min  OT Assessment / Plan / Recommendation Clinical Impression  79 yo female s/p + LOC fall that does not need acute OT but could benefit from home safety eval. Ot to sign off . Recommend 3n1 nad home therapy.    OT Assessment  All further OT needs can be met in the next venue of care    Follow Up Recommendations  Home health OT    Barriers to Discharge      Equipment Recommendations  3 in 1 bedside comode (pt requesting for master bedroom)    Recommendations for Other Services    Frequency       Precautions / Restrictions Precautions Precautions: Fall;ICD/Pacemaker Restrictions Weight Bearing Restrictions: No   Pertinent Vitals/Pain No c/o    ADL  Grooming: Wash/dry hands;Wash/dry face;Modified independent Where Assessed - Grooming: Unsupported standing Lower Body Bathing: Modified independent Where Assessed - Lower Body Bathing: Unsupported sit to stand Lower Body Dressing: Modified independent Where Assessed - Lower Body Dressing: Unsupported sit to stand Toilet Transfer: Modified independent Toilet Transfer Method: Sit to Barista: Regular height toilet Toileting - Clothing Manipulation and Hygiene: Modified independent Where Assessed - Toileting Clothing Manipulation and Hygiene: Sit to stand from 3-in-1 or toilet Transfers/Ambulation Related to ADLs: pt ambulating with arms extended due to visual deficits. Pt able to navigate with incr reaching after prolonged grooming and ambulating to bathroom ADL Comments: Pt requesting 3n1 for bathroom and states that she doesnt like showers and prefer to get down into the tub. Pt could benefit from home safety eval to assess home environment and equipment. Pt stood at sink and completed full adl and washed hair in sink.     OT Diagnosis:     OT Problem List:   OT Treatment Interventions:     OT Goals    Visit Information  Last OT Received On: 07/08/12 Assistance Needed: +1    Subjective Data  Subjective: "me and My husband had a big 3 story house but I sold that" Patient Stated Goal: to return home   Prior Functioning     Home Living Lives With: Alone Type of Home: House Home Access: Stairs to enter [employer] of Steps: 3-4 Entrance Stairs-Rails: Can reach both Home Layout: One level Bathroom Shower/Tub: Walk-in shower;Tub/shower unit;Curtain (prefers bathtubs) Firefighter: Handicapped height Home Adaptive Equipment: Straight cane;Walker - rolling Additional Comments: people from church drive her to the doctor and store to get groceries.  pt notes church friends also check in on her daily.  pt states she will be getting a medical alert button and automatic fall alarm from her home security system company.   Prior Function Level of Independence: Independent Able to Take Stairs?: Yes Driving: No Vocation: Retired Musician: No difficulties Dominant Hand: Right         Vision/Perception Vision - History Baseline Vision: Legally blind Vision - Assessment Eye Alignment: Within Functional Limits Vision Assessment: Vision not tested   [employer] Arousal/Alertness: Awake/alert Behavior During Therapy: WFL for tasks assessed/performed Overall Cognitive Status: Within Functional Limits for tasks assessed    Extremity/Trunk Assessment Right Upper Extremity Assessment RUE ROM/Strength/Tone: Within functional levels RUE Sensation: WFL - Light Touch RUE Coordination: WFL - gross/fine motor Left Upper Extremity Assessment LUE ROM/Strength/Tone: WFL for tasks assessed (reports weak from the TIAs hx) LUE Sensation: WFL - Light  Touch LUE Coordination: WFL - gross/fine motor Right Lower Extremity Assessment RLE ROM/Strength/Tone: WFL for tasks assessed RLE  Sensation: WFL - Light Touch Left Lower Extremity Assessment LLE ROM/Strength/Tone: WFL for tasks assessed (Notes hx TIAs make it weak, but demos good strength.  ) LLE Sensation: WFL - Light Touch Trunk Assessment Trunk Assessment: Normal     Mobility Bed Mobility Bed Mobility: Supine to Sit;Sitting - Scoot to Edge of Bed Supine to Sit: 7: Independent;HOB flat Sitting - Scoot to Edge of Bed: 7: Independent Details for Bed Mobility Assistance: pt standing at sink with Nsg Tech.   Transfers Transfers: Sit to Stand;Stand to Sit Sit to Stand: 5: Supervision;With upper extremity assist;From bed Stand to Sit: 5: Supervision Details for Transfer Assistance: cues to get closer to bed prior to sitting.       Exercise     Balance     End of Session OT - End of Session Activity Tolerance: Patient tolerated treatment well Patient left: in chair;with call bell/phone within reach Nurse Communication: Mobility status;Precautions  GO Functional Assessment Tool Used: clinical judgement Functional Limitation: Self care Self Care Current Status (W0981): 0 percent impaired, limited or restricted Self Care Goal Status (X9147): 0 percent impaired, limited or restricted Self Care Discharge Status [phone]): 0 percent impaired, limited or restricted   Lucile Shutters 07/08/2012, 9:19 AM Pager: [phone]

## 2012-07-08 NOTE — Discharge Summary (Signed)
Physician Discharge Summary  Katrina Rodriguez ZOX:[id number] DOB: October 31, 1928 DOA: 07/07/2012  PCP: Allean Found, MD  Admit date: 07/07/2012 Discharge date: 07/08/2012  Time spent: 40 minutes  Recommendations for Outpatient Follow-up:  1. Recommend home health physical therapy, recommend rolling walker and bedside commode 2. Has been changed per cardiology recommendations to Eliquis set of Coumadin 3. Needs Bmet magnesium level-please reevaluate her Tikosyn dosing as an outpatient with cardiology 4. Pacemaker was interrogated at function checked on 07/01/12 and was reevaluated in the hospital and it was felt to be anomalies which Dr. Ladona Ridgel is aware  Discharge Diagnoses:  Principal Problem:   SYNCOPE Active Problems:   BRADYCARDIA-TACHYCARDIA SYNDROME   HYPOTENSION, ORTHOSTATIC   PPM-[employer]   Discharge Condition: Stable  Diet recommendation: Heart healthy  Filed Weights   07/07/12 0801  Weight: 54.432 kg (120 lb)    History of present illness:  This 79 year old female had an episode of syncope 07/07/2012 she has history of orthostatic syncope treated with Florinef and also paroxysmal A. fib with tachybradycardia syndrome and underwent generator change 06/12/2012 device check 06/23/2012 stable. She fell and hit her head she does not recall feeling poorly the patient somewhat lightheaded when she got up and she seems somewhat lightheaded and nauseous prior to her episode syncope she denied any chest pain shortness of breath. Somewhat likely that her symptoms were secondary to orthostasis-orthostatic checks did not show a significant drop however Cardiology evaluated the patient patient underwent pacemaker evaluation showing only pacing will have been changed. Discussed with Dr. Ladona Ridgel by [employer] revealed that the patient's sensation future and to bipolar lead monitoring only been changed percent stage I level. Patient was felt to have been stable for discharge and her medications  were not changed other than warfarin being transitioned to ELiquis or patient's complaints.  Discharge Exam: Filed Vitals:   07/07/12 1112 07/07/12 1200 07/07/12 2122 07/08/12 0552  BP: 108/68 110/71 110/57 117/65  Pulse: 64 58 59 58  Temp:  98.1 F (36.7 C) 99 F (37.2 C) 99 F (37.2 C)  TempSrc:  Oral Oral Oral  Resp:  17 18 18   Height:      Weight:      SpO2:  97% 96% 96%   Alert pleasant oriented  General: Icterus or pallor no blurred or double Cardiovascular: S1-S2 A Fib on monitor Respiratory: Clear  Discharge Instructions   Future Appointments Provider Department Dept Phone   07/25/2012 11:15 AM [facility] [phone]   07/25/2012 11:30 AM Peter M [place], MD [facility] Main Office [place]) [phone]       Medication List    STOP taking these medications       warfarin 4 MG tablet  Commonly known as:  COUMADIN      TAKE these medications       apixaban 2.5 MG Tabs tablet  Commonly known as:  ELIQUIS  Take 1 tablet (2.5 mg total) by mouth 2 (two) times daily.     atenolol 50 MG tablet  Commonly known as:  TENORMIN  Take 1 tablet (50 mg total) by mouth daily.     dofetilide 250 MCG capsule  Commonly known as:  TIKOSYN  Take 1 capsule (250 mcg total) by mouth 2 (two) times daily.     fludrocortisone 0.1 MG tablet  Commonly known as:  FLORINEF  Take 2 tablets (0.2 mg) twice a day     potassium chloride SA 20 MEQ tablet  Commonly known as:  K-DUR,KLOR-CON  Take 40 mEq by mouth daily.       Allergies  Allergen Reactions  . Amiodarone   . Amoxicillin   . Codeine   . Darvocet (Propoxyphene-Acetaminophen)   . Demerol   . Penicillins       The results of significant diagnostics from this hospitalization (including imaging, microbiology, ancillary and laboratory) are listed below for reference.    Significant Diagnostic Studies: Dg Chest 2 View  07/07/2012  *RADIOLOGY REPORT*  Clinical  Data: Fall and left shoulder pain.  CHEST - 2 VIEW  Comparison: 06/11/2012  Findings: Stable appearance of the cardiac pacer leads.  Lungs are clear without pneumothorax.  Few densities at the left lung base are suggestive for atelectasis. Heart and mediastinum are within normal limits.  Bony thorax is intact.  IMPRESSION: Few left basilar densities are suggestive for atelectasis. Otherwise, no acute chest findings.   Original Report Authenticated By: Richarda Overlie, M.D.    Dg Chest 2 View  06/11/2012  *RADIOLOGY REPORT*  Clinical Data: Mid to left-sided chest pain.  CHEST - 2 VIEW  Comparison: One-view chest 06/15/2010.  Findings: Heart size is normal. Pacing wires are stable. Emphysematous changes are present.  No focal airspace disease is evident.  The visualized soft tissues and bony thorax are unremarkable.  IMPRESSION:  1.  Emphysema. 2.  No acute cardiopulmonary disease.   Original Report Authenticated By: Marin Roberts, M.D.    Dg Wrist 2 Views Left  07/07/2012  *RADIOLOGY REPORT*  Clinical Data: Fall, posterior left rib pain.  LEFT WRIST - 2 VIEW  Comparison: None.  Findings: Degenerative changes in the left wrist and first carpometacarpal joint. No acute bony abnormality.  Specifically, no fracture, subluxation, or dislocation.  Soft tissues are intact.  IMPRESSION: No acute bony abnormality.   Original Report Authenticated By: Charlett Nose, M.D.    Ct Head Wo Contrast  07/07/2012  *RADIOLOGY REPORT*  Clinical Data: Loss of consciousness.  Fall.  On Coumadin.  Atrial fibrillation.  CT HEAD WITHOUT CONTRAST  Technique:  Contiguous axial images were obtained from the base of the skull through the vertex without contrast.  Comparison: 10/12/2011.  Findings: Left parietal scalp hematoma.  No skull fracture or intracranial hemorrhage.  Mild atrophy without hydrocephalus.  No intracranial mass lesion detected on this unenhanced exam.  No CT evidence of large acute infarct.  Post left orbital surgery with  hyperdense globe.  Minimal dependent fluid right maxillary sinus.  IMPRESSION: Left parietal scalp hematoma without underlying fracture or intracranial hemorrhage.   Original Report Authenticated By: Lacy Duverney, M.D.    Ct Angio Chest Pe W/cm &/or Wo Cm  06/12/2012  *RADIOLOGY REPORT*  Clinical Data: Chest pain  CT ANGIOGRAPHY CHEST  Technique:  Multidetector CT imaging of the chest using the standard protocol during bolus administration of intravenous contrast. Multiplanar reconstructed images including MIPs were obtained and reviewed to evaluate the vascular anatomy.  Contrast: 80mL OMNIPAQUE IOHEXOL 350 MG/ML SOLN  Comparison: 05/06/2007  Findings: No filling defects in the pulmonary arterial tree to suggest acute pulmonary thromboembolism.  Negative abnormal mediastinal adenopathy.  Negative pericardial effusion.  Left anterior descending coronary artery calcifications. Mild atherosclerotic changes of the aorta.  Dependent atelectasis bilaterally.  No acute bony deformity.  Small calcified splenic artery aneurysm on image 123 is not excluded.  IMPRESSION: No evidence of acute pulmonary thromboembolism.  Small splenic artery aneurysm is not excluded.   Original Report Authenticated By: Jolaine Click, M.D.    Dg Shoulder  Left  07/07/2012  *RADIOLOGY REPORT*  Clinical Data: Fall and left shoulder pain.  LEFT SHOULDER - 2+ VIEW  Comparison: Chest radiograph 07/07/2012  Findings: Three views of the left shoulder are negative for a fracture or dislocation.  There is a left sided cardiac pacemaker. No evidence for a pneumothorax.  The left AC joint is intact. Visualized ribs are grossly intact.  IMPRESSION: No acute bony abnormality to the left shoulder.   Original Report Authenticated By: Richarda Overlie, M.D.    Dg Knee Complete 4 Views Right  07/07/2012  *RADIOLOGY REPORT*  Clinical Data: Fall and pain in the patella.  RIGHT KNEE - COMPLETE 4+ VIEW  Comparison: None.  Findings: Four views of the right knee are  negative for a fracture or dislocation.  There is no significant joint effusion.  Mild degenerative changes in the patellofemoral compartment.  IMPRESSION: Mild degenerative changes without acute bony abnormality.   Original Report Authenticated By: Richarda Overlie, M.D.     Microbiology: No results found for this or any previous visit (from the past 240 hour(s)).   Labs: Basic Metabolic Panel:  Recent Labs Lab 07/07/12 0800 07/07/12 1144  NA 143  --   K 4.5  --   CL 106  --   CO2 28  --   GLUCOSE 119*  --   BUN 22  --   CREATININE 1.13*  --   CALCIUM 8.8  --   MG  --  2.5   Liver Function Tests: No results found for this basename: AST, ALT, ALKPHOS, BILITOT, PROT, ALBUMIN,  in the last 168 hours No results found for this basename: LIPASE, AMYLASE,  in the last 168 hours No results found for this basename: AMMONIA,  in the last 168 hours CBC:  Recent Labs Lab 07/07/12 0800  WBC 4.4  NEUTROABS 2.7  HGB 13.5  HCT 40.1  MCV 92.6  PLT 104*   Cardiac Enzymes:  Recent Labs Lab 07/07/12 1144 07/07/12 1650 07/07/12 2310  TROPONINI <0.30 <0.30 <0.30   BNP: BNP (last 3 results)  Recent Labs  06/11/12 1918  PROBNP 506.9*   CBG: No results found for this basename: GLUCAP,  in the last 168 hours     Signed:  Rhetta Mura  [facility] 07/08/2012, 11:02 AM

## 2012-07-09 ENCOUNTER — Other Ambulatory Visit: Payer: Self-pay | Admitting: *Deleted

## 2012-07-09 ENCOUNTER — Other Ambulatory Visit: Payer: Self-pay | Admitting: Emergency Medicine

## 2012-07-09 MED ORDER — APIXABAN 2.5 MG PO TABS: 2.5000 mg | ORAL_TABLET | Freq: Two times a day (BID) | ORAL | Status: DC

## 2012-07-09 MED ORDER — DOFETILIDE 250 MCG PO CAPS: 250.0000 ug | ORAL_CAPSULE | Freq: Two times a day (BID) | ORAL | Status: DC

## 2012-07-09 NOTE — Telephone Encounter (Signed)
Patient states medication got sent to wrong pharmacy

## 2012-07-10 ENCOUNTER — Telehealth: Payer: Self-pay

## 2012-07-10 DIAGNOSIS — M159 Polyosteoarthritis, unspecified: Secondary | ICD-10-CM | POA: Diagnosis not present

## 2012-07-10 DIAGNOSIS — Z9181 History of falling: Secondary | ICD-10-CM | POA: Diagnosis not present

## 2012-07-10 DIAGNOSIS — D649 Anemia, unspecified: Secondary | ICD-10-CM | POA: Diagnosis not present

## 2012-07-10 DIAGNOSIS — I495 Sick sinus syndrome: Secondary | ICD-10-CM | POA: Diagnosis not present

## 2012-07-10 DIAGNOSIS — R55 Syncope and collapse: Secondary | ICD-10-CM | POA: Diagnosis not present

## 2012-07-10 DIAGNOSIS — I951 Orthostatic hypotension: Secondary | ICD-10-CM | POA: Diagnosis not present

## 2012-07-10 DIAGNOSIS — I4891 Unspecified atrial fibrillation: Secondary | ICD-10-CM | POA: Diagnosis not present

## 2012-07-10 DIAGNOSIS — Z8673 Personal history of transient ischemic attack (TIA), and cerebral infarction without residual deficits: Secondary | ICD-10-CM | POA: Diagnosis not present

## 2012-07-10 NOTE — Telephone Encounter (Signed)
Received a fax for prior authorization for eliquis 2.5 mg twice a day.[facility] called [phone].Patient's ID # [id number].Eliquis approved for 1 year. [facility] notified.Patient also called and notified.

## 2012-07-12 DIAGNOSIS — I951 Orthostatic hypotension: Secondary | ICD-10-CM | POA: Diagnosis not present

## 2012-07-12 DIAGNOSIS — I4891 Unspecified atrial fibrillation: Secondary | ICD-10-CM | POA: Diagnosis not present

## 2012-07-12 DIAGNOSIS — R55 Syncope and collapse: Secondary | ICD-10-CM | POA: Diagnosis not present

## 2012-07-12 DIAGNOSIS — D649 Anemia, unspecified: Secondary | ICD-10-CM | POA: Diagnosis not present

## 2012-07-12 DIAGNOSIS — M159 Polyosteoarthritis, unspecified: Secondary | ICD-10-CM | POA: Diagnosis not present

## 2012-07-12 DIAGNOSIS — I495 Sick sinus syndrome: Secondary | ICD-10-CM | POA: Diagnosis not present

## 2012-07-16 DIAGNOSIS — M159 Polyosteoarthritis, unspecified: Secondary | ICD-10-CM | POA: Diagnosis not present

## 2012-07-16 DIAGNOSIS — D649 Anemia, unspecified: Secondary | ICD-10-CM | POA: Diagnosis not present

## 2012-07-16 DIAGNOSIS — R55 Syncope and collapse: Secondary | ICD-10-CM | POA: Diagnosis not present

## 2012-07-16 DIAGNOSIS — I495 Sick sinus syndrome: Secondary | ICD-10-CM | POA: Diagnosis not present

## 2012-07-16 DIAGNOSIS — I4891 Unspecified atrial fibrillation: Secondary | ICD-10-CM | POA: Diagnosis not present

## 2012-07-16 DIAGNOSIS — I951 Orthostatic hypotension: Secondary | ICD-10-CM | POA: Diagnosis not present

## 2012-07-18 ENCOUNTER — Telehealth: Payer: Self-pay | Admitting: Cardiology

## 2012-07-18 DIAGNOSIS — M159 Polyosteoarthritis, unspecified: Secondary | ICD-10-CM | POA: Diagnosis not present

## 2012-07-18 DIAGNOSIS — I951 Orthostatic hypotension: Secondary | ICD-10-CM | POA: Diagnosis not present

## 2012-07-18 DIAGNOSIS — I4891 Unspecified atrial fibrillation: Secondary | ICD-10-CM | POA: Diagnosis not present

## 2012-07-18 DIAGNOSIS — R55 Syncope and collapse: Secondary | ICD-10-CM | POA: Diagnosis not present

## 2012-07-18 DIAGNOSIS — I495 Sick sinus syndrome: Secondary | ICD-10-CM | POA: Diagnosis not present

## 2012-07-18 DIAGNOSIS — D649 Anemia, unspecified: Secondary | ICD-10-CM | POA: Diagnosis not present

## 2012-07-18 MED ORDER — ATENOLOL 25 MG PO TABS: 25.0000 mg | ORAL_TABLET | Freq: Every day | ORAL | Status: DC

## 2012-07-18 NOTE — Telephone Encounter (Signed)
Returned call to [facility] from [facility] she stated patient's B/P has been dropping when she stands up.Supine B/P 100/68,118/72 Standing B/P 80/58,98/62.Stated she would like a order for patient to have tele B/P monitor for a few days to check B/P.Spoke to DOD Dr.Brackbill he advised to decrease atenolol to 25 mg daily and ok for tele B/P monitor.

## 2012-07-18 NOTE — Telephone Encounter (Signed)
New problem    Amber from [facility] has concerns about pt's BP and need to know if she need to take Atenolol. They need tele monitoring equipment so her BP can be measured daily. Please call [facility]

## 2012-07-22 DIAGNOSIS — I495 Sick sinus syndrome: Secondary | ICD-10-CM | POA: Diagnosis not present

## 2012-07-22 DIAGNOSIS — D649 Anemia, unspecified: Secondary | ICD-10-CM | POA: Diagnosis not present

## 2012-07-22 DIAGNOSIS — M159 Polyosteoarthritis, unspecified: Secondary | ICD-10-CM | POA: Diagnosis not present

## 2012-07-22 DIAGNOSIS — I951 Orthostatic hypotension: Secondary | ICD-10-CM | POA: Diagnosis not present

## 2012-07-22 DIAGNOSIS — I4891 Unspecified atrial fibrillation: Secondary | ICD-10-CM | POA: Diagnosis not present

## 2012-07-22 DIAGNOSIS — R55 Syncope and collapse: Secondary | ICD-10-CM | POA: Diagnosis not present

## 2012-07-23 DIAGNOSIS — R55 Syncope and collapse: Secondary | ICD-10-CM | POA: Diagnosis not present

## 2012-07-23 DIAGNOSIS — I495 Sick sinus syndrome: Secondary | ICD-10-CM | POA: Diagnosis not present

## 2012-07-23 DIAGNOSIS — D649 Anemia, unspecified: Secondary | ICD-10-CM | POA: Diagnosis not present

## 2012-07-23 DIAGNOSIS — I4891 Unspecified atrial fibrillation: Secondary | ICD-10-CM | POA: Diagnosis not present

## 2012-07-23 DIAGNOSIS — M159 Polyosteoarthritis, unspecified: Secondary | ICD-10-CM | POA: Diagnosis not present

## 2012-07-23 DIAGNOSIS — I951 Orthostatic hypotension: Secondary | ICD-10-CM | POA: Diagnosis not present

## 2012-07-25 ENCOUNTER — Ambulatory Visit (INDEPENDENT_AMBULATORY_CARE_PROVIDER_SITE_OTHER): Payer: Medicare Other | Admitting: Cardiology

## 2012-07-25 ENCOUNTER — Encounter: Payer: Self-pay | Admitting: Cardiology

## 2012-07-25 VITALS — BP 108/70 | HR 63 | Ht 65.0 in | Wt 116.0 lb

## 2012-07-25 DIAGNOSIS — D649 Anemia, unspecified: Secondary | ICD-10-CM | POA: Diagnosis not present

## 2012-07-25 DIAGNOSIS — I4891 Unspecified atrial fibrillation: Secondary | ICD-10-CM

## 2012-07-25 DIAGNOSIS — R55 Syncope and collapse: Secondary | ICD-10-CM

## 2012-07-25 DIAGNOSIS — I951 Orthostatic hypotension: Secondary | ICD-10-CM

## 2012-07-25 DIAGNOSIS — I495 Sick sinus syndrome: Secondary | ICD-10-CM | POA: Diagnosis not present

## 2012-07-25 DIAGNOSIS — M159 Polyosteoarthritis, unspecified: Secondary | ICD-10-CM | POA: Diagnosis not present

## 2012-07-25 NOTE — Patient Instructions (Addendum)
Continue your current therapy. Wear support hose.  Let me know if the nausea persists on Eliquis.  I will see you again in 3 months.

## 2012-07-25 NOTE — Progress Notes (Signed)
Katrina Rodriguez Date of Birth: 01-28-1929 Medical Record #[id number]  History of Present Illness: Katrina Rodriguez is seen today for a followup visit. She has a history of atrial fibrillation. She is on chronic Tikosyn therapy. She has had a previous atrial fibrillation ablation. She has a history of chronic chest pain with normal coronary anatomy by cardiac catheterization. She had a normal nuclear stress test last year. She has a history of recurrent syncope that is multifactorial.  She continues to have difficulty with declining vision. She has recently had 2 hospital visits. The first is she presented with acute chest pain which was related to her pacemaker reaching ERI. He went to backup VVI mode. This caused her chest pain symptoms. She had a generator change out. The following week she had a syncopal episode at home and hit her head on her dresser. She was orthostatic. She apparently had some adjustment made in her pacemaker at the hospital but I cannot find those results. She was maintained on Florinef. She was switched from Coumadin to Eliquis. She does note some nausea since this change was made. She has had no recurrent syncope.  Current Outpatient Prescriptions on File Prior to Visit  Medication Sig Dispense Refill  . apixaban (ELIQUIS) 2.5 MG TABS tablet Take 1 tablet (2.5 mg total) by mouth 2 (two) times daily.  60 tablet  1  . atenolol (TENORMIN) 25 MG tablet Take 1 tablet (25 mg total) by mouth daily.  30 tablet  6  . dofetilide (TIKOSYN) 250 MCG capsule Take 1 capsule (250 mcg total) by mouth 2 (two) times daily.  180 capsule  3  . fludrocortisone (FLORINEF) 0.1 MG tablet Take 2 tablets (0.2 mg) twice a day  360 tablet  3  . potassium chloride SA (K-DUR,KLOR-CON) 20 MEQ tablet Take 40 mEq by mouth daily.       No current facility-administered medications on file prior to visit.    Allergies  Allergen Reactions  . Amiodarone   . Amoxicillin   . Codeine   . Darvocet  (Propoxyphene-Acetaminophen)   . Demerol   . Penicillins     Past Medical History  Diagnosis Date  . Thrombocytopenia   . H/O: CVA (cardiovascular accident)     Right internal capsule  . Orthostasis   . Anticoagulant long-term use   . Pacemaker   . Tachycardia-bradycardia syndrome   . Mild pulmonary hypertension   . Chronic anemia   . H/O syncope   . Chest pain     Has normal coronaries per cath in 2005; negative nuclear in 2010  . Atrial fibrillation     History of ablation - Dr. Johney Frame  . Hyperthyroidism   . Retinal detachment   . High risk medication use     Tikosyn & Coumadin  . Arthritis     Past Surgical History  Procedure Laterality Date  . Cardiac pacemaker placement    . Cardiac catheterization    . Retinal detachment surgery      History  Smoking status  . Never Smoker   Smokeless tobacco  . Never Used    History  Alcohol Use No    Family History  Problem Relation Age of Onset  . Heart disease Father   . Heart disease Brother   . Heart disease Brother     Review of Systems: The review of systems is positive for chronic vision loss. She is still fiercely independent but no longer able to drive. All other systems  were reviewed and are negative.  Physical Exam: BP 108/70  Pulse 63  Ht 5\' 5"  (1.651 m)  Wt 116 lb (52.617 kg)  BMI 19.3 kg/m2 Patient is very pleasant and in no acute distress. Skin is warm and dry. Color is normal.  HEENT is unremarkable. Normocephalic/atraumatic. PERRL. Sclera are nonicteric. Neck is supple. No masses. No JVD. Lungs are clear. Her pacemaker site has healed well. Cardiac exam shows a regular rate and rhythm. Abdomen is soft. Extremities are without edema. Gait and ROM are intact but she is a little unsteady. No gross neurologic deficits noted.   LABORATORY DATA:    Assessment / Plan: 1. Atrial fibrillation. She appears to be maintaining sinus rhythm while on Tikosyn. Status post ablation. With prior CVA she needs  to remain on anticoagulation. Currently on Eliquis 2.5 mg twice a day for convenience. We'll see if she will adjust to this. If her nausea persists we may need to go back to Coumadin or consider Xarelto.  2. Chronic chest pain. Ischemic evaluation has been negative.  3. History of CVA.  4. Sick sinus syndrome status post pacemaker implant. Patient has regular followup in pacemaker clinic.  5. Recurrent syncope, multifactorial. She is on chronic Florinef. I have encouraged her to wear support hose. Maintain good hydration and liberalize sodium intake. I'll followup again in 3 months.

## 2012-07-26 DIAGNOSIS — I495 Sick sinus syndrome: Secondary | ICD-10-CM | POA: Diagnosis not present

## 2012-07-26 DIAGNOSIS — D649 Anemia, unspecified: Secondary | ICD-10-CM | POA: Diagnosis not present

## 2012-07-26 DIAGNOSIS — I4891 Unspecified atrial fibrillation: Secondary | ICD-10-CM | POA: Diagnosis not present

## 2012-07-26 DIAGNOSIS — I951 Orthostatic hypotension: Secondary | ICD-10-CM | POA: Diagnosis not present

## 2012-07-26 DIAGNOSIS — M159 Polyosteoarthritis, unspecified: Secondary | ICD-10-CM | POA: Diagnosis not present

## 2012-07-26 DIAGNOSIS — R55 Syncope and collapse: Secondary | ICD-10-CM | POA: Diagnosis not present

## 2012-07-31 ENCOUNTER — Telehealth: Payer: Self-pay | Admitting: Cardiology

## 2012-07-31 NOTE — Telephone Encounter (Signed)
New problem   Pt stated Eliquis is upsetting her stomach. Please call pt.

## 2012-07-31 NOTE — Telephone Encounter (Signed)
Switch to Xarelto 15 mg daily with evening meal. Stop Eliquis.  Shalamar Plourde [place] MD, [facility]

## 2012-07-31 NOTE — Telephone Encounter (Signed)
Returned call to patient stated she continues to be nausea after taking eliquis.Dr.Jordan's last office note says if nausea continues could try xarelto.Message sent to Dr.Jordan for advice.

## 2012-08-01 DIAGNOSIS — I4891 Unspecified atrial fibrillation: Secondary | ICD-10-CM | POA: Diagnosis not present

## 2012-08-01 DIAGNOSIS — R55 Syncope and collapse: Secondary | ICD-10-CM | POA: Diagnosis not present

## 2012-08-01 MED ORDER — RIVAROXABAN 15 MG PO TABS: ORAL_TABLET | ORAL | Status: DC

## 2012-08-01 NOTE — Addendum Note (Signed)
Addended by: Meda Klinefelter D on: 08/01/2012 03:07 PM   Modules accepted: Orders, Medications

## 2012-08-01 NOTE — Telephone Encounter (Signed)
Spoke to patient Dr.Jordan advised ok to stop eliquis and start xarelto 15 mg.daily with evening meal.Prescription sent to pharmacy.Patient advised to call back if needed.

## 2012-08-01 NOTE — Telephone Encounter (Signed)
Returned call to patient no answer.LMTC. 

## 2012-08-02 ENCOUNTER — Telehealth: Payer: Self-pay | Admitting: Cardiology

## 2012-08-02 NOTE — Telephone Encounter (Signed)
Returned call to patient Katrina Rodriguez was sent to pharmacy 08/01/12.Prior authorization was obtained.[facility] called [phone].ID # [id number].Patient will start xarelto 15 mg daily.Stop eliquis.Advised to call back if needed.

## 2012-08-02 NOTE — Telephone Encounter (Signed)
Follow-up:    Patient called in because she is feeling sick on her stomach due to her medication and would like to know how to proceed.  Please call back.

## 2012-08-15 ENCOUNTER — Ambulatory Visit (INDEPENDENT_AMBULATORY_CARE_PROVIDER_SITE_OTHER): Payer: Medicare Other | Admitting: *Deleted

## 2012-08-15 DIAGNOSIS — I4891 Unspecified atrial fibrillation: Secondary | ICD-10-CM

## 2012-08-15 DIAGNOSIS — I495 Sick sinus syndrome: Secondary | ICD-10-CM | POA: Diagnosis not present

## 2012-08-15 LAB — PACEMAKER DEVICE OBSERVATION
AL AMPLITUDE: 1.4 mv
AL THRESHOLD: 7.5 V
ATRIAL PACING PM: 22
BATTERY VOLTAGE: 2.79 V
BMOD-0005RA: 95 {beats}/min

## 2012-08-15 NOTE — Progress Notes (Signed)
PPM check in office. 

## 2012-08-19 ENCOUNTER — Telehealth: Payer: Self-pay | Admitting: Cardiology

## 2012-08-19 NOTE — Telephone Encounter (Signed)
Returned call to patient she stated she cannot take xarelto.Stated causes nausea and diarrhea.Dr.Jordan out of office today will check with him 08/20/12 and call her back.

## 2012-08-19 NOTE — Telephone Encounter (Signed)
New problem  Pt states her BP medicines are still making her sick.

## 2012-08-21 NOTE — Telephone Encounter (Signed)
Message sent to Dr.Jordan for advice. 

## 2012-08-21 NOTE — Telephone Encounter (Signed)
Returned call to patient Dr.Jordan advised changing to elquis.Patient stated eliquis caused nausea too.Stated she will take xarelto at bedtime with a snack.Stated she will call back next week and let me know how she is doing.

## 2012-08-21 NOTE — Telephone Encounter (Signed)
We can try switching her to Eliquis 2.5 mg bid. We can give her samples to make sure she can tolerate. She definitely needs to stay on anticoagulant.  Amyjo Mizrachi [place] MD, [facility]

## 2012-08-26 ENCOUNTER — Telehealth: Payer: Self-pay | Admitting: *Deleted

## 2012-08-26 ENCOUNTER — Telehealth: Payer: Self-pay | Admitting: Cardiology

## 2012-08-26 NOTE — Telephone Encounter (Signed)
Spoke with pt per Pharm D pt will overlap Xarelto for 3 days with coumadin. She will resume previous dose of coumadin and come in on Monday for INR.

## 2012-08-26 NOTE — Telephone Encounter (Signed)
New Prob     Pt states she can not tolerate XARELTO and ELIQUIS. Would like to speak to nurse.

## 2012-08-26 NOTE — Telephone Encounter (Signed)
Spoke with patient who c/o nausea since taking Xarelto for 1 week.  Patient wants to know if Dr. [place] wants her back on warfarin since it does not cause nausea. I advised patient that I will send message to Dr. [place] for advice.  Patient states she has not skipped a dose of Xarelto and will continue to take it until she hears back from [place].

## 2012-08-26 NOTE — Telephone Encounter (Signed)
Attempting to return patient's call.  Phone is busy

## 2012-08-26 NOTE — Telephone Encounter (Signed)
OK to switch back to coumadin. Will need coumadin clinic to manage transition.  Rita Vialpando [place] MD, [facility]

## 2012-08-27 NOTE — Telephone Encounter (Signed)
Returned call to patient she stated she already has appointment with coumadin clinic 09/02/12 at 12:30 pm.

## 2012-09-02 ENCOUNTER — Ambulatory Visit (INDEPENDENT_AMBULATORY_CARE_PROVIDER_SITE_OTHER): Payer: Medicare Other | Admitting: *Deleted

## 2012-09-02 DIAGNOSIS — Z7901 Long term (current) use of anticoagulants: Secondary | ICD-10-CM

## 2012-09-02 DIAGNOSIS — I4891 Unspecified atrial fibrillation: Secondary | ICD-10-CM

## 2012-09-02 LAB — POCT INR: INR: 2.6

## 2012-09-05 ENCOUNTER — Encounter: Payer: Self-pay | Admitting: Internal Medicine

## 2012-09-16 ENCOUNTER — Ambulatory Visit (INDEPENDENT_AMBULATORY_CARE_PROVIDER_SITE_OTHER): Payer: Medicare Other | Admitting: Internal Medicine

## 2012-09-16 ENCOUNTER — Encounter: Payer: Self-pay | Admitting: Internal Medicine

## 2012-09-16 ENCOUNTER — Ambulatory Visit (INDEPENDENT_AMBULATORY_CARE_PROVIDER_SITE_OTHER): Payer: Medicare Other | Admitting: *Deleted

## 2012-09-16 VITALS — BP 110/70 | HR 77 | Ht 65.0 in | Wt 116.2 lb

## 2012-09-16 DIAGNOSIS — I4891 Unspecified atrial fibrillation: Secondary | ICD-10-CM

## 2012-09-16 DIAGNOSIS — I495 Sick sinus syndrome: Secondary | ICD-10-CM | POA: Diagnosis not present

## 2012-09-16 DIAGNOSIS — R42 Dizziness and giddiness: Secondary | ICD-10-CM | POA: Diagnosis not present

## 2012-09-16 DIAGNOSIS — Z7901 Long term (current) use of anticoagulants: Secondary | ICD-10-CM | POA: Diagnosis not present

## 2012-09-16 DIAGNOSIS — Z950 Presence of cardiac pacemaker: Secondary | ICD-10-CM

## 2012-09-16 DIAGNOSIS — I951 Orthostatic hypotension: Secondary | ICD-10-CM

## 2012-09-16 LAB — PACEMAKER DEVICE OBSERVATION
AL THRESHOLD: 2 V
ATRIAL PACING PM: 52.5
BRDY-0002RA: 60 {beats}/min

## 2012-09-16 LAB — CBC WITH DIFFERENTIAL/PLATELET
Basophils Absolute: 0 10*3/uL (ref 0.0–0.1)
Eosinophils Absolute: 0.1 10*3/uL (ref 0.0–0.7)
HCT: 43.7 % (ref 36.0–46.0)
Hemoglobin: 14.6 g/dL (ref 12.0–15.0)
Lymphocytes Relative: 27.6 % (ref 12.0–46.0)
Lymphs Abs: 1.4 10*3/uL (ref 0.7–4.0)
MCHC: 33.4 g/dL (ref 30.0–36.0)
MCV: 97.1 fl (ref 78.0–100.0)
Monocytes Absolute: 0.4 10*3/uL (ref 0.1–1.0)
Neutro Abs: 3 10*3/uL (ref 1.4–7.7)
RDW: 13.3 % (ref 11.5–14.6)

## 2012-09-16 LAB — MAGNESIUM: Magnesium: 2.5 mg/dL (ref 1.5–2.5)

## 2012-09-16 LAB — BASIC METABOLIC PANEL
BUN: 20 mg/dL (ref 6–23)
Calcium: 8.9 mg/dL (ref 8.4–10.5)
Creatinine, Ser: 1.2 mg/dL (ref 0.4–1.2)
GFR: 46.13 mL/min — ABNORMAL LOW (ref >60.00)

## 2012-09-16 LAB — TSH: TSH: 1.04 u[IU]/mL (ref 0.35–5.50)

## 2012-09-16 MED ORDER — WARFARIN SODIUM 4 MG PO TABS: ORAL_TABLET | ORAL | Status: DC

## 2012-09-16 NOTE — Patient Instructions (Addendum)
Your physician recommends that you schedule a follow-up appointment in: 3 months with Dr Johney Frame   Your physician recommends that you return for lab work CBC, BMP, MAG, TSH, Free T4  Your physician has recommended you make the following change in your medication:  1) Stop Atenolol--only take as needed

## 2012-09-16 NOTE — Progress Notes (Signed)
PCP: Allean Found, MD Primary Cardiologist: Peter [place], MD  Katrina Rodriguez is a 79 y.o. female who presents today for routine electrophysiology followup.  Since last being seen in our clinic, the patient has had persistent episodes of postural syncope and pre-syncope.  She states that at home her blood pressure is at times in the 70's.  She feels better when she eats salty foods and her blood pressure increases.  Since generator change in April of this year, she has been found to have atrial lead noise and increased impedence.  Her device was reprogrammed ADI 40 at her last office visit because of atrial non capture.    Today, she denies symptoms of palpitations, chest pain, shortness of breath, or  lower extremity edema.  The patient is otherwise without complaint today.   Past Medical History  Diagnosis Date  . Thrombocytopenia   . H/O: CVA (cardiovascular accident)     Right internal capsule  . Orthostasis   . Anticoagulant long-term use   . Pacemaker   . Tachycardia-bradycardia syndrome   . Mild pulmonary hypertension   . Chronic anemia   . H/O syncope   . Chest pain     Has normal coronaries per cath in 2005; negative nuclear in 2010  . Atrial fibrillation     History of ablation - Dr. Johney Frame  . Hyperthyroidism   . Retinal detachment   . High risk medication use     Tikosyn & Coumadin  . Arthritis    Past Surgical History  Procedure Laterality Date  . Cardiac pacemaker placement    . Cardiac catheterization    . Retinal detachment surgery      Current Outpatient Prescriptions  Medication Sig Dispense Refill  . atenolol (TENORMIN) 25 MG tablet Take 1 tablet (25 mg total) by mouth daily.  30 tablet  6  . dofetilide (TIKOSYN) 250 MCG capsule Take 1 capsule (250 mcg total) by mouth 2 (two) times daily.  180 capsule  3  . fludrocortisone (FLORINEF) 0.1 MG tablet Take 2 tablets (0.2 mg) twice a day  360 tablet  3  . potassium chloride SA (K-DUR,KLOR-CON) 20 MEQ tablet  Take 40 mEq by mouth daily.       No current facility-administered medications for this visit.    Physical Exam: Filed Vitals:   09/16/12 1047 09/16/12 1121 09/16/12 1122  BP: 100/60 110/74 102/76  Pulse: 54 57 57  Height: 5\' 5"  (1.651 m)    Weight: 116 lb 3.2 oz (52.708 kg)    SpO2:  98% 98%    GEN- The patient is well appearing, alert and oriented x 3 today.   Head- normocephalic, atraumatic Eyes-  Sclera clear, conjunctiva pink Ears- hearing intact Oropharynx- clear Lungs- Clear to ausculation bilaterally, normal work of breathing Chest- pacemaker pocket is well healed Heart- Regular rate and rhythm, no murmurs, rubs or gallops, PMI not laterally displaced GI- soft, NT, ND, + BS Extremities- no clubbing, cyanosis, or edema  Pacemaker interrogation- reviewed in detail today,  See PACEART report  Assessment and Plan:  1. Postural dizziness/ presyncope/ syncope She is orthostatic by HR today.  This is chronic for her.   Today, I will stop atenolol Continue florinef Oral hydration is encouraged  2. Bradycardia Stop atenolol She is reprogrammed with higher atrial pacing output today.   Return in 3 months to reassess.  3. HTN Stable No change required today  4. afib Continue coumadin  Return to see me in 3 months

## 2012-10-25 ENCOUNTER — Ambulatory Visit (INDEPENDENT_AMBULATORY_CARE_PROVIDER_SITE_OTHER): Payer: Medicare Other | Admitting: Pharmacist

## 2012-10-25 ENCOUNTER — Encounter: Payer: Self-pay | Admitting: Cardiology

## 2012-10-25 ENCOUNTER — Ambulatory Visit (INDEPENDENT_AMBULATORY_CARE_PROVIDER_SITE_OTHER): Payer: Medicare Other | Admitting: Cardiology

## 2012-10-25 VITALS — BP 122/78 | HR 60 | Ht 65.0 in | Wt 113.1 lb

## 2012-10-25 DIAGNOSIS — Z7901 Long term (current) use of anticoagulants: Secondary | ICD-10-CM | POA: Diagnosis not present

## 2012-10-25 DIAGNOSIS — R55 Syncope and collapse: Secondary | ICD-10-CM

## 2012-10-25 DIAGNOSIS — I4891 Unspecified atrial fibrillation: Secondary | ICD-10-CM | POA: Diagnosis not present

## 2012-10-25 DIAGNOSIS — I951 Orthostatic hypotension: Secondary | ICD-10-CM

## 2012-10-25 DIAGNOSIS — I495 Sick sinus syndrome: Secondary | ICD-10-CM

## 2012-10-25 NOTE — Patient Instructions (Signed)
Only take atenolol if your blood pressure stays high for a couple of hours.  Continue your other therapy.  I will see you in 4 months.

## 2012-10-25 NOTE — Progress Notes (Signed)
Katrina Rodriguez Date of Birth: Apr 24, 1928 Medical Record #[id number]  History of Present Illness: Katrina Rodriguez is seen today for a followup visit. She has a history of atrial fibrillation. She is on chronic Tikosyn therapy. She has had a previous atrial fibrillation ablation. She has a history of chronic chest pain with normal coronary anatomy by cardiac catheterization. She had a normal nuclear stress test 2/13. She has a history of recurrent syncope that is multifactorial.  She continues to have difficulty with declining vision. Recently she has experienced increased symptoms of postural near-syncope and syncope. She has suffered no injury. At times her blood pressure would shoot way up and then when she stands up it would drop. Her atenolol was discontinued.  Current Outpatient Prescriptions on File Prior to Visit  Medication Sig Dispense Refill  . dofetilide (TIKOSYN) 250 MCG capsule Take 1 capsule (250 mcg total) by mouth 2 (two) times daily.  180 capsule  3  . fludrocortisone (FLORINEF) 0.1 MG tablet Take 2 tablets (0.2 mg) twice a day  360 tablet  3  . potassium chloride SA (K-DUR,KLOR-CON) 20 MEQ tablet Take 40 mEq by mouth daily.      Marland Kitchen warfarin (COUMADIN) 4 MG tablet Take as directed       No current facility-administered medications on file prior to visit.    Allergies  Allergen Reactions  . Amiodarone   . Amoxicillin   . Codeine   . Darvocet [Propoxyphene-Acetaminophen]   . Demerol   . Penicillins     Past Medical History  Diagnosis Date  . Thrombocytopenia   . H/O: CVA (cardiovascular accident)     Right internal capsule  . Orthostasis   . Anticoagulant long-term use   . Pacemaker   . Tachycardia-bradycardia syndrome   . Mild pulmonary hypertension   . Chronic anemia   . H/O syncope   . Chest pain     Has normal coronaries per cath in 2005; negative nuclear in 2010  . Atrial fibrillation     History of ablation - Dr. Johney Frame  . Hyperthyroidism   . Retinal detachment    . High risk medication use     Tikosyn & Coumadin  . Arthritis     Past Surgical History  Procedure Laterality Date  . Cardiac pacemaker placement    . Cardiac catheterization    . Retinal detachment surgery      History  Smoking status  . Never Smoker   Smokeless tobacco  . Never Used    History  Alcohol Use No    Family History  Problem Relation Age of Onset  . Heart disease Father   . Heart disease Brother   . Heart disease Brother     Review of Systems: The review of systems is positive for chronic vision loss.  All other systems were reviewed and are negative.  Physical Exam: BP 122/78  Pulse 60  Ht 5\' 5"  (1.651 m)  Wt 113 lb 1.9 oz (51.311 kg)  BMI 18.82 kg/m2 Patient is very pleasant and in no acute distress. Skin is warm and dry. Color is normal.  HEENT is unremarkable. Normocephalic/atraumatic. PERRL. Sclera are nonicteric. Neck is supple. No masses. No JVD. Lungs are clear. Her pacemaker site has healed well. Cardiac exam shows a regular rate and rhythm. Abdomen is soft. Extremities are without edema. Gait and ROM are intact but she is a little unsteady. No gross neurologic deficits noted.   LABORATORY DATA:  Lab Results  Component  Value Date   WBC 4.9 09/16/2012   HGB 14.6 09/16/2012   HCT 43.7 09/16/2012   PLT 154.0 09/16/2012   GLUCOSE 96 09/16/2012   CHOL 183 06/12/2012   TRIG 103 06/12/2012   HDL 49 06/12/2012   LDLCALC 409* 06/12/2012   ALT 11 12/21/2009   AST 18 12/21/2009   NA 140 09/16/2012   K 4.5 09/16/2012   CL 104 09/16/2012   CREATININE 1.2 09/16/2012   BUN 20 09/16/2012   CO2 29 09/16/2012   TSH 1.04 09/16/2012   INR 2.7 09/16/2012   HGBA1C 5.8* 06/12/2012   ECG today demonstrates an atrial paced rhythm at a rate of 60 beats per minute. QTC is 500 ms. This is stable.  Assessment / Plan: 1. Atrial fibrillation. She appears to be maintaining sinus rhythm while on Tikosyn. Status post ablation. With prior CVA she needs to remain on  anticoagulation. Currently on Coumadin. She was unable to afford the novel anticoagulants.  2. Chronic chest pain. Ischemic evaluation has been negative.  3. History of CVA.  4. Sick sinus syndrome status post pacemaker implant. Patient has regular followup in pacemaker clinic.  5. Recurrent syncope, multifactorial. She is on chronic Florinef. I have encouraged her to wear support hose. Maintain good hydration and liberalize sodium intake. She should not take atenolol unless her blood pressure is significantly elevated for over 2 hours.

## 2012-11-11 DIAGNOSIS — H4720 Unspecified optic atrophy: Secondary | ICD-10-CM | POA: Diagnosis not present

## 2012-11-11 DIAGNOSIS — Z961 Presence of intraocular lens: Secondary | ICD-10-CM | POA: Diagnosis not present

## 2012-11-11 DIAGNOSIS — H01029 Squamous blepharitis unspecified eye, unspecified eyelid: Secondary | ICD-10-CM | POA: Diagnosis not present

## 2012-11-11 DIAGNOSIS — H10029 Other mucopurulent conjunctivitis, unspecified eye: Secondary | ICD-10-CM | POA: Diagnosis not present

## 2012-11-29 ENCOUNTER — Ambulatory Visit (INDEPENDENT_AMBULATORY_CARE_PROVIDER_SITE_OTHER): Payer: Medicare Other

## 2012-11-29 DIAGNOSIS — Z7901 Long term (current) use of anticoagulants: Secondary | ICD-10-CM | POA: Diagnosis not present

## 2012-11-29 DIAGNOSIS — I4891 Unspecified atrial fibrillation: Secondary | ICD-10-CM

## 2012-12-18 DIAGNOSIS — H353190 Nonexudative age-related macular degeneration, unspecified eye, stage unspecified: Secondary | ICD-10-CM | POA: Diagnosis not present

## 2012-12-18 DIAGNOSIS — H4720 Unspecified optic atrophy: Secondary | ICD-10-CM | POA: Diagnosis not present

## 2012-12-18 DIAGNOSIS — D3130 Benign neoplasm of unspecified choroid: Secondary | ICD-10-CM | POA: Diagnosis not present

## 2012-12-18 DIAGNOSIS — Z961 Presence of intraocular lens: Secondary | ICD-10-CM | POA: Diagnosis not present

## 2012-12-25 ENCOUNTER — Ambulatory Visit (INDEPENDENT_AMBULATORY_CARE_PROVIDER_SITE_OTHER): Payer: Medicare Other | Admitting: Internal Medicine

## 2012-12-25 ENCOUNTER — Encounter: Payer: Self-pay | Admitting: Internal Medicine

## 2012-12-25 ENCOUNTER — Ambulatory Visit (INDEPENDENT_AMBULATORY_CARE_PROVIDER_SITE_OTHER): Payer: Medicare Other | Admitting: *Deleted

## 2012-12-25 ENCOUNTER — Encounter (INDEPENDENT_AMBULATORY_CARE_PROVIDER_SITE_OTHER): Payer: Self-pay

## 2012-12-25 VITALS — BP 124/72 | HR 68 | Ht 65.0 in | Wt 114.0 lb

## 2012-12-25 DIAGNOSIS — Z7901 Long term (current) use of anticoagulants: Secondary | ICD-10-CM | POA: Diagnosis not present

## 2012-12-25 DIAGNOSIS — I4891 Unspecified atrial fibrillation: Secondary | ICD-10-CM | POA: Diagnosis not present

## 2012-12-25 DIAGNOSIS — I951 Orthostatic hypotension: Secondary | ICD-10-CM | POA: Diagnosis not present

## 2012-12-25 DIAGNOSIS — Z950 Presence of cardiac pacemaker: Secondary | ICD-10-CM

## 2012-12-25 DIAGNOSIS — I495 Sick sinus syndrome: Secondary | ICD-10-CM | POA: Diagnosis not present

## 2012-12-25 LAB — PACEMAKER DEVICE OBSERVATION
ATRIAL PACING PM: 61
BMOD-0003RA: 30
BRDY-0002RA: 60 {beats}/min

## 2012-12-25 NOTE — Progress Notes (Signed)
PCP: Allean Found, MD Primary Cardiologist: Peter Swaziland, MD  Katrina Rodriguez is a 79 y.o. female who presents today for routine electrophysiology followup.  Since last being seen in our clinic, the patient has had persistent episodes of postural syncope and pre-syncope.    Today, she denies symptoms of palpitations, chest pain, shortness of breath, or  lower extremity edema.  The patient is otherwise without complaint today.   Past Medical History  Diagnosis Date  . Thrombocytopenia   . H/O: CVA (cardiovascular accident)     Right internal capsule  . Orthostasis   . Anticoagulant long-term use   . Pacemaker   . Tachycardia-bradycardia syndrome   . Mild pulmonary hypertension   . Chronic anemia   . H/O syncope   . Chest pain     Has normal coronaries per cath in 2005; negative nuclear in 2010  . Atrial fibrillation     History of ablation - Dr. Johney Frame  . Hyperthyroidism   . Retinal detachment   . High risk medication use     Tikosyn & Coumadin  . Arthritis    Past Surgical History  Procedure Laterality Date  . Cardiac pacemaker placement    . Cardiac catheterization    . Retinal detachment surgery      Current Outpatient Prescriptions  Medication Sig Dispense Refill  . dofetilide (TIKOSYN) 250 MCG capsule Take 1 capsule (250 mcg total) by mouth 2 (two) times daily.  180 capsule  3  . fludrocortisone (FLORINEF) 0.1 MG tablet Take 2 tablets (0.2 mg) twice a day  360 tablet  3  . potassium chloride SA (K-DUR,KLOR-CON) 20 MEQ tablet Take 40 mEq by mouth daily.      Marland Kitchen warfarin (COUMADIN) 4 MG tablet Take as directed       No current facility-administered medications for this visit.    Physical Exam: Filed Vitals:   12/25/12 0957  BP: 124/72  Pulse: 68  Height: 5\' 5"  (1.651 m)  Weight: 114 lb (51.71 kg)    GEN- The patient is well appearing, alert and oriented x 3 today.   Head- normocephalic, atraumatic Eyes-  Sclera clear, conjunctiva pink Ears- hearing  intact Oropharynx- clear Lungs- Clear to ausculation bilaterally, normal work of breathing Chest- pacemaker pocket is well healed Heart- Regular rate and rhythm, no murmurs, rubs or gallops, PMI not laterally displaced GI- soft, NT, ND, + BS Extremities- no clubbing, cyanosis, or edema  Pacemaker interrogation- reviewed in detail today,  See PACEART report  Assessment and Plan:  1. Postural dizziness/ presyncope/ syncope Continue florinef Oral hydration is encouraged Support hose are encouraged though she is reluctant to wear them Could consider midodrine, though I am not sure that she would tolerate this  2. Bradycardia Stop atenolol Atrial threshold/ noise appear much better.  Today, A threshold is < 1 V.  Continue with current ADI programming.  3. HTN Stable No change required today  4. afib Continue coumadin  [facility] Return to see me in 6 months

## 2012-12-25 NOTE — Patient Instructions (Signed)
Your physician wants you to follow-up in: 06/2013 with Dr Johney Frame Bonita Quin will receive a reminder letter in the mail two months in advance. If you don't receive a letter, please call our office to schedule the follow-up appointment.    Remote monitoring is used to monitor your Pacemaker or ICD from home. This monitoring reduces the number of office visits required to check your device to one time per year. It allows [place] to keep an eye on the functioning of your device to ensure it is working properly. You are scheduled for a device check from home on 03/28/13. You may send your transmission at any time that day. If you have a wireless device, the transmission will be sent automatically. After your physician reviews your transmission, you will receive a postcard with your next transmission date.

## 2013-01-01 ENCOUNTER — Telehealth: Payer: Self-pay

## 2013-01-01 NOTE — Telephone Encounter (Signed)
pharm called wanting to know if it was ok to use the coumdian from another [employer]

## 2013-01-06 ENCOUNTER — Ambulatory Visit (INDEPENDENT_AMBULATORY_CARE_PROVIDER_SITE_OTHER): Payer: Medicare Other | Admitting: General Practice

## 2013-01-06 DIAGNOSIS — I4891 Unspecified atrial fibrillation: Secondary | ICD-10-CM | POA: Diagnosis not present

## 2013-01-06 DIAGNOSIS — Z7901 Long term (current) use of anticoagulants: Secondary | ICD-10-CM | POA: Diagnosis not present

## 2013-01-27 ENCOUNTER — Ambulatory Visit (INDEPENDENT_AMBULATORY_CARE_PROVIDER_SITE_OTHER): Payer: Medicare Other | Admitting: General Practice

## 2013-01-27 DIAGNOSIS — I4891 Unspecified atrial fibrillation: Secondary | ICD-10-CM

## 2013-01-27 DIAGNOSIS — Z7901 Long term (current) use of anticoagulants: Secondary | ICD-10-CM

## 2013-02-20 ENCOUNTER — Ambulatory Visit (INDEPENDENT_AMBULATORY_CARE_PROVIDER_SITE_OTHER): Payer: Medicare Other | Admitting: Physician Assistant

## 2013-02-20 ENCOUNTER — Ambulatory Visit (INDEPENDENT_AMBULATORY_CARE_PROVIDER_SITE_OTHER): Payer: Medicare Other | Admitting: Pharmacist

## 2013-02-20 ENCOUNTER — Encounter: Payer: Self-pay | Admitting: Physician Assistant

## 2013-02-20 ENCOUNTER — Ambulatory Visit: Payer: Medicare Other | Admitting: Cardiology

## 2013-02-20 VITALS — BP 132/82 | HR 60 | Ht 65.0 in | Wt 115.0 lb

## 2013-02-20 DIAGNOSIS — J069 Acute upper respiratory infection, unspecified: Secondary | ICD-10-CM

## 2013-02-20 DIAGNOSIS — Z7901 Long term (current) use of anticoagulants: Secondary | ICD-10-CM

## 2013-02-20 DIAGNOSIS — I4891 Unspecified atrial fibrillation: Secondary | ICD-10-CM

## 2013-02-20 DIAGNOSIS — R55 Syncope and collapse: Secondary | ICD-10-CM

## 2013-02-20 DIAGNOSIS — I495 Sick sinus syndrome: Secondary | ICD-10-CM

## 2013-02-20 DIAGNOSIS — I951 Orthostatic hypotension: Secondary | ICD-10-CM

## 2013-02-20 DIAGNOSIS — Z950 Presence of cardiac pacemaker: Secondary | ICD-10-CM

## 2013-02-20 LAB — BASIC METABOLIC PANEL
Calcium: 8.6 mg/dL (ref 8.4–10.5)
Creatinine, Ser: 1.1 mg/dL (ref 0.4–1.2)
GFR: 50.99 mL/min — ABNORMAL LOW (ref >60.00)
Glucose, Bld: 96 mg/dL (ref 70–99)
Sodium: 139 mEq/L (ref 135–145)

## 2013-02-20 NOTE — Patient Instructions (Addendum)
Your physician recommends that you continue on your current medications as directed. Please refer to the Current Medication list given to you today.  Labs today: bmet  Your physician wants you to follow-up in: 4 months with Dr.Jordan You will receive a reminder letter in the mail two months in advance. If you don't receive a letter, please call our office to schedule the follow-up appointment.    The following medications are ok to take for your cold symptoms:  Robitussin DM  Claritin  Tylenol  Drink plenty of fluids. Eat some chicken noodle soup or tomato soup. Add salt to your food. Stand slowly when getting up. Wear your compression hose.  Follow up with Allean Found, MD if your cold symptoms get worse.

## 2013-02-20 NOTE — Progress Notes (Signed)
[address] [place], [place]  [address] Phone: [phone] Fax:  [phone]  Date:  02/20/2013   ID:  Katrina Rodriguez, DOB January 30, 1929, MRN [id number]  PCP:  Allean Found, MD  Cardiologist:  Dr. Peter Swaziland   Electrophysiologist:  Dr. Hillis Range    History of Present Illness: Katrina Rodriguez is a 79 y.o. female with a history of paroxysmal atrial fibrillation, tachycardia-bradycardia syndrome, status post pacemaker, orthostatic hypotension controlled on Florinef, prior stroke.  LHC (03/2003):  No significant CAD, EF 65%.  LexiScan Myoview (04/2011):  EF 80%, no ischemia, normal study.  Echo (10/2011):  EF 55-60%, mild AI, mild MR, PASP 31.   Last seen by Dr. Swaziland 10/2012. Last seen by Dr. Johney Frame 12/2012.  She is here alone today.  She has overall been doing well.  She continues to have orthostatic intolerance.  She has developed URI symptoms over the last 2 days.  She has had a non-productive cough and low grade temp.  No sore throat, chills, nausea, vomiting.  She has chronic chest pain without change.  No significant dyspnea.  No orthopnea, PND, edema.  She has occasional palps.  She notes worsening orthostasis when she is ill.  She did have an episode of syncope yesterday.  This was similar to previous episodes.  She denies any injury.   Recent Labs: 06/11/2012: Pro B Natriuretic peptide (BNP) 506.9*  06/12/2012: HDL 49; LDL (calc) 113*  09/16/2012: Creatinine 1.2; Hemoglobin 14.6; Potassium 4.5; TSH 1.04   Wt Readings from Last 3 Encounters:  12/25/12 114 lb (51.71 kg)  10/25/12 113 lb 1.9 oz (51.311 kg)  09/16/12 116 lb 3.2 oz (52.708 kg)     Past Medical History  Diagnosis Date  . Thrombocytopenia   . H/O: CVA (cardiovascular accident)     Right internal capsule  . Orthostasis   . Anticoagulant long-term use   . Pacemaker   . Tachycardia-bradycardia syndrome   . Mild pulmonary hypertension   . Chronic anemia   . H/O syncope   . Chest pain     Has normal  coronaries per cath in 2005; negative nuclear in 2010  . Atrial fibrillation     History of ablation - Dr. Johney Frame  . Hyperthyroidism   . Retinal detachment   . High risk medication use     Tikosyn & Coumadin  . Arthritis     Current Outpatient Prescriptions  Medication Sig Dispense Refill  . dofetilide (TIKOSYN) 250 MCG capsule Take 1 capsule (250 mcg total) by mouth 2 (two) times daily.  180 capsule  3  . fludrocortisone (FLORINEF) 0.1 MG tablet Take 2 tablets (0.2 mg) twice a day  360 tablet  3  . potassium chloride SA (K-DUR,KLOR-CON) 20 MEQ tablet Take 40 mEq by mouth daily.      Marland Kitchen warfarin (COUMADIN) 4 MG tablet Take as directed       No current facility-administered medications for this visit.    Allergies:   Amiodarone; Amoxicillin; Codeine; Darvocet; Demerol; and Penicillins   Social History:  The patient  reports that she has never smoked. She has never used smokeless tobacco. She reports that she does not drink alcohol or use illicit drugs.   Family History:  The patient's family history includes Heart disease in her brother, brother, and father.   ROS:  Please see the history of present illness.      All other systems reviewed and negative.   PHYSICAL EXAM: VS:  BP 132/82  Pulse 60  Ht 5\' 5"  (1.651 m)  Wt 115 lb (52.164 kg)  BMI 19.14 kg/m2 Well nourished, well developed, in no acute distress HEENT: normal, oropharynx pink without exudate Lymph: no adenopathy Neck: no JVD Cardiac:  normal S1, S2; RRR; no murmur Lungs:  clear to auscultation bilaterally, no wheezing, rhonchi or rales Abd: soft, nontender, no hepatomegaly Ext: no edema Skin: warm and dry Neuro:  CNs 2-12 intact, no focal abnormalities noted  EKG:  A-paced, HR 60     ASSESSMENT AND PLAN:  1. Syncope:  This is secondary to orthostatic hypotension.  She remains on Florinef.  We discussed the importance of hydration, wearing compression stockings and liberalizing her salt intake.   2. Upper  Respiratory Tract Infection:  This appears to be viral.  We discussed conservative measures for treatment.  She should seek earlier follow up with primary care if she feels worse.  3. Atrial Fibrillation:  Maintaining NSR.  She is tolerating coumadin.  She remains on Tikosyn.  Check BMET and magnesium today.   4. S/p Pacemaker:  F/u with Dr. Hillis Range as planned.  5. Disposition:  F/u with Dr. Peter Swaziland in 4 mos.   Signed, Tereso Newcomer, PA-C  02/20/2013 9:11 AM

## 2013-03-28 ENCOUNTER — Ambulatory Visit (INDEPENDENT_AMBULATORY_CARE_PROVIDER_SITE_OTHER): Payer: Medicare Other | Admitting: *Deleted

## 2013-03-28 DIAGNOSIS — I495 Sick sinus syndrome: Secondary | ICD-10-CM | POA: Diagnosis not present

## 2013-03-28 DIAGNOSIS — I4891 Unspecified atrial fibrillation: Secondary | ICD-10-CM

## 2013-03-29 LAB — MDC_IDC_ENUM_SESS_TYPE_REMOTE
Battery Impedance: 100 Ohm
Battery Voltage: 2.79 V
Date Time Interrogation Session: 20150123131507
Lead Channel Impedance Value: 1247 Ohm
Lead Channel Setting Sensing Sensitivity: 5.6 mV
MDC IDC MSMT BATTERY REMAINING LONGEVITY: 153 mo
MDC IDC MSMT LEADCHNL RV IMPEDANCE VALUE: 67 Ohm
MDC IDC SET LEADCHNL RA PACING AMPLITUDE: 2.5 V
MDC IDC STAT BRADY RA PERCENT PACED: 55 %

## 2013-03-31 ENCOUNTER — Ambulatory Visit (INDEPENDENT_AMBULATORY_CARE_PROVIDER_SITE_OTHER): Payer: Medicare Other | Admitting: *Deleted

## 2013-03-31 DIAGNOSIS — Z7901 Long term (current) use of anticoagulants: Secondary | ICD-10-CM | POA: Diagnosis not present

## 2013-03-31 DIAGNOSIS — I4891 Unspecified atrial fibrillation: Secondary | ICD-10-CM | POA: Diagnosis not present

## 2013-03-31 LAB — POCT INR: INR: 2.7

## 2013-04-07 ENCOUNTER — Encounter: Payer: Self-pay | Admitting: *Deleted

## 2013-04-14 ENCOUNTER — Encounter: Payer: Self-pay | Admitting: Internal Medicine

## 2013-04-18 ENCOUNTER — Other Ambulatory Visit: Payer: Self-pay | Admitting: *Deleted

## 2013-04-18 MED ORDER — POTASSIUM CHLORIDE CRYS ER 20 MEQ PO TBCR: 40.0000 meq | EXTENDED_RELEASE_TABLET | Freq: Every day | ORAL | Status: DC

## 2013-04-18 MED ORDER — FLUDROCORTISONE ACETATE 0.1 MG PO TABS: ORAL_TABLET | ORAL | Status: DC

## 2013-04-24 ENCOUNTER — Observation Stay (HOSPITAL_COMMUNITY)
Admission: EM | Admit: 2013-04-24 | Discharge: 2013-04-25 | Disposition: A | Discharge status: Home / Self Care | Payer: Medicare Other | Attending: Internal Medicine | Admitting: Internal Medicine

## 2013-04-24 ENCOUNTER — Encounter (HOSPITAL_COMMUNITY): Payer: Self-pay | Admitting: Emergency Medicine

## 2013-04-24 ENCOUNTER — Emergency Department (HOSPITAL_COMMUNITY): Payer: Medicare Other

## 2013-04-24 DIAGNOSIS — R0602 Shortness of breath: Secondary | ICD-10-CM | POA: Insufficient documentation

## 2013-04-24 DIAGNOSIS — D649 Anemia, unspecified: Secondary | ICD-10-CM | POA: Insufficient documentation

## 2013-04-24 DIAGNOSIS — Z79899 Other long term (current) drug therapy: Secondary | ICD-10-CM | POA: Insufficient documentation

## 2013-04-24 DIAGNOSIS — R002 Palpitations: Secondary | ICD-10-CM

## 2013-04-24 DIAGNOSIS — I4891 Unspecified atrial fibrillation: Secondary | ICD-10-CM | POA: Insufficient documentation

## 2013-04-24 DIAGNOSIS — Z8673 Personal history of transient ischemic attack (TIA), and cerebral infarction without residual deficits: Secondary | ICD-10-CM | POA: Insufficient documentation

## 2013-04-24 DIAGNOSIS — I495 Sick sinus syndrome: Secondary | ICD-10-CM | POA: Insufficient documentation

## 2013-04-24 DIAGNOSIS — Z9889 Other specified postprocedural states: Secondary | ICD-10-CM | POA: Insufficient documentation

## 2013-04-24 DIAGNOSIS — Z880 Allergy status to penicillin: Secondary | ICD-10-CM | POA: Insufficient documentation

## 2013-04-24 DIAGNOSIS — D696 Thrombocytopenia, unspecified: Secondary | ICD-10-CM | POA: Diagnosis not present

## 2013-04-24 DIAGNOSIS — E0590 Thyrotoxicosis, unspecified without thyrotoxic crisis or storm: Secondary | ICD-10-CM | POA: Diagnosis not present

## 2013-04-24 DIAGNOSIS — R079 Chest pain, unspecified: Secondary | ICD-10-CM

## 2013-04-24 DIAGNOSIS — M129 Arthropathy, unspecified: Secondary | ICD-10-CM | POA: Insufficient documentation

## 2013-04-24 DIAGNOSIS — Z7901 Long term (current) use of anticoagulants: Secondary | ICD-10-CM | POA: Diagnosis not present

## 2013-04-24 DIAGNOSIS — R0789 Other chest pain: Principal | ICD-10-CM | POA: Insufficient documentation

## 2013-04-24 DIAGNOSIS — I442 Atrioventricular block, complete: Secondary | ICD-10-CM

## 2013-04-24 DIAGNOSIS — Z888 Allergy status to other drugs, medicaments and biological substances status: Secondary | ICD-10-CM | POA: Diagnosis not present

## 2013-04-24 DIAGNOSIS — Z8669 Personal history of other diseases of the nervous system and sense organs: Secondary | ICD-10-CM | POA: Diagnosis not present

## 2013-04-24 DIAGNOSIS — Z950 Presence of cardiac pacemaker: Secondary | ICD-10-CM | POA: Diagnosis not present

## 2013-04-24 DIAGNOSIS — R55 Syncope and collapse: Secondary | ICD-10-CM | POA: Diagnosis present

## 2013-04-24 LAB — BASIC METABOLIC PANEL
BUN: 26 mg/dL — AB (ref 6–23)
CO2: 29 mEq/L (ref 19–32)
CREATININE: 1.4 mg/dL — AB (ref 0.50–1.10)
Calcium: 8.6 mg/dL (ref 8.4–10.5)
Chloride: 100 mEq/L (ref 96–112)
GFR, EST AFRICAN AMERICAN: 39 mL/min — AB (ref >90)
GFR, EST NON AFRICAN AMERICAN: 34 mL/min — AB (ref >90)
Glucose, Bld: 147 mg/dL — ABNORMAL HIGH (ref 70–99)
POTASSIUM: 3.8 meq/L (ref 3.7–5.3)
Sodium: 140 mEq/L (ref 137–147)

## 2013-04-24 LAB — CBC
HCT: 41.2 % (ref 36.0–46.0)
Hemoglobin: 14.2 g/dL (ref 12.0–15.0)
MCH: 32.8 pg (ref 26.0–34.0)
MCHC: 34.5 g/dL (ref 30.0–36.0)
MCV: 95.2 fL (ref 78.0–100.0)
Platelets: 128 10*3/uL — ABNORMAL LOW (ref 150–400)
RBC: 4.33 MIL/uL (ref 3.87–5.11)
RDW: 13.1 % (ref 11.5–15.5)
WBC: 5.5 10*3/uL (ref 4.0–10.5)

## 2013-04-24 LAB — I-STAT TROPONIN, ED: Troponin i, poc: 0.01 ng/mL (ref 0.00–0.08)

## 2013-04-24 LAB — PROTIME-INR
INR: 1.5 — ABNORMAL HIGH (ref 0.00–1.49)
Prothrombin Time: 17.7 seconds — ABNORMAL HIGH (ref 11.6–15.2)

## 2013-04-24 LAB — PRO B NATRIURETIC PEPTIDE: Pro B Natriuretic peptide (BNP): 160.8 pg/mL (ref 0–450)

## 2013-04-24 MED ORDER — WARFARIN SODIUM 5 MG PO TABS
5.0000 mg | ORAL_TABLET | Freq: Once | ORAL | Status: AC
  Administered 2013-04-24: 5 mg via ORAL
  Filled 2013-04-24: qty 1

## 2013-04-24 MED ORDER — POTASSIUM CHLORIDE CRYS ER 20 MEQ PO TBCR
40.0000 meq | EXTENDED_RELEASE_TABLET | Freq: Every day | ORAL | Status: DC
  Filled 2013-04-24: qty 2

## 2013-04-24 MED ORDER — DOFETILIDE 250 MCG PO CAPS
250.0000 ug | ORAL_CAPSULE | Freq: Two times a day (BID) | ORAL | Status: DC
  Administered 2013-04-25: 250 ug via ORAL
  Filled 2013-04-24 (×3): qty 1

## 2013-04-24 MED ORDER — FLUDROCORTISONE ACETATE 0.1 MG PO TABS
0.1000 mg | ORAL_TABLET | Freq: Two times a day (BID) | ORAL | Status: DC
  Administered 2013-04-25: 0.1 mg via ORAL
  Filled 2013-04-24 (×3): qty 1

## 2013-04-24 MED ORDER — WARFARIN - PHARMACIST DOSING INPATIENT: Freq: Every day | Status: DC

## 2013-04-24 MED ORDER — POTASSIUM CHLORIDE CRYS ER 20 MEQ PO TBCR
20.0000 meq | EXTENDED_RELEASE_TABLET | Freq: Once | ORAL | Status: AC
  Administered 2013-04-24: 20 meq via ORAL
  Filled 2013-04-24: qty 1

## 2013-04-24 NOTE — ED Provider Notes (Signed)
Katrina Rodriguez S 8:00PM Pt discussed in sign out.  Pt with previous cardiac hx here with chest pains and concerns for heart palpitations and malfunctioning pace maker.  Cardiology to see and decided on dispo.  8:30PM Dr. Wynonia Lawman with cards has seen pt and will admit.  Martie Lee, PA-C 04/24/13 2027

## 2013-04-24 NOTE — H&P (Signed)
History and Physical   Admit date: 04/24/2013 Name:  Katrina Rodriguez Medical record number: [id number] DOB/Age:  [birth year]  80 y.o. female  Referring Physician: [facility]  Primary Cardiologist:  Dr. Peter [name]  Chief complaint/reason for admission: Chest pain, atrial fibrillation, passed out  HPI:  This 80 year old female has a long-standing history of paroxysmal atrial fibrillation. She had an ablation several years ago and also has had a permanent pacemaker implanted in the past. She has had a previous generator change and also has an atrial lead for which impedance is been rising. She has been unable to tolerate ventricular pacing and is programmed to ADI mode now. Her history is complex in that she also has significant orthostasis and has had recurrent syncope due to orthostasis and low blood pressure. She had recurrence of atrial fibrillation following ablation and has been on Tikosyn. She also is known tachycardia bradycardia syndrome and was recently taken off of her beta blockers. She has been a long-time treatment with warfarin as she has not been able to afford the novel oral anticoagulants.  This morning she had an episode of weakness where she dropped her knees and then complaints of pain involving her left arm. She complained of chest pain earlier in the day also which is a chronic complaint for her.  This afternoon she had an episode of presyncope and then had some chest and felt like she was in atrial fibrillation for round 3 hours. She came to the emergency room where her pacemaker was interrogated and there was an 8 second run of high ventricular response and evidently was preceded by what looks like high degree AV block. She is brought in at this time for observation and further evaluation of possible high degree AV block.    Past Medical History  Diagnosis Date  . Thrombocytopenia   . H/O: CVA (cardiovascular accident)     Right internal capsule  . Orthostasis    . Anticoagulant long-term use   . Pacemaker   . Tachycardia-bradycardia syndrome   . Mild pulmonary hypertension   . Chronic anemia   . H/O syncope   . Chest pain     Has normal coronaries per cath in 2005; negative nuclear in 2010  . Atrial fibrillation     History of ablation - Dr. Rayann Heman  . Hyperthyroidism   . Retinal detachment   . High risk medication use     Tikosyn & Coumadin  . Arthritis      Past Surgical History  Procedure Laterality Date  . Cardiac pacemaker placement    . Cardiac catheterization    . Retinal detachment surgery     Allergies: is allergic to amiodarone; amoxicillin; darvocet; demerol; and penicillins.   Medications: Prior to Admission medications   Medication Sig Start Date End Date Taking? Authorizing Provider  dofetilide (TIKOSYN) 250 MCG capsule Take 1 capsule (250 mcg total) by mouth 2 (two) times daily. 07/09/12  Yes Peter M [name], MD  fludrocortisone (FLORINEF) 0.1 MG tablet Take 2 tablets (0.2 mg) twice a day 04/18/13  Yes Peter M [name], MD  potassium chloride SA (K-DUR,KLOR-CON) 20 MEQ tablet Take 2 tablets (40 mEq total) by mouth daily. 04/18/13  Yes Peter M [name], MD  warfarin (COUMADIN) 4 MG tablet Take 2-4 mg by mouth daily. 4mg  on Mon., Wed., & Fri., all other days take 2mg  including Sundays.   Yes Historical Provider, MD    Family History:  Family Status  Relation Status Death  Age  . Father Deceased 26's  . Brother Alive   . Brother Deceased   . Mother Deceased 76  . Brother Deceased   . Brother Deceased   . Brother Deceased   . Sister Alive   . Sister Alive   . Sister Alive   . Sister Alive     Social History:   reports that she has never smoked. She has never used smokeless tobacco. She reports that she does not drink alcohol or use illicit drugs.   History   Social History Narrative  . No narrative on file     Review of Systems: She has significant chronic chest pain. She is also complained of some episodes of  weakness involving her left arm as well as pain involving her left arm. She has occasional short-lived palpitations. Other than as noted above, the remainder of the review of systems is normal  Physical Exam: BP 128/59  Pulse 71  Temp(Src) 97.5 F (36.4 C) (Oral)  Resp 16  SpO2 95%  General appearance: Thin pleasant but anxious elderly female in no acute distress Head: Normocephalic, without obvious abnormality, atraumatic Eyes: conjunctivae/corneas clear. PERRL, EOM's intact. Fundi not examined  Neck: no adenopathy, no carotid bruit, no JVD and supple, symmetrical, trachea midline Lungs: clear to auscultation bilaterally Heart: regular rate and rhythm, S1, S2 normal, no murmur, click, rub or gallop Abdomen: soft, non-tender; bowel sounds normal; no masses,  no organomegaly Pelvic: deferred Extremities: No edema noted Pulses: 2+ and symmetric Skin: Skin color, texture, turgor normal. No rashes or lesions Neurologic: Grossly normal  Labs: CBC  Recent Labs  04/24/13 1631  WBC 5.5  RBC 4.33  HGB 14.2  HCT 41.2  PLT 128*  MCV 95.2  MCH 32.8  MCHC 34.5  RDW 13.1   CMP   Recent Labs  04/24/13 1631  NA 140  K 3.8  CL 100  CO2 29  GLUCOSE 147*  BUN 26*  CREATININE 1.40*  CALCIUM 8.6  GFRNONAA 34*  GFRAA 39*   BNP (last 3 results)  Recent Labs  06/11/12 1918 04/24/13 1631  PROBNP 506.9* 160.8   Thyroid  Lab Results  Component Value Date   TSH 1.04 09/16/2012    EKG: Normal sinus rhythm, nonspecific ST and T-wave changes  Radiology: Lungs clear, pacemaker apparatus appears intact   IMPRESSIONS: 1. Episode of syncope and presyncope associated with chest pain and palpitations in a patient with a complex history. It is unclear whether this was due to orthostasis, intermittent high degree AV block or atrial fibrillation 2. History of paroxysmal atrial fibrillation 3. History of stroke 4. Long-term anticoagulation with warfarin  PLAN: Patiently  placed on telemetry overnight continue her usual medicines. Dr. Rayann Heman will make some temporary pacemaker adjustments this evening and will make further adjustments and recommendations in the morning. She is not safe to go home as she lives alone and there is a concern if this is high degree block that she could injure herself.  Signed: Kerry Hough MD [facility]  04/24/2013, 8:24 PM

## 2013-04-24 NOTE — Progress Notes (Signed)
ANTICOAGULATION CONSULT NOTE - Initial Consult  Pharmacy Consult for warfarin Indication: atrial fibrillation  Allergies  Allergen Reactions  . Amiodarone Other (See Comments)    Caused thyroid problems  . Amoxicillin Hives  . Darvocet [Propoxyphene N-Acetaminophen] Hives  . Demerol Itching  . Penicillins Hives    Vital Signs: Temp: 97.5 F (36.4 C) (02/19 1630) Temp src: Oral (02/19 1630) BP: 118/62 mmHg (02/19 2025) Pulse Rate: 66 (02/19 2025)  Labs:  Recent Labs  04/24/13 1631 04/24/13 1715  HGB 14.2  --   HCT 41.2  --   PLT 128*  --   LABPROT  --  17.7*  INR  --  1.50*  CREATININE 1.40*  --     The CrCl is unknown because both a height and weight (above a minimum accepted value) are required for this calculation.   Medical History: Past Medical History  Diagnosis Date  . Thrombocytopenia   . H/O: CVA (cardiovascular accident)     Right internal capsule  . Orthostasis   . Anticoagulant long-term use   . Pacemaker   . Tachycardia-bradycardia syndrome   . Mild pulmonary hypertension   . Chronic anemia   . H/O syncope   . Chest pain     Has normal coronaries per cath in 2005; negative nuclear in 2010  . Atrial fibrillation     History of ablation - Dr. Rayann Heman  . Hyperthyroidism   . Retinal detachment   . High risk medication use     Tikosyn & Coumadin  . Arthritis     Medications:  See med list  Assessment: 80 year old pt presents with episode of syncope associated with chest pain and weakness.  Pt has long history of paroxysmal AFib for which she takes warfarin at home - 4 mg po on Mon/Wed/Fri and 2 mg po on all other days.  Her on INR on admission is subtherapeutic at 1.5.   Goal of Therapy:  INR 2-3 Monitor platelets by anticoagulation protocol: Yes   Plan:  Warfarin 5 mg po x1 Daily INR  Hughes Better, PharmD, BCPS Clinical Pharmacist Pager: [phone] 04/24/2013 8:50 PM

## 2013-04-24 NOTE — ED Notes (Signed)
Patient transported to floor.

## 2013-04-24 NOTE — ED Provider Notes (Signed)
CSN: [id number]     Arrival date & time 04/24/13  1622 History   First MD Initiated Contact with Patient 04/24/13 1722     Chief Complaint  Patient presents with  . Chest Pain     (Consider location/radiation/quality/duration/timing/severity/associated sxs/prior Treatment) HPI Comments: 80 year old female presents with multiple episodes of atrial fibrillation/palpitations with chest pain. She states she had poor by these episodes as morning and this afternoon had a prolonged episode about 3 hours. She received an ablation 3 years ago and has been on Tikosyn. Prior to this she rarely has problems with her atrial fibrillation. She also has a pacemaker but has not had any issues with this. Denies any current shortness of breath or current symptoms. She states she's never had a heart attack. Her current cardiologist is Dr. [name] and the cardiologist that follows her for pacemaker is Dr. Rayann Heman.   Past Medical History  Diagnosis Date  . Thrombocytopenia   . H/O: CVA (cardiovascular accident)     Right internal capsule  . Orthostasis   . Anticoagulant long-term use   . Pacemaker   . Tachycardia-bradycardia syndrome   . Mild pulmonary hypertension   . Chronic anemia   . H/O syncope   . Chest pain     Has normal coronaries per cath in 2005; negative nuclear in 2010  . Atrial fibrillation     History of ablation - Dr. Rayann Heman  . Hyperthyroidism   . Retinal detachment   . High risk medication use     Tikosyn & Coumadin  . Arthritis    Past Surgical History  Procedure Laterality Date  . Cardiac pacemaker placement    . Cardiac catheterization    . Retinal detachment surgery     Family History  Problem Relation Age of Onset  . Heart disease Father   . Heart disease Brother   . Heart disease Brother    History  Substance Use Topics  . Smoking status: Never Smoker   . Smokeless tobacco: Never Used  . Alcohol Use: No   OB History   Grav Para Term Preterm Abortions TAB SAB Ect  Mult Living                 Review of Systems  Respiratory: Positive for shortness of breath.   Cardiovascular: Positive for chest pain and palpitations. Negative for leg swelling.  Gastrointestinal: Negative for nausea, vomiting and abdominal pain.  All other systems reviewed and are negative.      Allergies  Amiodarone; Amoxicillin; Darvocet; Demerol; and Penicillins  Home Medications   Current Outpatient Rx  Name  Route  Sig  Dispense  Refill  . dofetilide (TIKOSYN) 250 MCG capsule   Oral   Take 1 capsule (250 mcg total) by mouth 2 (two) times daily.   180 capsule   3     Patient take one tablet 2 times daily . confirmed  ...   . fludrocortisone (FLORINEF) 0.1 MG tablet      Take 2 tablets (0.2 mg) twice a day   360 tablet   0   . potassium chloride SA (K-DUR,KLOR-CON) 20 MEQ tablet   Oral   Take 2 tablets (40 mEq total) by mouth daily.   180 tablet   0   . warfarin (COUMADIN) 4 MG tablet   Oral   Take 2-4 mg by mouth daily. 4mg  on Mon., Wed., & Fri., all other days take 2mg  including Sundays.  BP 136/62  Pulse 82  Temp(Src) 97.5 F (36.4 C) (Oral)  Resp 18  SpO2 98% Physical Exam  Nursing note and vitals reviewed. Constitutional: She is oriented to person, place, and time. She appears well-developed and well-nourished.  HENT:  Head: Normocephalic and atraumatic.  Right Ear: External ear normal.  Left Ear: External ear normal.  Nose: Nose normal.  Eyes: Right eye exhibits no discharge. Left eye exhibits no discharge.  Cardiovascular: Normal rate, regular rhythm and normal heart sounds.   Pulmonary/Chest: Effort normal and breath sounds normal.  Abdominal: Soft. She exhibits no distension. There is no tenderness.  Musculoskeletal: She exhibits no edema.  Neurological: She is alert and oriented to person, place, and time.  Skin: Skin is warm and dry.    ED Course  Procedures (including critical care time) Labs Review Labs Reviewed   CBC - Abnormal; Notable for the following:    Platelets 128 (*)    All other components within normal limits  BASIC METABOLIC PANEL - Abnormal; Notable for the following:    Glucose, Bld 147 (*)    BUN 26 (*)    Creatinine, Ser 1.40 (*)    GFR calc non Af Amer 34 (*)    GFR calc Af Amer 39 (*)    All other components within normal limits  PROTIME-INR - Abnormal; Notable for the following:    Prothrombin Time 17.7 (*)    INR 1.50 (*)    All other components within normal limits  PRO B NATRIURETIC PEPTIDE  I-STAT TROPOININ, ED   Imaging Review Dg Chest 2 View  04/24/2013   CLINICAL DATA:  Chest pain  EXAM: CHEST  2 VIEW  COMPARISON:  07/07/2012  FINDINGS: There is a left chest wall pacer device with lead in the right atrial appendage and right ventricle. The heart size is normal. There is no pleural effusion or edema. No airspace consolidation identified. The visualized osseous structures appear diffusely osteopenic.  IMPRESSION: No active cardiopulmonary disease.   Electronically Signed   By: Kerby Moors M.D.   On: 04/24/2013 17:21    EKG Interpretation    Date/Time:  Thursday April 24 2013 16:28:05 EST Ventricular Rate:  83 PR Interval:  142 QRS Duration: 66 QT Interval:  400 QTC Calculation: 470 R Axis:   21 Text Interpretation:  Normal sinus rhythm Low voltage QRS Cannot rule out Anterior infarct , age undetermined Nonspecific ST and T wave abnormality Confirmed by Luis Sami  MD, Jadzia Ibsen ([id number]) on 04/24/2013 5:15:32 PM            MDM   Final diagnoses:  Chest pain  Heart palpitations    Patient pain free here. No EKG changes from prior, but no sx. Given her sx, Dr. Emmie Niemann consulted, will admit.    Ephraim Hamburger, MD 04/24/13 2104

## 2013-04-24 NOTE — ED Notes (Signed)
Pt reports having heart palpitations today along with mid chest pains that radiated into left arm and sob. Reports symptoms having improved pta and is now pain free. Hx of afib. ekg done, airway intact.

## 2013-04-24 NOTE — ED Notes (Signed)
Per [employer] technician, patient's pacemaker settings have been changed. Previous settings only included atrial pacing. Patient was found to have dropped some beats during interrogation. Ventricular lead has been activated and should pace patient if HR falls below 60.

## 2013-04-25 DIAGNOSIS — R002 Palpitations: Secondary | ICD-10-CM | POA: Diagnosis not present

## 2013-04-25 DIAGNOSIS — Z950 Presence of cardiac pacemaker: Secondary | ICD-10-CM | POA: Diagnosis not present

## 2013-04-25 DIAGNOSIS — R0789 Other chest pain: Secondary | ICD-10-CM | POA: Diagnosis not present

## 2013-04-25 DIAGNOSIS — I442 Atrioventricular block, complete: Secondary | ICD-10-CM | POA: Diagnosis not present

## 2013-04-25 DIAGNOSIS — M129 Arthropathy, unspecified: Secondary | ICD-10-CM | POA: Diagnosis not present

## 2013-04-25 DIAGNOSIS — I4891 Unspecified atrial fibrillation: Secondary | ICD-10-CM | POA: Diagnosis not present

## 2013-04-25 LAB — PROTIME-INR
INR: 1.61 — ABNORMAL HIGH (ref 0.00–1.49)
Prothrombin Time: 18.7 seconds — ABNORMAL HIGH (ref 11.6–15.2)

## 2013-04-25 MED ORDER — WARFARIN SODIUM 5 MG PO TABS
5.0000 mg | ORAL_TABLET | Freq: Once | ORAL | Status: DC
  Filled 2013-04-25: qty 1

## 2013-04-25 MED ORDER — COUMADIN BOOK
1.0000 | Freq: Once | Status: DC
  Filled 2013-04-25: qty 1

## 2013-04-25 MED ORDER — WARFARIN VIDEO: 1.0000 | Freq: Once | Status: DC

## 2013-04-25 NOTE — Consult Note (Signed)
ELECTROPHYSIOLOGY CONSULT NOTE    Patient ID: Katrina Rodriguez MRN: [id number], DOB/AGE: 07/14/1928 80 y.o.  Admit date: 04/24/2013 Date of Consult: 04-25-2013  Primary Physician: [name], MD Primary Cardiologist: [name]: [name]  Reason for Consultation: syncope, heart block   HPI:  Katrina Rodriguez is a 80 y.o. female with a past medical history of atrial fibrillation (s/p PVI 06/2008, on Tikosyn), tachy brady syndrome s/p MDT PPM implant 2004, gen change 2014, prior CVA, orthostatic hypotension (on Florinef), and chest pain (normal cath in 2005, negative nuclear study 2010).  She has been intolerant of ventricular pacing in the past and her device has chronically been programmed ADI.    She has a longstanding history of dizziness and syncope.  She mostly manages this with behavioral modifications and is aware of the prodrome.  She states that her symptoms can occur in any position but mostly occur when standing.  Yesterday, she was washing her hair in the sink, became dizzy, and everything started to go black.  She sat on the bathroom floor until her symptoms resolved. She has also been having recurrent chest pain - she describes as sensations of a shooting pain. She previously thought this was related to ventricular pacing, but her device has been programmed with no ventricular pacing.  Her chest pain occur at any time and are not related to exertion.  They are not associated with shortness of breath or any other symptoms.   Her device was interrogated yesterday with normal device function.  She did have a ventricular high rate episode yesterday that was preceded by an episode of heart block.  Her device was reprogrammed last night to DDD with search AV on.   Lab work this admission is notable for creat of 1.4, K 3.8, INR 1.61  EP has been asked to evaluate for treatment options.   ROS is negative except as outlined above.   Past Medical History  Diagnosis Date    . Thrombocytopenia   . H/O: CVA (cardiovascular accident)     Right internal capsule  . Orthostasis   . Anticoagulant long-term use   . Pacemaker   . Tachycardia-bradycardia syndrome   . Mild pulmonary hypertension   . Chronic anemia   . H/O syncope   . Chest pain     Has normal coronaries per cath in 2005; negative nuclear in 2010  . Atrial fibrillation     History of ablation - Dr. Rayann Heman  . Hyperthyroidism   . Retinal detachment   . High risk medication use     Tikosyn & Coumadin  . Arthritis      Surgical History:  Past Surgical History  Procedure Laterality Date  . Cardiac pacemaker placement    . Cardiac catheterization    . Retinal detachment surgery       Prescriptions prior to admission  Medication Sig Dispense Refill  . dofetilide (TIKOSYN) 250 MCG capsule Take 1 capsule (250 mcg total) by mouth 2 (two) times daily.  180 capsule  3  . fludrocortisone (FLORINEF) 0.1 MG tablet Take 2 tablets (0.2 mg) twice a day  360 tablet  0  . potassium chloride SA (K-DUR,KLOR-CON) 20 MEQ tablet Take 2 tablets (40 mEq total) by mouth daily.  180 tablet  0  . warfarin (COUMADIN) 4 MG tablet Take 2-4 mg by mouth daily. 4mg  on Mon., Wed., & Fri., all other days take 2mg  including Sundays.        Inpatient Medications:  .  dofetilide  250 mcg Oral BID  . fludrocortisone  0.1 mg Oral BID  . potassium chloride SA  40 mEq Oral Daily  . Warfarin - Pharmacist Dosing Inpatient   Does not apply q1800   Physical Exam: Filed Vitals:   04/24/13 2100 04/24/13 2105 04/24/13 2143 04/25/13 0533  BP: 130/58 130/58 145/42 118/56  Pulse: 62 63 61 60  Temp:   98 F (36.7 C)   TempSrc:   Oral   Resp: 16 16 18 18   Height:   5\' 5"  (1.651 m)   Weight:   123 lb 7.3 oz (56 kg)   SpO2: 97% 96% 100% 99%    GEN- The patient is elderly appearing, alert and oriented x 3 today.   Head- normocephalic, atraumatic Eyes-  Sclera clear, conjunctiva pink, poor vision Ears- hearing intact Oropharynx-  clear Neck- supple,   Lungs- Clear to ausculation bilaterally, normal work of breathing Heart- Regular rate and rhythm  GI- soft, NT, ND, + BS Extremities- no clubbing, cyanosis, or edema MS-age appropriate muscle atrophy Skin- no rash or lesion Psych- euthymic mood, full affect Neuro- strength and sensation are intact   Allergies:  Allergies  Allergen Reactions  . Amiodarone Other (See Comments)    Caused thyroid problems  . Amoxicillin Hives  . Darvocet [Propoxyphene N-Acetaminophen] Hives  . Demerol Itching  . Penicillins Hives    History   Social History  . Marital Status: Widowed    Spouse Name: N/A    Number of Children: 2  . Years of Education: N/A   Occupational History  . Homemaker    Social History Main Topics  . Smoking status: Never Smoker   . Smokeless tobacco: Never Used  . Alcohol Use: No  . Drug Use: No  . Sexual Activity: No   Other Topics Concern  . Not on file   Social History Narrative  . No narrative on file     Family History  Problem Relation Age of Onset  . Heart disease Father   . Heart disease Brother   . Heart disease Brother       Labs:   Lab Results  Component Value Date   WBC 5.5 04/24/2013   HGB 14.2 04/24/2013   HCT 41.2 04/24/2013   MCV 95.2 04/24/2013   PLT 128* 04/24/2013    Recent Labs Lab 04/24/13 1631  NA 140  K 3.8  CL 100  CO2 29  BUN 26*  CREATININE 1.40*  CALCIUM 8.6  GLUCOSE 147*     Radiology/Studies: Dg Chest 2 View 04/24/2013   CLINICAL DATA:  Chest pain  EXAM: CHEST  2 VIEW  COMPARISON:  07/07/2012  FINDINGS: There is a left chest wall pacer device with lead in the right atrial appendage and right ventricle. The heart size is normal. There is no pleural effusion or edema. No airspace consolidation identified. The visualized osseous structures appear diffusely osteopenic.  IMPRESSION: No active cardiopulmonary disease.   Electronically Signed   By: Kerby Moors M.D.   On: 04/24/2013 17:21   EKG:  sinus rhythm, rate 83, normal intervals, QTc 470  TELEMETRY: sinus rhythm, occasional atrial pacing  DEVICE HISTORY: Medtronic dual chamber pacemaker implanted 2004, generator change 2014  A/P 1. Transient complete heart block The patient has transient complete heart block demonstrated on ppm.  She does not tolerate ventricular pacing typically.  I have programmed DDD today (from prior ADI) with search AV on to minimize pacing.  No other changes today  2. Acute renal failure Hydration encouraged Return to the office in 1 week for BMET Keep tikosyn dose unchanged as qt is not prolonged  3. afib Stable No change required today  Ok to discharge with close outpatient follow-up.

## 2013-04-25 NOTE — ED Provider Notes (Signed)
Medical screening examination/treatment/procedure(s) were performed by non-physician practitioner and as supervising physician I was immediately available for consultation/collaboration.      Sharyon Cable, MD 04/25/13 0040

## 2013-04-25 NOTE — Discharge Summary (Signed)
ELECTROPHYSIOLOGY PROCEDURE DISCHARGE SUMMARY    Patient ID: Katrina Rodriguez,  MRN: [id number], DOB/AGE: [birth year] 80 y.o.  Admit date: 04/24/2013 Discharge date: 04/25/2013  Primary Care Physician: [name], MD Primary Cardiologist: Peter [name], MD Electrophysiologist: Thompson Grayer, MD  Primary Discharge Diagnosis:  Syncope due to intermittent high grade heart block  Secondary Discharge Diagnosis:  1.  Paroxysmal atrial fibrillation s/p PVI 2010; now on Tikosyn; s/p MDT dual chamber pacemaker for tachy-brady syndrome 2.  Chronic anticoagulation with Warfarin 3.  Orthostatic hypotension - on Florinef  Allergies  Allergen Reactions  . Amiodarone Other (See Comments)    Caused thyroid problems  . Amoxicillin Hives  . Darvocet [Propoxyphene N-Acetaminophen] Hives  . Demerol Itching  . Penicillins Hives    Brief HPI/Hospital Course:  Katrina Rodriguez is a 80 y.o. female with a past medical history of atrial fibrillation (s/p PVI 06/2008, on Tikosyn), tachy brady syndrome s/p MDT PPM implant 2004, gen change 2014, prior CVA, orthostatic hypotension (on Florinef), and chest pain (normal cath in 2005, negative nuclear study 2010). She has been intolerant of ventricular pacing in the past and her device has chronically been programmed ADI.   She has a longstanding history of dizziness and syncope. She mostly manages this with behavioral modifications and is aware of the prodrome. She states that her symptoms can occur in any position but mostly occur when standing. Yesterday, she was washing her hair in the sink, became dizzy, and everything started to go black. She sat on the bathroom floor until her symptoms resolved. She has also been having recurrent chest pain - she describes as sensations of a shooting pain. She previously thought this was related to ventricular pacing, but her device has been programmed with no ventricular pacing. Her chest pain occur at any time and are not  related to exertion. They are not associated with shortness of breath or any other symptoms.   Her device was interrogated yesterday with normal device function. She did have a ventricular high rate episode on the day of admission (atrial tach) that was preceded by an episode of heart block. Her device was reprogrammed to DDD with search AV on to allow for ventricular pacing when needed to prevent future episodes of heart block.    She was monitored on telemetry overnight which demonstrated sinus rhythm with intermittent atrial pacing.  She was evaluated and considered stable for discharge to home by Dr Rayann Heman.  She was encouraged to increase po hydration due to increase in creatinine on admission labs.  She will have a repeat BMET in 1 week in the office to ensure normalization of creatinine.   Discharge Vitals: Blood pressure 118/56, pulse 60, temperature 98 F (36.7 C), temperature source Oral, resp. rate 18, height 5\' 5"  (1.651 m), weight 123 lb 7.3 oz (56 kg), SpO2 99.00%.   Physical Exam: Filed Vitals:   04/24/13 2100 04/24/13 2105 04/24/13 2143 04/25/13 0533  BP: 130/58 130/58 145/42 118/56  Pulse: 62 63 61 60  Temp:   98 F (36.7 C)   TempSrc:   Oral   Resp: 16 16 18 18   Height:   5\' 5"  (1.651 m)   Weight:   123 lb 7.3 oz (56 kg)   SpO2: 97% 96% 100% 99%    GEN- The patient is elderly appearing, alert and oriented x 3 today.   Head- normocephalic, atraumatic Eyes-  Sclera clear, conjunctiva pink, very poor hearing Ears- hearing intact Oropharynx- clear Neck-  supple  Lungs- Clear to ausculation bilaterally, normal work of breathing Heart- Regular rate and rhythm  GI- soft, NT, ND, + BS Extremities- no clubbing, cyanosis, or edema MS-age appropriate muscle atrophy Skin- no rash or lesion Psych- euthymic mood, full affect Neuro- strength and sensation are intact   Labs:   Lab Results  Component Value Date   WBC 5.5 04/24/2013   HGB 14.2 04/24/2013   HCT 41.2 04/24/2013     MCV 95.2 04/24/2013   PLT 128* 04/24/2013     Recent Labs Lab 04/24/13 1631  NA 140  K 3.8  CL 100  CO2 29  BUN 26*  CREATININE 1.40*  CALCIUM 8.6  GLUCOSE 147*    Discharge Medications:    Medication List         dofetilide 250 MCG capsule  Commonly known as:  TIKOSYN  Take 1 capsule (250 mcg total) by mouth 2 (two) times daily.     fludrocortisone 0.1 MG tablet  Commonly known as:  FLORINEF  Take 2 tablets (0.2 mg) twice a day     potassium chloride SA 20 MEQ tablet  Commonly known as:  K-DUR,KLOR-CON  Take 2 tablets (40 mEq total) by mouth daily.     warfarin 4 MG tablet  Commonly known as:  COUMADIN  Take 2-4 mg by mouth daily. 4mg  on Mon., Wed., & Fri., all other days take 2mg  including Sundays.        Disposition:   Future Appointments Provider Department Dept Phone   05/01/2013 3:40 PM [facility] Re[facility] [place] [facility] [phone]   05/12/2013 10:00 AM [facility] [place] [facility] [phone]   06/09/2013 8:45 AM Peter M [name], MD [place] Office [phone]   06/30/2013 10:00 AM Thompson Grayer, MD [facility] [phone]     Follow-up Information   Follow up with [facility] On 05/12/2013. (10:00 AM)    Contact information:   [address] [place] [address]       Follow up with Peter [name], MD On 06/09/2013. (8:45 AM)    Specialty:  Cardiology   Contact information:   [address] [place] [address] [phone]       Follow up with Thompson Grayer, MD On 06/30/2013. (10:00 AM)    Specialty:  Cardiology   Contact information:   [place] [address] [phone]       Duration of Discharge Encounter: Greater than 30 minutes including physician time.  Signed,   Thompson Grayer MD

## 2013-04-25 NOTE — Progress Notes (Signed)
Pharmacy Note-Anticoagulation  Pharmacy Consult :  80 y.o. female is currently on Coumadin for PAF. Patient also on Tikosyn.   Latest Labs : Hematology :  Recent Labs  04/24/13 1631 04/24/13 1715 04/25/13 0400  HGB 14.2  --   --   HCT 41.2  --   --   PLT 128*  --   --   LABPROT  --  17.7* 18.7*  INR  --  1.50* 1.61*  CREATININE 1.40*  --   --     Lab Results  Component Value Date   INR 1.61* 04/25/2013   INR 1.50* 04/24/2013        HGB 14.2 04/24/2013    Current Medication[s] Include: Medication PTA: Prescriptions prior to admission  Medication Sig Dispense Refill  . dofetilide (TIKOSYN) 250 MCG capsule Take 1 capsule (250 mcg total) by mouth 2 (two) times daily.  180 capsule  3  . fludrocortisone (FLORINEF) 0.1 MG tablet Take 2 tablets (0.2 mg) twice a day  360 tablet  0  . potassium chloride SA (K-DUR,KLOR-CON) 20 MEQ tablet Take 2 tablets (40 mEq total) by mouth daily.  180 tablet  0  . warfarin (COUMADIN) 4 MG tablet Take 2-4 mg by mouth daily. 4mg  on Mon., Wed., & Fri., all other days take 2mg  including Sundays.       Scheduled:  Scheduled:  . dofetilide  250 mcg Oral BID  . fludrocortisone  0.1 mg Oral BID  . potassium chloride SA  40 mEq Oral Daily  . Warfarin - Pharmacist Dosing Inpatient   Does not apply q1800   Assessment :  Today's INR is Sub-therapeutic.   INR is 1.61.    No bleeding complications observed.  Renal function on admission down from 2 months PTA.  Scr 1.1 > 1.4 with current estimated CrCl ~ 27 ml/min.  Patient in on Tikosyn 250 mcg BID.  Given current renal function, Tikosyn should be reduced to 125 mcg BID as long as CrCl < 40 ml/min.   Goal :  INR goal is 2-3    Tikosyn dose adjusted for renal function.  Plan : 1. Will give Coumadin 5 mg po today. 2. Daily INR's, CBC. Monitor for bleeding complications 3. Will draw BMP today to reassess renal function for Tikosyn therapy.  Katrina Rodriguez, Katrina Rodriguez, Pharm.D. 04/25/2013  11:28 AM

## 2013-04-25 NOTE — Discharge Instructions (Signed)
**PLEASE REMEMBER TO BRING ALL OF YOUR MEDICATIONS TO EACH OF YOUR FOLLOW-UP OFFICE VISITS. Cardiac Diet This diet can help prevent heart disease and stroke. Many factors influence your heart health, including eating and exercise habits. Coronary risk rises a lot with abnormal blood fat (lipid) levels. Cardiac meal planning includes limiting unhealthy fats, increasing healthy fats, and making other small dietary changes. General guidelines are as follows:  Adjust calorie intake to reach and maintain desirable body weight.  Limit total fat intake to less than 30% of total calories. Saturated fat should be less than 7% of calories.  Saturated fats are found in animal products and in some vegetable products. Saturated vegetable fats are found in coconut oil, cocoa butter, palm oil, and palm kernel oil. Read labels carefully to avoid these products as much as possible. Use butter in moderation. Choose tub margarines and oils that have 2 grams of fat or less. Good cooking oils are canola and olive oils.  Practice low-fat cooking techniques. Do not fry food. Instead, broil, bake, boil, steam, grill, roast on a rack, stir-fry, or microwave it. Other fat reducing suggestions include:  Remove the skin from poultry.  Remove all visible fat from meats.  Skim the fat off stews, soups, and gravies before serving them.  Steam vegetables in water or broth instead of sauting them in fat.  Avoid foods with trans fat (or hydrogenated oils), such as commercially fried foods and commercially baked goods. Commercial shortening and deep-frying fats will contain trans fat.  Increase intake of fruits, vegetables, whole grains, and legumes to replace foods high in fat.  Increase consumption of nuts, legumes, and seeds to at least 4 servings weekly. One serving of a legume equals  cup, and 1 serving of nuts or seeds equals  cup.  Choose whole grains more often. Have 3 servings per day (a serving is 1 ounce  [oz]).  Eat 4 to 5 servings of vegetables per day. A serving of vegetables is 1 cup of raw leafy vegetables;  cup of raw or cooked cut-up vegetables;  cup of vegetable juice.  Eat 4 to 5 servings of fruit per day. A serving of fruit is 1 medium whole fruit;  cup of dried fruit;  cup of fresh, frozen, or canned fruit;  cup of 100% fruit juice.  Increase your intake of dietary fiber to 20 to 30 grams per day. Insoluble fiber may help lower your risk of heart disease and may help curb your appetite.  Soluble fiber binds cholesterol to be removed from the blood. Foods high in soluble fiber are dried beans, citrus fruits, oats, apples, bananas, broccoli, Brussels sprouts, and eggplant.  Try to include foods fortified with plant sterols or stanols, such as yogurt, breads, juices, or margarines. Choose several fortified foods to achieve a daily intake of 2 to 3 grams of plant sterols or stanols.  Foods with omega-3 fats can help reduce your risk of heart disease. Aim to have a 3.5 oz portion of fatty fish twice per week, such as salmon, mackerel, albacore tuna, sardines, lake trout, or herring. If you wish to take a fish oil supplement, choose one that contains 1 gram of both DHA and EPA.  Limit processed meats to 2 servings (3 oz portion) weekly.  Limit the sodium in your diet to 1500 milligrams (mg) per day. If you have high blood pressure, talk to a registered dietitian about a DASH (Dietary Approaches to Stop Hypertension) eating plan.  Limit sweets and beverages  with added sugar, such as soda, to no more than 5 servings per week. One serving is:   1 tablespoon sugar.  1 tablespoon jelly or jam.   cup sorbet.  1 cup lemonade.   cup regular soda. CHOOSING FOODS Starches  Allowed: Breads: All kinds (wheat, rye, raisin, white, oatmeal, [place], [place], and English muffin bread). Low-fat rolls: English muffins, frankfurter and hamburger buns, bagels, pita bread, tortillas (not fried).  Pancakes, waffles, biscuits, and muffins made with recommended oil.  Avoid: Products made with saturated or trans fats, oils, or whole milk products. Butter rolls, cheese breads, croissants. Commercial doughnuts, muffins, sweet rolls, biscuits, waffles, pancakes, store-bought mixes. Crackers  Allowed: Low-fat crackers and snacks: Animal, graham, rye, saltine (with recommended oil, no lard), oyster, and matzo crackers. Bread sticks, melba toast, rusks, flatbread, pretzels, and light popcorn.  Avoid: High-fat crackers: cheese crackers, butter crackers, and those made with coconut, palm oil, or trans fat (hydrogenated oils). Buttered popcorn. Cereals  Allowed: Hot or cold whole-grain cereals.  Avoid: Cereals containing coconut, hydrogenated vegetable fat, or animal fat. Potatoes / Pasta / Rice  Allowed: All kinds of potatoes, rice, and pasta (such as macaroni, spaghetti, and noodles).  Avoid: Pasta or rice prepared with cream sauce or high-fat cheese. Chow mein noodles, [place] fries. Vegetables  Allowed: All vegetables and vegetable juices.  Avoid: Fried vegetables. Vegetables in cream, butter, or high-fat cheese sauces. Limit coconut. Fruit in cream or custard. Protein  Allowed: Limit your intake of meat, seafood, and poultry to no more than 6 oz (cooked weight) per day. All lean, well-trimmed beef, veal, pork, and lamb. All chicken and [place] without skin. All fish and shellfish. Wild game: wild duck, rabbit, pheasant, and venison. Egg whites or low-cholesterol egg substitutes may be used as desired. Meatless dishes: recipes with dried beans, peas, lentils, and tofu (soybean curd). Seeds and nuts: all seeds and most nuts.  Avoid: Prime grade and other heavily marbled and fatty meats, such as short ribs, spare ribs, rib eye roast or steak, frankfurters, sausage, bacon, and high-fat luncheon meats, mutton. Caviar. Commercially fried fish. Domestic duck, goose, venison sausage. Organ meats:  liver, gizzard, heart, chitterlings, brains, kidney, sweetbreads. Dairy  Allowed: Low-fat cheeses: nonfat or low-fat cottage cheese (1% or 2% fat), cheeses made with part skim milk, such as mozzarella, farmers, string, or ricotta. (Cheeses should be labeled no more than 2 to 6 grams fat per oz.). Skim (or 1%) milk: liquid, powdered, or evaporated. Buttermilk made with low-fat milk. Drinks made with skim or low-fat milk or cocoa. Chocolate milk or cocoa made with skim or low-fat (1%) milk. Nonfat or low-fat yogurt.  Avoid: Whole milk cheeses, including colby, cheddar, [place], Monterey Jack, [place], [place], [place], American, Swiss, and blue. Creamed cottage cheese, cream cheese. Whole milk and whole milk products, including buttermilk or yogurt made from whole milk, drinks made from whole milk. Condensed milk, evaporated whole milk, and 2% milk. Soups and Combination Foods  Allowed: Low-fat low-sodium soups: broth, dehydrated soups, homemade broth, soups with the fat removed, homemade cream soups made with skim or low-fat milk. Low-fat spaghetti, lasagna, chili, and Spanish rice if low-fat ingredients and low-fat cooking techniques are used.  Avoid: Cream soups made with whole milk, cream, or high-fat cheese. All other soups. Desserts and Sweets  Allowed: Sherbet, fruit ices, gelatins, meringues, and angel food cake. Homemade desserts with recommended fats, oils, and milk products. Jam, jelly, honey, marmalade, sugars, and syrups. Pure sugar candy, such as gum drops, hard  candy, jelly beans, marshmallows, mints, and small amounts of dark chocolate.  Avoid: Commercially prepared cakes, pies, cookies, frosting, pudding, or mixes for these products. Desserts containing whole milk products, chocolate, coconut, lard, palm oil, or palm kernel oil. Ice cream or ice cream drinks. Candy that contains chocolate, coconut, butter, hydrogenated fat, or unknown ingredients. Buttered syrups. Fats and  Oils  Allowed: Vegetable oils: safflower, sunflower, corn, soybean, cottonseed, sesame, canola, olive, or peanut. Non-hydrogenated margarines. Salad dressing or mayonnaise: homemade or commercial, made with a recommended oil. Low or nonfat salad dressing or mayonnaise.  Limit added fats and oils to 6 to 8 tsp per day (includes fats used in cooking, baking, salads, and spreads on bread). Remember to count the "hidden fats" in foods.  Avoid: Solid fats and shortenings: butter, lard, salt pork, bacon drippings. Gravy containing meat fat, shortening, or suet. Cocoa butter, coconut. Coconut oil, palm oil, palm kernel oil, or hydrogenated oils: these ingredients are often used in bakery products, nondairy creamers, whipped toppings, candy, and commercially fried foods. Read labels carefully. Salad dressings made of unknown oils, sour cream, or cheese, such as blue cheese and Roquefort. Cream, all kinds: half-and-half, light, heavy, or whipping. Sour cream or cream cheese (even if "light" or low-fat). Nondairy cream substitutes: coffee creamers and sour cream substitutes made with palm, palm kernel, hydrogenated oils, or coconut oil. Beverages  Allowed: Coffee (regular or decaffeinated), tea. Diet carbonated beverages, mineral water. Alcohol: Check with your caregiver. Moderation is recommended.  Avoid: Whole milk, regular sodas, and juice drinks with added sugar. Condiments  Allowed: All seasonings and condiments. Cocoa powder. "Cream" sauces made with recommended ingredients.  Avoid: Carob powder made with hydrogenated fats. SAMPLE MENU Breakfast   cup orange juice   cup oatmeal  1 slice toast  1 tsp margarine  1 cup skim milk Lunch  [place] sandwich with 2 oz [place], 2 slices bread  Lettuce and tomato slices  Fresh fruit  Carrot sticks  Coffee or tea Snack  Fresh fruit or low-fat crackers Dinner  3 oz lean ground beef  1 baked potato  1 tsp margarine   cup  asparagus  Lettuce salad  1 tbs non-creamy dressing   cup peach slices  1 cup skim milk Document Released: 11/30/2007 Document Revised: 08/22/2011 Document Reviewed: 05/16/2011 Exit[employer] Patient Information 2014 [place], [place].

## 2013-05-01 ENCOUNTER — Encounter: Payer: Medicare Other | Admitting: Physician Assistant

## 2013-05-19 ENCOUNTER — Ambulatory Visit (INDEPENDENT_AMBULATORY_CARE_PROVIDER_SITE_OTHER): Payer: Medicare Other

## 2013-05-19 ENCOUNTER — Encounter: Payer: Self-pay | Admitting: Physician Assistant

## 2013-05-19 ENCOUNTER — Ambulatory Visit (INDEPENDENT_AMBULATORY_CARE_PROVIDER_SITE_OTHER): Payer: Medicare Other | Admitting: *Deleted

## 2013-05-19 ENCOUNTER — Encounter: Payer: Self-pay | Admitting: Internal Medicine

## 2013-05-19 ENCOUNTER — Ambulatory Visit (INDEPENDENT_AMBULATORY_CARE_PROVIDER_SITE_OTHER): Payer: Medicare Other | Admitting: Physician Assistant

## 2013-05-19 VITALS — BP 138/78 | HR 60 | Ht 66.0 in | Wt 116.0 lb

## 2013-05-19 DIAGNOSIS — I4891 Unspecified atrial fibrillation: Secondary | ICD-10-CM

## 2013-05-19 DIAGNOSIS — R55 Syncope and collapse: Secondary | ICD-10-CM | POA: Diagnosis not present

## 2013-05-19 DIAGNOSIS — I951 Orthostatic hypotension: Secondary | ICD-10-CM

## 2013-05-19 DIAGNOSIS — R079 Chest pain, unspecified: Secondary | ICD-10-CM

## 2013-05-19 DIAGNOSIS — N179 Acute kidney failure, unspecified: Secondary | ICD-10-CM | POA: Diagnosis not present

## 2013-05-19 DIAGNOSIS — Z950 Presence of cardiac pacemaker: Secondary | ICD-10-CM

## 2013-05-19 LAB — BASIC METABOLIC PANEL
BUN: 22 mg/dL (ref 6–23)
CO2: 29 mEq/L (ref 19–32)
CREATININE: 1.2 mg/dL (ref 0.4–1.2)
Calcium: 8.8 mg/dL (ref 8.4–10.5)
Chloride: 104 mEq/L (ref 96–112)
GFR: 46.5 mL/min — ABNORMAL LOW (ref >60.00)
Glucose, Bld: 98 mg/dL (ref 70–99)
Potassium: 4.3 mEq/L (ref 3.5–5.1)
Sodium: 140 mEq/L (ref 135–145)

## 2013-05-19 LAB — MDC_IDC_ENUM_SESS_TYPE_INCLINIC
Battery Impedance: 100 Ohm
Battery Voltage: 2.79 V
Brady Statistic AP VP Percent: 0 %
Brady Statistic AP VS Percent: 68 %
Brady Statistic AS VP Percent: 0 %
Brady Statistic AS VS Percent: 32 %
Date Time Interrogation Session: 20150316131324
Lead Channel Impedance Value: 707 Ohm
Lead Channel Pacing Threshold Amplitude: 0.375 V
Lead Channel Pacing Threshold Amplitude: 0.75 V
Lead Channel Pacing Threshold Pulse Width: 0.4 ms
Lead Channel Pacing Threshold Pulse Width: 0.4 ms
Lead Channel Sensing Intrinsic Amplitude: 2.8 mV
Lead Channel Setting Pacing Amplitude: 2.5 V
Lead Channel Setting Sensing Sensitivity: 5.6 mV
MDC IDC MSMT BATTERY REMAINING LONGEVITY: 147 mo
MDC IDC MSMT LEADCHNL RA IMPEDANCE VALUE: 1152 Ohm
MDC IDC MSMT LEADCHNL RV SENSING INTR AMPL: 15.67 mV
MDC IDC SET LEADCHNL RA PACING AMPLITUDE: 2 V
MDC IDC SET LEADCHNL RV PACING PULSEWIDTH: 0.4 ms

## 2013-05-19 LAB — POCT INR: INR: 1.7

## 2013-05-19 NOTE — Patient Instructions (Signed)
Your physician recommends that you return for lab work today for [employer].  Keep scheduled follow up appointments with Dr. [name] and Dr. Rayann Heman.

## 2013-05-19 NOTE — Progress Notes (Signed)
Pacemaker check in clinic. Normal device function. Thresholds, sensing, impedances consistent with previous measurements. Device programmed to maximize longevity. 3 mode switches, <0.1%, + coumadin.  No high ventricular rates noted. Device programmed at appropriate safety margins. Histogram distribution appropriate for patient activity level. Device programmed to optimize intrinsic conduction. Estimated longevity 12 years.  Patient education completed.  Patient c/o chest pain with ventricular pacing.  Follow up as scheduled with Dr. Rayann Heman.

## 2013-05-19 NOTE — Progress Notes (Signed)
[address] [place],   [address] Phone: [phone] Fax:  [phone]  Date:  05/19/2013   ID:  Katrina Rodriguez, DOB [birth year], MRN [id number]  PCP:  Katrina Naas, MD  Cardiologist:  Dr. Peter [name]   Electrophysiologist:  Dr. Thompson Grayer    History of Present Illness: Katrina Rodriguez is a 80 y.o. female with a history of paroxysmal atrial fibrillation s/p PVI ablation in 2010 on Tikosyn Rx, tachy-brady syndrome, s/p pacemaker, orthostatic hypotension controlled on Florinef, prior stroke.  Device has been chronically programmed to ADI as she has been intolerant to V pacing in the past.    She was admitted 2/19-2/20 with near syncope and chest pain.  Device interrogation demonstrated transient complete heart block followed by high ventricular rates (ATach).  Her device was re-programmed to DDD (from prior ADI) with search AV on to minimize pacing (she has not tolerated V pacing in the past).  Creatinine was increased and oral hydration was encouraged.  Since d/c, she continues to note occasional chest pain that is sharp and brief.  This will awaken her in the middle of the night.  She also notes a long hx of exertional chest ache.  This seems to be getting worse.  She notes NYHA class 2-2b DOE.  She denies orthopnea, PND, edema.  She has had further episodes of syncope.  This is not unusual for her.  This typically occurs with standing or prolonged standing as well as straining.    LHC (03/2003):  No significant CAD, EF 65%.   LexiScan Myoview (04/2011):  EF 80%, no ischemia, normal study.   Echo (10/2011):  EF 55-60%, mild AI, mild MR, PASP 31.     Recent Labs: 06/12/2012: HDL Cholesterol 49; LDL (calc) 113*  09/16/2012: TSH 1.04  04/24/2013: Creatinine 1.40*; Hemoglobin 14.2; Potassium 3.8; Pro B Natriuretic peptide (BNP) 160.8   Wt Readings from Last 3 Encounters:  05/19/13 116 lb (52.617 kg)  04/24/13 123 lb 7.3 oz (56 kg)  02/20/13 115 lb (52.164 kg)     Past  Medical History  Diagnosis Date  . Thrombocytopenia   . H/O: CVA (cardiovascular accident)     Right internal capsule  . Orthostasis   . Anticoagulant long-term use   . Pacemaker   . Tachycardia-bradycardia syndrome   . Mild pulmonary hypertension   . Chronic anemia   . H/O syncope   . Chest pain     Has normal coronaries per cath in 2005; negative nuclear in 2010  . Atrial fibrillation     History of ablation - Dr. Rayann Heman  . Hyperthyroidism   . Retinal detachment   . High risk medication use     Tikosyn & Coumadin  . Arthritis     Current Outpatient Prescriptions  Medication Sig Dispense Refill  . dofetilide (TIKOSYN) 250 MCG capsule Take 1 capsule (250 mcg total) by mouth 2 (two) times daily.  180 capsule  3  . fludrocortisone (FLORINEF) 0.1 MG tablet Take 2 tablets (0.2 mg) twice a day  360 tablet  0  . potassium chloride SA (K-DUR,KLOR-CON) 20 MEQ tablet Take 2 tablets (40 mEq total) by mouth daily.  180 tablet  0  . warfarin (COUMADIN) 4 MG tablet Take 2-4 mg by mouth daily. 4mg  on Mon., Wed., & Fri., all other days take 2mg  including Sundays.       No current facility-administered medications for this visit.    Allergies:   Amiodarone; Amoxicillin;  Darvocet; Demerol; and Penicillins   Social History:  The patient  reports that she has never smoked. She has never used smokeless tobacco. She reports that she does not drink alcohol or use illicit drugs.   Family History:  The patient's family history includes Heart disease in her brother, brother, and father.   ROS:  Please see the history of present illness.      All other systems reviewed and negative.   PHYSICAL EXAM: VS:  BP 138/78  Pulse 60  Ht 5\' 6"  (1.676 m)  Wt 116 lb (52.617 kg)  BMI 18.73 kg/m2 Well nourished, well developed, in no acute distress HEENT: normal  Neck: no JVD Cardiac:  normal S1, S2; RRR; no murmur Lungs:  clear to auscultation bilaterally, no wheezing, rhonchi or rales Abd: soft,  nontender, no hepatomegaly Ext: no edema Skin: warm and dry Neuro:  CNs 2-12 intact, no focal abnormalities noted  EKG:   A Paced, HR 60, No ST changes      ASSESSMENT AND PLAN:  1. Chest Pain:  She has exertional chest pain that is chronic.  This seems to be getting worse.  I suggested that she undergo stress testing.  She declines and would prefer to discuss this with Dr. Peter [name].  She also has sharp pain that awakens her at night.  I had her device interrogated again today.  No further adjustments were necessary.  However, when she is V paced she has significant chest pain.  Her symptoms were essentially reproduced.  I suspect some of her chest pain is related to V pacing.  She can review this further with Dr. Thompson Grayer when she is seen in follow up.   2. Syncope:  She has a hx of orthostatic hypotension.  She has been on Florinef.  Her symptoms are overall stable.   3. Atrial Fibrillation:  Maintaining NSR.  She is tolerating coumadin.  She remains on Tikosyn.   4. Acute Kidney Injury:  Creatinine was increased in the hospital.  Repeat BMET today.  5. S/p Pacemaker:  As noted, she has CP with V pacing.  She has intermittent CHB and her device setting were changed to allow her to V pace when needed.  F/u with EP as planned.  6. Disposition:  F/u with Dr. Peter [name] and Dr. Thompson Grayer as planned.   Signed, Richardson Dopp, PA-C  05/19/2013 12:13 PM

## 2013-06-09 ENCOUNTER — Ambulatory Visit (INDEPENDENT_AMBULATORY_CARE_PROVIDER_SITE_OTHER): Payer: Medicare Other | Admitting: Cardiology

## 2013-06-09 ENCOUNTER — Encounter: Payer: Self-pay | Admitting: Cardiology

## 2013-06-09 ENCOUNTER — Ambulatory Visit (INDEPENDENT_AMBULATORY_CARE_PROVIDER_SITE_OTHER): Payer: Medicare Other | Admitting: *Deleted

## 2013-06-09 VITALS — BP 120/66 | HR 64 | Ht 65.0 in | Wt 114.0 lb

## 2013-06-09 DIAGNOSIS — I442 Atrioventricular block, complete: Secondary | ICD-10-CM

## 2013-06-09 DIAGNOSIS — I495 Sick sinus syndrome: Secondary | ICD-10-CM

## 2013-06-09 DIAGNOSIS — I4891 Unspecified atrial fibrillation: Secondary | ICD-10-CM

## 2013-06-09 DIAGNOSIS — Z5181 Encounter for therapeutic drug level monitoring: Secondary | ICD-10-CM | POA: Insufficient documentation

## 2013-06-09 DIAGNOSIS — R55 Syncope and collapse: Secondary | ICD-10-CM | POA: Diagnosis not present

## 2013-06-09 HISTORY — DX: Atrioventricular block, complete: I44.2

## 2013-06-09 LAB — POCT INR: INR: 2

## 2013-06-09 NOTE — Patient Instructions (Signed)
Continue your current therapy  I will see you in 4 months  

## 2013-06-09 NOTE — Progress Notes (Signed)
Katrina Rodriguez Date of Birth: February 07, 1929 Medical Record #[id number]  History of Present Illness: Katrina Rodriguez is seen today for a followup visit. She has a history of atrial fibrillation. She is on chronic Tikosyn therapy. She has had a previous atrial fibrillation ablation. She was admitted to the hospital in February 2015 with recurrent syncope. She was noted to have intermittent complete heart block. Her pacemaker was reprogrammed to DDD with AV search to minimize V pacing.  She has a history of chronic chest pain with normal coronary anatomy by cardiac catheterization. She had a normal nuclear stress test 2/13. She has known sensitivity with V pacing with chest pain. She noted intermittent sharp chest pain with pacing that may awaken her from sleep. Also noted pain and nausea with walking. She is now wearing support hose.  Current Outpatient Prescriptions on File Prior to Visit  Medication Sig Dispense Refill  . dofetilide (TIKOSYN) 250 MCG capsule Take 1 capsule (250 mcg total) by mouth 2 (two) times daily.  180 capsule  3  . fludrocortisone (FLORINEF) 0.1 MG tablet Take 2 tablets (0.2 mg) twice a day  360 tablet  0  . potassium chloride SA (K-DUR,KLOR-CON) 20 MEQ tablet Take 2 tablets (40 mEq total) by mouth daily.  180 tablet  0  . warfarin (COUMADIN) 4 MG tablet Take 2-4 mg by mouth daily. 4mg  on Mon., Wed., & Fri., all other days take 2mg  including Sundays.       No current facility-administered medications on file prior to visit.    Allergies  Allergen Reactions  . Amiodarone Other (See Comments)    Caused thyroid problems  . Amoxicillin Hives  . Darvocet [Propoxyphene N-Acetaminophen] Hives  . Demerol Itching  . Penicillins Hives    Past Medical History  Diagnosis Date  . Thrombocytopenia   . H/O: CVA (cardiovascular accident)     Right internal capsule  . Orthostasis   . Anticoagulant long-term use   . Pacemaker   . Tachycardia-bradycardia syndrome   . Mild pulmonary  hypertension   . Chronic anemia   . H/O syncope   . Chest pain     Has normal coronaries per cath in 2005; negative nuclear in 2010  . Atrial fibrillation     History of ablation - Dr. Rayann Heman  . Hyperthyroidism   . Retinal detachment   . High risk medication use     Tikosyn & Coumadin  . Arthritis     Past Surgical History  Procedure Laterality Date  . Cardiac pacemaker placement    . Cardiac catheterization    . Retinal detachment surgery      History  Smoking status  . Never Smoker   Smokeless tobacco  . Never Used    History  Alcohol Use No    Family History  Problem Relation Age of Onset  . Heart disease Father   . Heart disease Brother   . Heart disease Brother     Review of Systems: The review of systems is positive for chronic vision loss.  All other systems were reviewed and are negative.  Physical Exam: BP 120/66  Pulse 64  Ht 5\' 5"  (1.651 m)  Wt 114 lb (51.71 kg)  BMI 18.97 kg/m2 Patient is very pleasant and in no acute distress. Skin is warm and dry. Color is normal.  HEENT is unremarkable. Normocephalic/atraumatic. PERRL. Sclera are nonicteric. Neck is supple. No masses. No JVD. Lungs are clear. Her pacemaker site has healed well. Cardiac exam  shows a regular rate and rhythm. Abdomen is soft. Extremities are without edema. Gait and ROM are intact but she is a little unsteady. No gross neurologic deficits noted.   LABORATORY DATA:  Lab Results  Component Value Date   WBC 5.5 04/24/2013   HGB 14.2 04/24/2013   HCT 41.2 04/24/2013   PLT 128* 04/24/2013   GLUCOSE 98 05/19/2013   CHOL 183 06/12/2012   TRIG 103 06/12/2012   HDL 49 06/12/2012   LDLCALC 113* 06/12/2012   ALT 11 12/21/2009   AST 18 12/21/2009   NA 140 05/19/2013   K 4.3 05/19/2013   CL 104 05/19/2013   CREATININE 1.2 05/19/2013   BUN 22 05/19/2013   CO2 29 05/19/2013   TSH 1.04 09/16/2012   INR 2.0 06/09/2013   HGBA1C 5.8* 06/12/2012   ECG 05/19/13: NSR-A paced, QTc 482 msec.  Assessment /  Plan: 1. Atrial fibrillation. She appears to be maintaining sinus rhythm while on Tikosyn. Status post ablation. With prior CVA she needs to remain on anticoagulation. Currently on Coumadin. She was unable to afford the novel anticoagulants.  2. Chronic chest pain. Ischemic evaluation has been negative. Now with chest pain related to V pacing.   3. History of CVA.  4. Sick sinus syndrome status post pacemaker implant. Now with intermittent complete heart block. Patient has regular followup in pacemaker clinic.  5. Recurrent syncope, multifactorial. She is on chronic Florinef. I have encouraged her to wear support hose. Maintain good hydration and liberalize sodium intake.

## 2013-06-10 ENCOUNTER — Encounter: Payer: Self-pay | Admitting: Internal Medicine

## 2013-06-16 DIAGNOSIS — H04129 Dry eye syndrome of unspecified lacrimal gland: Secondary | ICD-10-CM | POA: Diagnosis not present

## 2013-06-16 DIAGNOSIS — H18519 Endothelial corneal dystrophy, unspecified eye: Secondary | ICD-10-CM | POA: Diagnosis not present

## 2013-06-16 DIAGNOSIS — H31099 Other chorioretinal scars, unspecified eye: Secondary | ICD-10-CM | POA: Diagnosis not present

## 2013-06-16 DIAGNOSIS — H4720 Unspecified optic atrophy: Secondary | ICD-10-CM | POA: Diagnosis not present

## 2013-06-16 DIAGNOSIS — D3130 Benign neoplasm of unspecified choroid: Secondary | ICD-10-CM | POA: Diagnosis not present

## 2013-06-16 DIAGNOSIS — H18599 Other hereditary corneal dystrophies, unspecified eye: Secondary | ICD-10-CM | POA: Diagnosis not present

## 2013-06-30 ENCOUNTER — Encounter: Payer: Medicare Other | Admitting: Internal Medicine

## 2013-07-11 ENCOUNTER — Ambulatory Visit (INDEPENDENT_AMBULATORY_CARE_PROVIDER_SITE_OTHER): Payer: Medicare Other | Admitting: Internal Medicine

## 2013-07-11 ENCOUNTER — Ambulatory Visit (INDEPENDENT_AMBULATORY_CARE_PROVIDER_SITE_OTHER): Payer: Medicare Other

## 2013-07-11 ENCOUNTER — Encounter: Payer: Self-pay | Admitting: Internal Medicine

## 2013-07-11 VITALS — BP 134/71 | HR 60 | Ht 65.0 in | Wt 113.8 lb

## 2013-07-11 DIAGNOSIS — I4891 Unspecified atrial fibrillation: Secondary | ICD-10-CM | POA: Diagnosis not present

## 2013-07-11 DIAGNOSIS — Z950 Presence of cardiac pacemaker: Secondary | ICD-10-CM | POA: Diagnosis not present

## 2013-07-11 DIAGNOSIS — I495 Sick sinus syndrome: Secondary | ICD-10-CM | POA: Diagnosis not present

## 2013-07-11 DIAGNOSIS — I951 Orthostatic hypotension: Secondary | ICD-10-CM

## 2013-07-11 DIAGNOSIS — I498 Other specified cardiac arrhythmias: Secondary | ICD-10-CM | POA: Diagnosis not present

## 2013-07-11 DIAGNOSIS — R001 Bradycardia, unspecified: Secondary | ICD-10-CM

## 2013-07-11 DIAGNOSIS — Z5181 Encounter for therapeutic drug level monitoring: Secondary | ICD-10-CM

## 2013-07-11 LAB — POCT INR: INR: 1.6

## 2013-07-11 NOTE — Patient Instructions (Addendum)
Your physician recommends that you return for lab work in 2 weeks: BMET, Magnesium  Remote monitoring is used to monitor your pacemaker from home. This monitoring reduces the number of office visits required to check your device to one time per year. It allows [place] to keep an eye on the functioning of your device to ensure it is working properly. You are scheduled for a device check from home on 10-13-2013. You may send your transmission at any time that day. If you have a wireless device, the transmission will be sent automatically. After your physician reviews your transmission, you will receive a postcard with your next transmission date.  Your physician recommends that you schedule a follow-up appointment in: 12 months with Dr.Allred

## 2013-07-14 ENCOUNTER — Other Ambulatory Visit: Payer: Self-pay

## 2013-07-14 MED ORDER — FLUDROCORTISONE ACETATE 0.1 MG PO TABS: ORAL_TABLET | ORAL | Status: DC

## 2013-07-14 MED ORDER — POTASSIUM CHLORIDE CRYS ER 20 MEQ PO TBCR: 40.0000 meq | EXTENDED_RELEASE_TABLET | Freq: Every day | ORAL | Status: DC

## 2013-07-21 NOTE — Progress Notes (Signed)
PCP: [name], MD Primary Cardiologist: Peter [name], MD  Katrina Rodriguez is a 80 y.o. female who presents today for routine electrophysiology followup.  She seems to be doing ok at this time.  She has ongoing difficulty with her vision as well as postural dizziness/ presyncope.  Today, she denies symptoms of palpitations, chest pain, shortness of breath, or  lower extremity edema.  The patient is otherwise without complaint today.   Past Medical History  Diagnosis Date  . Thrombocytopenia   . H/O: CVA (cardiovascular accident)     Right internal capsule  . Orthostasis   . Anticoagulant long-term use   . Pacemaker   . Tachycardia-bradycardia syndrome   . Mild pulmonary hypertension   . Chronic anemia   . H/O syncope   . Chest pain     Has normal coronaries per cath in 2005; negative nuclear in 2010  . Atrial fibrillation     History of ablation - Dr. Rayann Heman  . Hyperthyroidism   . Retinal detachment   . High risk medication use     Tikosyn & Coumadin  . Arthritis    Past Surgical History  Procedure Laterality Date  . Cardiac pacemaker placement    . Cardiac catheterization    . Retinal detachment surgery      Current Outpatient Prescriptions  Medication Sig Dispense Refill  . dofetilide (TIKOSYN) 250 MCG capsule Take 1 capsule (250 mcg total) by mouth 2 (two) times daily.  180 capsule  3  . warfarin (COUMADIN) 4 MG tablet Take 2-4 mg by mouth daily. 4mg  on Mon., Wed., & Fri., all other days take 2mg  including Sundays.      . fludrocortisone (FLORINEF) 0.1 MG tablet Take 2 tablets (0.2 mg) twice a day  360 tablet  2  . potassium chloride SA (K-DUR,KLOR-CON) 20 MEQ tablet Take 2 tablets (40 mEq total) by mouth daily.  180 tablet  3   No current facility-administered medications for this visit.    Physical Exam: Filed Vitals:   07/11/13 1217  BP: 134/71  Pulse: 60  Height: 5\' 5"  (1.651 m)  Weight: 113 lb 12.8 oz (51.619 kg)    GEN- The patient is well  appearing, alert and oriented x 3 today.   Head- normocephalic, atraumatic Eyes-  Sclera clear, conjunctiva pink Ears- hearing intact Oropharynx- clear Lungs- Clear to ausculation bilaterally, normal work of breathing Chest- pacemaker pocket is well healed Heart- Regular rate and rhythm, no murmurs, rubs or gallops, PMI not laterally displaced GI- soft, NT, ND, + BS Extremities- no clubbing, cyanosis, or edema  Pacemaker interrogation- reviewed in detail today,  See PACEART report ekg today reveals atrial pacing at 60 bpm, PR 174, QTc 484, otherwise normal ekg  Assessment and Plan:  1. Postural dizziness/ presyncope/ syncope Continue florinef Oral hydration is encouraged Support hose are encouraged though she is reluctant to wear them Could consider midodrine, though I am not sure that she would tolerate this  2. Bradycardia Device function is normal Her atrial lead function appears to have recovered Device adjusted to minimize pacing.  3. HTN Stable No change required today  4. afib Continue coumadin Bmet, mg to be followed by Dr [name] every 6 months  [facility] Return to see me in 12 months

## 2013-07-25 ENCOUNTER — Other Ambulatory Visit: Payer: Medicare Other

## 2013-07-25 ENCOUNTER — Ambulatory Visit (INDEPENDENT_AMBULATORY_CARE_PROVIDER_SITE_OTHER): Payer: Medicare Other

## 2013-07-25 ENCOUNTER — Encounter: Payer: Self-pay | Admitting: Internal Medicine

## 2013-07-25 ENCOUNTER — Other Ambulatory Visit: Payer: Self-pay

## 2013-07-25 DIAGNOSIS — I4891 Unspecified atrial fibrillation: Secondary | ICD-10-CM | POA: Diagnosis not present

## 2013-07-25 DIAGNOSIS — Z5181 Encounter for therapeutic drug level monitoring: Secondary | ICD-10-CM

## 2013-07-25 LAB — BASIC METABOLIC PANEL
BUN: 17 mg/dL (ref 6–23)
CALCIUM: 8.7 mg/dL (ref 8.4–10.5)
CO2: 30 meq/L (ref 19–32)
Chloride: 103 mEq/L (ref 96–112)
Creatinine, Ser: 1.2 mg/dL (ref 0.4–1.2)
GFR: 44.73 mL/min — ABNORMAL LOW (ref >60.00)
Glucose, Bld: 87 mg/dL (ref 70–99)
Potassium: 4 mEq/L (ref 3.5–5.1)
SODIUM: 140 meq/L (ref 135–145)

## 2013-07-25 LAB — MAGNESIUM: MAGNESIUM: 2.4 mg/dL (ref 1.5–2.5)

## 2013-07-25 LAB — POCT INR: INR: 2.5

## 2013-08-15 ENCOUNTER — Ambulatory Visit (INDEPENDENT_AMBULATORY_CARE_PROVIDER_SITE_OTHER): Payer: Medicare Other

## 2013-08-15 DIAGNOSIS — Z5181 Encounter for therapeutic drug level monitoring: Secondary | ICD-10-CM

## 2013-08-15 DIAGNOSIS — I4891 Unspecified atrial fibrillation: Secondary | ICD-10-CM

## 2013-08-15 LAB — POCT INR: INR: 2.4

## 2013-08-15 MED ORDER — WARFARIN SODIUM 4 MG PO TABS: ORAL_TABLET | ORAL | Status: DC

## 2013-09-03 ENCOUNTER — Other Ambulatory Visit: Payer: Self-pay

## 2013-09-03 ENCOUNTER — Telehealth: Payer: Self-pay | Admitting: Cardiology

## 2013-09-03 MED ORDER — DOFETILIDE 250 MCG PO CAPS: 250.0000 ug | ORAL_CAPSULE | Freq: Two times a day (BID) | ORAL | Status: DC

## 2013-09-03 NOTE — Telephone Encounter (Signed)
Need a refill on her Tikosyn 250 mg #90.Please call to [facility].

## 2013-09-12 ENCOUNTER — Ambulatory Visit (INDEPENDENT_AMBULATORY_CARE_PROVIDER_SITE_OTHER): Payer: Medicare Other | Admitting: Pharmacist

## 2013-09-12 DIAGNOSIS — I4891 Unspecified atrial fibrillation: Secondary | ICD-10-CM

## 2013-09-12 DIAGNOSIS — Z5181 Encounter for therapeutic drug level monitoring: Secondary | ICD-10-CM

## 2013-09-12 LAB — POCT INR: INR: 3

## 2013-10-13 ENCOUNTER — Ambulatory Visit (INDEPENDENT_AMBULATORY_CARE_PROVIDER_SITE_OTHER): Payer: Medicare Other | Admitting: Pharmacist Clinician (PhC)/ Clinical Pharmacy Specialist

## 2013-10-13 ENCOUNTER — Ambulatory Visit: Payer: Medicare Other | Admitting: Cardiology

## 2013-10-13 DIAGNOSIS — Z5181 Encounter for therapeutic drug level monitoring: Secondary | ICD-10-CM

## 2013-10-13 DIAGNOSIS — I4891 Unspecified atrial fibrillation: Secondary | ICD-10-CM

## 2013-10-13 LAB — POCT INR: INR: 2.3

## 2013-11-14 ENCOUNTER — Ambulatory Visit (INDEPENDENT_AMBULATORY_CARE_PROVIDER_SITE_OTHER): Payer: Medicare Other | Admitting: Pharmacist Clinician (PhC)/ Clinical Pharmacy Specialist

## 2013-11-14 DIAGNOSIS — I4891 Unspecified atrial fibrillation: Secondary | ICD-10-CM

## 2013-11-14 DIAGNOSIS — Z5181 Encounter for therapeutic drug level monitoring: Secondary | ICD-10-CM

## 2013-11-14 LAB — POCT INR: INR: 3.5

## 2013-12-04 ENCOUNTER — Ambulatory Visit (INDEPENDENT_AMBULATORY_CARE_PROVIDER_SITE_OTHER): Payer: Medicare Other | Admitting: Cardiology

## 2013-12-04 ENCOUNTER — Ambulatory Visit (INDEPENDENT_AMBULATORY_CARE_PROVIDER_SITE_OTHER): Payer: Medicare Other | Admitting: *Deleted

## 2013-12-04 ENCOUNTER — Encounter: Payer: Self-pay | Admitting: Internal Medicine

## 2013-12-04 ENCOUNTER — Telehealth: Payer: Self-pay | Admitting: Cardiology

## 2013-12-04 ENCOUNTER — Encounter: Payer: Self-pay | Admitting: Cardiology

## 2013-12-04 VITALS — BP 122/70 | HR 56 | Ht 65.0 in | Wt 114.0 lb

## 2013-12-04 DIAGNOSIS — I4891 Unspecified atrial fibrillation: Secondary | ICD-10-CM

## 2013-12-04 DIAGNOSIS — I495 Sick sinus syndrome: Secondary | ICD-10-CM | POA: Diagnosis not present

## 2013-12-04 DIAGNOSIS — R55 Syncope and collapse: Secondary | ICD-10-CM | POA: Diagnosis not present

## 2013-12-04 DIAGNOSIS — I480 Paroxysmal atrial fibrillation: Secondary | ICD-10-CM

## 2013-12-04 DIAGNOSIS — Z8673 Personal history of transient ischemic attack (TIA), and cerebral infarction without residual deficits: Secondary | ICD-10-CM | POA: Diagnosis not present

## 2013-12-04 DIAGNOSIS — I951 Orthostatic hypotension: Secondary | ICD-10-CM

## 2013-12-04 DIAGNOSIS — Z5181 Encounter for therapeutic drug level monitoring: Secondary | ICD-10-CM

## 2013-12-04 LAB — BASIC METABOLIC PANEL
BUN: 17 mg/dL (ref 6–23)
CALCIUM: 8.7 mg/dL (ref 8.4–10.5)
CO2: 27 mEq/L (ref 19–32)
Chloride: 105 mEq/L (ref 96–112)
Creat: 1.15 mg/dL — ABNORMAL HIGH (ref 0.50–1.10)
Glucose, Bld: 80 mg/dL (ref 70–99)
Potassium: 4.4 mEq/L (ref 3.5–5.3)
Sodium: 140 mEq/L (ref 135–145)

## 2013-12-04 LAB — MDC_IDC_ENUM_SESS_TYPE_REMOTE
Battery Remaining Longevity: 136 mo
Battery Voltage: 2.79 V
Brady Statistic AP VS Percent: 54 %
Brady Statistic AS VP Percent: 0 %
Brady Statistic AS VS Percent: 46 %
Date Time Interrogation Session: 20151001200858
Lead Channel Impedance Value: 626 Ohm
Lead Channel Pacing Threshold Amplitude: 0.5 V
Lead Channel Pacing Threshold Amplitude: 0.625 V
Lead Channel Pacing Threshold Pulse Width: 0.4 ms
Lead Channel Sensing Intrinsic Amplitude: 16 mV
Lead Channel Setting Pacing Amplitude: 2.5 V
Lead Channel Setting Sensing Sensitivity: 5.6 mV
MDC IDC MSMT BATTERY IMPEDANCE: 148 Ohm
MDC IDC MSMT LEADCHNL RA IMPEDANCE VALUE: 1093 Ohm
MDC IDC MSMT LEADCHNL RV PACING THRESHOLD PULSEWIDTH: 0.4 ms
MDC IDC SET LEADCHNL RA PACING AMPLITUDE: 2 V
MDC IDC SET LEADCHNL RV PACING PULSEWIDTH: 0.4 ms
MDC IDC STAT BRADY AP VP PERCENT: 0 %

## 2013-12-04 LAB — MAGNESIUM: Magnesium: 2.3 mg/dL (ref 1.5–2.5)

## 2013-12-04 LAB — POCT INR: INR: 3.3

## 2013-12-04 NOTE — Patient Instructions (Signed)
Continue your current therapy  We will check lab work today  I will see you in 4 months.

## 2013-12-04 NOTE — Telephone Encounter (Signed)
LMOVM reminding pt to send remote transmission.   

## 2013-12-04 NOTE — Progress Notes (Signed)
Katrina Rodriguez Date of Birth: Jan 07, 1929 Medical Record #[id number]  History of Present Illness: Katrina Rodriguez is seen today for a followup visit. She has a history of atrial fibrillation. She is on chronic Tikosyn therapy. She has had a previous atrial fibrillation ablation. She has a history of recurrent syncope. She was noted to have intermittent complete heart block. Her pacemaker was reprogrammed to DDD with AV search to minimize V pacing.  She has a history of chronic chest pain with normal coronary anatomy by cardiac catheterization. She had a normal nuclear stress test 2/13. She has known sensitivity with V pacing with chest pain. She still has intermittent chest pain. She has some bad weeks where she almost passes out when she stoops over. Sometimes gets really weak and SOB. No swelling. She has now moved to a Townhouse on [address].  Current Outpatient Prescriptions on File Prior to Visit  Medication Sig Dispense Refill  . dofetilide (TIKOSYN) 250 MCG capsule Take 1 capsule (250 mcg total) by mouth 2 (two) times daily.  180 capsule  3  . fludrocortisone (FLORINEF) 0.1 MG tablet Take 2 tablets (0.2 mg) twice a day  360 tablet  2  . potassium chloride SA (K-DUR,KLOR-CON) 20 MEQ tablet Take 2 tablets (40 mEq total) by mouth daily.  180 tablet  3  . warfarin (COUMADIN) 4 MG tablet Take as directed by anticoagulation clinic  90 tablet  1   No current facility-administered medications on file prior to visit.    Allergies  Allergen Reactions  . Amiodarone Other (See Comments)    Caused thyroid problems  . Amoxicillin Hives  . Darvocet [Propoxyphene N-Acetaminophen] Hives  . Demerol Itching  . Penicillins Hives    Past Medical History  Diagnosis Date  . Thrombocytopenia   . H/O: CVA (cardiovascular accident)     Right internal capsule  . Orthostasis   . Anticoagulant long-term use   . Pacemaker   . Tachycardia-bradycardia syndrome   . Mild pulmonary hypertension   . Chronic anemia    . H/O syncope   . Chest pain     Has normal coronaries per cath in 2005; negative nuclear in 2010  . Atrial fibrillation     History of ablation - Dr. Rayann Heman  . Hyperthyroidism   . Retinal detachment   . High risk medication use     Tikosyn & Coumadin  . Arthritis     Past Surgical History  Procedure Laterality Date  . Cardiac pacemaker placement    . Cardiac catheterization    . Retinal detachment surgery      History  Smoking status  . Never Smoker   Smokeless tobacco  . Never Used    History  Alcohol Use No    Family History  Problem Relation Age of Onset  . Heart disease Father   . Heart disease Brother   . Heart disease Brother     Review of Systems: The review of systems is positive for chronic vision loss.  All other systems were reviewed and are negative.  Physical Exam: BP 122/70  Pulse 56  Ht 5\' 5"  (1.651 m)  Wt 114 lb (51.71 kg)  BMI 18.97 kg/m2 Patient is very pleasant and in no acute distress. Skin is warm and dry. Color is normal.  HEENT is unremarkable. Normocephalic/atraumatic. PERRL. Sclera are nonicteric. Neck is supple. No masses. No JVD. Lungs are clear. Her pacemaker site is OK. Cardiac exam shows a regular rate and rhythm. No  murmur or S3. Abdomen is soft. Extremities are without edema. Gait and ROM are intact but she is a little unsteady. No gross neurologic deficits noted.   LABORATORY DATA:  Lab Results  Component Value Date   WBC 5.5 04/24/2013   HGB 14.2 04/24/2013   HCT 41.2 04/24/2013   PLT 128* 04/24/2013   GLUCOSE 87 07/25/2013   CHOL 183 06/12/2012   TRIG 103 06/12/2012   HDL 49 06/12/2012   LDLCALC 113* 06/12/2012   ALT 11 12/21/2009   AST 18 12/21/2009   NA 140 07/25/2013   K 4.0 07/25/2013   CL 103 07/25/2013   CREATININE 1.2 07/25/2013   BUN 17 07/25/2013   CO2 30 07/25/2013   TSH 1.04 09/16/2012   INR 3.5 11/14/2013   HGBA1C 5.8* 06/12/2012     Assessment / Plan: 1. Atrial fibrillation. She appears to be maintaining sinus  rhythm while on Tikosyn. Status post ablation. With prior CVA she needs to remain on anticoagulation. Currently on Coumadin. She was unable to afford the novel anticoagulants. Will check INR today. Will check BMET and magnesium on Tikosyn. Needs pacer check with device clinic.  2. Chronic chest pain. Ischemic evaluation has been negative. Some chest pain related to V pacing.   3. History of CVA. On chronic coumadin.  4. Sick sinus syndrome status post pacemaker implant. Now with intermittent complete heart block. Patient has regular followup in pacemaker clinic.  5. Recurrent syncope, multifactorial. She is on chronic Florinef. I have encouraged her to wear support hose. Maintain good hydration and liberalize sodium intake.

## 2013-12-05 NOTE — Progress Notes (Signed)
Remote pacemaker transmission.   

## 2013-12-09 ENCOUNTER — Encounter (HOSPITAL_COMMUNITY): Payer: Self-pay | Admitting: Emergency Medicine

## 2013-12-09 ENCOUNTER — Emergency Department (HOSPITAL_COMMUNITY)
Admission: EM | Admit: 2013-12-09 | Discharge: 2013-12-09 | Disposition: A | Discharge status: Home / Self Care | Payer: Medicare Other | Attending: Emergency Medicine | Admitting: Emergency Medicine

## 2013-12-09 ENCOUNTER — Emergency Department (HOSPITAL_COMMUNITY): Payer: Medicare Other

## 2013-12-09 ENCOUNTER — Other Ambulatory Visit: Payer: Self-pay

## 2013-12-09 DIAGNOSIS — Z880 Allergy status to penicillin: Secondary | ICD-10-CM | POA: Diagnosis not present

## 2013-12-09 DIAGNOSIS — I270 Primary pulmonary hypertension: Secondary | ICD-10-CM | POA: Insufficient documentation

## 2013-12-09 DIAGNOSIS — R55 Syncope and collapse: Secondary | ICD-10-CM | POA: Diagnosis not present

## 2013-12-09 DIAGNOSIS — R0789 Other chest pain: Secondary | ICD-10-CM | POA: Diagnosis not present

## 2013-12-09 DIAGNOSIS — Z8669 Personal history of other diseases of the nervous system and sense organs: Secondary | ICD-10-CM | POA: Insufficient documentation

## 2013-12-09 DIAGNOSIS — G44319 Acute post-traumatic headache, not intractable: Secondary | ICD-10-CM

## 2013-12-09 DIAGNOSIS — S199XXA Unspecified injury of neck, initial encounter: Secondary | ICD-10-CM | POA: Diagnosis not present

## 2013-12-09 DIAGNOSIS — Y9389 Activity, other specified: Secondary | ICD-10-CM | POA: Insufficient documentation

## 2013-12-09 DIAGNOSIS — Z79899 Other long term (current) drug therapy: Secondary | ICD-10-CM | POA: Insufficient documentation

## 2013-12-09 DIAGNOSIS — I4891 Unspecified atrial fibrillation: Secondary | ICD-10-CM | POA: Diagnosis not present

## 2013-12-09 DIAGNOSIS — Z950 Presence of cardiac pacemaker: Secondary | ICD-10-CM | POA: Insufficient documentation

## 2013-12-09 DIAGNOSIS — W01198A Fall on same level from slipping, tripping and stumbling with subsequent striking against other object, initial encounter: Secondary | ICD-10-CM | POA: Insufficient documentation

## 2013-12-09 DIAGNOSIS — R079 Chest pain, unspecified: Secondary | ICD-10-CM | POA: Diagnosis not present

## 2013-12-09 DIAGNOSIS — Z7901 Long term (current) use of anticoagulants: Secondary | ICD-10-CM | POA: Insufficient documentation

## 2013-12-09 DIAGNOSIS — M542 Cervicalgia: Secondary | ICD-10-CM | POA: Diagnosis not present

## 2013-12-09 DIAGNOSIS — R51 Headache: Secondary | ICD-10-CM | POA: Diagnosis not present

## 2013-12-09 DIAGNOSIS — S0990XA Unspecified injury of head, initial encounter: Secondary | ICD-10-CM | POA: Diagnosis not present

## 2013-12-09 DIAGNOSIS — S20309A Unspecified superficial injuries of unspecified front wall of thorax, initial encounter: Secondary | ICD-10-CM | POA: Diagnosis not present

## 2013-12-09 DIAGNOSIS — S79912A Unspecified injury of left hip, initial encounter: Secondary | ICD-10-CM | POA: Diagnosis not present

## 2013-12-09 DIAGNOSIS — W19XXXA Unspecified fall, initial encounter: Secondary | ICD-10-CM

## 2013-12-09 DIAGNOSIS — S7002XA Contusion of left hip, initial encounter: Secondary | ICD-10-CM | POA: Insufficient documentation

## 2013-12-09 DIAGNOSIS — Z862 Personal history of diseases of the blood and blood-forming organs and certain disorders involving the immune mechanism: Secondary | ICD-10-CM | POA: Insufficient documentation

## 2013-12-09 DIAGNOSIS — Z8739 Personal history of other diseases of the musculoskeletal system and connective tissue: Secondary | ICD-10-CM | POA: Diagnosis not present

## 2013-12-09 DIAGNOSIS — Y92018 Other place in single-family (private) house as the place of occurrence of the external cause: Secondary | ICD-10-CM | POA: Diagnosis not present

## 2013-12-09 DIAGNOSIS — M25552 Pain in left hip: Secondary | ICD-10-CM | POA: Diagnosis not present

## 2013-12-09 DIAGNOSIS — S299XXA Unspecified injury of thorax, initial encounter: Secondary | ICD-10-CM | POA: Diagnosis not present

## 2013-12-09 DIAGNOSIS — S1090XA Unspecified superficial injury of unspecified part of neck, initial encounter: Secondary | ICD-10-CM | POA: Diagnosis not present

## 2013-12-09 LAB — CBC
HEMATOCRIT: 41.6 % (ref 36.0–46.0)
Hemoglobin: 14 g/dL (ref 12.0–15.0)
MCH: 31.7 pg (ref 26.0–34.0)
MCHC: 33.7 g/dL (ref 30.0–36.0)
MCV: 94.1 fL (ref 78.0–100.0)
PLATELETS: 129 10*3/uL — AB (ref 150–400)
RBC: 4.42 MIL/uL (ref 3.87–5.11)
RDW: 13.3 % (ref 11.5–15.5)
WBC: 8.7 10*3/uL (ref 4.0–10.5)

## 2013-12-09 LAB — BASIC METABOLIC PANEL
ANION GAP: 12 (ref 5–15)
BUN: 24 mg/dL — AB (ref 6–23)
CALCIUM: 8.9 mg/dL (ref 8.4–10.5)
CHLORIDE: 103 meq/L (ref 96–112)
CO2: 26 mEq/L (ref 19–32)
CREATININE: 1.06 mg/dL (ref 0.50–1.10)
GFR calc Af Amer: 55 mL/min — ABNORMAL LOW (ref >90)
GFR calc non Af Amer: 47 mL/min — ABNORMAL LOW (ref >90)
Glucose, Bld: 93 mg/dL (ref 70–99)
Potassium: 3.8 mEq/L (ref 3.7–5.3)
Sodium: 141 mEq/L (ref 137–147)

## 2013-12-09 LAB — I-STAT TROPONIN, ED: Troponin i, poc: 0 ng/mL (ref 0.00–0.08)

## 2013-12-09 LAB — HEMOGLOBIN AND HEMATOCRIT, BLOOD
HCT: 42.3 % (ref 36.0–46.0)
Hemoglobin: 14 g/dL (ref 12.0–15.0)

## 2013-12-09 LAB — PROTIME-INR
INR: 1.78 — AB (ref 0.00–1.49)
PROTHROMBIN TIME: 20.7 s — AB (ref 11.6–15.2)

## 2013-12-09 MED ORDER — MORPHINE SULFATE 4 MG/ML IJ SOLN: 4.0000 mg | Freq: Once | INTRAMUSCULAR | Status: DC

## 2013-12-09 NOTE — ED Notes (Signed)
Fell ?~ 0430 - 2778 this morning onto her lt. Side and chest; c/o chest wall pain (rt. Lower).  Takes coumadin. Passes out when bp is HIGH And then drops suddenly.

## 2013-12-09 NOTE — ED Notes (Signed)
Pt returned from xray

## 2013-12-09 NOTE — ED Provider Notes (Signed)
CSN: [id number]     Arrival date & time 12/09/13  1761 History   First MD Initiated Contact with Patient 12/09/13 1154     Chief Complaint  Patient presents with  . Fall  . Near Syncope  . Chest Pain     (Consider location/radiation/quality/duration/timing/severity/associated sxs/prior Treatment) HPI Pt is an 80yo female with hx of afib with pacemaker in place and h/o syncope presenting to ED with c/o fall at home around 0430 this morning.  Pt states she thinks her blood pressure dropped too quickly as she has a hx of similar episodes of passing out when her BP drops. Pt states she did hit her head and left hip during the fall but is most concerned for left sided chest pain.  States she wants to make sure that her pace maker was not dislodged.  Pt states pain in left chest wall is worse with deep inspiration and palpation, sharp in nature, 8/10 at worst. Left hip pain is worse with ambulation and palpation, 5/10.  Pt states the back of her head near her neck is sore as well.  Denies numbness or tingling in arms or legs. Denies centralized chest pain or palpitations. Denies diaphoresis, nausea or vomiting.  Denies recent illness.  Past Medical History  Diagnosis Date  . Thrombocytopenia   . H/O: CVA (cardiovascular accident)     Right internal capsule  . Orthostasis   . Anticoagulant long-term use   . Pacemaker   . Tachycardia-bradycardia syndrome   . Mild pulmonary hypertension   . Chronic anemia   . H/O syncope   . Chest pain     Has normal coronaries per cath in 2005; negative nuclear in 2010  . Atrial fibrillation     History of ablation - Dr. Rayann Heman  . Hyperthyroidism   . Retinal detachment   . High risk medication use     Tikosyn & Coumadin  . Arthritis    Past Surgical History  Procedure Laterality Date  . Cardiac pacemaker placement    . Cardiac catheterization    . Retinal detachment surgery     Family History  Problem Relation Age of Onset  . Heart disease Father    . Heart disease Brother   . Heart disease Brother    History  Substance Use Topics  . Smoking status: Never Smoker   . Smokeless tobacco: Never Used  . Alcohol Use: No   OB History   Grav Para Term Preterm Abortions TAB SAB Ect Mult Living                 Review of Systems  Constitutional: Negative for fever and chills.  Cardiovascular: Positive for chest pain (left side chest wall/rib pain ). Negative for palpitations and leg swelling.  Gastrointestinal: Negative for nausea, vomiting and abdominal pain.  Musculoskeletal: Positive for arthralgias, gait problem, myalgias and neck pain. Negative for back pain, joint swelling and neck stiffness.       Left hip pain and bruising, pain with ambulation to left hip  Skin: Positive for color change. Negative for rash and wound.       Bruising to left hip and toes of left foot  Neurological: Positive for syncope (hx of orthostatic hypotension ), light-headedness and headaches. Negative for dizziness and weakness.  All other systems reviewed and are negative.     Allergies  Amiodarone; Amoxicillin; Aspirin; Codeine; Darvocet; Demerol; and Penicillins  Home Medications   Prior to Admission medications   Medication Sig  Start Date End Date Taking? Authorizing Provider  dofetilide (TIKOSYN) 250 MCG capsule Take 1 capsule (250 mcg total) by mouth 2 (two) times daily. 09/03/13  Yes Darlin Coco, MD  fludrocortisone (FLORINEF) 0.1 MG tablet Take 2 tablets (0.2 mg) twice a day 07/14/13  Yes Peter M [name], MD  potassium chloride SA (K-DUR,KLOR-CON) 20 MEQ tablet Take 2 tablets (40 mEq total) by mouth daily. 07/14/13  Yes Peter M [name], MD  warfarin (COUMADIN) 4 MG tablet Take 2-4 mg by mouth daily. 1/2 tablet (2 mg) on Sundays, Tuesdays, and Thursdays; 1 tablet (4 mg) on Mondays, Wednesdays, Fridays, and Saturdays.   Yes Historical Provider, MD   BP 129/59  Pulse 59  Temp(Src) 98.2 F (36.8 C) (Oral)  Resp 13  SpO2 98% Physical Exam   Nursing note and vitals reviewed. Constitutional: She is oriented to person, place, and time. She appears well-developed and well-nourished. No distress.  Elderly female lying in exam bed, NAD  HENT:  Head: Normocephalic and atraumatic.  Eyes: Conjunctivae and EOM are normal. Pupils are equal, round, and reactive to light. No scleral icterus.  Neck: Normal range of motion. Neck supple.  Mild tenderness at base of skull w/o midline cervical spinal tenderness. FROM cervical spine.   Cardiovascular: Normal rate, regular rhythm and normal heart sounds.   Regular rate and rhythm.   Pulmonary/Chest: Effort normal and breath sounds normal. No respiratory distress. She has no wheezes. She has no rales. She exhibits no tenderness.  Abdominal: Soft. Bowel sounds are normal. She exhibits no distension and no mass. There is no tenderness. There is no rebound and no guarding.  Musculoskeletal: Normal range of motion. She exhibits tenderness. She exhibits no edema.  Left hip: ecchymosis present with tenderness.  Decreased left hip flexion due to pain, no obvious deformity.    Neurological: She is alert and oriented to person, place, and time.  Alert and oriented to person place and time. Speech is fluent. Normal finger to nose coordination. Sensation to light touch in tact.  Skin: Skin is warm and dry. She is not diaphoretic.  Left hip: ecchymosis, tender to palpation.    ED Course  Procedures (including critical care time) Labs Review Labs Reviewed  CBC - Abnormal; Notable for the following:    Platelets 129 (*)    All other components within normal limits  BASIC METABOLIC PANEL - Abnormal; Notable for the following:    BUN 24 (*)    GFR calc non Af Amer 47 (*)    GFR calc Af Amer 55 (*)    All other components within normal limits  PROTIME-INR - Abnormal; Notable for the following:    Prothrombin Time 20.7 (*)    INR 1.78 (*)    All other components within normal limits  HEMOGLOBIN AND  HEMATOCRIT, BLOOD  I-STAT TROPOININ, ED    Imaging Review Dg Chest 2 View  12/09/2013   CLINICAL DATA:  Syncopal episode with fall; pain anterior left chest region  EXAM: CHEST  2 VIEW  COMPARISON:  April 24, 2013  FINDINGS: There is no edema or consolidation. Heart size and pulmonary vascularity are normal. Pacemaker leads are attached to the right atrium and middle cardiac vein. There is atherosclerotic change in aorta. No adenopathy. No pneumothorax. No fractures are apparent.  IMPRESSION: No edema or consolidation. No pneumothorax. No change in cardiac silhouette.   Electronically Signed   By: Lowella Grip M.D.   On: 12/09/2013 10:58     EKG  Interpretation None      MDM   Final diagnoses:  Fall, initial encounter  Chest wall pain  Acute post-traumatic headache, not intractable  Left hip pain    Pt presenting to ED with c/o fall she believes due to orthostatic hypotension as she reports hx of same. C/o pain in back of head, left side of chest and left hip.  Pt alert and oriented x3.  No focal neuro deficit.  Pt does have bony tenderness to left hip and left side chest wall pain c/o flail chest. Lungs: CTAB  Plain films and CT head and cervical spine: no acute findings. Discussed pt with Dr. Johnney Killian who also examined pt.  Orthostatic vital signs WNL. 4hr repeat Hgb/Hct still WNL. INR slightly below therapeutic level at 1.78.  Not concerned for emergent process taking place at this time. No further imaging indicated at this time. Return precautions provided. Pt verbalized understanding and agreement with tx plan.     Noland Fordyce, PA-C 12/09/13 [phone]

## 2013-12-09 NOTE — ED Notes (Signed)
Pt states she has fluctations in her BP and stood up too quickly and passed out, states she fell on her left side. Pain in left rib area hurts to breath in. Pain in left toes on foot, able to wiggle them. Pt also c/o sore neck. States she has a pacemaker and is worried she moved the pacer leads. Pt in NAD, ambulatory from wheelchair to bed. No obvious injuries or bruising noted. No injuries on head noted.

## 2013-12-09 NOTE — ED Provider Notes (Signed)
Medical screening examination/treatment/procedure(s) were conducted as a shared visit with non-physician practitioner(s) and myself.  I personally evaluated the patient during the encounter.   EKG Interpretation None     I had assessed the patient. Her main concern upon coming was for potential dislodgment of her pacemaker. At this point I see no evidence of that. A CT head and C-spine are negative. Patient is not having acute complaints of headache there is no mental status change. The patient is subtherapeutic on her INR at this point time. Patient does have some chest wall pain on the left there are no active contusions or abrasions at this point time. Chest x-ray does not show any acute findings. I have palpated deeply within the abdomen and she endorses no tenderness with pressure over the splenic area. At this point in time she appears to be of lower probability to have any intra-abdominal injury associated with the fall. We will reassess for a four-hour CBC and orthostatic vital signs to make sure there is no evidence of bleeding. Plan then will be for continued outpatient management for instructions to return with any worsening or changing of her condition.  Charlesetta Shanks, MD 12/09/13 [phone]

## 2013-12-15 ENCOUNTER — Encounter: Payer: Self-pay | Admitting: Cardiology

## 2013-12-16 NOTE — ED Provider Notes (Signed)
Medical screening examination/treatment/procedure(s) were conducted as a shared visit with non-physician practitioner(s) and myself.  I personally evaluated the patient during the encounter.   EKG Interpretation   Date/Time:  Tuesday December 09 2013 10:07:04 EDT Ventricular Rate:  67 PR Interval:  138 QRS Duration: 64 QT Interval:  464 QTC Calculation: 490 R Axis:   36 Text Interpretation:  Normal sinus rhythm Septal infarct , age  undetermined Abnormal ECG ED PHYSICIAN INTERPRETATION AVAILABLE IN [place]  [place] Confirmed by TEST, Record ([id number]) on 12/11/2013 7:39:40 AM       Charlesetta Shanks, MD 12/16/13 0130

## 2013-12-19 ENCOUNTER — Ambulatory Visit: Payer: Medicare Other | Admitting: Pharmacist Clinician (PhC)/ Clinical Pharmacy Specialist

## 2013-12-22 ENCOUNTER — Ambulatory Visit (INDEPENDENT_AMBULATORY_CARE_PROVIDER_SITE_OTHER): Payer: Medicare Other | Admitting: Pharmacist Clinician (PhC)/ Clinical Pharmacy Specialist

## 2013-12-22 DIAGNOSIS — Z5181 Encounter for therapeutic drug level monitoring: Secondary | ICD-10-CM | POA: Diagnosis not present

## 2013-12-22 DIAGNOSIS — I480 Paroxysmal atrial fibrillation: Secondary | ICD-10-CM

## 2013-12-22 DIAGNOSIS — Z23 Encounter for immunization: Secondary | ICD-10-CM | POA: Diagnosis not present

## 2013-12-22 DIAGNOSIS — I4891 Unspecified atrial fibrillation: Secondary | ICD-10-CM

## 2013-12-22 DIAGNOSIS — S20212D Contusion of left front wall of thorax, subsequent encounter: Secondary | ICD-10-CM | POA: Diagnosis not present

## 2013-12-22 DIAGNOSIS — R55 Syncope and collapse: Secondary | ICD-10-CM | POA: Diagnosis not present

## 2013-12-22 DIAGNOSIS — R296 Repeated falls: Secondary | ICD-10-CM | POA: Diagnosis not present

## 2013-12-22 LAB — POCT INR: INR: 3.4

## 2014-01-05 ENCOUNTER — Ambulatory Visit (INDEPENDENT_AMBULATORY_CARE_PROVIDER_SITE_OTHER): Payer: Medicare Other | Admitting: Pharmacist Clinician (PhC)/ Clinical Pharmacy Specialist

## 2014-01-05 DIAGNOSIS — I480 Paroxysmal atrial fibrillation: Secondary | ICD-10-CM | POA: Diagnosis not present

## 2014-01-05 DIAGNOSIS — Z5181 Encounter for therapeutic drug level monitoring: Secondary | ICD-10-CM

## 2014-01-05 DIAGNOSIS — I4891 Unspecified atrial fibrillation: Secondary | ICD-10-CM | POA: Diagnosis not present

## 2014-01-05 LAB — POCT INR: INR: 3

## 2014-02-02 ENCOUNTER — Ambulatory Visit (INDEPENDENT_AMBULATORY_CARE_PROVIDER_SITE_OTHER): Payer: Medicare Other | Admitting: Pharmacist Clinician (PhC)/ Clinical Pharmacy Specialist

## 2014-02-02 DIAGNOSIS — I480 Paroxysmal atrial fibrillation: Secondary | ICD-10-CM

## 2014-02-02 DIAGNOSIS — Z5181 Encounter for therapeutic drug level monitoring: Secondary | ICD-10-CM

## 2014-02-02 DIAGNOSIS — I4891 Unspecified atrial fibrillation: Secondary | ICD-10-CM

## 2014-02-02 LAB — POCT INR: INR: 1.9

## 2014-02-12 ENCOUNTER — Telehealth: Payer: Self-pay

## 2014-02-12 ENCOUNTER — Encounter (HOSPITAL_COMMUNITY): Payer: Self-pay | Admitting: Internal Medicine

## 2014-02-12 NOTE — Telephone Encounter (Signed)
Spoke to patient she stated medicare will not pay for a walker with chair.Stated her present walker has to be 78 years old before Medicare will pay.Stated she is not sure about walker with chair any way.Stated she call back if she decides she wants one.Marland Kitchen

## 2014-02-18 ENCOUNTER — Encounter: Payer: Self-pay | Admitting: Cardiology

## 2014-03-02 ENCOUNTER — Ambulatory Visit (INDEPENDENT_AMBULATORY_CARE_PROVIDER_SITE_OTHER): Payer: Medicare Other | Admitting: Pharmacist Clinician (PhC)/ Clinical Pharmacy Specialist

## 2014-03-02 DIAGNOSIS — I4891 Unspecified atrial fibrillation: Secondary | ICD-10-CM

## 2014-03-02 DIAGNOSIS — Z5181 Encounter for therapeutic drug level monitoring: Secondary | ICD-10-CM

## 2014-03-02 DIAGNOSIS — I480 Paroxysmal atrial fibrillation: Secondary | ICD-10-CM

## 2014-03-02 LAB — POCT INR: INR: 2.5

## 2014-03-10 ENCOUNTER — Ambulatory Visit (INDEPENDENT_AMBULATORY_CARE_PROVIDER_SITE_OTHER): Payer: Medicare Other | Admitting: *Deleted

## 2014-03-10 DIAGNOSIS — I480 Paroxysmal atrial fibrillation: Secondary | ICD-10-CM | POA: Diagnosis not present

## 2014-03-10 NOTE — Progress Notes (Signed)
Remote pacemaker transmission.   

## 2014-03-12 LAB — MDC_IDC_ENUM_SESS_TYPE_REMOTE
Battery Remaining Longevity: 136 mo
Brady Statistic AP VP Percent: 0 %
Brady Statistic AS VS Percent: 46 %
Date Time Interrogation Session: 20160105115438
Lead Channel Impedance Value: 1123 Ohm
Lead Channel Impedance Value: 676 Ohm
Lead Channel Pacing Threshold Pulse Width: 0.4 ms
Lead Channel Pacing Threshold Pulse Width: 0.4 ms
Lead Channel Setting Pacing Amplitude: 2 V
Lead Channel Setting Pacing Amplitude: 2.5 V
Lead Channel Setting Pacing Pulse Width: 0.4 ms
Lead Channel Setting Sensing Sensitivity: 5.6 mV
MDC IDC MSMT BATTERY IMPEDANCE: 148 Ohm
MDC IDC MSMT BATTERY VOLTAGE: 2.79 V
MDC IDC MSMT LEADCHNL RA PACING THRESHOLD AMPLITUDE: 0.5 V
MDC IDC MSMT LEADCHNL RV PACING THRESHOLD AMPLITUDE: 0.375 V
MDC IDC MSMT LEADCHNL RV SENSING INTR AMPL: 11.2 mV
MDC IDC STAT BRADY AP VS PERCENT: 53 %
MDC IDC STAT BRADY AS VP PERCENT: 0 %

## 2014-03-20 ENCOUNTER — Encounter: Payer: Self-pay | Admitting: Cardiology

## 2014-03-25 ENCOUNTER — Encounter: Payer: Self-pay | Admitting: Internal Medicine

## 2014-04-03 ENCOUNTER — Ambulatory Visit (INDEPENDENT_AMBULATORY_CARE_PROVIDER_SITE_OTHER): Payer: Medicare Other | Admitting: Cardiology

## 2014-04-03 ENCOUNTER — Other Ambulatory Visit: Payer: Self-pay

## 2014-04-03 ENCOUNTER — Ambulatory Visit (INDEPENDENT_AMBULATORY_CARE_PROVIDER_SITE_OTHER): Payer: Medicare Other | Admitting: Pharmacist Clinician (PhC)/ Clinical Pharmacy Specialist

## 2014-04-03 ENCOUNTER — Encounter: Payer: Self-pay | Admitting: Cardiology

## 2014-04-03 VITALS — BP 128/68 | HR 68 | Ht 65.0 in | Wt 115.1 lb

## 2014-04-03 DIAGNOSIS — I495 Sick sinus syndrome: Secondary | ICD-10-CM | POA: Diagnosis not present

## 2014-04-03 DIAGNOSIS — I442 Atrioventricular block, complete: Secondary | ICD-10-CM

## 2014-04-03 DIAGNOSIS — I951 Orthostatic hypotension: Secondary | ICD-10-CM

## 2014-04-03 DIAGNOSIS — R55 Syncope and collapse: Secondary | ICD-10-CM | POA: Diagnosis not present

## 2014-04-03 DIAGNOSIS — I480 Paroxysmal atrial fibrillation: Secondary | ICD-10-CM | POA: Diagnosis not present

## 2014-04-03 DIAGNOSIS — Z950 Presence of cardiac pacemaker: Secondary | ICD-10-CM | POA: Diagnosis not present

## 2014-04-03 DIAGNOSIS — I4891 Unspecified atrial fibrillation: Secondary | ICD-10-CM | POA: Diagnosis not present

## 2014-04-03 DIAGNOSIS — Z5181 Encounter for therapeutic drug level monitoring: Secondary | ICD-10-CM

## 2014-04-03 LAB — POCT INR: INR: 1.7

## 2014-04-03 NOTE — Patient Instructions (Signed)
Continue your current therapy  I will see you in 4 months with lab work

## 2014-04-03 NOTE — Progress Notes (Signed)
Katrina Rodriguez Date of Birth: Dec 23, 1928 Medical Record #[id number]  History of Present Illness: Katrina Rodriguez is seen today for follow up of syncope. She has a history of atrial fibrillation. She is on chronic Tikosyn therapy. She has had a previous atrial fibrillation ablation. She has a history of recurrent syncope. She was noted to have intermittent complete heart block. Her pacemaker was reprogrammed to DDD with AV search to minimize V pacing.  She has a history of chronic chest pain with normal coronary anatomy by cardiac catheterization. She had a normal nuclear stress test 2/13. She has known sensitivity with V pacing with chest pain.  She has now moved to a [address]house on [address]. She reports that she is still having fairly frequent episodes of blacking out. She gets a "toothache" in her chest that goes into her jaw and then she goes out. This is worse with any strain such as bending over. She is wearing support hose and eats a lot of salt.   Current Outpatient Prescriptions on File Prior to Visit  Medication Sig Dispense Refill  . dofetilide (TIKOSYN) 250 MCG capsule Take 1 capsule (250 mcg total) by mouth 2 (two) times daily. 180 capsule 3  . fludrocortisone (FLORINEF) 0.1 MG tablet Take 2 tablets (0.2 mg) twice a day 360 tablet 2  . potassium chloride SA (K-DUR,KLOR-CON) 20 MEQ tablet Take 2 tablets (40 mEq total) by mouth daily. 180 tablet 3  . warfarin (COUMADIN) 4 MG tablet Take 2-4 mg by mouth daily. 1/2 tablet (2 mg) on Sundays, Tuesdays, and Thursdays; 1 tablet (4 mg) on Mondays, Wednesdays, Fridays, and Saturdays.     No current facility-administered medications on file prior to visit.    Allergies  Allergen Reactions  . Amiodarone Other (See Comments)    Caused thyroid problems  . Amoxicillin Hives  . Aspirin Other (See Comments)    On tikosyn and warfarin  . Codeine Other (See Comments)    Causes BP to drop  . Darvocet [Propoxyphene N-Acetaminophen] Hives  . Demerol  Itching  . Penicillins Hives    Past Medical History  Diagnosis Date  . Thrombocytopenia   . H/O: CVA (cardiovascular accident)     Right internal capsule  . Orthostasis   . Anticoagulant long-term use   . Pacemaker   . Tachycardia-bradycardia syndrome   . Mild pulmonary hypertension   . Chronic anemia   . H/O syncope   . Chest pain     Has normal coronaries per cath in 2005; negative nuclear in 2010  . Atrial fibrillation     History of ablation - Dr. Rayann Heman  . Hyperthyroidism   . Retinal detachment   . High risk medication use     Tikosyn & Coumadin  . Arthritis     Past Surgical History  Procedure Laterality Date  . Cardiac pacemaker placement    . Cardiac catheterization    . Retinal detachment surgery    . Permanent pacemaker generator change N/A 06/12/2012    Procedure: PERMANENT PACEMAKER GENERATOR CHANGE;  Surgeon: Evans Lance, MD;  Location: [facility] CATH LAB;  Service: Cardiovascular;  Laterality: N/A;    History  Smoking status  . Never Smoker   Smokeless tobacco  . Never Used    History  Alcohol Use No    Family History  Problem Relation Age of Onset  . Heart disease Father   . Heart disease Brother   . Heart disease Brother     Review  of Systems: The review of systems is positive for chronic vision loss.  All other systems were reviewed and are negative.  Physical Exam: BP 128/68 mmHg  Pulse 68  Ht 5\' 5"  (1.651 m)  Wt 115 lb 1.6 oz (52.209 kg)  BMI 19.15 kg/m2 Patient is very pleasant and in no acute distress. Skin is warm and dry. Color is normal.  HEENT is unremarkable. Normocephalic/atraumatic. PERRL. Sclera are nonicteric. Neck is supple. No masses. No JVD. Lungs are clear. Her pacemaker site is OK. Cardiac exam shows a regular rate and rhythm. No murmur or S3. Abdomen is soft. Extremities are without edema. Gait and ROM are intact but she is a little unsteady. No gross neurologic deficits noted.   LABORATORY DATA:  Lab Results   Component Value Date   WBC 8.7 12/09/2013   HGB 14.0 12/09/2013   HCT 42.3 12/09/2013   PLT 129* 12/09/2013   GLUCOSE 93 12/09/2013   CHOL 183 06/12/2012   TRIG 103 06/12/2012   HDL 49 06/12/2012   LDLCALC 113* 06/12/2012   ALT 11 12/21/2009   AST 18 12/21/2009   NA 141 12/09/2013   K 3.8 12/09/2013   CL 103 12/09/2013   CREATININE 1.06 12/09/2013   BUN 24* 12/09/2013   CO2 26 12/09/2013   TSH 1.04 09/16/2012   INR 1.7 04/03/2014   HGBA1C 5.8* 06/12/2012     Assessment / Plan: 1. Atrial fibrillation. Last pacer check this month showed 17 mode switches lasting less than 30 seconds. <0.1%. She appears to be maintaining sinus rhythm while on Tikosyn. Status post ablation. With prior CVA she needs to remain on anticoagulation. Currently on Coumadin. Adjust dose today.She was unable to afford the novel anticoagulants. Will follow up in 4 months with BMET and CBC.  2. Chronic chest pain. Ischemic evaluation has been negative. Some chest pain related to V pacing.   3. History of CVA. On chronic coumadin.  4. Sick sinus syndrome status post pacemaker implant. Now with intermittent complete heart block. Patient has regular followup in pacemaker clinic.  5. Recurrent syncope, multifactorial. She is on chronic Florinef. Continue to liberalize salt and wear support hose. I have little else to offer.

## 2014-04-24 ENCOUNTER — Ambulatory Visit (INDEPENDENT_AMBULATORY_CARE_PROVIDER_SITE_OTHER): Payer: Medicare Other | Admitting: Pharmacist Clinician (PhC)/ Clinical Pharmacy Specialist

## 2014-04-24 DIAGNOSIS — I480 Paroxysmal atrial fibrillation: Secondary | ICD-10-CM

## 2014-04-24 DIAGNOSIS — I4891 Unspecified atrial fibrillation: Secondary | ICD-10-CM

## 2014-04-24 DIAGNOSIS — Z5181 Encounter for therapeutic drug level monitoring: Secondary | ICD-10-CM

## 2014-04-24 LAB — POCT INR: INR: 2.4

## 2014-04-24 MED ORDER — WARFARIN SODIUM 4 MG PO TABS: ORAL_TABLET | ORAL | Status: DC

## 2014-05-22 ENCOUNTER — Ambulatory Visit: Payer: Medicare Other | Admitting: Pharmacist Clinician (PhC)/ Clinical Pharmacy Specialist

## 2014-05-29 ENCOUNTER — Ambulatory Visit (INDEPENDENT_AMBULATORY_CARE_PROVIDER_SITE_OTHER): Payer: Medicare Other | Admitting: Pharmacist Clinician (PhC)/ Clinical Pharmacy Specialist

## 2014-05-29 DIAGNOSIS — I4891 Unspecified atrial fibrillation: Secondary | ICD-10-CM

## 2014-05-29 DIAGNOSIS — Z5181 Encounter for therapeutic drug level monitoring: Secondary | ICD-10-CM | POA: Diagnosis not present

## 2014-05-29 DIAGNOSIS — I480 Paroxysmal atrial fibrillation: Secondary | ICD-10-CM

## 2014-05-29 LAB — POCT INR: INR: 2

## 2014-06-26 ENCOUNTER — Ambulatory Visit (INDEPENDENT_AMBULATORY_CARE_PROVIDER_SITE_OTHER): Payer: Medicare Other | Admitting: Pharmacist Clinician (PhC)/ Clinical Pharmacy Specialist

## 2014-06-26 DIAGNOSIS — I480 Paroxysmal atrial fibrillation: Secondary | ICD-10-CM | POA: Diagnosis not present

## 2014-06-26 DIAGNOSIS — I4891 Unspecified atrial fibrillation: Secondary | ICD-10-CM | POA: Diagnosis not present

## 2014-06-26 DIAGNOSIS — Z5181 Encounter for therapeutic drug level monitoring: Secondary | ICD-10-CM | POA: Diagnosis not present

## 2014-06-26 LAB — POCT INR: INR: 2.5

## 2014-07-27 ENCOUNTER — Encounter: Payer: Self-pay | Admitting: Internal Medicine

## 2014-07-27 ENCOUNTER — Ambulatory Visit (INDEPENDENT_AMBULATORY_CARE_PROVIDER_SITE_OTHER): Payer: Medicare Other | Admitting: Internal Medicine

## 2014-07-27 VITALS — BP 148/80 | HR 60 | Ht 65.0 in | Wt 115.0 lb

## 2014-07-27 DIAGNOSIS — I495 Sick sinus syndrome: Secondary | ICD-10-CM

## 2014-07-27 DIAGNOSIS — I442 Atrioventricular block, complete: Secondary | ICD-10-CM | POA: Diagnosis not present

## 2014-07-27 DIAGNOSIS — I480 Paroxysmal atrial fibrillation: Secondary | ICD-10-CM | POA: Diagnosis not present

## 2014-07-27 DIAGNOSIS — R55 Syncope and collapse: Secondary | ICD-10-CM | POA: Diagnosis not present

## 2014-07-27 LAB — CUP PACEART INCLINIC DEVICE CHECK
Battery Impedance: 172 Ohm
Battery Remaining Longevity: 131 mo
Battery Voltage: 2.79 V
Brady Statistic AP VP Percent: 0 %
Brady Statistic AP VS Percent: 55 %
Brady Statistic AS VS Percent: 45 %
Date Time Interrogation Session: 20160523154049
Lead Channel Pacing Threshold Amplitude: 0.5 V
Lead Channel Pacing Threshold Pulse Width: 0.4 ms
Lead Channel Setting Pacing Pulse Width: 0.4 ms
Lead Channel Setting Sensing Sensitivity: 5.6 mV
MDC IDC MSMT LEADCHNL RA IMPEDANCE VALUE: 1150 Ohm
MDC IDC MSMT LEADCHNL RA PACING THRESHOLD PULSEWIDTH: 0.4 ms
MDC IDC MSMT LEADCHNL RA SENSING INTR AMPL: 2.8 mV
MDC IDC MSMT LEADCHNL RV IMPEDANCE VALUE: 705 Ohm
MDC IDC MSMT LEADCHNL RV PACING THRESHOLD AMPLITUDE: 0.5 V
MDC IDC MSMT LEADCHNL RV SENSING INTR AMPL: 15.67 mV
MDC IDC SET LEADCHNL RA PACING AMPLITUDE: 2 V
MDC IDC SET LEADCHNL RV PACING AMPLITUDE: 2.5 V
MDC IDC STAT BRADY AS VP PERCENT: 0 %

## 2014-07-27 NOTE — Patient Instructions (Signed)
Medication Instructions:  Your physician recommends that you continue on your current medications as directed. Please refer to the Current Medication list given to you today.   Labwork: None ordered  Testing/Procedures: None ordered  Follow-Up: Your physician wants you to follow-up in: 12 months with Chanetta Marshall, NP You will receive a reminder letter in the mail two months in advance. If you don't receive a letter, please call our office to schedule the follow-up appointment.   Remote monitoring is used to monitor your Pacemaker or ICD from home. This monitoring reduces the number of office visits required to check your device to one time per year. It allows [place] to keep an eye on the functioning of your device to ensure it is working properly. You are scheduled for a device check from home on 10/26/14. You may send your transmission at any time that day. If you have a wireless device, the transmission will be sent automatically. After your physician reviews your transmission, you will receive a postcard with your next transmission date.    Any Other Special Instructions Will Be Listed Below (If Applicable).

## 2014-07-27 NOTE — Progress Notes (Signed)
PCP: [name], MD Primary Cardiologist: Peter [name], MD  Katrina Rodriguez is a 81 y.o. female who presents today for routine electrophysiology followup.  She seems to be doing ok at this time.  She has occasional syncope when straining.  Today, she denies symptoms of palpitations, chest pain, shortness of breath, or  lower extremity edema.  The patient is otherwise without complaint today.   Past Medical History  Diagnosis Date  . Thrombocytopenia   . H/O: CVA (cardiovascular accident)     Right internal capsule  . Orthostasis   . Anticoagulant long-term use   . Pacemaker   . Tachycardia-bradycardia syndrome   . Mild pulmonary hypertension   . Chronic anemia   . H/O syncope   . Chest pain     Has normal coronaries per cath in 2005; negative nuclear in 2010  . Atrial fibrillation     History of ablation - Dr. Rayann Heman  . Hyperthyroidism   . Retinal detachment   . High risk medication use     Tikosyn & Coumadin  . Arthritis    Past Surgical History  Procedure Laterality Date  . Cardiac pacemaker placement    . Cardiac catheterization    . Retinal detachment surgery    . Permanent pacemaker generator change N/A 06/12/2012    Procedure: PERMANENT PACEMAKER GENERATOR CHANGE;  Surgeon: Evans Lance, MD;  Location: [facility] CATH LAB;  Service: Cardiovascular;  Laterality: N/A;    Current Outpatient Prescriptions  Medication Sig Dispense Refill  . dofetilide (TIKOSYN) 250 MCG capsule Take 1 capsule (250 mcg total) by mouth 2 (two) times daily. 180 capsule 3  . fludrocortisone (FLORINEF) 0.1 MG tablet Take 2 tablets (0.2 mg) twice a day (Patient taking differently: Take 2 tablets (0.2 mg) by mouth twice a day) 360 tablet 2  . potassium chloride SA (K-DUR,KLOR-CON) 20 MEQ tablet Take 2 tablets (40 mEq total) by mouth daily. 180 tablet 3  . warfarin (COUMADIN) 4 MG tablet Take 1 tablet by mouth daily or as directed by coumadin clinic 90 tablet 1   No current facility-administered  medications for this visit.    Physical Exam: Filed Vitals:   07/27/14 1221  BP: 148/80  Pulse: 60  Height: 5\' 5"  (1.651 m)  Weight: 52.164 kg (115 lb)    GEN- The patient is well appearing, alert and oriented x 3 today.   Head- normocephalic, atraumatic Eyes-  Sclera clear, conjunctiva pink Ears- hearing intact Oropharynx- clear Lungs- Clear to ausculation bilaterally, normal work of breathing Chest- pacemaker pocket is well healed Heart- Regular rate and rhythm, no murmurs, rubs or gallops, PMI not laterally displaced GI- soft, NT, ND, + BS Extremities- no clubbing, cyanosis, or edema  Pacemaker interrogation- reviewed in detail today,  See PACEART report ekg today reveals atrial pacing, QTc 474  Assessment and Plan:  1. presyncope/ syncope Continue florinef Oral hydration is encouraged Support hose are encouraged though she is reluctant to wear them Could consider midodrine, though I am not sure that she would tolerate this  2. Bradycardia Device function is normal Her atrial lead function appears to have recovered and is stable Device adjusted to minimize pacing.  3. HTN Stable No change required today  4. afib Continue coumadin Maintaining sinus rhythm with tikosyn Bmet, mg to be followed by Dr [name] every 6 months  [facility] Return to see EP NP in 12 months

## 2014-07-30 ENCOUNTER — Encounter: Payer: Self-pay | Admitting: Cardiology

## 2014-07-30 ENCOUNTER — Ambulatory Visit (INDEPENDENT_AMBULATORY_CARE_PROVIDER_SITE_OTHER): Payer: Medicare Other | Admitting: Pharmacist Clinician (PhC)/ Clinical Pharmacy Specialist

## 2014-07-30 ENCOUNTER — Ambulatory Visit (INDEPENDENT_AMBULATORY_CARE_PROVIDER_SITE_OTHER): Payer: Medicare Other | Admitting: Cardiology

## 2014-07-30 VITALS — BP 120/65 | HR 68 | Ht 65.0 in | Wt 113.6 lb

## 2014-07-30 DIAGNOSIS — I495 Sick sinus syndrome: Secondary | ICD-10-CM | POA: Diagnosis not present

## 2014-07-30 DIAGNOSIS — Z5181 Encounter for therapeutic drug level monitoring: Secondary | ICD-10-CM

## 2014-07-30 DIAGNOSIS — I480 Paroxysmal atrial fibrillation: Secondary | ICD-10-CM

## 2014-07-30 DIAGNOSIS — I4891 Unspecified atrial fibrillation: Secondary | ICD-10-CM | POA: Diagnosis not present

## 2014-07-30 DIAGNOSIS — I951 Orthostatic hypotension: Secondary | ICD-10-CM | POA: Diagnosis not present

## 2014-07-30 DIAGNOSIS — E785 Hyperlipidemia, unspecified: Secondary | ICD-10-CM | POA: Diagnosis not present

## 2014-07-30 DIAGNOSIS — R55 Syncope and collapse: Secondary | ICD-10-CM

## 2014-07-30 LAB — HEPATIC FUNCTION PANEL
ALK PHOS: 66 U/L (ref 39–117)
ALT: 8 U/L (ref 0–35)
AST: 19 U/L (ref 0–37)
Albumin: 4.2 g/dL (ref 3.5–5.2)
BILIRUBIN DIRECT: 0.1 mg/dL (ref 0.0–0.3)
BILIRUBIN TOTAL: 0.5 mg/dL (ref 0.2–1.2)
Indirect Bilirubin: 0.4 mg/dL (ref 0.2–1.2)
TOTAL PROTEIN: 6.7 g/dL (ref 6.0–8.3)

## 2014-07-30 LAB — CBC WITH DIFFERENTIAL/PLATELET
BASOS PCT: 0 % (ref 0–1)
Basophils Absolute: 0 10*3/uL (ref 0.0–0.1)
Eosinophils Absolute: 0 10*3/uL (ref 0.0–0.7)
Eosinophils Relative: 1 % (ref 0–5)
HCT: 42.9 % (ref 36.0–46.0)
Hemoglobin: 14.5 g/dL (ref 12.0–15.0)
Lymphocytes Relative: 18 % (ref 12–46)
Lymphs Abs: 0.8 10*3/uL (ref 0.7–4.0)
MCH: 31.5 pg (ref 26.0–34.0)
MCHC: 33.8 g/dL (ref 30.0–36.0)
MCV: 93.1 fL (ref 78.0–100.0)
MONO ABS: 0.6 10*3/uL (ref 0.1–1.0)
MPV: 11.1 fL (ref 8.6–12.4)
Monocytes Relative: 13 % — ABNORMAL HIGH (ref 3–12)
NEUTROS PCT: 68 % (ref 43–77)
Neutro Abs: 2.9 10*3/uL (ref 1.7–7.7)
PLATELETS: 134 10*3/uL — AB (ref 150–400)
RBC: 4.61 MIL/uL (ref 3.87–5.11)
RDW: 13.4 % (ref 11.5–15.5)
WBC: 4.3 10*3/uL (ref 4.0–10.5)

## 2014-07-30 LAB — BASIC METABOLIC PANEL
BUN: 19 mg/dL (ref 6–23)
CO2: 25 meq/L (ref 19–32)
CREATININE: 1.07 mg/dL (ref 0.50–1.10)
Calcium: 8.8 mg/dL (ref 8.4–10.5)
Chloride: 106 mEq/L (ref 96–112)
Glucose, Bld: 90 mg/dL (ref 70–99)
Potassium: 4 mEq/L (ref 3.5–5.3)
Sodium: 141 mEq/L (ref 135–145)

## 2014-07-30 LAB — LIPID PANEL
Cholesterol: 217 mg/dL — ABNORMAL HIGH (ref 0–200)
HDL: 53 mg/dL (ref >=46)
LDL CALC: 136 mg/dL — AB (ref 0–99)
TRIGLYCERIDES: 142 mg/dL (ref <150)
Total CHOL/HDL Ratio: 4.1 Ratio
VLDL: 28 mg/dL (ref 0–40)

## 2014-07-30 LAB — POCT INR: INR: 3.3

## 2014-07-30 NOTE — Patient Instructions (Addendum)
We will check lab work today  I will see you in 4 months.

## 2014-07-30 NOTE — Progress Notes (Signed)
[name] Date of Birth: 02-08-1929 Medical Record #[id number]  History of Present Illness: Katrina Rodriguez is seen today for follow up of syncope. She has a history of atrial fibrillation. She is on chronic Tikosyn therapy. She has had a previous atrial fibrillation ablation. She has a history of recurrent syncope. She was noted to have intermittent complete heart block. Her pacemaker was reprogrammed to DDD with AV search to minimize V pacing.  She has a history of chronic chest pain with normal coronary anatomy by cardiac catheterization. She had a normal nuclear stress test 2/13. She has known sensitivity with V pacing with chest pain.   She reports that she is still having fairly frequent episodes of blacking out/presyncope. She gets a "toothache" in her chest that goes into her jaw and then she goes out. This is worse with any strain such as bending over. She does eat a lot of salt. Recent pacemaker check showed maintenance of NSR.   Current Outpatient Prescriptions on File Prior to Visit  Medication Sig Dispense Refill  . dofetilide (TIKOSYN) 250 MCG capsule Take 1 capsule (250 mcg total) by mouth 2 (two) times daily. 180 capsule 3  . fludrocortisone (FLORINEF) 0.1 MG tablet Take 2 tablets (0.2 mg) twice a day (Patient taking differently: Take 2 tablets (0.2 mg) by mouth twice a day) 360 tablet 2  . potassium chloride SA (K-DUR,KLOR-CON) 20 MEQ tablet Take 2 tablets (40 mEq total) by mouth daily. 180 tablet 3  . warfarin (COUMADIN) 4 MG tablet Take 1 tablet by mouth daily or as directed by coumadin clinic 90 tablet 1   No current facility-administered medications on file prior to visit.    Allergies  Allergen Reactions  . Amiodarone Other (See Comments)    Caused thyroid problems  . Amoxicillin Hives  . Aspirin Other (See Comments)    On tikosyn and warfarin  . Codeine Other (See Comments)    Causes BP to drop  . Darvocet [Propoxyphene N-Acetaminophen] Hives  . Demerol Itching  .  Penicillins Hives    Past Medical History  Diagnosis Date  . Thrombocytopenia   . H/O: CVA (cardiovascular accident)     Right internal capsule  . Orthostasis   . Anticoagulant long-term use   . Pacemaker   . Tachycardia-bradycardia syndrome   . Mild pulmonary hypertension   . Chronic anemia   . H/O syncope   . Chest pain     Has normal coronaries per cath in 2005; negative nuclear in 2010  . Atrial fibrillation     History of ablation - Dr. Rayann Heman  . Hyperthyroidism   . Retinal detachment   . High risk medication use     Tikosyn & Coumadin  . Arthritis     Past Surgical History  Procedure Laterality Date  . Cardiac pacemaker placement    . Cardiac catheterization    . Retinal detachment surgery    . Permanent pacemaker generator change N/A 06/12/2012    Procedure: PERMANENT PACEMAKER GENERATOR CHANGE;  Surgeon: Evans Lance, MD;  Location: [facility] CATH LAB;  Service: Cardiovascular;  Laterality: N/A;    History  Smoking status  . Never Smoker   Smokeless tobacco  . Never Used    History  Alcohol Use No    Family History  Problem Relation Age of Onset  . Heart disease Father   . Heart disease Brother   . Heart disease Brother     Review of Systems: The review of  systems is positive for chronic vision loss.  All other systems were reviewed and are negative.  Physical Exam: BP 120/65 mmHg  Pulse 68  Ht 5\' 5"  (1.651 m)  Wt 51.529 kg (113 lb 9.6 oz)  BMI 18.90 kg/m2 Patient is very pleasant and in no acute distress. Skin is warm and dry. Color is normal.  HEENT is unremarkable. Normocephalic/atraumatic. PERRL. Sclera are nonicteric. Neck is supple. No masses. No JVD. Lungs are clear. Her pacemaker site is OK. Cardiac exam shows a regular rate and rhythm. No murmur or S3. Abdomen is soft. Extremities are without edema. Gait and ROM are intact but she is a little unsteady. No gross neurologic deficits noted.   LABORATORY DATA:  Lab Results  Component Value Date    WBC 8.7 12/09/2013   HGB 14.0 12/09/2013   HCT 42.3 12/09/2013   PLT 129* 12/09/2013   GLUCOSE 93 12/09/2013   CHOL 183 06/12/2012   TRIG 103 06/12/2012   HDL 49 06/12/2012   LDLCALC 113* 06/12/2012   ALT 11 12/21/2009   AST 18 12/21/2009   NA 141 12/09/2013   K 3.8 12/09/2013   CL 103 12/09/2013   CREATININE 1.06 12/09/2013   BUN 24* 12/09/2013   CO2 26 12/09/2013   TSH 1.04 09/16/2012   INR 3.3 07/30/2014   HGBA1C 5.8* 06/12/2012     Assessment / Plan: 1. Atrial fibrillation. Last pacer check this month showed maintenance of NSR. She appears to be maintaining sinus rhythm while on Tikosyn. Status post ablation. With prior CVA she needs to remain on anticoagulation. Currently on Coumadin. Adjust dose today.She was unable to afford the novel anticoagulants. Will follow up in 4 months.  2. Chronic chest pain. Ischemic evaluation has been negative. Some chest pain related to V pacing.   3. History of CVA. On chronic coumadin.  4. Sick sinus syndrome status post pacemaker implant. Now with intermittent complete heart block. Patient has regular followup in pacemaker clinic.  5. Recurrent syncope, multifactorial. She is on chronic Florinef. Continue to liberalize salt and wear support hose. I have little else to offer.   Will check CBC, CMET, and lipids today.

## 2014-07-31 ENCOUNTER — Telehealth: Payer: Self-pay | Admitting: Cardiology

## 2014-07-31 MED ORDER — POTASSIUM CHLORIDE CRYS ER 20 MEQ PO TBCR: 40.0000 meq | EXTENDED_RELEASE_TABLET | Freq: Every day | ORAL | Status: DC

## 2014-07-31 NOTE — Telephone Encounter (Signed)
Returned call to patient's daughter Katrina Rodriguez she stated mother not feeling well this morning.Stated she has no appetite and feels like she is getting a cold.Advised she needs to see PCP.

## 2014-07-31 NOTE — Telephone Encounter (Signed)
Please call,have some concerns about her mother condition. She is getting a cold,nausated and not eating at all.

## 2014-08-17 ENCOUNTER — Ambulatory Visit (INDEPENDENT_AMBULATORY_CARE_PROVIDER_SITE_OTHER): Payer: Medicare Other | Admitting: Pharmacist Clinician (PhC)/ Clinical Pharmacy Specialist

## 2014-08-17 DIAGNOSIS — I4891 Unspecified atrial fibrillation: Secondary | ICD-10-CM | POA: Diagnosis not present

## 2014-08-17 DIAGNOSIS — Z5181 Encounter for therapeutic drug level monitoring: Secondary | ICD-10-CM | POA: Diagnosis not present

## 2014-08-17 DIAGNOSIS — I480 Paroxysmal atrial fibrillation: Secondary | ICD-10-CM

## 2014-08-17 LAB — POCT INR: INR: 2.2

## 2014-08-27 ENCOUNTER — Telehealth: Payer: Self-pay | Admitting: Cardiology

## 2014-08-27 ENCOUNTER — Telehealth: Payer: Self-pay

## 2014-08-27 MED ORDER — FLUDROCORTISONE ACETATE 0.1 MG PO TABS: ORAL_TABLET | ORAL | Status: DC

## 2014-08-27 NOTE — Telephone Encounter (Signed)
Florinef refill sent to pharmacy

## 2014-08-27 NOTE — Telephone Encounter (Signed)
°  1. Which medications need to be refilled? Fludrocort  2. Which pharmacy is medication to be sent to?[facility]  3. Do they need a 30 day or 90 day supply? 90 and refills  4. Would they like a call back once the medication has been sent to the pharmacy? no

## 2014-08-30 NOTE — Telephone Encounter (Signed)
We will refill Katrina Rodriguez  [name] MD, [facility]

## 2014-09-04 ENCOUNTER — Telehealth: Payer: Self-pay | Admitting: Cardiology

## 2014-09-04 NOTE — Telephone Encounter (Signed)
Tikosyn refill already sent to pharmacy.

## 2014-09-04 NOTE — Telephone Encounter (Signed)
°  1. Which medications need to be refilled? Tykosyn  2. Which pharmacy is medication to be sent to? [facility]  3. Do they need a 30 day or 90 day supply? 90  4. Would they like a call back once the medication has been sent to the pharmacy? No

## 2014-09-14 ENCOUNTER — Ambulatory Visit (INDEPENDENT_AMBULATORY_CARE_PROVIDER_SITE_OTHER): Payer: Medicare Other | Admitting: Pharmacist Clinician (PhC)/ Clinical Pharmacy Specialist

## 2014-09-14 ENCOUNTER — Ambulatory Visit: Payer: Medicare Other | Admitting: Pharmacist Clinician (PhC)/ Clinical Pharmacy Specialist

## 2014-09-14 DIAGNOSIS — Z5181 Encounter for therapeutic drug level monitoring: Secondary | ICD-10-CM | POA: Diagnosis not present

## 2014-09-14 DIAGNOSIS — I480 Paroxysmal atrial fibrillation: Secondary | ICD-10-CM | POA: Diagnosis not present

## 2014-09-14 DIAGNOSIS — I4891 Unspecified atrial fibrillation: Secondary | ICD-10-CM

## 2014-09-14 LAB — POCT INR: INR: 3.6

## 2014-09-15 ENCOUNTER — Other Ambulatory Visit: Payer: Self-pay | Admitting: Cardiology

## 2014-09-15 MED ORDER — DOFETILIDE 250 MCG PO CAPS: 250.0000 ug | ORAL_CAPSULE | Freq: Two times a day (BID) | ORAL | Status: DC

## 2014-09-15 NOTE — Telephone Encounter (Signed)
E-sent to pharmacy Patient aware. She states that [facility] lost her original prescription

## 2014-09-15 NOTE — Telephone Encounter (Signed)
°  1. Which medications need to be refilled?Tioskyn  2. Which pharmacy is medication to be sent to? [facility] on [address]  3. Do they need a 30 day or 90 day supply? 90  4. Would they like a call back once the medication has been sent to the pharmacy? Yes

## 2014-10-05 ENCOUNTER — Ambulatory Visit (INDEPENDENT_AMBULATORY_CARE_PROVIDER_SITE_OTHER): Payer: Medicare Other | Admitting: Pharmacist Clinician (PhC)/ Clinical Pharmacy Specialist

## 2014-10-05 DIAGNOSIS — Z5181 Encounter for therapeutic drug level monitoring: Secondary | ICD-10-CM

## 2014-10-05 DIAGNOSIS — I4891 Unspecified atrial fibrillation: Secondary | ICD-10-CM

## 2014-10-05 DIAGNOSIS — I480 Paroxysmal atrial fibrillation: Secondary | ICD-10-CM

## 2014-10-05 LAB — POCT INR: INR: 2.7

## 2014-10-26 ENCOUNTER — Encounter: Payer: Medicare Other | Admitting: *Deleted

## 2014-10-26 ENCOUNTER — Telehealth: Payer: Self-pay | Admitting: Cardiology

## 2014-10-26 NOTE — Telephone Encounter (Signed)
Spoke with pt and reminded pt of remote transmission that is due today. Pt verbalized understanding.   

## 2014-10-27 ENCOUNTER — Encounter: Payer: Self-pay | Admitting: Cardiology

## 2014-11-02 ENCOUNTER — Encounter: Payer: Self-pay | Admitting: Internal Medicine

## 2014-11-02 ENCOUNTER — Telehealth: Payer: Self-pay | Admitting: Internal Medicine

## 2014-11-02 ENCOUNTER — Ambulatory Visit (INDEPENDENT_AMBULATORY_CARE_PROVIDER_SITE_OTHER): Payer: Medicare Other | Admitting: Pharmacist Clinician (PhC)/ Clinical Pharmacy Specialist

## 2014-11-02 ENCOUNTER — Ambulatory Visit (INDEPENDENT_AMBULATORY_CARE_PROVIDER_SITE_OTHER): Payer: Medicare Other | Admitting: *Deleted

## 2014-11-02 DIAGNOSIS — Z950 Presence of cardiac pacemaker: Secondary | ICD-10-CM

## 2014-11-02 DIAGNOSIS — I480 Paroxysmal atrial fibrillation: Secondary | ICD-10-CM

## 2014-11-02 DIAGNOSIS — I495 Sick sinus syndrome: Secondary | ICD-10-CM

## 2014-11-02 DIAGNOSIS — I4891 Unspecified atrial fibrillation: Secondary | ICD-10-CM | POA: Diagnosis not present

## 2014-11-02 DIAGNOSIS — Z5181 Encounter for therapeutic drug level monitoring: Secondary | ICD-10-CM | POA: Diagnosis not present

## 2014-11-02 LAB — POCT INR: INR: 2.7

## 2014-11-02 NOTE — Progress Notes (Signed)
PPM remote received.  

## 2014-11-02 NOTE — Telephone Encounter (Signed)
New message      Calling to see if we received her transmission.  Pt will be home until 8:30

## 2014-11-02 NOTE — Telephone Encounter (Signed)
Pt called to verify remote received. Remote not received inititally. I walked pt through manual transmission. Remote was received. Pt aware for future remotes to leave [employer] plugged in for a few hours to transmit instead of unplugging immediately.

## 2014-11-04 LAB — CUP PACEART REMOTE DEVICE CHECK
Battery Impedance: 196 Ohm
Battery Voltage: 2.79 V
Brady Statistic AP VP Percent: 0 %
Brady Statistic AP VS Percent: 57 %
Brady Statistic AS VP Percent: 0 %
Brady Statistic AS VS Percent: 43 %
Lead Channel Impedance Value: 1121 Ohm
Lead Channel Impedance Value: 692 Ohm
Lead Channel Pacing Threshold Amplitude: 0.375 V
Lead Channel Pacing Threshold Pulse Width: 0.4 ms
Lead Channel Sensing Intrinsic Amplitude: 11.2 mV
Lead Channel Setting Pacing Amplitude: 2 V
Lead Channel Setting Pacing Pulse Width: 0.4 ms
Lead Channel Setting Sensing Sensitivity: 5.6 mV
MDC IDC MSMT BATTERY REMAINING LONGEVITY: 126 mo
MDC IDC SESS DTM: 20160829121906
MDC IDC SET LEADCHNL RV PACING AMPLITUDE: 2.5 V

## 2014-11-06 ENCOUNTER — Encounter: Payer: Self-pay | Admitting: Cardiology

## 2014-11-30 ENCOUNTER — Ambulatory Visit (INDEPENDENT_AMBULATORY_CARE_PROVIDER_SITE_OTHER): Payer: Medicare Other | Admitting: Pharmacist Clinician (PhC)/ Clinical Pharmacy Specialist

## 2014-11-30 DIAGNOSIS — I4891 Unspecified atrial fibrillation: Secondary | ICD-10-CM | POA: Diagnosis not present

## 2014-11-30 DIAGNOSIS — I480 Paroxysmal atrial fibrillation: Secondary | ICD-10-CM | POA: Diagnosis not present

## 2014-11-30 DIAGNOSIS — Z5181 Encounter for therapeutic drug level monitoring: Secondary | ICD-10-CM | POA: Diagnosis not present

## 2014-11-30 LAB — POCT INR: INR: 2.5

## 2014-12-28 ENCOUNTER — Ambulatory Visit (INDEPENDENT_AMBULATORY_CARE_PROVIDER_SITE_OTHER): Payer: Medicare Other | Admitting: Pharmacist Clinician (PhC)/ Clinical Pharmacy Specialist

## 2014-12-28 DIAGNOSIS — I480 Paroxysmal atrial fibrillation: Secondary | ICD-10-CM | POA: Diagnosis not present

## 2014-12-28 DIAGNOSIS — Z5181 Encounter for therapeutic drug level monitoring: Secondary | ICD-10-CM

## 2014-12-28 DIAGNOSIS — I4891 Unspecified atrial fibrillation: Secondary | ICD-10-CM | POA: Diagnosis not present

## 2014-12-28 LAB — POCT INR: INR: 2.1

## 2014-12-28 MED ORDER — WARFARIN SODIUM 4 MG PO TABS: ORAL_TABLET | ORAL | Status: DC

## 2015-01-04 DIAGNOSIS — D3131 Benign neoplasm of right choroid: Secondary | ICD-10-CM | POA: Diagnosis not present

## 2015-01-04 DIAGNOSIS — H4720 Unspecified optic atrophy: Secondary | ICD-10-CM | POA: Diagnosis not present

## 2015-01-04 DIAGNOSIS — H1851 Endothelial corneal dystrophy: Secondary | ICD-10-CM | POA: Diagnosis not present

## 2015-01-04 DIAGNOSIS — Z961 Presence of intraocular lens: Secondary | ICD-10-CM | POA: Diagnosis not present

## 2015-01-18 ENCOUNTER — Encounter: Payer: Self-pay | Admitting: Neurology

## 2015-01-18 ENCOUNTER — Encounter: Payer: Self-pay | Admitting: *Deleted

## 2015-01-18 ENCOUNTER — Ambulatory Visit (INDEPENDENT_AMBULATORY_CARE_PROVIDER_SITE_OTHER): Payer: Medicare Other | Admitting: Neurology

## 2015-01-18 VITALS — BP 141/67 | HR 62 | Ht 65.0 in | Wt 115.5 lb

## 2015-01-18 DIAGNOSIS — H4720 Unspecified optic atrophy: Secondary | ICD-10-CM | POA: Diagnosis not present

## 2015-01-18 DIAGNOSIS — E538 Deficiency of other specified B group vitamins: Secondary | ICD-10-CM

## 2015-01-18 HISTORY — DX: Unspecified optic atrophy: H47.20

## 2015-01-18 NOTE — Patient Instructions (Signed)
   We will check a carotid doppler, CT of the head and blood work. I will call for the results.

## 2015-01-18 NOTE — Progress Notes (Signed)
Reason for visit: Optic atrophy  Referring physician: Dr. [name] Schaper is a 81 y.o. female  History of present illness:  Ms. Course is an 81 year old right-handed white female with a history of retinal detachments on the left side, and subsequent visual loss on the left. The patient has had some progression of visual loss that began on the right side about 3 years ago, but it has gradually worsened. The patient was found to have optic atrophy that has developed over time, and she is sent to this office for an evaluation. The patient does not operate a motor vehicle secondary to the visual complaints. She denies any headaches, she does have a history of cerebrovascular disease with a history of atrial fibrillation, and a history of complete heart block. The patient has a pacemaker in place. The patient has sustained a left hemiparesis and left sensory deficit following a stroke in 2011. The patient has a gait disturbance, she uses a cane for ambulation. She has not had any recent falls. She is on Coumadin therapy. The patient denies any new numbness or weakness on the face, arms, or legs. She is concerned about her visual alterations.  Past Medical History  Diagnosis Date  . Thrombocytopenia ([place])   . H/O: CVA (cardiovascular accident)     Right internal capsule  . Orthostasis   . Anticoagulant long-term use   . Pacemaker   . Tachycardia-bradycardia syndrome ([place])   . Mild pulmonary hypertension ([place])   . Chronic anemia   . H/O syncope   . Chest pain     Has normal coronaries per cath in 2005; negative nuclear in 2010  . Atrial fibrillation ([facility])     History of ablation - Dr. Rayann Heman  . Hyperthyroidism   . Retinal detachment   . High risk medication use     Tikosyn & Coumadin  . Arthritis   . 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 Cardiac pacemaker placement    . Cardiac catheterization    . Retinal detachment surgery     . Permanent pacemaker generator change N/A 06/12/2012    Procedure: PERMANENT PACEMAKER GENERATOR CHANGE;  Surgeon: Evans Lance, MD;  Location: [facility] CATH LAB;  Service: Cardiovascular;  Laterality: N/A;    Family History  Problem Relation Age of Onset  . Heart disease Father   . Heart disease Brother   . Heart disease Brother   . Hypertension Mother   . Stroke Father   . Diabetes Sister     Social history:  reports that she has never smoked. She has never used smokeless tobacco. She reports that she does not drink alcohol or use illicit drugs.  Medications:  Prior to Admission medications   Medication Sig Start Date End Date Taking? Authorizing Provider  dofetilide (TIKOSYN) 250 MCG capsule Take 1 capsule (250 mcg total) by mouth 2 (two) times daily. 09/15/14  Yes Peter M [name], MD  fludrocortisone (FLORINEF) 0.1 MG tablet Take 2 tablets (0.2 mg) twice a day 08/27/14  Yes Peter M [name], MD  potassium chloride SA (K-DUR,KLOR-CON) 20 MEQ tablet Take 2 tablets (40 mEq total) by mouth daily. 07/31/14  Yes Peter M [name], MD  warfarin (COUMADIN) 4 MG tablet Take 1 tablet by mouth  daily or as directed by  coumadin clinic 12/28/14  Yes Peter M [name], MD      Allergies  Allergen Reactions  . Amiodarone  Other (See Comments)    Caused thyroid problems  . Amoxicillin Hives  . Aspirin Other (See Comments)    On tikosyn and warfarin  . Codeine Other (See Comments)    Causes BP to drop  . Darvocet [Propoxyphene N-Acetaminophen] Hives  . Demerol Itching  . Penicillins Hives    ROS:  Out of a complete 14 system review of symptoms, the patient complains only of the following symptoms, and all other reviewed systems are negative.  Visual loss Gait disturbance  Blood pressure 141/67, pulse 62, height 5\' 5"  (1.651 m), weight 115 lb 8 oz (52.39 kg).  Physical Exam  General: The patient is alert and cooperative at the time of the examination.  Eyes: Pupils are equal, round, and  reactive to light. Discs are flat bilaterally, optic atrophy is seen bilaterally. There is an afferent pupillary defect on the left.    Neck: The neck is supple, no carotid bruits are noted.  Respiratory: The respiratory examination is clear.  Cardiovascular: The cardiovascular examination reveals a regular rate and rhythm, no obvious murmurs or rubs are noted.  Skin: Extremities are without significant edema.  Neurologic Exam  Mental status: The patient is alert and oriented x 3 at the time of the examination. The patient has apparent normal recent and remote memory, with an apparently normal attention span and concentration ability.  Cranial nerves: Facial symmetry is present. There is good sensation of the face to pinprick and soft touch bilaterally. The strength of the facial muscles and the muscles to head turning and shoulder shrug are normal bilaterally. Speech is well enunciated, no aphasia or dysarthria is noted. Extraocular movements are full. Visual fields are full on the right eye, but visual acuity is significant impaired. The patient is able to see light only on the left eye. The tongue is midline, and the patient has symmetric elevation of the soft palate. No obvious hearing deficits are noted.  Motor: The motor testing reveals 5 over 5 strength of all 4 extremities. Good symmetric motor tone is noted throughout.  Sensory: Sensory testing is intact to pinprick, soft touch, vibration sensation, and position sense on the right extremities, decreased on the left.  There is extension on the left arm and leg.   Coordination: Cerebellar testing reveals good finger-nose-finger and heel-to-shin bilaterally.  Gait and station: Gait is slightly wide-based. Gait is unsteady. Tandem gait was not attempted. Romberg is unsteady, the patient does not fall. The patient normally walks with a cane.   Reflexes: Deep tendon reflexes are relatively symmetric with exception of some 2 or 3 beats of  ankle clonus on the left. Toes are downgoing bilaterally.   Assessment/Plan:   1. Cerebrovascular disease, left-sided weakness and numbness  2. Progressive visual loss, OD  The patient will be set up for further evaluation at this time. We will recheck a CT scan of the brain, she cannot have MRI. We will get blood work done today, and we will set the patient up for a carotid Doppler study. Upon review of the prior brain CT scan evaluation approximately one year ago, I question whether the patient had a prior right medullary stroke. The patient will follow-up in 3 to 4 months.  Jill Alexanders MD 01/18/2015 7:27 PM  [facility] [address] [place], [place] [address]  Phone [phone] Fax [phone]

## 2015-01-19 ENCOUNTER — Telehealth: Payer: Self-pay | Admitting: Neurology

## 2015-01-19 ENCOUNTER — Other Ambulatory Visit: Payer: Self-pay | Admitting: Neurology

## 2015-01-19 DIAGNOSIS — H547 Unspecified visual loss: Secondary | ICD-10-CM

## 2015-01-19 DIAGNOSIS — R55 Syncope and collapse: Secondary | ICD-10-CM

## 2015-01-19 LAB — SEDIMENTATION RATE: SED RATE: 2 mm/h (ref 0–40)

## 2015-01-19 LAB — ANGIOTENSIN CONVERTING ENZYME: ANGIO CONVERT ENZYME: 55 U/L (ref 14–82)

## 2015-01-19 LAB — RPR: RPR Ser Ql: NONREACTIVE

## 2015-01-19 LAB — ANA W/REFLEX: ANA: NEGATIVE

## 2015-01-19 LAB — VITAMIN B12: VITAMIN B 12: 195 pg/mL — AB (ref 211–946)

## 2015-01-19 NOTE — Telephone Encounter (Signed)
I scheduled patient's carotid bilateral for Monday 11/28 @ [facility]. Patient is aware of appointment. [facility] did request for the order to be changed to [id number] instead of what it shows now so The order can be linked to the appointment. Thank you!

## 2015-01-19 NOTE — Telephone Encounter (Signed)
I called patient. The blood work was unremarkable with exception that the vitamin B12 level was low. I will have the patient come in for a B12 injection, and then start her on vitamin B12 tablets taking 1000 g daily.

## 2015-01-20 ENCOUNTER — Telehealth: Payer: Self-pay | Admitting: Cardiology

## 2015-01-20 NOTE — Telephone Encounter (Signed)
I called the patient. She asked if she could get the Vitamin B12 shot at her cardiologist's office [facility]. [phone] as she has an appointment Tuesday to see him. She states she has trouble getting a ride to appointments and it would help if she could have it all done at one visit. I advised I would call their office and ask if they give B12 shots. I spoke to [place] at [facility]. She advised she would have Dr. Doug Sou nurse, Malachy Mood, call me back.

## 2015-01-20 NOTE — Telephone Encounter (Signed)
[place](RN) called in stating that Dr. [name] wanted the pt to have a B12 shot and the pt informed that doctor that she would be having an appt with Dr. [name] on 11/22 and she wanted to know if she could have that done(if Dr. [name] would do it) on that same day. Please contact Charisse Klinefelter to inform her of what Dr. [name] thinks.  Thanks

## 2015-01-20 NOTE — Telephone Encounter (Signed)
I called the patient and explained that Dr. Doug Sou does not give B12 shots. Appointment scheduled 11/22 for B12 injection in our office.

## 2015-01-20 NOTE — Telephone Encounter (Signed)
Returned call to [place] with [facility].Advised we do not give B12 shots.

## 2015-01-20 NOTE — Telephone Encounter (Signed)
Dr. Doug Sou office called back and states they do not give B12 shot in their office. [phone]

## 2015-01-26 ENCOUNTER — Ambulatory Visit (INDEPENDENT_AMBULATORY_CARE_PROVIDER_SITE_OTHER): Payer: Medicare Other

## 2015-01-26 ENCOUNTER — Ambulatory Visit (INDEPENDENT_AMBULATORY_CARE_PROVIDER_SITE_OTHER): Payer: Medicare Other | Admitting: Pharmacist Clinician (PhC)/ Clinical Pharmacy Specialist

## 2015-01-26 ENCOUNTER — Ambulatory Visit (INDEPENDENT_AMBULATORY_CARE_PROVIDER_SITE_OTHER): Payer: Medicare Other | Admitting: Cardiology

## 2015-01-26 ENCOUNTER — Encounter: Payer: Self-pay | Admitting: Cardiology

## 2015-01-26 VITALS — BP 144/72 | HR 76 | Resp 18 | Ht 65.0 in | Wt 116.0 lb

## 2015-01-26 VITALS — BP 140/68 | HR 62 | Ht 65.0 in | Wt 115.9 lb

## 2015-01-26 DIAGNOSIS — I4891 Unspecified atrial fibrillation: Secondary | ICD-10-CM

## 2015-01-26 DIAGNOSIS — Z5181 Encounter for therapeutic drug level monitoring: Secondary | ICD-10-CM

## 2015-01-26 DIAGNOSIS — I480 Paroxysmal atrial fibrillation: Secondary | ICD-10-CM | POA: Diagnosis not present

## 2015-01-26 DIAGNOSIS — R55 Syncope and collapse: Secondary | ICD-10-CM | POA: Diagnosis not present

## 2015-01-26 DIAGNOSIS — I442 Atrioventricular block, complete: Secondary | ICD-10-CM

## 2015-01-26 DIAGNOSIS — E538 Deficiency of other specified B group vitamins: Secondary | ICD-10-CM | POA: Diagnosis not present

## 2015-01-26 DIAGNOSIS — I495 Sick sinus syndrome: Secondary | ICD-10-CM

## 2015-01-26 LAB — POCT INR: INR: 1.9

## 2015-01-26 MED ORDER — CYANOCOBALAMIN 1000 MCG/ML IJ SOLN
1000.0000 ug | Freq: Once | INTRAMUSCULAR | Status: AC
  Administered 2015-01-26: 1000 ug via INTRAMUSCULAR

## 2015-01-26 NOTE — Patient Instructions (Signed)
Continue your current therapy  I will see you in 6 months.   

## 2015-01-26 NOTE — Progress Notes (Signed)
Katrina Rodriguez Date of Birth: 28-Jun-1928 Medical Record [id number]  History of Present Illness: Katrina Rodriguez is seen today for follow up of syncope. She has a history of atrial fibrillation. She is on chronic Tikosyn therapy. She has had a previous atrial fibrillation ablation. She has a history of recurrent syncope. She was noted to have intermittent complete heart block. Her pacemaker was reprogrammed to DDD with AV search to minimize V pacing.  She has a history of chronic chest pain with normal coronary anatomy by cardiac catheterization. She had a normal nuclear stress test 2/13. She has known sensitivity with V pacing with chest pain.    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 She is seeing Dr. [name] and is scheduled for cranial CT and carotid dopplers. Mildly B12 deficient.  She still has dizziness worse in the mornings after eating. States chest feels "tired" at these times. She does eat a lot of salt. Recent pacemaker check showed maintenance of NSR.   Current Outpatient Prescriptions on File Prior to Visit  Medication Sig Dispense Refill  . dofetilide (TIKOSYN) 250 MCG capsule Take 1 capsule (250 mcg total) by mouth 2 (two) times daily. 180 capsule 3  . fludrocortisone (FLORINEF) 0.1 MG tablet Take 2 tablets (0.2 mg) twice a day 360 tablet 3  . potassium chloride SA (K-DUR,KLOR-CON) 20 MEQ tablet Take 2 tablets (40 mEq total) by mouth daily. 180 tablet 3  . vitamin B-12 (CYANOCOBALAMIN) 1000 MCG tablet Take 1,000 mcg by mouth daily.    Marland Kitchen warfarin (COUMADIN) 4 MG tablet Take 1 tablet by mouth  daily or as directed by  coumadin clinic 90 tablet 1   No current facility-administered medications on file prior to visit.    Allergies  Allergen Reactions  . Amiodarone Other (See Comments)    Caused thyroid problems  . Amoxicillin Hives  . Aspirin Other (See Comments)    On tikosyn and warfarin  . Codeine Other (See Comments)    Causes BP to drop  .  Darvocet [Propoxyphene N-Acetaminophen] Hives  . Demerol Itching  . Penicillins Hives    Past Medical History  Diagnosis Date  . Thrombocytopenia ([place])   . H/O: CVA (cardiovascular accident)     Right internal capsule  . Orthostasis   . Anticoagulant long-term use   . Pacemaker   . Tachycardia-bradycardia syndrome ([place])   . Mild pulmonary hypertension ([place])   . Chronic anemia   . H/O syncope   . Chest pain     Has normal coronaries per cath in 2005; negative nuclear in 2010  . Atrial fibrillation (H[facility])     History of ablation - Dr. Rayann Heman  . Hyperthyroidism   . Retinal detachment   . High risk medication use     Tikosyn & Coumadin  . Arthritis   . 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 Cardiac pacemaker placement    . Cardiac catheterization    . Retinal detachment surgery    . Permanent pacemaker generator change N/A 06/12/2012    Procedure: PERMANENT PACEMAKER GENERATOR CHANGE;  Surgeon: Evans Lance, MD;  Location: [facility] CATH LAB;  Service: Cardiovascular;  Laterality: N/A;    History  Smoking status  . Never Smoker   Smokeless tobacco  . Never Used    History  Alcohol Use No    Family History  Problem Relation Age  of Onset  . Heart disease Father   . Heart disease Brother   . Heart disease Brother   . Hypertension Mother   . Stroke Father   . Diabetes Sister     Review of Systems: The review of systems is positive for  vision loss.  All other systems were reviewed and are negative.  Physical Exam: BP 140/68 mmHg  Pulse 62  Ht 5\' 5"  (1.651 m)  Wt 52.572 kg (115 lb 14.4 oz)  BMI 19.29 kg/m2 Patient is very pleasant and in no acute distress. Skin is warm and dry. Color is normal.  HEENT is unremarkable. Normocephalic/atraumatic. PERRL. Sclera are nonicteric. Neck is supple. No masses. No JVD. Lungs are clear. Her pacemaker site is OK. Cardiac exam shows a regular rate and rhythm. No murmur or S3.  Abdomen is soft. Extremities are without edema. Gait and ROM are intact but she is a little unsteady. No gross neurologic deficits noted.   LABORATORY DATA:  Lab Results  Component Value Date   WBC 4.3 07/30/2014   HGB 14.5 07/30/2014   HCT 42.9 07/30/2014   PLT 134* 07/30/2014   GLUCOSE 90 07/30/2014   CHOL 217* 07/30/2014   TRIG 142 07/30/2014   HDL 53 07/30/2014   LDLCALC 136* 07/30/2014   ALT 8 07/30/2014   AST 19 07/30/2014   NA 141 07/30/2014   K 4.0 07/30/2014   CL 106 07/30/2014   CREATININE 1.07 07/30/2014   BUN 19 07/30/2014   CO2 25 07/30/2014   TSH 1.04 09/16/2012   INR 1.9 01/26/2015   HGBA1C 5.8* 06/12/2012     Assessment / Plan: 1. Atrial fibrillation. Last pacer check  showed maintenance of NSR. Rare mode switches. She appears to be maintaining sinus rhythm while on Tikosyn. Status post ablation. With prior CVA she needs to remain on anticoagulation. Currently on Coumadin. INR 1.9  today.She was unable to afford the novel anticoagulants. Will follow up in 4 months.  2. Chronic chest pain. Ischemic evaluation has been negative. Some chest pain related to V pacing.   3. History of CVA. On chronic coumadin.  4. Sick sinus syndrome status post pacemaker implant. Now with intermittent complete heart block. Patient has regular followup in pacemaker clinic.  5. Recurrent syncope, multifactorial. She is on chronic Florinef. Continue to liberalize salt and wear support hose. I have little else to offer.

## 2015-02-01 ENCOUNTER — Ambulatory Visit (HOSPITAL_COMMUNITY)
Admission: RE | Admit: 2015-02-01 | Discharge: 2015-02-01 | Disposition: A | Discharge status: Home / Self Care | Payer: Medicare Other | Source: Ambulatory Visit | Attending: Family Medicine | Admitting: Family Medicine

## 2015-02-01 ENCOUNTER — Ambulatory Visit (INDEPENDENT_AMBULATORY_CARE_PROVIDER_SITE_OTHER): Payer: Medicare Other | Admitting: *Deleted

## 2015-02-01 ENCOUNTER — Ambulatory Visit
Admission: RE | Admit: 2015-02-01 | Discharge: 2015-02-01 | Disposition: A | Discharge status: Home / Self Care | Payer: Medicare Other | Source: Ambulatory Visit | Attending: Neurology | Admitting: Neurology

## 2015-02-01 ENCOUNTER — Telehealth: Payer: Self-pay | Admitting: Cardiology

## 2015-02-01 DIAGNOSIS — H547 Unspecified visual loss: Secondary | ICD-10-CM | POA: Diagnosis not present

## 2015-02-01 DIAGNOSIS — H4720 Unspecified optic atrophy: Secondary | ICD-10-CM | POA: Diagnosis not present

## 2015-02-01 DIAGNOSIS — H539 Unspecified visual disturbance: Secondary | ICD-10-CM | POA: Insufficient documentation

## 2015-02-01 DIAGNOSIS — H47291 Other optic atrophy, right eye: Secondary | ICD-10-CM | POA: Diagnosis not present

## 2015-02-01 DIAGNOSIS — I495 Sick sinus syndrome: Secondary | ICD-10-CM | POA: Diagnosis not present

## 2015-02-01 LAB — CUP PACEART REMOTE DEVICE CHECK
Battery Remaining Longevity: 122 mo
Brady Statistic AP VP Percent: 0 %
Brady Statistic AS VP Percent: 0 %
Brady Statistic AS VS Percent: 42 %
Date Time Interrogation Session: 20161128185503
Implantable Lead Implant Date: 20040309
Implantable Lead Serial Number: 304362
Lead Channel Impedance Value: 1150 Ohm
Lead Channel Setting Pacing Amplitude: 2.5 V
MDC IDC LEAD IMPLANT DT: 20040309
MDC IDC LEAD LOCATION: 753859
MDC IDC LEAD LOCATION: 753860
MDC IDC MSMT BATTERY IMPEDANCE: 221 Ohm
MDC IDC MSMT BATTERY VOLTAGE: 2.79 V
MDC IDC MSMT LEADCHNL RV IMPEDANCE VALUE: 655 Ohm
MDC IDC SET LEADCHNL RA PACING AMPLITUDE: 2 V
MDC IDC SET LEADCHNL RV PACING PULSEWIDTH: 0.4 ms
MDC IDC SET LEADCHNL RV SENSING SENSITIVITY: 5.6 mV
MDC IDC STAT BRADY AP VS PERCENT: 57 %

## 2015-02-01 NOTE — Progress Notes (Signed)
VASCULAR LAB PRELIMINARY  PRELIMINARY  PRELIMINARY  PRELIMINARY  Carotid duplex  completed.    Preliminary report:  Bilateral:  1-39% ICA stenosis.  Vertebral artery flow is antegrade.      Bridget Westbrooks, RVT 02/01/2015, 11:59 AM

## 2015-02-01 NOTE — Telephone Encounter (Signed)
LMOVM reminding pt to send remote transmission.   

## 2015-02-02 ENCOUNTER — Telehealth: Payer: Self-pay | Admitting: Neurology

## 2015-02-02 NOTE — Telephone Encounter (Signed)
I called the patient. The CT of the brain does show some small vessel disease, no new changes from a year ago. The patient has had a carotid Doppler study, she was told that this was unremarkable, I have not yet received the report. The patient is felt to have an optic neuropathy, blood work revealed a low B12 level, she is now on B12 supplementation. Hopefully, this is Etiology for visual loss.   CT head 02/01/15:  IMPRESSION: This CT scan of the head without contrast shows the following: 1. Mild age related atrophy and chronic microvascular ischemic changes that are essentially unchanged when compared to the 12/09/2013 CT scan. 2. The right orbit appears normal. The left globe has increased attenuation consistent with prosthesis or prior surgical changes. 3. There are no acute findings.

## 2015-02-03 ENCOUNTER — Telehealth: Payer: Self-pay | Admitting: Neurology

## 2015-02-03 NOTE — Progress Notes (Signed)
Remote pacemaker transmission.   

## 2015-02-03 NOTE — Telephone Encounter (Signed)
  I called patient. The carotid Doppler study was unremarkable.  Carotid Doppler study 02/03/2015:  Summary:  - The vertebral arteries appear patent with antegrade flow. - Findings consistent with 1-39 percent stenosis involving the right internal carotid artery and the left internal carotid artery.

## 2015-02-12 ENCOUNTER — Encounter: Payer: Self-pay | Admitting: Cardiology

## 2015-02-22 ENCOUNTER — Ambulatory Visit (INDEPENDENT_AMBULATORY_CARE_PROVIDER_SITE_OTHER): Payer: Medicare Other | Admitting: Pharmacist Clinician (PhC)/ Clinical Pharmacy Specialist

## 2015-02-22 DIAGNOSIS — I4891 Unspecified atrial fibrillation: Secondary | ICD-10-CM | POA: Diagnosis not present

## 2015-02-22 DIAGNOSIS — I480 Paroxysmal atrial fibrillation: Secondary | ICD-10-CM

## 2015-02-22 DIAGNOSIS — Z5181 Encounter for therapeutic drug level monitoring: Secondary | ICD-10-CM

## 2015-02-22 LAB — POCT INR: INR: 1.8

## 2015-03-11 DIAGNOSIS — R509 Fever, unspecified: Secondary | ICD-10-CM | POA: Diagnosis not present

## 2015-03-11 DIAGNOSIS — R1030 Lower abdominal pain, unspecified: Secondary | ICD-10-CM | POA: Diagnosis not present

## 2015-03-11 DIAGNOSIS — R110 Nausea: Secondary | ICD-10-CM | POA: Diagnosis not present

## 2015-03-11 DIAGNOSIS — Z7901 Long term (current) use of anticoagulants: Secondary | ICD-10-CM | POA: Diagnosis not present

## 2015-03-15 ENCOUNTER — Encounter: Payer: Medicare Other | Admitting: Pharmacist Clinician (PhC)/ Clinical Pharmacy Specialist

## 2015-03-29 ENCOUNTER — Telehealth: Payer: Self-pay | Admitting: Cardiology

## 2015-03-29 MED ORDER — POTASSIUM CHLORIDE CRYS ER 20 MEQ PO TBCR: 40.0000 meq | EXTENDED_RELEASE_TABLET | Freq: Every day | ORAL | Status: DC

## 2015-03-29 MED ORDER — FLUDROCORTISONE ACETATE 0.1 MG PO TABS: ORAL_TABLET | ORAL | Status: DC

## 2015-03-29 MED ORDER — DOFETILIDE 250 MCG PO CAPS: 250.0000 ug | ORAL_CAPSULE | Freq: Two times a day (BID) | ORAL | Status: DC

## 2015-03-29 NOTE — Telephone Encounter (Signed)
°*  STAT* If patient is at the pharmacy, call can be transferred to refill team.   1. Which medications need to be refilled? (please list name of each medication and dose if known) Tikoysn, Potassium, and Florinef   2. Which pharmacy/location (including street and city if local pharmacy) is medication to be sent to? [facility]  3. Do they need a 30 day or 90 day supply? [place]

## 2015-03-29 NOTE — Telephone Encounter (Signed)
Refill sent to the pharmacy electronically.  

## 2015-03-29 NOTE — Telephone Encounter (Signed)
°*  STAT* If patient is at the pharmacy, call can be transferred to refill team.   1. Which medications need to be refilled? (please list name of each medication and dose if known) Tiosykin, potassium and   2. Which pharmacy/location (including street and city if local pharmacy) is medication to be sent to?[facility]   3. Do they need a 30 day or 90 day supply? [place]

## 2015-03-29 NOTE — Telephone Encounter (Signed)
Close encounter  patient has 2 charts ,

## 2015-04-05 ENCOUNTER — Ambulatory Visit (INDEPENDENT_AMBULATORY_CARE_PROVIDER_SITE_OTHER): Payer: Medicare Other | Admitting: Pharmacist Clinician (PhC)/ Clinical Pharmacy Specialist

## 2015-04-05 DIAGNOSIS — Z5181 Encounter for therapeutic drug level monitoring: Secondary | ICD-10-CM

## 2015-04-05 DIAGNOSIS — I480 Paroxysmal atrial fibrillation: Secondary | ICD-10-CM

## 2015-04-05 DIAGNOSIS — I4891 Unspecified atrial fibrillation: Secondary | ICD-10-CM

## 2015-04-05 LAB — POCT INR: INR: 3.4

## 2015-04-26 ENCOUNTER — Ambulatory Visit (INDEPENDENT_AMBULATORY_CARE_PROVIDER_SITE_OTHER): Payer: Medicare Other | Admitting: Pharmacist Clinician (PhC)/ Clinical Pharmacy Specialist

## 2015-04-26 DIAGNOSIS — I480 Paroxysmal atrial fibrillation: Secondary | ICD-10-CM

## 2015-04-26 DIAGNOSIS — I4891 Unspecified atrial fibrillation: Secondary | ICD-10-CM | POA: Diagnosis not present

## 2015-04-26 DIAGNOSIS — Z5181 Encounter for therapeutic drug level monitoring: Secondary | ICD-10-CM

## 2015-04-26 LAB — POCT INR: INR: 2.3

## 2015-04-26 MED ORDER — WARFARIN SODIUM 4 MG PO TABS: ORAL_TABLET | ORAL | Status: DC

## 2015-04-30 ENCOUNTER — Ambulatory Visit: Payer: Medicare Other | Admitting: Neurology

## 2015-05-03 ENCOUNTER — Ambulatory Visit (INDEPENDENT_AMBULATORY_CARE_PROVIDER_SITE_OTHER): Payer: Medicare Other | Admitting: *Deleted

## 2015-05-03 ENCOUNTER — Telehealth: Payer: Self-pay | Admitting: Cardiology

## 2015-05-03 DIAGNOSIS — I495 Sick sinus syndrome: Secondary | ICD-10-CM | POA: Diagnosis not present

## 2015-05-03 NOTE — Telephone Encounter (Signed)
Spoke with pt and reminded pt of remote transmission that is due today. Pt verbalized understanding.   

## 2015-05-03 NOTE — Progress Notes (Signed)
Remote pacemaker transmission.   

## 2015-05-21 ENCOUNTER — Encounter: Payer: Self-pay | Admitting: Cardiology

## 2015-05-21 LAB — CUP PACEART REMOTE DEVICE CHECK
Battery Impedance: 245 Ohm
Brady Statistic AP VP Percent: 0 %
Brady Statistic AP VS Percent: 55 %
Brady Statistic AS VP Percent: 0 %
Brady Statistic AS VS Percent: 44 %
Date Time Interrogation Session: 20170227151310
Implantable Lead Implant Date: 20040309
Implantable Lead Implant Date: 20040309
Implantable Lead Location: 753860
Implantable Lead Serial Number: 304362
Lead Channel Impedance Value: 1150 Ohm
Lead Channel Sensing Intrinsic Amplitude: 11.2 mV
Lead Channel Setting Pacing Amplitude: 2 V
Lead Channel Setting Pacing Pulse Width: 0.4 ms
MDC IDC LEAD LOCATION: 753859
MDC IDC MSMT BATTERY REMAINING LONGEVITY: 119 mo
MDC IDC MSMT BATTERY VOLTAGE: 2.79 V
MDC IDC MSMT LEADCHNL RA PACING THRESHOLD AMPLITUDE: 0.625 V
MDC IDC MSMT LEADCHNL RA PACING THRESHOLD PULSEWIDTH: 0.4 ms
MDC IDC MSMT LEADCHNL RV IMPEDANCE VALUE: 671 Ohm
MDC IDC MSMT LEADCHNL RV PACING THRESHOLD AMPLITUDE: 0.375 V
MDC IDC MSMT LEADCHNL RV PACING THRESHOLD PULSEWIDTH: 0.4 ms
MDC IDC SET LEADCHNL RV PACING AMPLITUDE: 2.5 V
MDC IDC SET LEADCHNL RV SENSING SENSITIVITY: 5.6 mV

## 2015-05-24 ENCOUNTER — Ambulatory Visit (INDEPENDENT_AMBULATORY_CARE_PROVIDER_SITE_OTHER): Payer: Medicare Other | Admitting: Pharmacist Clinician (PhC)/ Clinical Pharmacy Specialist

## 2015-05-24 DIAGNOSIS — I480 Paroxysmal atrial fibrillation: Secondary | ICD-10-CM | POA: Diagnosis not present

## 2015-05-24 DIAGNOSIS — I4891 Unspecified atrial fibrillation: Secondary | ICD-10-CM

## 2015-05-24 DIAGNOSIS — Z5181 Encounter for therapeutic drug level monitoring: Secondary | ICD-10-CM | POA: Diagnosis not present

## 2015-05-24 LAB — POCT INR: INR: 3.3

## 2015-06-21 ENCOUNTER — Ambulatory Visit (INDEPENDENT_AMBULATORY_CARE_PROVIDER_SITE_OTHER): Payer: Medicare Other | Admitting: Pharmacist Clinician (PhC)/ Clinical Pharmacy Specialist

## 2015-06-21 DIAGNOSIS — Z5181 Encounter for therapeutic drug level monitoring: Secondary | ICD-10-CM

## 2015-06-21 DIAGNOSIS — I4891 Unspecified atrial fibrillation: Secondary | ICD-10-CM | POA: Diagnosis not present

## 2015-06-21 DIAGNOSIS — I480 Paroxysmal atrial fibrillation: Secondary | ICD-10-CM

## 2015-06-21 LAB — POCT INR: INR: 2.1

## 2015-07-09 ENCOUNTER — Ambulatory Visit (INDEPENDENT_AMBULATORY_CARE_PROVIDER_SITE_OTHER): Payer: Medicare Other | Admitting: Pharmacist Clinician (PhC)/ Clinical Pharmacy Specialist

## 2015-07-09 ENCOUNTER — Encounter: Payer: Self-pay | Admitting: Cardiology

## 2015-07-09 ENCOUNTER — Ambulatory Visit (INDEPENDENT_AMBULATORY_CARE_PROVIDER_SITE_OTHER): Payer: Medicare Other | Admitting: Cardiology

## 2015-07-09 VITALS — BP 128/68 | HR 61 | Ht 65.0 in | Wt 115.0 lb

## 2015-07-09 DIAGNOSIS — R55 Syncope and collapse: Secondary | ICD-10-CM | POA: Diagnosis not present

## 2015-07-09 DIAGNOSIS — I442 Atrioventricular block, complete: Secondary | ICD-10-CM

## 2015-07-09 DIAGNOSIS — R079 Chest pain, unspecified: Secondary | ICD-10-CM | POA: Diagnosis not present

## 2015-07-09 DIAGNOSIS — I480 Paroxysmal atrial fibrillation: Secondary | ICD-10-CM | POA: Diagnosis not present

## 2015-07-09 DIAGNOSIS — Z5181 Encounter for therapeutic drug level monitoring: Secondary | ICD-10-CM | POA: Diagnosis not present

## 2015-07-09 DIAGNOSIS — I4891 Unspecified atrial fibrillation: Secondary | ICD-10-CM

## 2015-07-09 DIAGNOSIS — I951 Orthostatic hypotension: Secondary | ICD-10-CM

## 2015-07-09 LAB — POCT INR: INR: 2.4

## 2015-07-09 NOTE — Patient Instructions (Signed)
Continue your current therapy  I will see you in 6 months.   

## 2015-07-09 NOTE — Progress Notes (Signed)
Katrina Rodriguez Date of Birth: December 20, 1928 Medical Record [id number]  History of Present Illness: Katrina Rodriguez is seen today for follow up of syncope. She has a history of atrial fibrillation. She is on chronic Tikosyn therapy. She has had a previous atrial fibrillation ablation. She has a history of recurrent syncope. She was noted to have intermittent complete heart block. Her pacemaker was reprogrammed to DDD with AV search to minimize V pacing.  She has a history of chronic chest pain with normal coronary anatomy by cardiac catheterization. She had a normal nuclear stress test 2/13. She has known sensitivity with V pacing with chest pain.    She has lost much of her vision and is no longer able to drive. She still has spells where everything goes black. She has no injuries. She did have an episode this week with acute dizziness and vertigo while supine. She has occasional ache in chest. Pacer check in Feb showed no Afib. She had one 5 second SVT at 150.   Current Outpatient Prescriptions on File Prior to Visit  Medication Sig Dispense Refill  . dofetilide (TIKOSYN) 250 MCG capsule Take 1 capsule (250 mcg total) by mouth 2 (two) times daily. 180 capsule 1  . fludrocortisone (FLORINEF) 0.1 MG tablet Take 2 tablets (0.2 mg) twice a day 360 tablet 1  . potassium chloride SA (K-DUR,KLOR-CON) 20 MEQ tablet Take 2 tablets (40 mEq total) by mouth daily. 180 tablet 1  . vitamin B-12 (CYANOCOBALAMIN) 1000 MCG tablet Take 1,000 mcg by mouth daily.    Marland Kitchen warfarin (COUMADIN) 4 MG tablet Take 1 tablet by mouth  daily or as directed by  coumadin clinic 90 tablet 1   No current facility-administered medications on file prior to visit.    Allergies  Allergen Reactions  . Amiodarone Other (See Comments)    Caused thyroid problems  . Amoxicillin Hives  . Aspirin Other (See Comments)    On tikosyn and warfarin  . Codeine Other (See Comments)    Causes BP to drop  . Darvocet [Propoxyphene N-Acetaminophen] Hives   . Demerol Itching  . Penicillins Hives    Past Medical History  Diagnosis Date  . Thrombocytopenia ([place])   . H/O: CVA (cardiovascular accident)     Right internal capsule  . Orthostasis   . Anticoagulant long-term use   . Pacemaker   . Tachycardia-bradycardia syndrome ([place])   . Mild pulmonary hypertension ([place])   . Chronic anemia   . H/O syncope   . Chest pain     Has normal coronaries per cath in 2005; negative nuclear in 2010  . Atrial fibrillation (H[facility])     History of ablation - Dr. Rayann Heman  . Hyperthyroidism   . Retinal detachment   . High risk medication use     Tikosyn & Coumadin  . Arthritis   . 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 Cardiac pacemaker placement    . Cardiac catheterization    . Retinal detachment surgery    . Permanent pacemaker generator change N/A 06/12/2012    Procedure: PERMANENT PACEMAKER GENERATOR CHANGE;  Surgeon: Evans Lance, MD;  Location: [facility] CATH LAB;  Service: Cardiovascular;  Laterality: N/A;    History  Smoking status  . Never Smoker   Smokeless tobacco  . Never Used    History  Alcohol Use No    Family History  Problem Relation Age of Onset  .  Heart disease Father   . Heart disease Brother   . Heart disease Brother   . Hypertension Mother   . Stroke Father   . Diabetes Sister     Review of Systems: The review of systems is positive for  vision loss.  All other systems were reviewed and are negative.  Physical Exam: BP 128/68 mmHg  Pulse 61  Ht 5\' 5"  (1.651 m)  Wt 52.164 kg (115 lb)  BMI 19.14 kg/m2 Patient is very pleasant and in no acute distress. Skin is warm and dry. Color is normal.  HEENT is unremarkable. Normocephalic/atraumatic. PERRL. Sclera are nonicteric. Neck is supple. No masses. No JVD. Lungs are clear. Her pacemaker site is OK. Cardiac exam shows a regular rate and rhythm. No murmur or S3. Abdomen is soft. Extremities are without edema. He left  ankle is larger than the right. Gait and ROM are intact but she is a little unsteady. No gross neurologic deficits noted.   LABORATORY DATA:  Lab Results  Component Value Date   WBC 4.3 07/30/2014   HGB 14.5 07/30/2014   HCT 42.9 07/30/2014   PLT 134* 07/30/2014   GLUCOSE 90 07/30/2014   CHOL 217* 07/30/2014   TRIG 142 07/30/2014   HDL 53 07/30/2014   LDLCALC 136* 07/30/2014   ALT 8 07/30/2014   AST 19 07/30/2014   NA 141 07/30/2014   K 4.0 07/30/2014   CL 106 07/30/2014   CREATININE 1.07 07/30/2014   BUN 19 07/30/2014   CO2 25 07/30/2014   TSH 1.04 09/16/2012   INR 2.4 07/09/2015   HGBA1C 5.8* 06/12/2012   Ecg today shows NSR with rate 61. Normal. QTc 469 msec. I have personally reviewed and interpreted this study.   Assessment / Plan: 1. Atrial fibrillation. Last pacer check  showed maintenance of NSR. No mode switches. She appears to be maintaining sinus rhythm while on Tikosyn. Status post ablation. With prior CVA she needs to remain on anticoagulation. Currently on Coumadin. INR 2.4 today.She was unable to afford the novel anticoagulants. Will follow up in 4 months.  2. Chronic chest pain. Ischemic evaluation has been negative. Some chest pain related to V pacing.   3. History of CVA. On chronic coumadin.  4. Sick sinus syndrome and CHB status post pacemaker implant.  Patient has regular followup in pacemaker clinic.  5. Recurrent syncope, multifactorial. She is on chronic Florinef. Continue to liberalize salt and wear support hose. I have little else to offer.

## 2015-07-29 NOTE — Progress Notes (Signed)
Electrophysiology Office Note Date: 07/30/2015  ID:  Tressia, Golis [birth year], MRN [id number]  PCP: [name], MD Primary Cardiologist: [name]: Allred  CC: Pacemaker follow-up  Katrina Rodriguez is a 82 y.o. female seen today for Dr Rayann Heman.  She presents today for routine electrophysiology followup.  Since last being seen in our clinic, the patient reports doing reasonably well.  She has trouble with her vision and continue to have orthostatic intolerance.  She denies chest pain, palpitations, dyspnea, PND, orthopnea, nausea, vomiting, syncope, edema, weight gain, or early satiety.  Device History: MDT dual chamber PPM implanted 2004 for symptomatic bradycardia, gen change 2014   Past Medical History  Diagnosis Date  . Thrombocytopenia ([place])   . H/O: CVA (cardiovascular accident)     Right internal capsule  . Orthostasis   . Anticoagulant long-term use   . Pacemaker   . Tachycardia-bradycardia syndrome ([place])   . Mild pulmonary hypertension ([place])   . Chronic anemia   . H/O syncope   . Chest pain     Has normal coronaries per cath in 2005; negative nuclear in 2010  . Atrial fibrillation (HCC)     History of ablation - Dr. Rayann Heman  . Hyperthyroidism   . Retinal detachment   . High risk medication use     Tikosyn & Coumadin  . Arthritis   . 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   Past Surgical History  Procedure Laterality Date  . Cardiac pacemaker placement    . Cardiac catheterization    . Retinal detachment surgery    . Permanent pacemaker generator change N/A 06/12/2012    Procedure: PERMANENT PACEMAKER GENERATOR CHANGE;  Surgeon: Evans Lance, MD;  Location: [facility] CATH LAB;  Service: Cardiovascular;  Laterality: N/A;    Current Outpatient Prescriptions  Medication Sig Dispense Refill  . dofetilide (TIKOSYN) 250 MCG capsule Take 1 capsule (250 mcg total) by mouth 2 (two) times daily. 180 capsule 1  . fludrocortisone (FLORINEF) 0.1 MG  tablet Take 2 tablets (0.2 mg) twice a day 360 tablet 1  . potassium chloride SA (K-DUR,KLOR-CON) 20 MEQ tablet Take 2 tablets (40 mEq total) by mouth daily. 180 tablet 1  . vitamin B-12 (CYANOCOBALAMIN) 1000 MCG tablet Take 1,000 mcg by mouth daily.    Marland Kitchen warfarin (COUMADIN) 4 MG tablet Take 1 tablet by mouth  daily or as directed by  coumadin clinic 90 tablet 1   No current facility-administered medications for this visit.    Allergies:   Amiodarone; Amoxicillin; Aspirin; Codeine; Darvocet; Demerol; and Penicillins   Social History: Social History   Social History  . Marital Status: Widowed    Spouse Name: N/A  . Number of Children: 2  . Years of Education: 10   Occupational History  . Homemaker    Social History Main Topics  . Smoking status: Never Smoker   . Smokeless tobacco: Never Used  . Alcohol Use: No  . Drug Use: No  . Sexual Activity: No   Other Topics Concern  . Not on file   Social History Narrative   Lives at home alone.   Caffeine use: Drinks coffee daily       Family History: Family History  Problem Relation Age of Onset  . Heart disease Father   . Heart disease Brother   . Heart disease Brother   . Hypertension Mother   . Stroke Father   . Diabetes Sister   . Heart attack Father   .  Heart attack Sister      Review of Systems: All other systems reviewed and are otherwise negative except as noted above.   Physical Exam: VS:  BP 140/70 mmHg  Pulse 64  Ht 5\' 5"  (1.651 m)  Wt 114 lb 6.4 oz (51.891 kg)  BMI 19.04 kg/m2 , BMI Body mass index is 19.04 kg/(m^2).  GEN- The patient is elderly and frail appearing, alert and oriented x 3 today.   HEENT: normocephalic, atraumatic; sclera clear, conjunctiva pink; hearing intact; oropharynx clear; neck supple  Lungs- Clear to ausculation bilaterally, normal work of breathing.  No wheezes, rales, rhonchi Heart- Regular rate and rhythm, no murmurs, rubs or gallops  GI- soft, non-tender, non-distended,  bowel sounds present  Extremities- no clubbing, cyanosis, or edema; DP/PT/radial pulses 2+ bilaterally MS- no significant deformity or atrophy Skin- warm and dry, no rash or lesion; PPM pocket well healed Psych- euthymic mood, full affect Neuro- strength and sensation are intact  PPM Interrogation- reviewed in detail today,  See PACEART report  EKG:  EKG is not ordered today.  Recent Labs: 07/30/2014: ALT 8; BUN 19; Creat 1.07; Hemoglobin 14.5; Platelets 134*; Potassium 4.0; Sodium 141   Wt Readings from Last 3 Encounters:  07/30/15 114 lb 6.4 oz (51.891 kg)  07/09/15 115 lb (52.164 kg)  01/26/15 116 lb (52.617 kg)     Other studies Reviewed: Additional studies/ records that were reviewed today include: Dr [name] and Dr Jackalyn Lombard office notes  Assessment and Plan:  1.  Symptomatic bradycardia Normal PPM function See Pace Art report No changes today  2.  Persistent atrial fibrillation Burden by device interrogation today 0% V rates controlled Continue Warfarin for CHADS2VASC of 5. CBC today Continue tikosyn. QTc stable by recent EKG. BMET, Mg today   3.  Orthostatic intolerance/pre-syncope Continue Florinef  4. Code status She wishes to be DNR. Form filled out today at patient's request.   Current medicines are reviewed at length with the patient today.   The patient does not have concerns regarding her medicines.  The following changes were made today:  none  Labs/ tests ordered today include: BMET, Mg, CBC  Disposition:   Follow up with [facility] transmissions, Dr [name] as scheduled, me in 1 year      Signed, Chanetta Marshall, NP 07/30/2015 10:47 AM  [place] [address] [phone] (office) [phone] (fax)

## 2015-07-30 ENCOUNTER — Encounter: Payer: Self-pay | Admitting: Nurse Practitioner

## 2015-07-30 ENCOUNTER — Encounter: Payer: Self-pay | Admitting: Internal Medicine

## 2015-07-30 ENCOUNTER — Ambulatory Visit (INDEPENDENT_AMBULATORY_CARE_PROVIDER_SITE_OTHER): Payer: Medicare Other | Admitting: Nurse Practitioner

## 2015-07-30 VITALS — BP 140/70 | HR 64 | Ht 65.0 in | Wt 114.4 lb

## 2015-07-30 DIAGNOSIS — I951 Orthostatic hypotension: Secondary | ICD-10-CM | POA: Diagnosis not present

## 2015-07-30 DIAGNOSIS — R001 Bradycardia, unspecified: Secondary | ICD-10-CM | POA: Diagnosis not present

## 2015-07-30 DIAGNOSIS — I481 Persistent atrial fibrillation: Secondary | ICD-10-CM | POA: Diagnosis not present

## 2015-07-30 DIAGNOSIS — I4819 Other persistent atrial fibrillation: Secondary | ICD-10-CM

## 2015-07-30 DIAGNOSIS — I498 Other specified cardiac arrhythmias: Secondary | ICD-10-CM | POA: Diagnosis not present

## 2015-07-30 LAB — CBC
HCT: 40.2 % (ref 35.0–45.0)
Hemoglobin: 13.5 g/dL (ref 11.7–15.5)
MCH: 31.5 pg (ref 27.0–33.0)
MCHC: 33.6 g/dL (ref 32.0–36.0)
MCV: 93.9 fL (ref 80.0–100.0)
MPV: 10.6 fL (ref 7.5–12.5)
PLATELETS: 145 10*3/uL (ref 140–400)
RBC: 4.28 MIL/uL (ref 3.80–5.10)
RDW: 13.6 % (ref 11.0–15.0)
WBC: 4.2 10*3/uL (ref 3.8–10.8)

## 2015-07-30 LAB — BASIC METABOLIC PANEL
BUN: 17 mg/dL (ref 7–25)
CO2: 27 mmol/L (ref 20–31)
Calcium: 8.5 mg/dL — ABNORMAL LOW (ref 8.6–10.4)
Chloride: 105 mmol/L (ref 98–110)
Creat: 1.25 mg/dL — ABNORMAL HIGH (ref 0.60–0.88)
Glucose, Bld: 86 mg/dL (ref 65–99)
POTASSIUM: 4.2 mmol/L (ref 3.5–5.3)
SODIUM: 141 mmol/L (ref 135–146)

## 2015-07-30 LAB — MAGNESIUM: MAGNESIUM: 2.4 mg/dL (ref 1.5–2.5)

## 2015-07-30 NOTE — Patient Instructions (Signed)
Medication Instructions:   Your physician recommends that you continue on your current medications as directed. Please refer to the Current Medication list given to you today.    If you need a refill on your cardiac medications before your next appointment, please call your pharmacy.  Labwork:  CBC BMET AND MAG TODAY    Testing/Procedures:  NONE ORDER TODAY    Follow-Up:  Your physician wants you to follow-up in: [place] will receive a reminder letter in the mail two months in advance. If you don't receive a letter, please call our office to schedule the follow-up appointment.  Remote monitoring is used to monitor your Pacemaker of ICD from home. This monitoring reduces the number of office visits required to check your device to one time per year. It allows [place] to keep an eye on the functioning of your device to ensure it is working properly. You are scheduled for a device check from home on .10/29/15..You may send your transmission at any time that day. If you have a wireless device, the transmission will be sent automatically. After your physician reviews your transmission, you will receive a postcard with your next transmission date.    Any Other Special Instructions Will Be Listed Below (If Applicable).

## 2015-07-31 LAB — CUP PACEART INCLINIC DEVICE CHECK
Implantable Lead Implant Date: 20040309
Implantable Lead Location: 753860
MDC IDC LEAD IMPLANT DT: 20040309
MDC IDC LEAD LOCATION: 753859
MDC IDC LEAD SERIAL: 304362
MDC IDC SESS DTM: 20170527041155

## 2015-08-04 ENCOUNTER — Telehealth: Payer: Self-pay | Admitting: *Deleted

## 2015-08-04 NOTE — Telephone Encounter (Signed)
-----   Message from Patsey Berthold, NP sent at 08/04/2015  8:25 AM EDT ----- Please call patient, kidney function slightly worse.  Please ask her to make sure she is drinking enough water through the day.  Labs forwarded to Dr Caryl Comes for review.

## 2015-08-06 ENCOUNTER — Ambulatory Visit (INDEPENDENT_AMBULATORY_CARE_PROVIDER_SITE_OTHER): Payer: Medicare Other | Admitting: Pharmacist

## 2015-08-06 DIAGNOSIS — I4891 Unspecified atrial fibrillation: Secondary | ICD-10-CM | POA: Diagnosis not present

## 2015-08-06 DIAGNOSIS — Z5181 Encounter for therapeutic drug level monitoring: Secondary | ICD-10-CM

## 2015-08-06 DIAGNOSIS — I481 Persistent atrial fibrillation: Secondary | ICD-10-CM | POA: Diagnosis not present

## 2015-08-06 DIAGNOSIS — I4819 Other persistent atrial fibrillation: Secondary | ICD-10-CM

## 2015-08-06 LAB — POCT INR: INR: 3

## 2015-08-06 MED ORDER — WARFARIN SODIUM 4 MG PO TABS: ORAL_TABLET | ORAL | Status: DC

## 2015-08-11 ENCOUNTER — Other Ambulatory Visit: Payer: Self-pay | Admitting: *Deleted

## 2015-08-11 DIAGNOSIS — R7989 Other specified abnormal findings of blood chemistry: Secondary | ICD-10-CM

## 2015-09-06 ENCOUNTER — Ambulatory Visit (INDEPENDENT_AMBULATORY_CARE_PROVIDER_SITE_OTHER): Payer: Medicare Other | Admitting: Pharmacist Clinician (PhC)/ Clinical Pharmacy Specialist

## 2015-09-06 DIAGNOSIS — Z5181 Encounter for therapeutic drug level monitoring: Secondary | ICD-10-CM

## 2015-09-06 DIAGNOSIS — I4891 Unspecified atrial fibrillation: Secondary | ICD-10-CM | POA: Diagnosis not present

## 2015-09-06 DIAGNOSIS — I481 Persistent atrial fibrillation: Secondary | ICD-10-CM | POA: Diagnosis not present

## 2015-09-06 DIAGNOSIS — I4819 Other persistent atrial fibrillation: Secondary | ICD-10-CM

## 2015-09-06 LAB — POCT INR: INR: 2.6

## 2015-09-23 ENCOUNTER — Other Ambulatory Visit: Payer: Self-pay | Admitting: *Deleted

## 2015-09-23 ENCOUNTER — Other Ambulatory Visit: Payer: Self-pay | Admitting: Cardiology

## 2015-09-23 MED ORDER — DOFETILIDE 250 MCG PO CAPS: 250.0000 ug | ORAL_CAPSULE | Freq: Two times a day (BID) | ORAL | Status: DC

## 2015-10-04 ENCOUNTER — Ambulatory Visit (INDEPENDENT_AMBULATORY_CARE_PROVIDER_SITE_OTHER): Payer: Medicare Other | Admitting: Pharmacist Clinician (PhC)/ Clinical Pharmacy Specialist

## 2015-10-04 DIAGNOSIS — Z5181 Encounter for therapeutic drug level monitoring: Secondary | ICD-10-CM | POA: Diagnosis not present

## 2015-10-04 DIAGNOSIS — I4891 Unspecified atrial fibrillation: Secondary | ICD-10-CM | POA: Diagnosis not present

## 2015-10-04 LAB — POCT INR: INR: 2

## 2015-10-21 ENCOUNTER — Encounter: Payer: Self-pay | Admitting: Podiatry

## 2015-10-21 ENCOUNTER — Ambulatory Visit (INDEPENDENT_AMBULATORY_CARE_PROVIDER_SITE_OTHER): Payer: Medicare Other | Admitting: Podiatry

## 2015-10-21 VITALS — BP 155/79 | HR 75 | Resp 14

## 2015-10-21 DIAGNOSIS — M2010 Hallux valgus (acquired), unspecified foot: Secondary | ICD-10-CM

## 2015-10-21 DIAGNOSIS — M2042 Other hammer toe(s) (acquired), left foot: Secondary | ICD-10-CM | POA: Diagnosis not present

## 2015-10-21 DIAGNOSIS — M2041 Other hammer toe(s) (acquired), right foot: Secondary | ICD-10-CM | POA: Diagnosis not present

## 2015-10-21 NOTE — Progress Notes (Signed)
This patient presents to the office with chief complaint of painful second and third toes, right foot. She says that her toes feel painful walking and wearing her shoes. She says the toes are not painful when she wears open toed shoes. She also has nail spicule growth to both great toenail. She does not experience any pain or discomfort, but she notes they do not grow properly. She presents the office today for an evaluation and treatment of her feet.   GENERAL APPEARANCE: Alert, conversant. Appropriately groomed. No acute distress.  VASCULAR: Pedal pulses are  palpable at  [facility] and PT bilateral.  Capillary refill time is immediate to all digits,  Normal temperature gradient.    NEUROLOGIC: sensation is normal to 5.07 monofilament at 5/5 sites bilateral.  Light touch is intact bilateral, Muscle strength normal.  MUSCULOSKELETAL: acceptable muscle strength, tone and stability bilateral.  Intrinsic muscluature intact bilateral.  HAV   B/l   hAMMER TOES 2,3 RIGHT FOOT.   DERMATOLOGIC: skin color, texture, and turgor are within normal limits.  No preulcerative lesions or ulcers  are seen, no interdigital maceration noted.  No open lesions present.  Digital nails are asymptomatic. No drainage noted.   DX>  HAV 1st MPJ  B/L  Hammer toes 2,3 B/L   TX  IE  Dispense crest pad right foot.  RTC prn.   Gardiner Barefoot DPM

## 2015-10-21 NOTE — Progress Notes (Signed)
   Subjective:    Patient ID: Katrina Rodriguez, female    DOB: [birth year], 82 y.o.   MRN: [id number]  HPI    Review of Systems  All other systems reviewed and are negative.      Objective:   Physical Exam        Assessment & Plan:

## 2015-10-28 ENCOUNTER — Ambulatory Visit: Payer: Self-pay | Admitting: Podiatry

## 2015-10-29 ENCOUNTER — Ambulatory Visit (INDEPENDENT_AMBULATORY_CARE_PROVIDER_SITE_OTHER): Payer: Medicare Other | Admitting: *Deleted

## 2015-10-29 DIAGNOSIS — I495 Sick sinus syndrome: Secondary | ICD-10-CM | POA: Diagnosis not present

## 2015-10-29 DIAGNOSIS — M25522 Pain in left elbow: Secondary | ICD-10-CM | POA: Diagnosis not present

## 2015-10-29 NOTE — Progress Notes (Signed)
Remote pacemaker transmission.   

## 2015-11-01 ENCOUNTER — Ambulatory Visit (INDEPENDENT_AMBULATORY_CARE_PROVIDER_SITE_OTHER): Payer: Medicare Other | Admitting: Pharmacist Clinician (PhC)/ Clinical Pharmacy Specialist

## 2015-11-01 DIAGNOSIS — I4891 Unspecified atrial fibrillation: Secondary | ICD-10-CM

## 2015-11-01 DIAGNOSIS — Z5181 Encounter for therapeutic drug level monitoring: Secondary | ICD-10-CM | POA: Diagnosis not present

## 2015-11-01 LAB — POCT INR: INR: 2.7

## 2015-11-04 ENCOUNTER — Encounter: Payer: Self-pay | Admitting: Cardiology

## 2015-11-11 ENCOUNTER — Other Ambulatory Visit: Payer: Self-pay

## 2015-11-11 LAB — CUP PACEART REMOTE DEVICE CHECK
Brady Statistic AP VS Percent: 58 %
Brady Statistic AS VS Percent: 42 %
Implantable Lead Implant Date: 20040309
Implantable Lead Serial Number: 304362
Lead Channel Impedance Value: 686 Ohm
Lead Channel Pacing Threshold Pulse Width: 0.4 ms
Lead Channel Sensing Intrinsic Amplitude: 11.2 mV
Lead Channel Setting Pacing Amplitude: 2 V
Lead Channel Setting Sensing Sensitivity: 5.6 mV
MDC IDC LEAD IMPLANT DT: 20040309
MDC IDC LEAD LOCATION: 753859
MDC IDC LEAD LOCATION: 753860
MDC IDC MSMT BATTERY IMPEDANCE: 294 Ohm
MDC IDC MSMT BATTERY REMAINING LONGEVITY: 113 mo
MDC IDC MSMT BATTERY VOLTAGE: 2.79 V
MDC IDC MSMT LEADCHNL RA IMPEDANCE VALUE: 1123 Ohm
MDC IDC MSMT LEADCHNL RA PACING THRESHOLD AMPLITUDE: 0.75 V
MDC IDC MSMT LEADCHNL RA PACING THRESHOLD PULSEWIDTH: 0.4 ms
MDC IDC MSMT LEADCHNL RV PACING THRESHOLD AMPLITUDE: 0.375 V
MDC IDC SESS DTM: 20170825114844
MDC IDC SET LEADCHNL RV PACING AMPLITUDE: 2.5 V
MDC IDC SET LEADCHNL RV PACING PULSEWIDTH: 0.4 ms
MDC IDC STAT BRADY AP VP PERCENT: 0 %
MDC IDC STAT BRADY AS VP PERCENT: 0 %

## 2015-11-29 ENCOUNTER — Ambulatory Visit (INDEPENDENT_AMBULATORY_CARE_PROVIDER_SITE_OTHER): Payer: Medicare Other | Admitting: Pharmacist

## 2015-11-29 DIAGNOSIS — Z5181 Encounter for therapeutic drug level monitoring: Secondary | ICD-10-CM

## 2015-11-29 DIAGNOSIS — I4891 Unspecified atrial fibrillation: Secondary | ICD-10-CM | POA: Diagnosis not present

## 2015-11-29 LAB — POCT INR: INR: 2.7

## 2015-12-12 ENCOUNTER — Emergency Department (HOSPITAL_COMMUNITY): Payer: Medicare Other

## 2015-12-12 ENCOUNTER — Encounter (HOSPITAL_COMMUNITY): Payer: Self-pay | Admitting: Emergency Medicine

## 2015-12-12 ENCOUNTER — Emergency Department (HOSPITAL_COMMUNITY)
Admission: EM | Admit: 2015-12-12 | Discharge: 2015-12-13 | Disposition: A | Discharge status: Home / Self Care | Payer: Medicare Other | Attending: Emergency Medicine | Admitting: Emergency Medicine

## 2015-12-12 DIAGNOSIS — Z950 Presence of cardiac pacemaker: Secondary | ICD-10-CM | POA: Diagnosis not present

## 2015-12-12 DIAGNOSIS — I493 Ventricular premature depolarization: Secondary | ICD-10-CM | POA: Insufficient documentation

## 2015-12-12 DIAGNOSIS — I10 Essential (primary) hypertension: Secondary | ICD-10-CM | POA: Insufficient documentation

## 2015-12-12 DIAGNOSIS — Z8673 Personal history of transient ischemic attack (TIA), and cerebral infarction without residual deficits: Secondary | ICD-10-CM | POA: Diagnosis not present

## 2015-12-12 DIAGNOSIS — R0789 Other chest pain: Secondary | ICD-10-CM | POA: Diagnosis not present

## 2015-12-12 DIAGNOSIS — Z7901 Long term (current) use of anticoagulants: Secondary | ICD-10-CM | POA: Diagnosis not present

## 2015-12-12 DIAGNOSIS — R079 Chest pain, unspecified: Secondary | ICD-10-CM

## 2015-12-12 LAB — CBC
HEMATOCRIT: 41.3 % (ref 36.0–46.0)
HEMOGLOBIN: 13.5 g/dL (ref 12.0–15.0)
MCH: 31.7 pg (ref 26.0–34.0)
MCHC: 32.7 g/dL (ref 30.0–36.0)
MCV: 96.9 fL (ref 78.0–100.0)
Platelets: 139 10*3/uL — ABNORMAL LOW (ref 150–400)
RBC: 4.26 MIL/uL (ref 3.87–5.11)
RDW: 13.4 % (ref 11.5–15.5)
WBC: 5.8 10*3/uL (ref 4.0–10.5)

## 2015-12-12 LAB — BASIC METABOLIC PANEL
ANION GAP: 7 (ref 5–15)
BUN: 23 mg/dL — ABNORMAL HIGH (ref 6–20)
CALCIUM: 8.7 mg/dL — AB (ref 8.9–10.3)
CO2: 27 mmol/L (ref 22–32)
Chloride: 108 mmol/L (ref 101–111)
Creatinine, Ser: 1.24 mg/dL — ABNORMAL HIGH (ref 0.44–1.00)
GFR, EST AFRICAN AMERICAN: 45 mL/min — AB (ref >60)
GFR, EST NON AFRICAN AMERICAN: 38 mL/min — AB (ref >60)
Glucose, Bld: 98 mg/dL (ref 65–99)
POTASSIUM: 3.8 mmol/L (ref 3.5–5.1)
SODIUM: 142 mmol/L (ref 135–145)

## 2015-12-12 LAB — I-STAT TROPONIN, ED: TROPONIN I, POC: 0 ng/mL (ref 0.00–0.08)

## 2015-12-12 LAB — PROTIME-INR
INR: 2.39
PROTHROMBIN TIME: 26.5 s — AB (ref 11.4–15.2)

## 2015-12-12 NOTE — ED Provider Notes (Signed)
[facility] DEPT Provider Note   CSN: [id number] Arrival date & time: 12/12/15  1946     History   Chief Complaint Chief Complaint  Patient presents with  . Chest Pain    HPI Katrina Rodriguez is a 82 y.o. female.  HPI Patient presents the emergency department intermittent sharp chest discomfort over the past 24-36 hours.  She states the episodes are transient.  This evening she was having an episode and felt lightheaded and weak.  Nursing notes state syncope but friends that are in the room said that she never completely passed out.  Patient agrees and reports no loss of consciousness.  Patient ports the chest discomfort at this time although during the history she had one transient episode of chest discomfort in a PVC was noted on the monitor at the time.  She denies shortness of breath.  No exertional shortness of breath or chest pain.  No history of coronary artery disease.  She does have a pacemaker and history of paroxysmal atrial fibrillation.  She is on Coumadin and compliant with all of her medications.  She is also on Tikosyn.  No other complaints at this time.  No fevers or chills.  Denies cough.  Denies abdominal pain.  No urinary complaints   Past Medical History:  Diagnosis Date  . Anticoagulant long-term use   . Arthritis   . Atrial fibrillation (HCC)    History of ablation - Dr. Rayann Heman  . Chest pain    Has normal coronaries per cath in 2005; negative nuclear in 2010  . Chronic anemia   . H/O syncope   . H/O: CVA (cardiovascular accident)    Right internal capsule  . High risk medication use    Tikosyn & Coumadin  . Hyperthyroidism   . Mild pulmonary hypertension   . 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 Orthostasis   . Pacemaker   . Retinal detachment   . Tachycardia-bradycardia syndrome ([place])   . Thrombocytopenia [facility])     Patient Active Problem List   Diagnosis Date Noted  . 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 Encounter for therapeutic drug  monitoring 06/09/2013  . Complete heart block ([place]) 06/09/2013  . Syncope 04/24/2013  . H/O   . PAROXYSMAL ATRIAL FIBRILLATION 02/02/2010  . 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 DEEP VENOUS THROMBOPHLEBITIS 09/17/2008  . HYPOTENSION, ORTHOSTATIC 09/17/2008  . SYNCOPE 09/17/2008  . CHEST PAIN 09/17/2008  . PPM-Medtronic 09/17/2008    Past Surgical History:  Procedure Laterality Date  . CARDIAC CATHETERIZATION    . CARDIAC PACEMAKER PLACEMENT    . PERMANENT PACEMAKER GENERATOR CHANGE N/A 06/12/2012   Procedure: PERMANENT PACEMAKER GENERATOR CHANGE;  Surgeon: Evans Lance, MD;  Location: [facility] CATH LAB;  Service: Cardiovascular;  Laterality: N/A;  . RETINAL DETACHMENT SURGERY      OB History    No data available       Home Medications    Prior to Admission medications   Medication Sig Start Date End Date Taking? Authorizing Provider  dofetilide (TIKOSYN) 250 MCG capsule Take 1 capsule (250 mcg total) by mouth 2 (two) times daily. 09/23/15  Yes Peter M [name], MD  fludrocortisone (FLORINEF) 0.1 MG tablet Take 2 tablets by mouth  twice a day 09/23/15  Yes Peter M [name], MD  potassium chloride SA (K-DUR,KLOR-CON) 20 MEQ tablet Take 2 tablets by mouth  daily 09/23/15  Yes Peter M [name], MD  vitamin B-12 (CYANOCOBALAMIN) 1000 MCG tablet Take 1,000 mcg  by mouth daily.   Yes Historical Provider, MD  warfarin (COUMADIN) 4 MG tablet Take 1 tablet by mouth  daily or as directed by  coumadin clinic Patient taking differently: Take 2-4 mg by mouth See admin instructions. Take 1 tablet on Monday, Wednesday and Friday then take 1/2 tablet all the other days 08/06/15  Yes Peter M [name], MD    Family History Family History  Problem Relation Age of Onset  . Heart disease Father   . Stroke Father   . Heart attack Father   . Heart disease Brother   . Heart disease Brother   . Hypertension Mother   . Diabetes Sister   . Heart attack Sister     Social History Social History    Substance Use Topics  . Smoking status: Never Smoker  . Smokeless tobacco: Never Used  . Alcohol use No     Allergies   Amiodarone; Amoxicillin; Aspirin; Codeine; Darvocet [propoxyphene n-acetaminophen]; Demerol; and Penicillins   Review of Systems Review of Systems  All other systems reviewed and are negative.    Physical Exam Updated Vital Signs BP 146/72   Pulse 67   Resp 21   SpO2 98%   Physical Exam  Constitutional: She is oriented to person, place, and time. She appears well-developed and well-nourished. No distress.  HENT:  Head: Normocephalic and atraumatic.  Eyes: EOM are normal.  Neck: Normal range of motion.  Cardiovascular: Normal rate, regular rhythm and normal heart sounds.   Pulmonary/Chest: Effort normal and breath sounds normal.  Abdominal: Soft. She exhibits no distension. There is no tenderness.  Musculoskeletal: Normal range of motion.  Neurological: She is alert and oriented to person, place, and time.  Skin: Skin is warm and dry.  Psychiatric: She has a normal mood and affect. Judgment normal.  Nursing note and vitals reviewed.    ED Treatments / Results  Labs (all labs ordered are listed, but only abnormal results are displayed) Labs Reviewed  BASIC METABOLIC PANEL - Abnormal; Notable for the following:       Result Value   BUN 23 (*)    Creatinine, Ser 1.24 (*)    Calcium 8.7 (*)    GFR calc non Af Amer 38 (*)    GFR calc Af Amer 45 (*)    All other components within normal limits  CBC - Abnormal; Notable for the following:    Platelets 139 (*)    All other components within normal limits  PROTIME-INR - Abnormal; Notable for the following:    Prothrombin Time 26.5 (*)    All other components within normal limits  I-STAT TROPOININ, ED    EKG  EKG Interpretation  Date/Time:  Sunday December 12 2015 19:52:54 EDT Ventricular Rate:  70 PR Interval:  146 QRS Duration: 64 QT Interval:  424 QTC Calculation: 457 R Axis:   35 Text  Interpretation:  Normal sinus rhythm Low voltage QRS Borderline ECG No significant change was found Confirmed by Avrey Flanagin  MD, Lennette Bihari ([id number]) on 12/12/2015 9:23:28 PM       Radiology Dg Chest 2 View  Result Date: 12/12/2015 CLINICAL DATA:  Initial evaluation for acute chest pain. EXAM: CHEST  2 VIEW COMPARISON:  Prior radiograph from 12/09/2013. FINDINGS: Left-sided pacemaker/AICD noted, stable. Mild cardiomegaly unchanged. Mediastinal silhouette within normal limits. Aortic atherosclerosis noted. Lungs normally inflated. Minimal atelectasis/scarring noted at the left lung base. No focal infiltrates. No pulmonary edema or pleural effusion. No pneumothorax. No acute osseous abnormality. IMPRESSION: No  active cardiopulmonary disease. Electronically Signed   By: Jeannine Boga M.D.   On: 12/12/2015 20:51    Procedures Procedures (including critical care time)  Medications Ordered in ED Medications - No data to display   Initial Impression / Assessment and Plan / ED Course  I have reviewed the triage vital signs and the nursing notes.  Pertinent labs & imaging results that were available during my care of the patient were reviewed by me and considered in my medical decision making (see chart for details).  Clinical Course    Heart catheterization 2012 demonstrates only 10% coronary artery disease and one vessel.  Otherwise no signs of coronary artery disease and her other vessels.  Medtronic pacemaker was interrogated and as I suspected there were multiple PVCs noted which is a new problem for her.  I've asked that she contact her cardiology team for follow-up and initial recommendation for medications.  I do not believe this is a presentation of acute coronary syndrome.  Doubt PE.  Overall well-appearing.  Discharge home in good condition.  She understands to return to the ER for new or worsening symptoms  Final Clinical Impressions(s) / ED Diagnoses   Final diagnoses:  Chest pain,  unspecified type  PVC's (premature ventricular contractions)    New Prescriptions New Prescriptions   No medications on file     Jola Schmidt, MD 12/13/15 [phone]

## 2015-12-12 NOTE — ED Notes (Signed)
EDP at bedside  

## 2015-12-12 NOTE — ED Triage Notes (Signed)
C/o pressure to center of chest with sob since 6pm last night.  States she had syncopal episode at church today and someone had to catch her when she was falling.  History of afib.

## 2015-12-13 ENCOUNTER — Telehealth: Payer: Self-pay | Admitting: Cardiology

## 2015-12-13 NOTE — Telephone Encounter (Signed)
She is OK to wait for scheduled visit. These are chronic and I would not resume atenolol yet.  Marlan Steward [name] MD, [facility]

## 2015-12-13 NOTE — Telephone Encounter (Signed)
Spoke with pt she states that she is still having PVC's and sometimes she feels weak and is shaking, ER told her to call and see if she should be seen before her scheduled appt 02-11-16 with Dr [name]. St states that ER MD wanted her to start the Atenolol again but she has S/e's and does not think that she should. Ok to wait until scheduled appt?

## 2015-12-13 NOTE — Telephone Encounter (Signed)
New Message  Pt voiced calling nurse to call about going to ED yesterday.  Please f/u with pt

## 2015-12-14 NOTE — Telephone Encounter (Signed)
Returned call to patient she stated she had episode of chest pain this past Sunday while in church.Stated this pain was different than chest pain in the past.Stated she went to [facility] ER.Stated she still feels chest pain at times,weak and shaky.Appointment scheduled with Almyra Deforest PA 12/20/15 at 10:30 am.Appointment offered sooner,but patient has to get a ride.Stated if she feels better this week she will call back and cancel appointment.

## 2015-12-20 ENCOUNTER — Ambulatory Visit: Payer: Medicare Other | Admitting: Physician Assistant

## 2016-01-10 ENCOUNTER — Other Ambulatory Visit: Payer: Self-pay | Admitting: Pharmacist Clinician (PhC)/ Clinical Pharmacy Specialist

## 2016-01-10 ENCOUNTER — Ambulatory Visit (INDEPENDENT_AMBULATORY_CARE_PROVIDER_SITE_OTHER): Payer: Medicare Other | Admitting: Pharmacist Clinician (PhC)/ Clinical Pharmacy Specialist

## 2016-01-10 DIAGNOSIS — I4891 Unspecified atrial fibrillation: Secondary | ICD-10-CM | POA: Diagnosis not present

## 2016-01-10 DIAGNOSIS — Z5181 Encounter for therapeutic drug level monitoring: Secondary | ICD-10-CM

## 2016-01-10 LAB — POCT INR: INR: 2.7

## 2016-01-10 MED ORDER — POTASSIUM CHLORIDE CRYS ER 20 MEQ PO TBCR: 40.0000 meq | EXTENDED_RELEASE_TABLET | Freq: Every day | ORAL | 1 refills | Status: DC

## 2016-01-10 MED ORDER — FLUDROCORTISONE ACETATE 0.1 MG PO TABS: 200.0000 ug | ORAL_TABLET | Freq: Two times a day (BID) | ORAL | 1 refills | Status: DC

## 2016-02-01 ENCOUNTER — Ambulatory Visit (INDEPENDENT_AMBULATORY_CARE_PROVIDER_SITE_OTHER): Payer: Medicare Other | Admitting: *Deleted

## 2016-02-01 DIAGNOSIS — I495 Sick sinus syndrome: Secondary | ICD-10-CM

## 2016-02-02 NOTE — Progress Notes (Signed)
Remote pacemaker transmission.   

## 2016-02-03 ENCOUNTER — Encounter: Payer: Self-pay | Admitting: Cardiology

## 2016-02-09 NOTE — Progress Notes (Signed)
Katrina Rodriguez Date of Birth: [birth year] Medical Record [id number]  History of Present Illness: [name] is seen today for follow up of syncope. She has a history of atrial fibrillation. She is on chronic Tikosyn therapy. She has had a previous atrial fibrillation ablation. She has a history of recurrent syncope. She was noted to have intermittent complete heart block. Her pacemaker was reprogrammed to DDD with AV search to minimize V pacing.  She has a history of chronic chest pain with normal coronary anatomy by cardiac catheterization. She had a normal nuclear stress test 2/13. She has known sensitivity with V pacing with chest pain.    She has lost much of her vision and is no longer able to drive. She still has spells where everything goes black. She has no injuries. She did have an episode yesterday with HA in the back and then she went out. States her BP monitor read 44 systolic.  She denies any chest pain.  Current Outpatient Prescriptions on File Prior to Visit  Medication Sig Dispense Refill  . dofetilide (TIKOSYN) 250 MCG capsule Take 1 capsule (250 mcg total) by mouth 2 (two) times daily. 180 capsule 1  . fludrocortisone (FLORINEF) 0.1 MG tablet Take 2 tablets (200 mcg total) by mouth 2 (two) times daily. 360 tablet 1  . potassium chloride SA (K-DUR,KLOR-CON) 20 MEQ tablet Take 2 tablets (40 mEq total) by mouth daily. 180 tablet 1  . vitamin B-12 (CYANOCOBALAMIN) 1000 MCG tablet Take 1,000 mcg by mouth daily.    Marland Kitchen warfarin (COUMADIN) 4 MG tablet Take 1 tablet by mouth  daily or as directed by  coumadin clinic (Patient taking differently: Take 2-4 mg by mouth See admin instructions. Take 1 tablet on Monday, Wednesday and Friday then take 1/2 tablet all the other days) 90 tablet 1   No current facility-administered medications on file prior to visit.     Allergies  Allergen Reactions  . Amiodarone Other (See Comments)    Caused thyroid problems  . Amoxicillin Hives  . Aspirin Other  (See Comments)    On tikosyn and warfarin  . Codeine Other (See Comments)    Causes BP to drop  . Darvocet [Propoxyphene N-Acetaminophen] Hives  . Demerol Itching  . Penicillins Hives    Past Medical History:  Diagnosis Date  . Anticoagulant long-term use   . Arthritis   . Atrial fibrillation (HCC)    History of ablation - Dr. Rayann Heman  . Chest pain    Has normal coronaries per cath in 2005; negative nuclear in 2010  . Chronic anemia   . H/O syncope   . H/O: CVA (cardiovascular accident)    Right internal capsule  . High risk medication use    Tikosyn & Coumadin  . Hyperthyroidism   . Mild pulmonary hypertension   . 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 Orthostasis   . Pacemaker   . Retinal detachment   . Tachycardia-bradycardia syndrome ([place])   . Thrombocytopenia ([place])     Past Surgical History:  Procedure Laterality Date  . CARDIAC CATHETERIZATION    . CARDIAC PACEMAKER PLACEMENT    . PERMANENT PACEMAKER GENERATOR CHANGE N/A 06/12/2012   Procedure: PERMANENT PACEMAKER GENERATOR CHANGE;  Surgeon: Evans Lance, MD;  Location: [facility] CATH LAB;  Service: Cardiovascular;  Laterality: N/A;  . RETINAL DETACHMENT SURGERY      History  Smoking Status  . Never Smoker  Smokeless Tobacco  . Never Used  History  Alcohol Use No    Family History  Problem Relation Age of Onset  . Heart disease Father   . Stroke Father   . Heart attack Father   . Heart disease Brother   . Heart disease Brother   . Hypertension Mother   . Diabetes Sister   . Heart attack Sister     Review of Systems: The review of systems is positive for  vision loss.  All other systems were reviewed and are negative.  Physical Exam: BP 128/72   Pulse 64   Ht 5\' 5"  (1.651 m)   Wt 106 lb 6.4 oz (48.3 kg)   BMI 17.71 kg/m  Patient is very pleasant and in no acute distress. Skin is warm and dry. Color is normal.  HEENT is unremarkable. Normocephalic/atraumatic. PERRL. Sclera are nonicteric.  Neck is supple. No masses. No JVD. Lungs are clear. Her pacemaker site is OK. Cardiac exam shows a regular rate and rhythm. No murmur or S3. Abdomen is soft. Extremities are without edema. He left ankle is larger than the right. Gait and ROM are intact but she is a little unsteady. No gross neurologic deficits noted.   LABORATORY DATA:  Lab Results  Component Value Date   WBC 5.8 12/12/2015   HGB 13.5 12/12/2015   HCT 41.3 12/12/2015   PLT 139 (L) 12/12/2015   GLUCOSE 98 12/12/2015   CHOL 217 (H) 07/30/2014   TRIG 142 07/30/2014   HDL 53 07/30/2014   LDLCALC 136 (H) 07/30/2014   ALT 8 07/30/2014   AST 19 07/30/2014   NA 142 12/12/2015   K 3.8 12/12/2015   CL 108 12/12/2015   CREATININE 1.24 (H) 12/12/2015   BUN 23 (H) 12/12/2015   CO2 27 12/12/2015   TSH 1.04 09/16/2012   INR 2.7 01/10/2016   HGBA1C 5.8 (H) 06/12/2012     Assessment / Plan: 1. Atrial fibrillation. She appears to be maintaining sinus rhythm while on Tikosyn. Pacer check done remotely on 02/01/16 still pending. Status post ablation. With prior CVA she needs to remain on anticoagulation. Currently on Coumadin. INR 2.4 today.She was unable to afford the novel anticoagulants. She needs to stay on Tikosyn long term. Will follow up in 4 months.  2. Chronic chest pain. Ischemic evaluation has been negative. Some chest pain related to V pacing.   3. History of CVA. On chronic coumadin.  4. Sick sinus syndrome and CHB status post pacemaker implant.  Patient has regular followup in pacemaker clinic.  5. Recurrent syncope, multifactorial. She is on chronic Florinef. Continue to liberalize salt and wear support hose. I have little else to offer.

## 2016-02-11 ENCOUNTER — Ambulatory Visit: Payer: Medicare Other | Admitting: Cardiology

## 2016-02-11 ENCOUNTER — Ambulatory Visit (INDEPENDENT_AMBULATORY_CARE_PROVIDER_SITE_OTHER): Payer: Medicare Other | Admitting: Cardiology

## 2016-02-11 ENCOUNTER — Ambulatory Visit (INDEPENDENT_AMBULATORY_CARE_PROVIDER_SITE_OTHER): Payer: Medicare Other | Admitting: Pharmacist

## 2016-02-11 ENCOUNTER — Encounter: Payer: Self-pay | Admitting: Cardiology

## 2016-02-11 VITALS — BP 128/72 | HR 64 | Ht 65.0 in | Wt 106.4 lb

## 2016-02-11 DIAGNOSIS — I495 Sick sinus syndrome: Secondary | ICD-10-CM

## 2016-02-11 DIAGNOSIS — I442 Atrioventricular block, complete: Secondary | ICD-10-CM

## 2016-02-11 DIAGNOSIS — I4891 Unspecified atrial fibrillation: Secondary | ICD-10-CM | POA: Diagnosis not present

## 2016-02-11 DIAGNOSIS — R55 Syncope and collapse: Secondary | ICD-10-CM

## 2016-02-11 DIAGNOSIS — I951 Orthostatic hypotension: Secondary | ICD-10-CM | POA: Diagnosis not present

## 2016-02-11 DIAGNOSIS — I480 Paroxysmal atrial fibrillation: Secondary | ICD-10-CM | POA: Diagnosis not present

## 2016-02-11 DIAGNOSIS — Z5181 Encounter for therapeutic drug level monitoring: Secondary | ICD-10-CM

## 2016-02-11 LAB — POCT INR: INR: 3.5

## 2016-02-11 NOTE — Patient Instructions (Signed)
Continue your current therapy  I will see you in 6  Months   

## 2016-02-21 LAB — CUP PACEART REMOTE DEVICE CHECK
Battery Voltage: 2.78 V
Brady Statistic AP VS Percent: 58 %
Brady Statistic AS VS Percent: 41 %
Date Time Interrogation Session: 20171128114419
Implantable Lead Implant Date: 20040309
Implantable Lead Location: 753859
Lead Channel Impedance Value: 621 Ohm
Lead Channel Pacing Threshold Pulse Width: 0.4 ms
Lead Channel Pacing Threshold Pulse Width: 0.4 ms
Lead Channel Setting Sensing Sensitivity: 5.6 mV
MDC IDC LEAD IMPLANT DT: 20040309
MDC IDC LEAD LOCATION: 753860
MDC IDC LEAD SERIAL: 304362
MDC IDC MSMT BATTERY IMPEDANCE: 318 Ohm
MDC IDC MSMT BATTERY REMAINING LONGEVITY: 110 mo
MDC IDC MSMT LEADCHNL RA IMPEDANCE VALUE: 1093 Ohm
MDC IDC MSMT LEADCHNL RA PACING THRESHOLD AMPLITUDE: 0.75 V
MDC IDC MSMT LEADCHNL RV PACING THRESHOLD AMPLITUDE: 0.375 V
MDC IDC PG IMPLANT DT: 20140409
MDC IDC SET LEADCHNL RA PACING AMPLITUDE: 2 V
MDC IDC SET LEADCHNL RV PACING AMPLITUDE: 2.5 V
MDC IDC SET LEADCHNL RV PACING PULSEWIDTH: 0.4 ms
MDC IDC STAT BRADY AP VP PERCENT: 0 %
MDC IDC STAT BRADY AS VP PERCENT: 0 %

## 2016-03-13 ENCOUNTER — Ambulatory Visit (INDEPENDENT_AMBULATORY_CARE_PROVIDER_SITE_OTHER): Payer: Medicare Other | Admitting: Pharmacist Clinician (PhC)/ Clinical Pharmacy Specialist

## 2016-03-13 DIAGNOSIS — Z5181 Encounter for therapeutic drug level monitoring: Secondary | ICD-10-CM

## 2016-03-13 LAB — POCT INR: INR: 3.1

## 2016-04-03 ENCOUNTER — Ambulatory Visit (INDEPENDENT_AMBULATORY_CARE_PROVIDER_SITE_OTHER): Payer: Medicare Other | Admitting: Pharmacist Clinician (PhC)/ Clinical Pharmacy Specialist

## 2016-04-03 DIAGNOSIS — I4891 Unspecified atrial fibrillation: Secondary | ICD-10-CM | POA: Diagnosis not present

## 2016-04-03 DIAGNOSIS — Z5181 Encounter for therapeutic drug level monitoring: Secondary | ICD-10-CM | POA: Diagnosis not present

## 2016-04-03 LAB — POCT INR
INR: 1.6
INR: 1.6

## 2016-04-12 ENCOUNTER — Other Ambulatory Visit: Payer: Self-pay | Admitting: Cardiology

## 2016-04-17 ENCOUNTER — Ambulatory Visit (INDEPENDENT_AMBULATORY_CARE_PROVIDER_SITE_OTHER): Payer: Medicare Other | Admitting: Pharmacist

## 2016-04-17 DIAGNOSIS — I4891 Unspecified atrial fibrillation: Secondary | ICD-10-CM

## 2016-04-17 DIAGNOSIS — Z5181 Encounter for therapeutic drug level monitoring: Secondary | ICD-10-CM

## 2016-04-17 LAB — POCT INR: INR: 1.9

## 2016-05-02 ENCOUNTER — Ambulatory Visit (INDEPENDENT_AMBULATORY_CARE_PROVIDER_SITE_OTHER): Payer: Medicare Other | Admitting: *Deleted

## 2016-05-02 DIAGNOSIS — I495 Sick sinus syndrome: Secondary | ICD-10-CM

## 2016-05-02 NOTE — Progress Notes (Signed)
Remote pacemaker transmission.   

## 2016-05-03 ENCOUNTER — Encounter: Payer: Self-pay | Admitting: Cardiology

## 2016-05-04 LAB — CUP PACEART REMOTE DEVICE CHECK
Battery Impedance: 368 Ohm
Brady Statistic AP VP Percent: 0 %
Brady Statistic AP VS Percent: 56 %
Brady Statistic AS VS Percent: 44 %
Date Time Interrogation Session: 20180227131427
Implantable Lead Implant Date: 20040309
Implantable Lead Model: 4458
Lead Channel Impedance Value: 1067 Ohm
Lead Channel Impedance Value: 617 Ohm
Lead Channel Pacing Threshold Amplitude: 0.375 V
Lead Channel Pacing Threshold Pulse Width: 0.4 ms
Lead Channel Setting Pacing Amplitude: 2.5 V
Lead Channel Setting Sensing Sensitivity: 4 mV
MDC IDC LEAD IMPLANT DT: 20040309
MDC IDC LEAD LOCATION: 753859
MDC IDC LEAD LOCATION: 753860
MDC IDC LEAD SERIAL: 304362
MDC IDC MSMT BATTERY REMAINING LONGEVITY: 106 mo
MDC IDC MSMT BATTERY VOLTAGE: 2.78 V
MDC IDC MSMT LEADCHNL RA PACING THRESHOLD AMPLITUDE: 0.625 V
MDC IDC MSMT LEADCHNL RV PACING THRESHOLD PULSEWIDTH: 0.4 ms
MDC IDC PG IMPLANT DT: 20140409
MDC IDC SET LEADCHNL RA PACING AMPLITUDE: 2 V
MDC IDC SET LEADCHNL RV PACING PULSEWIDTH: 0.4 ms
MDC IDC STAT BRADY AS VP PERCENT: 0 %

## 2016-05-15 ENCOUNTER — Ambulatory Visit (INDEPENDENT_AMBULATORY_CARE_PROVIDER_SITE_OTHER): Payer: Medicare Other | Admitting: Pharmacist Clinician (PhC)/ Clinical Pharmacy Specialist

## 2016-05-15 DIAGNOSIS — Z5181 Encounter for therapeutic drug level monitoring: Secondary | ICD-10-CM

## 2016-05-15 DIAGNOSIS — Z961 Presence of intraocular lens: Secondary | ICD-10-CM | POA: Diagnosis not present

## 2016-05-15 DIAGNOSIS — I4891 Unspecified atrial fibrillation: Secondary | ICD-10-CM

## 2016-05-15 DIAGNOSIS — H1851 Endothelial corneal dystrophy: Secondary | ICD-10-CM | POA: Diagnosis not present

## 2016-05-15 DIAGNOSIS — D3131 Benign neoplasm of right choroid: Secondary | ICD-10-CM | POA: Diagnosis not present

## 2016-05-15 DIAGNOSIS — H35372 Puckering of macula, left eye: Secondary | ICD-10-CM | POA: Diagnosis not present

## 2016-05-15 DIAGNOSIS — H31092 Other chorioretinal scars, left eye: Secondary | ICD-10-CM | POA: Diagnosis not present

## 2016-05-15 LAB — POCT INR: INR: 1.8

## 2016-06-05 ENCOUNTER — Ambulatory Visit (INDEPENDENT_AMBULATORY_CARE_PROVIDER_SITE_OTHER): Payer: Medicare Other | Admitting: Pharmacist Clinician (PhC)/ Clinical Pharmacy Specialist

## 2016-06-05 DIAGNOSIS — Z5181 Encounter for therapeutic drug level monitoring: Secondary | ICD-10-CM | POA: Diagnosis not present

## 2016-06-05 DIAGNOSIS — I4891 Unspecified atrial fibrillation: Secondary | ICD-10-CM

## 2016-06-05 LAB — POCT INR: INR: 2.3

## 2016-06-12 ENCOUNTER — Encounter (INDEPENDENT_AMBULATORY_CARE_PROVIDER_SITE_OTHER): Payer: Medicare Other | Admitting: Ophthalmology

## 2016-06-12 DIAGNOSIS — H43813 Vitreous degeneration, bilateral: Secondary | ICD-10-CM

## 2016-06-12 DIAGNOSIS — H338 Other retinal detachments: Secondary | ICD-10-CM | POA: Diagnosis not present

## 2016-06-12 DIAGNOSIS — H1851 Endothelial corneal dystrophy: Secondary | ICD-10-CM | POA: Diagnosis not present

## 2016-06-12 DIAGNOSIS — D3131 Benign neoplasm of right choroid: Secondary | ICD-10-CM | POA: Diagnosis not present

## 2016-06-12 DIAGNOSIS — H353111 Nonexudative age-related macular degeneration, right eye, early dry stage: Secondary | ICD-10-CM

## 2016-07-03 ENCOUNTER — Ambulatory Visit (INDEPENDENT_AMBULATORY_CARE_PROVIDER_SITE_OTHER): Payer: Medicare Other | Admitting: Pharmacist

## 2016-07-03 DIAGNOSIS — I4891 Unspecified atrial fibrillation: Secondary | ICD-10-CM | POA: Diagnosis not present

## 2016-07-03 DIAGNOSIS — Z5181 Encounter for therapeutic drug level monitoring: Secondary | ICD-10-CM | POA: Diagnosis not present

## 2016-07-03 LAB — POCT INR: INR: 2.2

## 2016-07-13 ENCOUNTER — Encounter: Payer: Self-pay | Admitting: *Deleted

## 2016-07-19 NOTE — Progress Notes (Signed)
Electrophysiology Office Note Date: 07/20/2016  ID:  Katrina Rodriguez, Katrina Rodriguez Feb 18, 1929, MRN [id number]  PCP: Carol Ada, MD Primary Cardiologist: [name]: Allred  CC: Pacemaker follow-up  Katrina Rodriguez is a 83 y.o. female seen today for Dr Rayann Heman.  She presents today for routine electrophysiology followup.  Since last being seen in our clinic, the patient reports doing reasonably well.  She has trouble with her vision and continue to have orthostatic intolerance.  She denies chest pain, palpitations, dyspnea, PND, orthopnea, nausea, vomiting, syncope, edema, weight gain, or early satiety.  Device History: MDT dual chamber PPM implanted 2004 for symptomatic bradycardia, gen change 2014   Past Medical History:  Diagnosis Date  . Anticoagulant long-term use   . Arthritis   . Atrial fibrillation (HCC)    History of ablation - Dr. Rayann Heman  . Chest pain    Has normal coronaries per cath in 2005; negative nuclear in 2010  . Chronic anemia   . H/O syncope   . H/O: CVA (cardiovascular accident)    Right internal capsule  . High risk medication use    Tikosyn & Coumadin  . Hyperthyroidism   . Mild pulmonary hypertension ([place])   . 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 Orthostasis   . Pacemaker   . Retinal detachment   . Tachycardia-bradycardia syndrome ([place])   . Thrombocytopenia ([place])    Past Surgical History:  Procedure Laterality Date  . CARDIAC CATHETERIZATION    . CARDIAC PACEMAKER PLACEMENT    . PERMANENT PACEMAKER GENERATOR CHANGE N/A 06/12/2012   Procedure: PERMANENT PACEMAKER GENERATOR CHANGE;  Surgeon: Evans Lance, MD;  Location[facility] C[facility] LAB;  Service: Cardiovascular;  Laterality: N/A;  . RETINAL DETACHMENT SURGERY      Current Outpatient Prescriptions  Medication Sig Dispense Refill  . dofetilide (TIKOSYN) 250 MCG capsule TAKE 1 CAPSULE BY MOUTH TWO TIMES DAILY 180 capsule 3  . fludrocortisone (FLORINEF) 0.1 MG tablet Take 2 tablets (200 mcg  total) by mouth 2 (two) times daily. 360 tablet 1  . potassium chloride SA (K-DUR,KLOR-CON) 20 MEQ tablet Take 2 tablets (40 mEq total) by mouth daily. 180 tablet 1  . vitamin B-12 (CYANOCOBALAMIN) 1000 MCG tablet Take 1,000 mcg by mouth daily.    Marland Kitchen warfarin (COUMADIN) 4 MG tablet TAKE 1 TABLET BY MOUTH  DAILY OR AS DIRECTED BY  COUMADIN CLINIC 90 tablet 1   No current facility-administered medications for this visit.     Allergies:   Amiodarone; Amoxicillin; Aspirin; Codeine; Darvocet [propoxyphene n-acetaminophen]; Demerol; and Penicillins   Social History: Social History   Social History  . Marital status: Widowed    Spouse name: N/A  . Number of children: 2  . Years of education: 10   Occupational History  . Homemaker Retired   Social History Main Topics  . Smoking status: Never Smoker  . Smokeless tobacco: Never Used  . Alcohol use No  . Drug use: No  . Sexual activity: No   Other Topics Concern  . Not on file   Social History Narrative   Lives at home alone.   Caffeine use: Drinks coffee daily       Family History: Family History  Problem Relation Age of Onset  . Heart disease Father   . Stroke Father   . Heart attack Father   . Heart disease Brother   . Heart disease Brother   . Hypertension Mother   . Diabetes Sister   . Heart  attack Sister      Review of Systems: All other systems reviewed and are otherwise negative except as noted above.   Physical Exam: VS:  BP (!) 150/70   Pulse 65   Ht 5\' 5"  (1.651 m)   Wt 114 lb 8 oz (51.9 kg)   SpO2 96%   BMI 19.05 kg/m  , BMI Body mass index is 19.05 kg/m.  GEN- The patient is elderly and frail appearing, alert and oriented x 3 today.   HEENT: normocephalic, atraumatic; sclera clear, conjunctiva pink; hearing intact; oropharynx clear; neck supple  Lungs- Clear to ausculation bilaterally, normal work of breathing.  No wheezes, rales, rhonchi Heart- Regular rate and rhythm, no murmurs, rubs or gallops   GI- soft, non-tender, non-distended, bowel sounds present  Extremities- no clubbing, cyanosis, or edema; DP/PT/radial pulses 2+ bilaterally MS- no significant deformity or atrophy Skin- warm and dry, no rash or lesion; PPM pocket well healed Psych- euthymic mood, full affect Neuro- strength and sensation are intact  PPM Interrogation- reviewed in detail today,  See PACEART report  EKG:  EKG is ordered today. EKG today demonstrates sinus rhythm, QTc stable   Recent Labs: 07/30/2015: Magnesium 2.4 12/12/2015: BUN 23; Creatinine, Ser 1.24; Hemoglobin 13.5; Platelets 139; Potassium 3.8; Sodium 142   Wt Readings from Last 3 Encounters:  07/20/16 114 lb 8 oz (51.9 kg)  02/11/16 106 lb 6.4 oz (48.3 kg)  07/30/15 114 lb 6.4 oz (51.9 kg)     Other studies Reviewed: Additional studies/ records that were reviewed today include: Dr [name] and Dr Jackalyn Lombard office notes  Assessment and Plan:  1.  Symptomatic bradycardia Normal PPM function See Pace Art report No changes today  2.  Persistent atrial fibrillation Burden by device interrogation today <0.1% V rates controlled Continue Warfarin for CHADS2VASC of 5.  Continue tikosyn. QTc stable by EKG. BMET, Mg today   3.  Orthostatic intolerance/pre-syncope Continue Florinef   Current medicines are reviewed at length with the patient today.   The patient does not have concerns regarding her medicines.  The following changes were made today:  none  Labs/ tests ordered today include: BMET, Mg  Disposition:   Follow up with [facility] transmissions, Dr [name] as scheduled, me in 1 year      Signed, Chanetta Marshall, NP 07/20/2016 11:03 AM  [facility] [address] [place] [address] [phone] (office) [phone] (fax)

## 2016-07-20 ENCOUNTER — Encounter: Payer: Self-pay | Admitting: Nurse Practitioner

## 2016-07-20 ENCOUNTER — Ambulatory Visit (INDEPENDENT_AMBULATORY_CARE_PROVIDER_SITE_OTHER): Payer: Medicare Other | Admitting: Nurse Practitioner

## 2016-07-20 VITALS — BP 150/70 | HR 65 | Ht 65.0 in | Wt 114.5 lb

## 2016-07-20 DIAGNOSIS — I951 Orthostatic hypotension: Secondary | ICD-10-CM

## 2016-07-20 DIAGNOSIS — R001 Bradycardia, unspecified: Secondary | ICD-10-CM | POA: Diagnosis not present

## 2016-07-20 DIAGNOSIS — I480 Paroxysmal atrial fibrillation: Secondary | ICD-10-CM

## 2016-07-20 LAB — CUP PACEART INCLINIC DEVICE CHECK
Implantable Lead Implant Date: 20040309
Implantable Lead Implant Date: 20040309
Implantable Lead Location: 753860
Implantable Lead Model: 4458
Implantable Lead Serial Number: 304362
MDC IDC LEAD LOCATION: 753859
MDC IDC PG IMPLANT DT: 20140409
MDC IDC SESS DTM: 20180517103822

## 2016-07-20 LAB — BASIC METABOLIC PANEL
BUN/Creatinine Ratio: 17 (ref 12–28)
BUN: 20 mg/dL (ref 8–27)
CO2: 22 mmol/L (ref 18–29)
CREATININE: 1.16 mg/dL — AB (ref 0.57–1.00)
Calcium: 8.4 mg/dL — ABNORMAL LOW (ref 8.7–10.3)
Chloride: 102 mmol/L (ref 96–106)
GFR, EST AFRICAN AMERICAN: 49 mL/min/{1.73_m2} — AB (ref >59)
GFR, EST NON AFRICAN AMERICAN: 43 mL/min/{1.73_m2} — AB (ref >59)
Glucose: 88 mg/dL (ref 65–99)
POTASSIUM: 3.9 mmol/L (ref 3.5–5.2)
SODIUM: 143 mmol/L (ref 134–144)

## 2016-07-20 LAB — MAGNESIUM: Magnesium: 2.4 mg/dL — ABNORMAL HIGH (ref 1.6–2.3)

## 2016-07-20 NOTE — Patient Instructions (Signed)
Medication Instructions:  Your physician recommends that you continue on your current medications as directed. Please refer to the Current Medication list given to you today.   Labwork: Your physician recommends that you return for lab work today for BMET, MAGNESIUM  Testing/Procedures: None Ordered   Follow-Up: Remote monitoring is used to monitor your Pacemaker from home. This monitoring reduces the number of office visits required to check your device to one time per year. It allows [place] to keep an eye on the functioning of your device to ensure it is working properly. You are scheduled for a device check from home on 10/19/16. You may send your transmission at any time that day. If you have a wireless device, the transmission will be sent automatically. After your physician reviews your transmission, you will receive a postcard with your next transmission date.   Your physician wants you to follow-up in: 1 year with Chanetta Marshall, NP. You will receive a reminder letter in the mail two months in advance. If you don't receive a letter, please call our office to schedule the follow-up appointment.   Any Other Special Instructions Will Be Listed Below (If Applicable).     If you need a refill on your cardiac medications before your next appointment, please call your pharmacy.  Thank you for choosing [facility]

## 2016-08-05 NOTE — Progress Notes (Signed)
hs   Katrina Rodriguez Date of Birth: Aug 10, 1928 Medical Record #[id number]  History of Present Illness: Katrina Rodriguez is seen today for follow up of syncope. She has a history of atrial fibrillation. She is on chronic Tikosyn therapy. She has had a previous atrial fibrillation ablation. She has a history of recurrent syncope. She was noted to have intermittent complete heart block. Her pacemaker was reprogrammed to DDD with AV search to minimize V pacing.  She has a history of chronic chest pain with normal coronary anatomy by cardiac catheterization. She had a normal nuclear stress test 2/13. She has known sensitivity with V pacing with chest pain.   Seen recently in device clinic May 27. Afib burden <0.1%.    She states she is going blind and there is nothing can be done. Still lives by herself in a townhome.  She reports she has done very well with the blacking out spells until this past week when she had a day where her BP dropped to 66/41. She did not pass out. She has no injuries.   She denies any chest pain.  Current Outpatient Prescriptions on File Prior to Visit  Medication Sig Dispense Refill  . dofetilide (TIKOSYN) 250 MCG capsule TAKE 1 CAPSULE BY MOUTH TWO TIMES DAILY 180 capsule 3  . fludrocortisone (FLORINEF) 0.1 MG tablet Take 2 tablets (200 mcg total) by mouth 2 (two) times daily. 360 tablet 1  . potassium chloride SA (K-DUR,KLOR-CON) 20 MEQ tablet Take 2 tablets (40 mEq total) by mouth daily. 180 tablet 1  . vitamin B-12 (CYANOCOBALAMIN) 1000 MCG tablet Take 1,000 mcg by mouth daily.    Marland Kitchen warfarin (COUMADIN) 4 MG tablet TAKE 1 TABLET BY MOUTH  DAILY OR AS DIRECTED BY  COUMADIN CLINIC 90 tablet 1   No current facility-administered medications on file prior to visit.     Allergies  Allergen Reactions  . Amiodarone Other (See Comments)    Caused thyroid problems  . Amoxicillin Hives  . Aspirin Other (See Comments)    On tikosyn and warfarin  . Codeine Other (See Comments)    Causes BP  to drop  . Darvocet [Propoxyphene N-Acetaminophen] Hives  . Demerol Itching  . Penicillins Hives    Past Medical History:  Diagnosis Date  . Anticoagulant long-term use   . Arthritis   . Atrial fibrillation (HCC)    History of ablation - Dr. Rayann Heman  . Chest pain    Has normal coronaries per cath in 2005; negative nuclear in 2010  . Chronic anemia   . H/O syncope   . H/O: CVA (cardiovascular accident)    Right internal capsule  . High risk medication use    Tikosyn & Coumadin  . Hyperthyroidism   . Mild pulmonary hypertension ([place])   . 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 Orthostasis   . Pacemaker   . Retinal detachment   . Tachycardia-bradycardia syndrome ([place])   . Thrombocytopenia ([place])     Past Surgical History:  Procedure Laterality Date  . CARDIAC CATHETERIZATION    . CARDIAC PACEMAKER PLACEMENT    . PERMANENT PACEMAKER GENERATOR CHANGE N/A 06/12/2012   Procedure: PERMANENT PACEMAKER GENERATOR CHANGE;  Surgeon: Evans Lance, MD;  Location: [facility] CATH LAB;  Service: Cardiovascular;  Laterality: N/A;  . RETINAL DETACHMENT SURGERY      History  Smoking Status  . Never Smoker  Smokeless Tobacco  . Never Used    History  Alcohol Use No  Family History  Problem Relation Age of Onset  . Heart disease Father   . Stroke Father   . Heart attack Father   . Heart disease Brother   . Heart disease Brother   . Hypertension Mother   . Diabetes Sister   . Heart attack Sister     Review of Systems: The review of systems is positive for  vision loss.  All other systems were reviewed and are negative.  Physical Exam: BP (!) 153/73   Pulse 62   Ht 5\' 5"  (1.651 m)   Wt 114 lb 9.6 oz (52 kg)   BMI 19.07 kg/m  Patient is very pleasant and in no acute distress. Skin is warm and dry. Color is normal.  HEENT is unremarkable. Normocephalic/atraumatic. PERRL. Sclera are nonicteric. Neck is supple. No masses. No JVD. Lungs are clear. Her pacemaker site is OK. Cardiac  exam shows a regular rate and rhythm. No murmur or S3. Abdomen is soft. Extremities are without edema. He left ankle is larger than the right. Gait and ROM are intact but she is a little unsteady. No gross neurologic deficits noted.   LABORATORY DATA:  Lab Results  Component Value Date   WBC 5.8 12/12/2015   HGB 13.5 12/12/2015   HCT 41.3 12/12/2015   PLT 139 (L) 12/12/2015   GLUCOSE 88 07/20/2016   CHOL 217 (H) 07/30/2014   TRIG 142 07/30/2014   HDL 53 07/30/2014   LDLCALC 136 (H) 07/30/2014   ALT 8 07/30/2014   AST 19 07/30/2014   NA 143 07/20/2016   K 3.9 07/20/2016   CL 102 07/20/2016   CREATININE 1.16 (H) 07/20/2016   BUN 20 07/20/2016   CO2 22 07/20/2016   TSH 1.04 09/16/2012   INR 2.2 08/07/2016   HGBA1C 5.8 (H) 06/12/2012     Assessment / Plan: 1. Atrial fibrillation. She appears to be maintaining sinus rhythm while on Tikosyn. Status post ablation. With prior CVA she needs to remain on anticoagulation. Currently on Coumadin. INR 2.2 today.She was unable to afford the newer anticoagulants. She needs to stay on Tikosyn long term. Will follow up in 6 months.  2. Chronic chest pain. Ischemic evaluation has been negative. Some chest pain related to V pacing.   3. History of CVA. On chronic coumadin.  4. Sick sinus syndrome and CHB status post pacemaker implant.  Patient has regular followup in pacemaker clinic.  5. Recurrent syncope, multifactorial. She is on chronic Florinef. Continue to liberalize salt and wear support hose. There is  little else to offer.

## 2016-08-07 ENCOUNTER — Encounter: Payer: Self-pay | Admitting: Cardiology

## 2016-08-07 ENCOUNTER — Ambulatory Visit (INDEPENDENT_AMBULATORY_CARE_PROVIDER_SITE_OTHER): Payer: Medicare Other | Admitting: Pharmacist Clinician (PhC)/ Clinical Pharmacy Specialist

## 2016-08-07 ENCOUNTER — Ambulatory Visit (INDEPENDENT_AMBULATORY_CARE_PROVIDER_SITE_OTHER): Payer: Medicare Other | Admitting: Cardiology

## 2016-08-07 VITALS — BP 153/73 | HR 62 | Ht 65.0 in | Wt 114.6 lb

## 2016-08-07 DIAGNOSIS — R55 Syncope and collapse: Secondary | ICD-10-CM

## 2016-08-07 DIAGNOSIS — I4891 Unspecified atrial fibrillation: Secondary | ICD-10-CM | POA: Diagnosis not present

## 2016-08-07 DIAGNOSIS — Z5181 Encounter for therapeutic drug level monitoring: Secondary | ICD-10-CM

## 2016-08-07 DIAGNOSIS — I951 Orthostatic hypotension: Secondary | ICD-10-CM

## 2016-08-07 DIAGNOSIS — I480 Paroxysmal atrial fibrillation: Secondary | ICD-10-CM | POA: Diagnosis not present

## 2016-08-07 LAB — POCT INR: INR: 2.2

## 2016-08-07 NOTE — Patient Instructions (Signed)
Continue your current therapy  I will see you in 6 months.   

## 2016-09-04 ENCOUNTER — Ambulatory Visit (INDEPENDENT_AMBULATORY_CARE_PROVIDER_SITE_OTHER): Payer: Medicare Other | Admitting: Pharmacist

## 2016-09-04 DIAGNOSIS — Z5181 Encounter for therapeutic drug level monitoring: Secondary | ICD-10-CM | POA: Diagnosis not present

## 2016-09-04 DIAGNOSIS — I4891 Unspecified atrial fibrillation: Secondary | ICD-10-CM | POA: Diagnosis not present

## 2016-09-04 LAB — POCT INR: INR: 3.1

## 2016-09-18 ENCOUNTER — Other Ambulatory Visit: Payer: Self-pay | Admitting: Cardiology

## 2016-10-04 ENCOUNTER — Ambulatory Visit (INDEPENDENT_AMBULATORY_CARE_PROVIDER_SITE_OTHER): Payer: Medicare Other | Admitting: Pharmacist Clinician (PhC)/ Clinical Pharmacy Specialist

## 2016-10-04 DIAGNOSIS — I4891 Unspecified atrial fibrillation: Secondary | ICD-10-CM | POA: Diagnosis not present

## 2016-10-04 DIAGNOSIS — Z5181 Encounter for therapeutic drug level monitoring: Secondary | ICD-10-CM

## 2016-10-04 LAB — POCT INR: INR: 3.2

## 2016-10-19 ENCOUNTER — Encounter: Payer: Medicare Other | Admitting: *Deleted

## 2016-10-23 ENCOUNTER — Ambulatory Visit (INDEPENDENT_AMBULATORY_CARE_PROVIDER_SITE_OTHER): Payer: Medicare Other | Admitting: Pharmacist

## 2016-10-23 DIAGNOSIS — I4891 Unspecified atrial fibrillation: Secondary | ICD-10-CM

## 2016-10-23 DIAGNOSIS — Z5181 Encounter for therapeutic drug level monitoring: Secondary | ICD-10-CM | POA: Diagnosis not present

## 2016-10-23 LAB — POCT INR: INR: 2.8

## 2016-10-25 ENCOUNTER — Encounter: Payer: Self-pay | Admitting: Cardiology

## 2016-10-30 ENCOUNTER — Ambulatory Visit (INDEPENDENT_AMBULATORY_CARE_PROVIDER_SITE_OTHER): Payer: Medicare Other | Admitting: *Deleted

## 2016-10-30 ENCOUNTER — Telehealth: Payer: Self-pay | Admitting: Internal Medicine

## 2016-10-30 DIAGNOSIS — I495 Sick sinus syndrome: Secondary | ICD-10-CM | POA: Diagnosis not present

## 2016-10-30 NOTE — Telephone Encounter (Signed)
New message    Pt is calling asking for a call back about her transmission. She said she sent one on the 16th, but received a letter that it was not received. Please call.

## 2016-10-30 NOTE — Telephone Encounter (Signed)
Attempted to help pt send in transmission, transmission received.

## 2016-10-31 LAB — CUP PACEART REMOTE DEVICE CHECK
Battery Impedance: 442 Ohm
Brady Statistic AP VP Percent: 0 %
Brady Statistic AP VS Percent: 59 %
Brady Statistic AS VP Percent: 0 %
Brady Statistic AS VS Percent: 41 %
Implantable Lead Implant Date: 20040309
Implantable Lead Location: 753860
Implantable Lead Model: 4458
Implantable Lead Serial Number: 304362
Lead Channel Impedance Value: 1041 Ohm
Lead Channel Impedance Value: 573 Ohm
Lead Channel Pacing Threshold Amplitude: 0.75 V
Lead Channel Pacing Threshold Pulse Width: 0.4 ms
Lead Channel Setting Pacing Amplitude: 2.5 V
MDC IDC LEAD IMPLANT DT: 20040309
MDC IDC LEAD LOCATION: 753859
MDC IDC MSMT BATTERY REMAINING LONGEVITY: 98 mo
MDC IDC MSMT BATTERY VOLTAGE: 2.78 V
MDC IDC MSMT LEADCHNL RV PACING THRESHOLD AMPLITUDE: 0.375 V
MDC IDC MSMT LEADCHNL RV PACING THRESHOLD PULSEWIDTH: 0.4 ms
MDC IDC PG IMPLANT DT: 20140409
MDC IDC SESS DTM: 20180827191508
MDC IDC SET LEADCHNL RA PACING AMPLITUDE: 2 V
MDC IDC SET LEADCHNL RV PACING PULSEWIDTH: 0.4 ms
MDC IDC SET LEADCHNL RV SENSING SENSITIVITY: 5.6 mV

## 2016-10-31 NOTE — Progress Notes (Signed)
Remote pacemaker transmission.   

## 2016-11-08 DIAGNOSIS — H01024 Squamous blepharitis left upper eyelid: Secondary | ICD-10-CM | POA: Diagnosis not present

## 2016-11-08 DIAGNOSIS — H01025 Squamous blepharitis left lower eyelid: Secondary | ICD-10-CM | POA: Diagnosis not present

## 2016-11-08 DIAGNOSIS — H01022 Squamous blepharitis right lower eyelid: Secondary | ICD-10-CM | POA: Diagnosis not present

## 2016-11-08 DIAGNOSIS — Z961 Presence of intraocular lens: Secondary | ICD-10-CM | POA: Diagnosis not present

## 2016-11-08 DIAGNOSIS — H01021 Squamous blepharitis right upper eyelid: Secondary | ICD-10-CM | POA: Diagnosis not present

## 2016-11-08 DIAGNOSIS — H04123 Dry eye syndrome of bilateral lacrimal glands: Secondary | ICD-10-CM | POA: Diagnosis not present

## 2016-11-08 DIAGNOSIS — H1851 Endothelial corneal dystrophy: Secondary | ICD-10-CM | POA: Diagnosis not present

## 2016-11-10 ENCOUNTER — Encounter: Payer: Self-pay | Admitting: Cardiology

## 2016-11-20 ENCOUNTER — Ambulatory Visit (INDEPENDENT_AMBULATORY_CARE_PROVIDER_SITE_OTHER): Payer: Medicare Other | Admitting: Pharmacist

## 2016-11-20 DIAGNOSIS — Z7901 Long term (current) use of anticoagulants: Secondary | ICD-10-CM

## 2016-11-20 DIAGNOSIS — I4891 Unspecified atrial fibrillation: Secondary | ICD-10-CM | POA: Diagnosis not present

## 2016-11-20 DIAGNOSIS — Z5181 Encounter for therapeutic drug level monitoring: Secondary | ICD-10-CM

## 2016-11-20 LAB — POCT INR: INR: 2.3

## 2016-12-14 ENCOUNTER — Ambulatory Visit (INDEPENDENT_AMBULATORY_CARE_PROVIDER_SITE_OTHER): Payer: Medicare Other | Admitting: Neurology

## 2016-12-14 ENCOUNTER — Encounter: Payer: Self-pay | Admitting: Neurology

## 2016-12-14 VITALS — BP 151/77 | HR 64 | Ht 65.0 in | Wt 113.5 lb

## 2016-12-14 DIAGNOSIS — E538 Deficiency of other specified B group vitamins: Secondary | ICD-10-CM

## 2016-12-14 DIAGNOSIS — H4720 Unspecified optic atrophy: Secondary | ICD-10-CM

## 2016-12-14 NOTE — Progress Notes (Signed)
Reason for visit: Right optic atrophy  Referring physician: Dr. [name] Wagman is a 83 y.o. female  History of present illness:  Katrina Rodriguez is an 83 year old right-handed white female with a history of a progressive issue with visual loss from the right eye that began around 2013. The patient indicates that the changes have been gradual over time. She has significant impairment in vision in the left eye secondary to multiple retinal detachments. The patient has been noted to have optic atrophy on the right, she has been seen by Dr. Sanda Klein from [facility], I do not have any records of this evaluation. The patient has a pacemaker in, she cannot have MRI of the brain. She has had a carotid Doppler study 2 years ago that was unremarkable. The patient has a history of cerebrovascular disease, she has had a right brain stroke in the past with a left hemiparesis. She will have some issues at times with the left leg giving out when she walks. She denies any history of headache or dizziness. She has not had any new numbness or weakness on the arms or legs, she does have some slight gait instability, she uses a cane for ambulation. She does occasionally have some low back pain. She is almost blind. She does not drive a car, she lives alone but she has people who help her frequently to get around. She is sent to this office for further evaluation. She was previously noted to have a low B12 level, she has been on vitamin B12 supplementation, but the vision continues to worsen. The patient has no definite family history of progressive visual loss.  Past Medical History:  Diagnosis Date  . Anticoagulant long-term use   . Arthritis   . Atrial fibrillation (HCC)    History of ablation - Dr. Rayann Heman  . Chest pain    Has normal coronaries per cath in 2005; negative nuclear in 2010  . Chronic anemia   . H/O syncope   . H/O: CVA (cardiovascular accident)    Right internal capsule  . High risk medication use     Tikosyn & Coumadin  . Hyperthyroidism   . Mild pulmonary hypertension ([place])   . 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 Orthostasis   . Pacemaker   . Retinal detachment   . Tachycardia-bradycardia syndrome ([place])   . Thrombocytopenia ([place])     Past Surgical History:  Procedure Laterality Date  . CARDIAC CATHETERIZATION    . CARDIAC PACEMAKER PLACEMENT    . PERMANENT PACEMAKER GENERATOR CHANGE N/A 06/12/2012   Procedure: PERMANENT PACEMAKER GENERATOR CHANGE;  Surgeon: Evans Lance, MD;  Location: [facility] CATH LAB;  Service: Cardiovascular;  Laterality: N/A;  . RETINAL DETACHMENT SURGERY      Family History  Problem Relation Age of Onset  . Heart disease Father   . Stroke Father   . Heart attack Father   . Heart disease Brother   . Heart disease Brother   . Hypertension Mother   . Diabetes Sister   . Heart attack Sister     Social history:  reports that she has never smoked. She has never used smokeless tobacco. She reports that she does not drink alcohol or use drugs.  Medications:  Prior to Admission medications   Medication Sig Start Date End Date Taking? Authorizing Provider  dofetilide (TIKOSYN) 250 MCG capsule TAKE 1 CAPSULE BY MOUTH TWO TIMES DAILY 04/13/16  Yes [name], Peter M, MD  fludrocortisone (  FLORINEF) 0.1 MG tablet TAKE 2 TABLETS BY MOUTH TWO TIMES DAILY 09/18/16  Yes [name], Peter M, MD  potassium chloride SA (K-DUR,KLOR-CON) 20 MEQ tablet Take 2 tablets (40 mEq total) by mouth daily. 01/10/16  Yes [name], Peter M, MD  vitamin B-12 (CYANOCOBALAMIN) 1000 MCG tablet Take 1,000 mcg by mouth daily.   Yes [provider]  warfarin (COUMADIN) 4 MG tablet Take 1/2 to 1 tablet by mouth  daily or as directed by  coumadin clinic 09/18/16  Yes [name], Peter M, MD  prednisoLONE acetate (PRED FORTE) 1 % ophthalmic suspension PLACE 1 DROP IN BOTH EYES 4 TIMES DAILY 11/08/16   [provider]      Allergies  Allergen Reactions  . Amiodarone Other (See Comments)     Caused thyroid problems  . Amoxicillin Hives  . Aspirin Other (See Comments)    On tikosyn and warfarin  . Codeine Other (See Comments)    Causes BP to drop  . Darvocet [Propoxyphene N-Acetaminophen] Hives  . Demerol Itching  . Penicillins Hives    ROS:  Out of a complete 14 system review of symptoms, the patient complains only of the following symptoms, and all other reviewed systems are negative.  Hearing loss Eye itching, eye redness, light sensitivity, loss of vision, eye pain Constipation Dizziness, weakness, passing out Visual hallucinations  Blood pressure (!) 151/77, pulse 64, height 5\' 5"  (1.651 m), weight 113 lb 8 oz (51.5 kg).  Physical Exam  General: The patient is alert and cooperative at the time of the examination.  Eyes: Pupils are equal, round, and reactive to light. Discs are flat bilaterally.  Neck: The neck is supple, no carotid bruits are noted.  Respiratory: The respiratory examination is clear.  Cardiovascular: The cardiovascular examination reveals a regular rate and rhythm, no obvious murmurs or rubs are noted.  Skin: Extremities are without significant edema.  Neurologic Exam  Mental status: The patient is alert and oriented x 3 at the time of the examination. The patient has apparent normal recent and remote memory, with an apparently normal attention span and concentration ability.  Cranial nerves: Facial symmetry is present. There is good sensation of the face to pinprick and soft touch on the right, decreased on the left. The strength of the facial muscles and the muscles to head turning and shoulder shrug are normal bilaterally. Speech is well enunciated, no aphasia or dysarthria is noted. Extraocular movements are full, but on primary gaze there is exotropia of the left eye. Visual fields are quite impaired, the patient has difficulty with counting fingers or even localizing a hand in front of her face. The tongue is midline, and the  patient has symmetric elevation of the soft palate. No obvious hearing deficits are noted.  Motor: The motor testing reveals 5 over 5 strength of the right extremities. The patient has 4/5 strength the left arm, slight weakness with the left leg as well. Good symmetric motor tone is noted throughout.  Sensory: Sensory testing is intact to pinprick, soft touch, vibration sensation, and position sense on the right extremities. The patient reports some decrease in pinprick and vibration sensation on the left arm, some decreased pinprick sensation on the left leg, vibration sensation is more symmetric. No evidence of extinction is noted.  Coordination: Cerebellar testing reveals good finger-nose-finger and heel-to-shin bilaterally.  Gait and station: Gait is wide-based, slightly unsteady. Tandem gait was not attempted. Romberg is negative. No drift is seen.  Reflexes: Deep tendon reflexes are  symmetric and normal bilaterally. Toes are downgoing bilaterally.    CT head 02/01/15:  IMPRESSION:  This CT scan of the head without contrast shows the following: 1.   Mild age related atrophy and chronic microvascular ischemic changes that are essentially unchanged when compared to the 12/09/2013 CT scan. 2.   The right orbit appears normal. The left globe has increased attenuation consistent with prosthesis or prior surgical changes. 3.   There are no acute findings.  * CT scan images were reviewed online. I agree with the written report.    Assessment/Plan:  1. Optic atrophy, OD  2. History of atrial fibrillation  3. Prior right brain stroke, left hemiparesis  4. Gait disorder  5. History of vitamin B12 deficiency  The patient has continued have gradual loss of vision involving the right eye. The patient has a prior history a vitamin B12 deficiency, she is on medication for this. She will be sent for further blood work, we will repeat a CT scan of the brain with without contrast. The patient  may not have a treatable etiology of visual loss, she is almost blind currently.  Jill Alexanders MD 12/14/2016 9:52 AM  [facility] [address] [place], [place] [address]  Phone [phone] Fax [phone]

## 2016-12-14 NOTE — Patient Instructions (Signed)
   We will get blood work today and get CT of the Brain

## 2016-12-18 ENCOUNTER — Telehealth: Payer: Self-pay | Admitting: *Deleted

## 2016-12-18 ENCOUNTER — Ambulatory Visit (INDEPENDENT_AMBULATORY_CARE_PROVIDER_SITE_OTHER): Payer: Medicare Other | Admitting: Pharmacist

## 2016-12-18 DIAGNOSIS — Z7901 Long term (current) use of anticoagulants: Secondary | ICD-10-CM | POA: Diagnosis not present

## 2016-12-18 DIAGNOSIS — I4891 Unspecified atrial fibrillation: Secondary | ICD-10-CM | POA: Diagnosis not present

## 2016-12-18 DIAGNOSIS — Z5181 Encounter for therapeutic drug level monitoring: Secondary | ICD-10-CM

## 2016-12-18 LAB — PAN-ANCA
ANCA Proteinase 3: <3.5 U/mL (ref 0.0–3.5)
C-ANCA: <1:20 {titer}
P-ANCA: <1:20 {titer}

## 2016-12-18 LAB — COMPREHENSIVE METABOLIC PANEL
A/G RATIO: 2.2 (ref 1.2–2.2)
ALK PHOS: 64 IU/L (ref 39–117)
ALT: 7 IU/L (ref 0–32)
AST: 20 IU/L (ref 0–40)
Albumin: 4.4 g/dL (ref 3.5–4.7)
BUN/Creatinine Ratio: 15 (ref 12–28)
BUN: 19 mg/dL (ref 8–27)
Bilirubin Total: 0.7 mg/dL (ref 0.0–1.2)
CO2: 23 mmol/L (ref 20–29)
CREATININE: 1.24 mg/dL — AB (ref 0.57–1.00)
Calcium: 8.9 mg/dL (ref 8.7–10.3)
Chloride: 105 mmol/L (ref 96–106)
GFR calc Af Amer: 45 mL/min/{1.73_m2} — ABNORMAL LOW (ref >59)
GFR calc non Af Amer: 39 mL/min/{1.73_m2} — ABNORMAL LOW (ref >59)
GLOBULIN, TOTAL: 2 g/dL (ref 1.5–4.5)
Glucose: 82 mg/dL (ref 65–99)
Potassium: 4 mmol/L (ref 3.5–5.2)
SODIUM: 146 mmol/L — AB (ref 134–144)
Total Protein: 6.4 g/dL (ref 6.0–8.5)

## 2016-12-18 LAB — VITAMIN B12: Vitamin B-12: 347 pg/mL (ref 232–1245)

## 2016-12-18 LAB — ANA W/REFLEX: ANA: NEGATIVE

## 2016-12-18 LAB — RPR: RPR: NONREACTIVE

## 2016-12-18 LAB — ANGIOTENSIN CONVERTING ENZYME: Angio Convert Enzyme: 58 U/L (ref 14–82)

## 2016-12-18 LAB — NEUROMYELITIS OPTICA AUTOAB, IGG

## 2016-12-18 LAB — SEDIMENTATION RATE: Sed Rate: 2 mm/hr (ref 0–40)

## 2016-12-18 LAB — POCT INR: INR: 2.3

## 2016-12-18 LAB — COPPER, SERUM: Copper: 90 ug/dL (ref 72–166)

## 2016-12-18 LAB — B. BURGDORFI ANTIBODIES: Lyme IgG/IgM Ab: <0.91 {ISR} (ref 0.00–0.90)

## 2016-12-18 NOTE — Telephone Encounter (Signed)
Called and spoke with patient about lab results per CW,MD note. She verbalized understanding and appreciation for call. Advised her to call back if she has further questions/concerns.

## 2016-12-18 NOTE — Telephone Encounter (Signed)
-----   Message from Kathrynn Ducking, MD sent at 12/18/2016  3:26 PM EDT -----  Blood work is unremarkable with exception of chronic stable renal insufficiency, minimally elevated sodium level is noted, not likely to be clinically significant. Otherwise, all blood work was unremarkable. Please call the patient. ----- Message ----- From: Lavone Neri Lab Results In Sent: 12/15/2016   7:43 AM To: Kathrynn Ducking, MD

## 2016-12-25 ENCOUNTER — Telehealth: Payer: Self-pay | Admitting: Neurology

## 2016-12-25 ENCOUNTER — Ambulatory Visit
Admission: RE | Admit: 2016-12-25 | Discharge: 2016-12-25 | Disposition: A | Discharge status: Home / Self Care | Payer: Medicare Other | Source: Ambulatory Visit | Attending: Neurology | Admitting: Neurology

## 2016-12-25 ENCOUNTER — Other Ambulatory Visit: Payer: Medicare Other

## 2016-12-25 DIAGNOSIS — H1859 Other hereditary corneal dystrophies: Secondary | ICD-10-CM | POA: Diagnosis not present

## 2016-12-25 DIAGNOSIS — H01024 Squamous blepharitis left upper eyelid: Secondary | ICD-10-CM | POA: Diagnosis not present

## 2016-12-25 DIAGNOSIS — H4720 Unspecified optic atrophy: Secondary | ICD-10-CM | POA: Diagnosis not present

## 2016-12-25 DIAGNOSIS — H1851 Endothelial corneal dystrophy: Secondary | ICD-10-CM | POA: Diagnosis not present

## 2016-12-25 DIAGNOSIS — H01021 Squamous blepharitis right upper eyelid: Secondary | ICD-10-CM | POA: Diagnosis not present

## 2016-12-25 DIAGNOSIS — H01025 Squamous blepharitis left lower eyelid: Secondary | ICD-10-CM | POA: Diagnosis not present

## 2016-12-25 DIAGNOSIS — H53132 Sudden visual loss, left eye: Secondary | ICD-10-CM | POA: Diagnosis not present

## 2016-12-25 DIAGNOSIS — H47213 Primary optic atrophy, bilateral: Secondary | ICD-10-CM | POA: Diagnosis not present

## 2016-12-25 DIAGNOSIS — H01022 Squamous blepharitis right lower eyelid: Secondary | ICD-10-CM | POA: Diagnosis not present

## 2016-12-25 DIAGNOSIS — E538 Deficiency of other specified B group vitamins: Secondary | ICD-10-CM

## 2016-12-25 MED ORDER — IOPAMIDOL (ISOVUE-300) INJECTION 61%
75.0000 mL | Freq: Once | INTRAVENOUS | Status: AC | PRN
  Administered 2016-12-25: 75 mL via INTRAVENOUS

## 2016-12-25 NOTE — Telephone Encounter (Signed)
I called the patient. CT of the brain is unchanged from 2016. No evidence of compression of the optic nerve on the right.  The patient has had blood work done as well, no etiology for the progressive blindness has been identified.   CT head 12/25/16:  IMPRESSION:  This CT scan of the head with and without contrast shows the following: 1.    Mild generalized cortical atrophy and mild chronic microvascular ischemic changes, stable in appearance compared to the 11th 20 2016 CT scan. 2.    The brain has a normal enhancement pattern after contrast is administered. 3.    Chronic increased attenuation within the left globe, unchanged in appearance from the previous CT scan.  The right globe and orbit appear normal.

## 2016-12-25 NOTE — Telephone Encounter (Signed)
Dr. Patrice Paradise called from ophthalmology. She was wondering what the results of the workup were, why the patient has optic atrophy on the right. She is almost blind at this point.  CT of the head was done today, this appears to show no obvious intracranial abnormality, the formal reading is still pending.  Blood work done was unremarkable, the etiology of this problem is not clear. The patient is unable to have MRI of the brain as she has a cardiac pacemaker in place.  She has seen Dr. Jobe Igo from neuro-ophthalmology previously, the etiology of her optic atrophy was never delineated.

## 2017-01-01 ENCOUNTER — Ambulatory Visit: Payer: Medicare Other | Admitting: Neurology

## 2017-01-15 ENCOUNTER — Ambulatory Visit (INDEPENDENT_AMBULATORY_CARE_PROVIDER_SITE_OTHER): Payer: Medicare Other | Admitting: Pharmacist Clinician (PhC)/ Clinical Pharmacy Specialist

## 2017-01-15 DIAGNOSIS — Z5181 Encounter for therapeutic drug level monitoring: Secondary | ICD-10-CM | POA: Diagnosis not present

## 2017-01-15 DIAGNOSIS — I4891 Unspecified atrial fibrillation: Secondary | ICD-10-CM

## 2017-01-15 LAB — POCT INR: INR: 2

## 2017-01-29 ENCOUNTER — Ambulatory Visit (INDEPENDENT_AMBULATORY_CARE_PROVIDER_SITE_OTHER): Payer: Medicare Other | Admitting: *Deleted

## 2017-01-29 DIAGNOSIS — I495 Sick sinus syndrome: Secondary | ICD-10-CM

## 2017-01-30 NOTE — Progress Notes (Signed)
Remote pacemaker transmission.   

## 2017-02-01 LAB — CUP PACEART REMOTE DEVICE CHECK
Battery Impedance: 492 Ohm
Brady Statistic AP VP Percent: 0 %
Brady Statistic AS VP Percent: 0 %
Brady Statistic AS VS Percent: 40 %
Implantable Lead Implant Date: 20040309
Implantable Lead Implant Date: 20040309
Implantable Lead Location: 753859
Implantable Lead Location: 753860
Implantable Lead Model: 4458
Implantable Lead Serial Number: 304362
Implantable Pulse Generator Implant Date: 20140409
Lead Channel Pacing Threshold Amplitude: 0.375 V
Lead Channel Pacing Threshold Amplitude: 0.75 V
Lead Channel Pacing Threshold Pulse Width: 0.4 ms
Lead Channel Sensing Intrinsic Amplitude: 11.2 mV
MDC IDC MSMT BATTERY REMAINING LONGEVITY: 94 mo
MDC IDC MSMT BATTERY VOLTAGE: 2.78 V
MDC IDC MSMT LEADCHNL RA IMPEDANCE VALUE: 1093 Ohm
MDC IDC MSMT LEADCHNL RV IMPEDANCE VALUE: 643 Ohm
MDC IDC MSMT LEADCHNL RV PACING THRESHOLD PULSEWIDTH: 0.4 ms
MDC IDC SESS DTM: 20181126130413
MDC IDC SET LEADCHNL RA PACING AMPLITUDE: 2 V
MDC IDC SET LEADCHNL RV PACING AMPLITUDE: 2.5 V
MDC IDC SET LEADCHNL RV PACING PULSEWIDTH: 0.4 ms
MDC IDC SET LEADCHNL RV SENSING SENSITIVITY: 4 mV
MDC IDC STAT BRADY AP VS PERCENT: 60 %

## 2017-02-02 ENCOUNTER — Encounter: Payer: Self-pay | Admitting: Cardiology

## 2017-02-12 ENCOUNTER — Ambulatory Visit: Payer: Medicare Other | Admitting: Cardiology

## 2017-02-19 ENCOUNTER — Ambulatory Visit (INDEPENDENT_AMBULATORY_CARE_PROVIDER_SITE_OTHER): Payer: Medicare Other | Admitting: Pharmacist Clinician (PhC)/ Clinical Pharmacy Specialist

## 2017-02-19 DIAGNOSIS — I4891 Unspecified atrial fibrillation: Secondary | ICD-10-CM | POA: Diagnosis not present

## 2017-02-19 DIAGNOSIS — Z5181 Encounter for therapeutic drug level monitoring: Secondary | ICD-10-CM

## 2017-02-19 LAB — POCT INR: INR: 2.3

## 2017-02-19 NOTE — Patient Instructions (Signed)
Description   Continue 1 tablet each Monday and Friday, 1/2 tablet all other days, repeat INR in 5 weeks

## 2017-03-25 NOTE — Progress Notes (Signed)
hs   Katrina Rodriguez Date of Birth: [birth year] Medical Record #[id number]  History of Present Illness: Katrina Rodriguez is seen today for follow up of syncope. She has a history of atrial fibrillation. She is on chronic Tikosyn therapy. She has had a previous atrial fibrillation ablation. She has a history of recurrent syncope. She was noted to have intermittent complete heart block. Her pacemaker was reprogrammed to DDD with AV search to minimize V pacing.  She has a history of chronic chest pain with normal coronary anatomy by cardiac catheterization. She had a normal nuclear stress test 2/13. She has known sensitivity with V pacing with chest pain.   Pacemaker last checked November 26,2018. Afib burden <0.1%.    She states she is going blind and there is nothing can be done. She has had extensive evaluation by neurology including extensive blood work and CT head without significant finding. Still lives by herself in a townhome.  She reports this week she had a couple of episodes of blacking out. She feels a pain in the back of the head and it feels like her head is draining. She passed out once in church this weekend. She has no injuries.   She denies any chest pain.  Current Outpatient Medications on File Prior to Visit  Medication Sig Dispense Refill  . dofetilide (TIKOSYN) 250 MCG capsule TAKE 1 CAPSULE BY MOUTH TWO TIMES DAILY 180 capsule 3  . fludrocortisone (FLORINEF) 0.1 MG tablet TAKE 2 TABLETS BY MOUTH TWO TIMES DAILY 360 tablet 0  . potassium chloride SA (K-DUR,KLOR-CON) 20 MEQ tablet Take 2 tablets (40 mEq total) by mouth daily. 180 tablet 1  . prednisoLONE acetate (PRED FORTE) 1 % ophthalmic suspension PLACE 1 DROP IN BOTH EYES 4 TIMES DAILY  0  . vitamin B-12 (CYANOCOBALAMIN) 1000 MCG tablet Take 1,000 mcg by mouth daily.    Marland Kitchen warfarin (COUMADIN) 4 MG tablet Take 1/2 to 1 tablet by mouth  daily or as directed by  coumadin clinic 90 tablet 0   No current facility-administered medications on file  prior to visit.     Allergies  Allergen Reactions  . Amiodarone Other (See Comments)    Caused thyroid problems  . Amoxicillin Hives  . Aspirin Other (See Comments)    On tikosyn and warfarin  . Codeine Other (See Comments)    Causes BP to drop  . Darvocet [Propoxyphene N-Acetaminophen] Hives  . Demerol Itching  . Penicillins Hives    Past Medical History:  Diagnosis Date  . Anticoagulant long-term use   . Arthritis   . Atrial fibrillation (HCC)    History of ablation - Dr. Rayann Heman  . Chest pain    Has normal coronaries per cath in 2005; negative nuclear in 2010  . Chronic anemia   . H/O syncope   . H/O: CVA (cardiovascular accident)    Right internal capsule  . High risk medication use    Tikosyn & Coumadin  . Hyperthyroidism   . Mild pulmonary hypertension ([place])   . 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 Orthostasis   . Pacemaker   . Retinal detachment   . Tachycardia-bradycardia syndrome ([place])   . Thrombocytopenia ([place])     Past Surgical History:  Procedure Laterality Date  . CARDIAC CATHETERIZATION    . CARDIAC PACEMAKER PLACEMENT    . PERMANENT PACEMAKER GENERATOR CHANGE N/A 06/12/2012   Procedure: PERMANENT PACEMAKER GENERATOR CHANGE;  Surgeon: Evans Lance, MD;  Location: [facility] CATH LAB;  Service: Cardiovascular;  Laterality: N/A;  . RETINAL DETACHMENT SURGERY      Social History   Tobacco Use  Smoking Status Never Smoker  Smokeless Tobacco Never Used    Social History   Substance and Sexual Activity  Alcohol Use No    Family History  Problem Relation Age of Onset  . Heart disease Father   . Stroke Father   . Heart attack Father   . Heart disease Brother   . Heart disease Brother   . Hypertension Mother   . Diabetes Sister   . Heart attack Sister     Review of Systems: The review of systems is positive for  vision loss.  All other systems were reviewed and are negative.  Physical Exam: BP (!) 122/58   Pulse 88   Ht 5\' 4"  (1.626 m)    Wt 112 lb 4 oz (50.9 kg)   BMI 19.27 kg/m  GENERAL:  Well appearing, elderly WF in NAD.  HEENT:  PERRL, EOMI, sclera are clear. Oropharynx is clear. NECK:  No jugular venous distention, carotid upstroke brisk and symmetric, no bruits, no thyromegaly or adenopathy LUNGS:  Clear to auscultation bilaterally CHEST:  Unremarkable HEART:  RRR,  PMI not displaced or sustained,S1 and S2 within normal limits, no S3, no S4: no clicks, no rubs, no murmurs ABD:  Soft, nontender. BS +, no masses or bruits. No hepatomegaly, no splenomegaly EXT:  2 + pulses throughout, no edema, no cyanosis no clubbing SKIN:  Warm and dry.  No rashes NEURO:  Alert and oriented x 3. Cranial nerves II through XII intact. Walks with a cane. Poor vision. PSYCH:  Cognitively intact     LABORATORY DATA:  Lab Results  Component Value Date   WBC 5.8 12/12/2015   HGB 13.5 12/12/2015   HCT 41.3 12/12/2015   PLT 139 (L) 12/12/2015   GLUCOSE 82 12/14/2016   CHOL 217 (H) 07/30/2014   TRIG 142 07/30/2014   HDL 53 07/30/2014   LDLCALC 136 (H) 07/30/2014   ALT 7 12/14/2016   AST 20 12/14/2016   NA 146 (H) 12/14/2016   K 4.0 12/14/2016   CL 105 12/14/2016   CREATININE 1.24 (H) 12/14/2016   BUN 19 12/14/2016   CO2 23 12/14/2016   TSH 1.04 09/16/2012   INR 2.3 02/19/2017   HGBA1C 5.8 (H) 06/12/2012     Assessment / Plan: 1. Atrial fibrillation. She appears to be maintaining sinus rhythm well on Tikosyn. Status post ablation. With prior CVA she needs to remain on anticoagulation. Currently on Coumadin. INR therapeutic  today.She was unable to afford NOACs. She needs to stay on Tikosyn long term. Will follow up in 6 months.  2. Chronic chest pain. Ischemic evaluation has been negative. Some chest pain related to V pacing.   3. History of CVA. On chronic coumadin.  4. Sick sinus syndrome and CHB status post pacemaker implant.  Patient has regular followup in pacemaker clinic.  5. Recurrent syncope, multifactorial.  She is on chronic Florinef. Continue to liberalize salt and wear support hose. There is little else to offer. I am afraid with her vision loss she will become more dependent on others and this is her great fear.

## 2017-03-26 ENCOUNTER — Ambulatory Visit (INDEPENDENT_AMBULATORY_CARE_PROVIDER_SITE_OTHER): Payer: Medicare Other | Admitting: Pharmacist Clinician (PhC)/ Clinical Pharmacy Specialist

## 2017-03-26 ENCOUNTER — Encounter: Payer: Self-pay | Admitting: Cardiology

## 2017-03-26 ENCOUNTER — Ambulatory Visit (INDEPENDENT_AMBULATORY_CARE_PROVIDER_SITE_OTHER): Payer: Medicare Other | Admitting: Cardiology

## 2017-03-26 VITALS — BP 122/58 | HR 88 | Ht 64.0 in | Wt 112.2 lb

## 2017-03-26 DIAGNOSIS — Z5181 Encounter for therapeutic drug level monitoring: Secondary | ICD-10-CM

## 2017-03-26 DIAGNOSIS — I442 Atrioventricular block, complete: Secondary | ICD-10-CM

## 2017-03-26 DIAGNOSIS — R001 Bradycardia, unspecified: Secondary | ICD-10-CM | POA: Diagnosis not present

## 2017-03-26 DIAGNOSIS — R55 Syncope and collapse: Secondary | ICD-10-CM | POA: Diagnosis not present

## 2017-03-26 DIAGNOSIS — I4891 Unspecified atrial fibrillation: Secondary | ICD-10-CM

## 2017-03-26 LAB — POCT INR: INR: 2.8

## 2017-03-26 NOTE — Patient Instructions (Signed)
Continue your current therapy  I will see you in 6 months.   

## 2017-04-16 DIAGNOSIS — H1851 Endothelial corneal dystrophy: Secondary | ICD-10-CM | POA: Diagnosis not present

## 2017-04-16 DIAGNOSIS — H01022 Squamous blepharitis right lower eyelid: Secondary | ICD-10-CM | POA: Diagnosis not present

## 2017-04-16 DIAGNOSIS — H01021 Squamous blepharitis right upper eyelid: Secondary | ICD-10-CM | POA: Diagnosis not present

## 2017-04-16 DIAGNOSIS — H01025 Squamous blepharitis left lower eyelid: Secondary | ICD-10-CM | POA: Diagnosis not present

## 2017-04-16 DIAGNOSIS — H01024 Squamous blepharitis left upper eyelid: Secondary | ICD-10-CM | POA: Diagnosis not present

## 2017-04-16 DIAGNOSIS — H4720 Unspecified optic atrophy: Secondary | ICD-10-CM | POA: Diagnosis not present

## 2017-04-23 ENCOUNTER — Telehealth: Payer: Self-pay | Admitting: Cardiology

## 2017-04-23 MED ORDER — DOFETILIDE 250 MCG PO CAPS: 250.0000 ug | ORAL_CAPSULE | Freq: Two times a day (BID) | ORAL | 1 refills | Status: DC

## 2017-04-23 MED ORDER — FLUDROCORTISONE ACETATE 0.1 MG PO TABS: 0.1000 mg | ORAL_TABLET | Freq: Two times a day (BID) | ORAL | 1 refills | Status: DC

## 2017-04-23 MED ORDER — WARFARIN SODIUM 4 MG PO TABS: ORAL_TABLET | ORAL | 1 refills | Status: DC

## 2017-04-23 NOTE — Telephone Encounter (Signed)
New message      *STAT* If patient is at the pharmacy, call can be transferred to refill team.   1. Which medications need to be refilled? (please list name of each medication and dose if known) warfarin (COUMADIN) 4 MG tablet dofetilide (TIKOSYN) 250 MCG capsule  fludrocortisone (FLORINEF) 0.1 MG tablet  2. Which pharmacy/location (including street and city if local pharmacy) is medication to be sent to? [facility] [address]   3. Do they need a 30 day or 90 day supply? [place]

## 2017-04-23 NOTE — Addendum Note (Signed)
Addended by: Harrington Challenger on: 04/23/2017 04:48 PM   Modules accepted: Orders

## 2017-04-30 ENCOUNTER — Ambulatory Visit (INDEPENDENT_AMBULATORY_CARE_PROVIDER_SITE_OTHER): Payer: Medicare Other | Admitting: Pharmacist Clinician (PhC)/ Clinical Pharmacy Specialist

## 2017-04-30 ENCOUNTER — Ambulatory Visit (INDEPENDENT_AMBULATORY_CARE_PROVIDER_SITE_OTHER): Payer: Medicare Other | Admitting: *Deleted

## 2017-04-30 DIAGNOSIS — Z8673 Personal history of transient ischemic attack (TIA), and cerebral infarction without residual deficits: Secondary | ICD-10-CM

## 2017-04-30 DIAGNOSIS — I4891 Unspecified atrial fibrillation: Secondary | ICD-10-CM

## 2017-04-30 DIAGNOSIS — I442 Atrioventricular block, complete: Secondary | ICD-10-CM | POA: Diagnosis not present

## 2017-04-30 DIAGNOSIS — Z5181 Encounter for therapeutic drug level monitoring: Secondary | ICD-10-CM | POA: Diagnosis not present

## 2017-04-30 LAB — POCT INR: INR: 1.6

## 2017-04-30 NOTE — Patient Instructions (Signed)
Description   Take 2 tablets today Monday Feb 25, then continue 1 tablet each Monday and Friday, 1/2 tablet all other days, repeat INR in 2 weeks

## 2017-04-30 NOTE — Progress Notes (Signed)
Remote pacemaker transmission.   

## 2017-05-03 ENCOUNTER — Encounter: Payer: Self-pay | Admitting: Cardiology

## 2017-05-14 ENCOUNTER — Ambulatory Visit (INDEPENDENT_AMBULATORY_CARE_PROVIDER_SITE_OTHER): Payer: Medicare Other | Admitting: Pharmacist

## 2017-05-14 DIAGNOSIS — Z5181 Encounter for therapeutic drug level monitoring: Secondary | ICD-10-CM

## 2017-05-14 DIAGNOSIS — H47293 Other optic atrophy, bilateral: Secondary | ICD-10-CM | POA: Diagnosis not present

## 2017-05-14 DIAGNOSIS — H1851 Endothelial corneal dystrophy: Secondary | ICD-10-CM | POA: Diagnosis not present

## 2017-05-14 DIAGNOSIS — D485 Neoplasm of uncertain behavior of skin: Secondary | ICD-10-CM | POA: Diagnosis not present

## 2017-05-14 DIAGNOSIS — I4891 Unspecified atrial fibrillation: Secondary | ICD-10-CM

## 2017-05-14 LAB — POCT INR: INR: 2.8

## 2017-05-15 LAB — CUP PACEART REMOTE DEVICE CHECK
Battery Impedance: 618 Ohm
Battery Remaining Longevity: 84 mo
Battery Voltage: 2.78 V
Brady Statistic AS VP Percent: 0 %
Implantable Lead Implant Date: 20040309
Implantable Lead Location: 753859
Implantable Lead Location: 753860
Implantable Lead Model: 4458
Implantable Lead Serial Number: 304362
Implantable Pulse Generator Implant Date: 20140409
Lead Channel Impedance Value: 1093 Ohm
Lead Channel Pacing Threshold Amplitude: 0.75 V
Lead Channel Pacing Threshold Pulse Width: 0.4 ms
Lead Channel Setting Pacing Amplitude: 2 V
Lead Channel Setting Pacing Amplitude: 2.5 V
Lead Channel Setting Pacing Pulse Width: 0.4 ms
MDC IDC LEAD IMPLANT DT: 20040309
MDC IDC MSMT LEADCHNL RA PACING THRESHOLD PULSEWIDTH: 0.4 ms
MDC IDC MSMT LEADCHNL RV IMPEDANCE VALUE: 671 Ohm
MDC IDC MSMT LEADCHNL RV PACING THRESHOLD AMPLITUDE: 0.5 V
MDC IDC SESS DTM: 20190225133805
MDC IDC SET LEADCHNL RV SENSING SENSITIVITY: 5.6 mV
MDC IDC STAT BRADY AP VP PERCENT: 0 %
MDC IDC STAT BRADY AP VS PERCENT: 61 %
MDC IDC STAT BRADY AS VS PERCENT: 39 %

## 2017-05-22 DIAGNOSIS — N289 Disorder of kidney and ureter, unspecified: Secondary | ICD-10-CM | POA: Diagnosis not present

## 2017-06-13 ENCOUNTER — Ambulatory Visit (INDEPENDENT_AMBULATORY_CARE_PROVIDER_SITE_OTHER): Payer: Medicare Other | Admitting: Pharmacist

## 2017-06-13 DIAGNOSIS — I4891 Unspecified atrial fibrillation: Secondary | ICD-10-CM

## 2017-06-13 DIAGNOSIS — Z5181 Encounter for therapeutic drug level monitoring: Secondary | ICD-10-CM

## 2017-06-13 DIAGNOSIS — R944 Abnormal results of kidney function studies: Secondary | ICD-10-CM | POA: Diagnosis not present

## 2017-06-13 LAB — POCT INR: INR: 2.2

## 2017-06-19 ENCOUNTER — Emergency Department (HOSPITAL_COMMUNITY): Payer: Medicare Other

## 2017-06-19 ENCOUNTER — Inpatient Hospital Stay (HOSPITAL_COMMUNITY)
Admission: EM | Admit: 2017-06-19 | Discharge: 2017-06-23 | DRG: 068 | Disposition: A | Discharge status: Home Health | Payer: Medicare Other | Attending: Internal Medicine | Admitting: Internal Medicine

## 2017-06-19 ENCOUNTER — Encounter (HOSPITAL_COMMUNITY): Payer: Self-pay | Admitting: Emergency Medicine

## 2017-06-19 DIAGNOSIS — Z950 Presence of cardiac pacemaker: Secondary | ICD-10-CM

## 2017-06-19 DIAGNOSIS — R531 Weakness: Secondary | ICD-10-CM | POA: Diagnosis not present

## 2017-06-19 DIAGNOSIS — H5461 Unqualified visual loss, right eye, normal vision left eye: Secondary | ICD-10-CM | POA: Diagnosis present

## 2017-06-19 DIAGNOSIS — Z833 Family history of diabetes mellitus: Secondary | ICD-10-CM

## 2017-06-19 DIAGNOSIS — G459 Transient cerebral ischemic attack, unspecified: Secondary | ICD-10-CM | POA: Diagnosis not present

## 2017-06-19 DIAGNOSIS — E039 Hypothyroidism, unspecified: Secondary | ICD-10-CM | POA: Diagnosis not present

## 2017-06-19 DIAGNOSIS — E785 Hyperlipidemia, unspecified: Secondary | ICD-10-CM | POA: Diagnosis present

## 2017-06-19 DIAGNOSIS — Z886 Allergy status to analgesic agent status: Secondary | ICD-10-CM

## 2017-06-19 DIAGNOSIS — H5462 Unqualified visual loss, left eye, normal vision right eye: Secondary | ICD-10-CM | POA: Diagnosis present

## 2017-06-19 DIAGNOSIS — H4720 Unspecified optic atrophy: Secondary | ICD-10-CM | POA: Diagnosis present

## 2017-06-19 DIAGNOSIS — Z7982 Long term (current) use of aspirin: Secondary | ICD-10-CM

## 2017-06-19 DIAGNOSIS — I442 Atrioventricular block, complete: Secondary | ICD-10-CM | POA: Diagnosis present

## 2017-06-19 DIAGNOSIS — Z66 Do not resuscitate: Secondary | ICD-10-CM | POA: Diagnosis present

## 2017-06-19 DIAGNOSIS — I69354 Hemiplegia and hemiparesis following cerebral infarction affecting left non-dominant side: Secondary | ICD-10-CM

## 2017-06-19 DIAGNOSIS — Z880 Allergy status to penicillin: Secondary | ICD-10-CM

## 2017-06-19 DIAGNOSIS — I4891 Unspecified atrial fibrillation: Secondary | ICD-10-CM | POA: Diagnosis present

## 2017-06-19 DIAGNOSIS — I495 Sick sinus syndrome: Secondary | ICD-10-CM | POA: Diagnosis present

## 2017-06-19 DIAGNOSIS — Z885 Allergy status to narcotic agent status: Secondary | ICD-10-CM

## 2017-06-19 DIAGNOSIS — Z7901 Long term (current) use of anticoagulants: Secondary | ICD-10-CM

## 2017-06-19 DIAGNOSIS — R0789 Other chest pain: Secondary | ICD-10-CM | POA: Diagnosis present

## 2017-06-19 DIAGNOSIS — I480 Paroxysmal atrial fibrillation: Secondary | ICD-10-CM | POA: Diagnosis present

## 2017-06-19 DIAGNOSIS — I10 Essential (primary) hypertension: Secondary | ICD-10-CM | POA: Diagnosis present

## 2017-06-19 DIAGNOSIS — T148XXA Other injury of unspecified body region, initial encounter: Secondary | ICD-10-CM | POA: Diagnosis not present

## 2017-06-19 DIAGNOSIS — Z7952 Long term (current) use of systemic steroids: Secondary | ICD-10-CM

## 2017-06-19 DIAGNOSIS — Z8249 Family history of ischemic heart disease and other diseases of the circulatory system: Secondary | ICD-10-CM

## 2017-06-19 DIAGNOSIS — M79605 Pain in left leg: Secondary | ICD-10-CM | POA: Diagnosis not present

## 2017-06-19 DIAGNOSIS — Z823 Family history of stroke: Secondary | ICD-10-CM

## 2017-06-19 DIAGNOSIS — I669 Occlusion and stenosis of unspecified cerebral artery: Secondary | ICD-10-CM | POA: Diagnosis not present

## 2017-06-19 DIAGNOSIS — I951 Orthostatic hypotension: Secondary | ICD-10-CM | POA: Diagnosis present

## 2017-06-19 DIAGNOSIS — H469 Unspecified optic neuritis: Secondary | ICD-10-CM | POA: Diagnosis present

## 2017-06-19 DIAGNOSIS — R0602 Shortness of breath: Secondary | ICD-10-CM | POA: Diagnosis present

## 2017-06-19 DIAGNOSIS — D696 Thrombocytopenia, unspecified: Secondary | ICD-10-CM | POA: Diagnosis present

## 2017-06-19 DIAGNOSIS — M24572 Contracture, left ankle: Secondary | ICD-10-CM | POA: Diagnosis present

## 2017-06-19 HISTORY — DX: Weakness: R53.1

## 2017-06-19 HISTORY — DX: Hypothyroidism, unspecified: E03.9

## 2017-06-19 LAB — URINALYSIS, ROUTINE W REFLEX MICROSCOPIC
BILIRUBIN URINE: NEGATIVE
GLUCOSE, UA: NEGATIVE mg/dL
Ketones, ur: NEGATIVE mg/dL
LEUKOCYTES UA: NEGATIVE
NITRITE: NEGATIVE
PROTEIN: NEGATIVE mg/dL
Specific Gravity, Urine: 1.011 (ref 1.005–1.030)
pH: 5 (ref 5.0–8.0)

## 2017-06-19 LAB — BASIC METABOLIC PANEL
ANION GAP: 9 (ref 5–15)
BUN: 24 mg/dL — ABNORMAL HIGH (ref 6–20)
CALCIUM: 8.4 mg/dL — AB (ref 8.9–10.3)
CO2: 24 mmol/L (ref 22–32)
CREATININE: 1.09 mg/dL — AB (ref 0.44–1.00)
Chloride: 108 mmol/L (ref 101–111)
GFR, EST AFRICAN AMERICAN: 52 mL/min — AB (ref >60)
GFR, EST NON AFRICAN AMERICAN: 45 mL/min — AB (ref >60)
Glucose, Bld: 100 mg/dL — ABNORMAL HIGH (ref 65–99)
Potassium: 4 mmol/L (ref 3.5–5.1)
SODIUM: 141 mmol/L (ref 135–145)

## 2017-06-19 LAB — CBC
HCT: 40.2 % (ref 36.0–46.0)
HEMOGLOBIN: 13.3 g/dL (ref 12.0–15.0)
MCH: 31.7 pg (ref 26.0–34.0)
MCHC: 33.1 g/dL (ref 30.0–36.0)
MCV: 95.9 fL (ref 78.0–100.0)
Platelets: 120 10*3/uL — ABNORMAL LOW (ref 150–400)
RBC: 4.19 MIL/uL (ref 3.87–5.11)
RDW: 13.2 % (ref 11.5–15.5)
WBC: 4.8 10*3/uL (ref 4.0–10.5)

## 2017-06-19 LAB — PROTIME-INR
INR: 2.26
Prothrombin Time: 24.7 seconds — ABNORMAL HIGH (ref 11.4–15.2)

## 2017-06-19 LAB — I-STAT TROPONIN, ED: Troponin i, poc: 0 ng/mL (ref 0.00–0.08)

## 2017-06-19 MED ORDER — PREDNISOLONE ACETATE 1 % OP SUSP
1.0000 [drp] | Freq: Four times a day (QID) | OPHTHALMIC | Status: DC
  Administered 2017-06-20 – 2017-06-23 (×13): 1 [drp] via OPHTHALMIC
  Filled 2017-06-19: qty 1

## 2017-06-19 MED ORDER — POTASSIUM CHLORIDE CRYS ER 20 MEQ PO TBCR
40.0000 meq | EXTENDED_RELEASE_TABLET | Freq: Every day | ORAL | Status: DC
  Administered 2017-06-19 – 2017-06-23 (×5): 40 meq via ORAL
  Filled 2017-06-19 (×5): qty 2

## 2017-06-19 MED ORDER — ACETAMINOPHEN 650 MG RE SUPP: 650.0000 mg | Freq: Four times a day (QID) | RECTAL | Status: DC | PRN

## 2017-06-19 MED ORDER — ACETAMINOPHEN 325 MG PO TABS
650.0000 mg | ORAL_TABLET | Freq: Four times a day (QID) | ORAL | Status: DC | PRN
  Administered 2017-06-20 – 2017-06-22 (×2): 650 mg via ORAL
  Filled 2017-06-19 (×2): qty 2

## 2017-06-19 MED ORDER — WARFARIN - PHARMACIST DOSING INPATIENT
Freq: Every day | Status: DC
  Administered 2017-06-21: 18:00:00

## 2017-06-19 MED ORDER — WARFARIN SODIUM 4 MG PO TABS
4.0000 mg | ORAL_TABLET | Freq: Once | ORAL | Status: DC
  Filled 2017-06-19: qty 1

## 2017-06-19 MED ORDER — DOFETILIDE 250 MCG PO CAPS
250.0000 ug | ORAL_CAPSULE | Freq: Two times a day (BID) | ORAL | Status: DC
Start: 2017-06-19 — End: 2017-06-23
  Administered 2017-06-19 – 2017-06-23 (×8): 250 ug via ORAL
  Filled 2017-06-19 (×9): qty 1

## 2017-06-19 MED ORDER — FLUDROCORTISONE ACETATE 0.1 MG PO TABS
0.1000 mg | ORAL_TABLET | Freq: Two times a day (BID) | ORAL | Status: DC
  Administered 2017-06-19 – 2017-06-23 (×8): 0.1 mg via ORAL
  Filled 2017-06-19 (×9): qty 1

## 2017-06-19 MED ORDER — SODIUM CHLORIDE 0.9 % IV SOLN
INTRAVENOUS | Status: DC
  Administered 2017-06-19 – 2017-06-20 (×2): via INTRAVENOUS

## 2017-06-19 MED ORDER — ONDANSETRON HCL 4 MG PO TABS: 4.0000 mg | ORAL_TABLET | Freq: Four times a day (QID) | ORAL | Status: DC | PRN

## 2017-06-19 MED ORDER — POLYETHYLENE GLYCOL 3350 17 G PO PACK: 17.0000 g | PACK | Freq: Every day | ORAL | Status: DC | PRN

## 2017-06-19 MED ORDER — WARFARIN SODIUM 4 MG PO TABS: 4.0000 mg | ORAL_TABLET | ORAL | Status: DC

## 2017-06-19 MED ORDER — ONDANSETRON HCL 4 MG/2ML IJ SOLN: 4.0000 mg | Freq: Four times a day (QID) | INTRAMUSCULAR | Status: DC | PRN

## 2017-06-19 MED ORDER — ASPIRIN EC 81 MG PO TBEC
81.0000 mg | DELAYED_RELEASE_TABLET | Freq: Every day | ORAL | Status: DC
  Administered 2017-06-19 – 2017-06-22 (×4): 81 mg via ORAL
  Filled 2017-06-19 (×4): qty 1

## 2017-06-19 NOTE — H&P (Signed)
PCP:   Carol Ada, MD   Chief Complaint:  Left sided weakness  HPI: this is a 84y/o female who lives alone she has a h/o CVA with left sided stable weakness. she states she has been feeling weaker the last week or so but today she had definite worsened LUE and LLE weakness that has persisted all day. She denies HA and her blood pressure is normal. She reported some nausea but no vomiting.   She reports chest pressure but no palpitations. She had associated SOB. This has since resolved. It was centrally located witrh no radiation. She has a h/o afib and SSS. She has a PPM in place.her pressure at its worse was 8/10, currently 0/10. Patient was at rest.  She also reports orthostatic hypotension on florinef. She states she passed out frequently as a result. She passed out in church 2 sundays ago. Her daughter is actively searching for someone to stay at home with her as she lives alone.   Review of Systems:  The patient denies anorexia, fever, weight loss,, vision loss, decreased hearing, hoarseness, chest pressure, syncope, dyspnea on exertion, SOB, peripheral edema, balance deficits, hemoptysis, abdominal pain, nausea, melena, hematochezia, severe indigestion/heartburn, hematuria, incontinence, genital sores, muscle weakness, suspicious skin lesions, transient blindness, left sided weakness, difficulty walking, depression, unusual weight change, abnormal bleeding, enlarged lymph nodes, angioedema, and breast masses.  Past Medical History: Past Medical History:  Diagnosis Date  . Anticoagulant long-term use   . Arthritis   . Atrial fibrillation (HCC)    History of ablation - Dr. Rayann Heman  . Chest pain    Has normal coronaries per cath in 2005; negative nuclear in 2010  . Chronic anemia   . H/O syncope   . H/O: CVA (cardiovascular accident)    Right internal capsule  . High risk medication use    Tikosyn & Coumadin  . Hyperthyroidism   . Mild pulmonary hypertension ([place])   . Optic  atrophy of right eye 01/18/2015  . Orthostasis   . Pacemaker   . Retinal detachment   . Tachycardia-bradycardia syndrome ([place])   . Thrombocytopenia ([place])    Past Surgical History:  Procedure Laterality Date  . CARDIAC CATHETERIZATION    . CARDIAC PACEMAKER PLACEMENT    . PERMANENT PACEMAKER GENERATOR CHANGE N/A 06/12/2012   Procedure: PERMANENT PACEMAKER GENERATOR CHANGE;  Surgeon: Evans Lance, MD;  Location: [facility] CATH LAB;  Service: Cardiovascular;  Laterality: N/A;  . RETINAL DETACHMENT SURGERY      Medications: Prior to Admission medications   Medication Sig Start Date End Date Taking? Authorizing Provider  dofetilide (TIKOSYN) 250 MCG capsule Take 1 capsule (250 mcg total) by mouth 2 (two) times daily. 04/23/17  Yes [name], Peter M, MD  fludrocortisone (FLORINEF) 0.1 MG tablet Take 1 tablet (0.1 mg total) by mouth 2 (two) times daily. 04/23/17  Yes [name], Peter M, MD  potassium chloride SA (K-DUR,KLOR-CON) 20 MEQ tablet Take 2 tablets (40 mEq total) by mouth daily. 01/10/16  Yes [name], Peter M, MD  prednisoLONE acetate (PRED FORTE) 1 % ophthalmic suspension PLACE 1 DROP IN BOTH EYES 4 TIMES DAILY 11/08/16  Yes [provider]  warfarin (COUMADIN) 4 MG tablet Take 1/2 to 1 tablet by mouth  daily or as directed by  coumadin clinic Patient taking differently: Take 4 mg by mouth See admin instructions. Take 4mg  tab on  Monday and Friday,  1/2 tablet (2mg ) on Sunday, Tuesday, Wednesday, Thursday and Saturday. 04/23/17  Yes [name], Peter  M, MD    Allergies:   Allergies  Allergen Reactions  . Amiodarone Other (See Comments)    Caused thyroid problems  . Amoxicillin Hives  . Aspirin Other (See Comments)    On tikosyn and warfarin  . Codeine Other (See Comments)    Causes BP to drop  . Darvocet [Propoxyphene N-Acetaminophen] Hives  . Demerol Itching  . Penicillins Hives    Social History:  reports that she has never smoked. She has never used smokeless tobacco. She reports  that she does not drink alcohol or use drugs.  Family History: Family History  Problem Relation Age of Onset  . Heart disease Father   . Stroke Father   . Heart attack Father   . Heart disease Brother   . Heart disease Brother   . Hypertension Mother   . Diabetes Sister   . Heart attack Sister     Physical Exam: Vitals:   06/19/17 1630 06/19/17 1715 06/19/17 1730 06/19/17 1745  BP: (!) 156/67 (!) 143/73 140/66 (!) 152/68  Pulse: (!) 59 (!) 59 63 61  Resp: 12 (!) 23 17 (!) 21  Temp:      TempSrc:      SpO2: 100% 100% 100% 99%  Weight:      Height:        General:  Alert and oriented times three, well developed and nourished, no acute distress Eyes: PERRLA, pink conjunctiva, no scleral icterus ENT: Moist oral mucosa, neck supple, no thyromegaly Lungs: clear to ascultation, no wheeze, no crackles, no use of accessory muscles Cardiovascular: regular rate and rhythm, no regurgitation, no gallops, no murmurs. No carotid bruits, no JVD Abdomen: soft, positive BS, non-tender, non-distended, no organomegaly, not an acute abdomen GU: not examined Neuro: CN II - XII grossly intact, sensation intact Musculoskeletal: strength 4/5 LUE and LLE, no clubbing, cyanosis or edema Skin: no rash, no subcutaneous crepitation, no decubitus Psych: appropriate patient   Labs on Admission:  Recent Labs    06/19/17 1446  NA 141  K 4.0  CL 108  CO2 24  GLUCOSE 100*  BUN 24*  CREATININE 1.09*  CALCIUM 8.4*   No results for input(s): AST, ALT, ALKPHOS, BILITOT, PROT, ALBUMIN in the last 72 hours. No results for input(s): LIPASE, AMYLASE in the last 72 hours. Recent Labs    06/19/17 1446  WBC 4.8  HGB 13.3  HCT 40.2  MCV 95.9  PLT 120*   No results for input(s): CKTOTAL, CKMB, CKMBINDEX, TROPONINI in the last 72 hours. Invalid input(s): POCBNP No results for input(s): DDIMER in the last 72 hours. No results for input(s): HGBA1C in the last 72 hours. No results for input(s): CHOL,  HDL, LDLCALC, TRIG, CHOLHDL, LDLDIRECT in the last 72 hours. No results for input(s): TSH, T4TOTAL, T3FREE, THYROIDAB in the last 72 hours.  Invalid input(s): FREET3 No results for input(s): VITAMINB12, FOLATE, FERRITIN, TIBC, IRON, RETICCTPCT in the last 72 hours.  Micro Results: No results found for this or any previous visit (from the past 240 hour(s)).   Radiological Exams on Admission: Dg Chest 2 View  Result Date: 06/19/2017 CLINICAL DATA:  84 y/o  F; left-sided weakness for 1 month. EXAM: CHEST - 2 VIEW COMPARISON:  12/12/2015 chest radiograph FINDINGS: Normal cardiac silhouette. Aortic atherosclerosis with calcification. Two lead pacemaker. No focal consolidation. No pleural effusion or pneumothorax. No acute osseous abnormality is evident. IMPRESSION: No acute pulmonary process identified.  Aortic atherosclerosis. Electronically Signed   By: Kristine Garbe  M.D.   On: 06/19/2017 17:48   Ct Head Wo Contrast  Result Date: 06/19/2017 CLINICAL DATA:  Intermittent left-sided weakness for 1 month EXAM: CT HEAD WITHOUT CONTRAST TECHNIQUE: Contiguous axial images were obtained from the base of the skull through the vertex without intravenous contrast. COMPARISON:  Head CT 12/25/2016 FINDINGS: Brain: No mass lesion, intraparenchymal hemorrhage or extra-axial collection. No evidence of acute cortical infarct. Normal appearance of the brain parenchyma and extra axial spaces for age. Vascular: No hyperdense vessel or unexpected vascular calcification. Skull: Normal visualized skull base, calvarium and extracranial soft tissues. Sinuses/Orbits: No sinus fluid levels or advanced mucosal thickening. No mastoid effusion. Normal orbits. IMPRESSION: Normal aging brain. Electronically Signed   By: Ulyses Jarred M.D.   On: 06/19/2017 17:34    Assessment/Plan Present on Admission: Left sided weakness -Bring into observation on med telemetry -neuro checks to 4 hours -PT, OT, swallow  evaluation -baby ASA daily -Panel in a.m. -CT head 24 hours later.  Patient with pacemaker therefore unable to get MRI.  Carotid ultrasound, 2D echo ordered  chest pain -Monitor on telemetry, cycle cardiac enzymes, 2D echo ordered -Baby aspirin daily  Orthostatic hypotension -IVF hydration, continue florinef. Monitor on trele  . Complete heart block ([place]) -Permanent pacemaker in place  . PAROXYSMAL ATRIAL FIBRILLATION -now in NSR -continue tikosyn and coumadin. Pharmacy consult  Quintella Baton 06/19/2017, 6:37 PM

## 2017-06-19 NOTE — ED Provider Notes (Signed)
[place] [facility] EMERGENCY DEPARTMENT Provider Note   CSN: [id number] Arrival date & time: 06/19/17  1443     History   Chief Complaint Chief Complaint  Patient presents with  . Weakness    HPI Katrina Rodriguez is a 84 y.o. female.  HPI Katrina Rodriguez is a 84 y.o. female with history of atrial fibrillation, status post ablation and on Tikosyn, history of CVA, chronic left-sided weakness, history of syncopal episodes, has a pacemaker, anticoagulated, on Coumadin, chronic anemia, presents to emergency department with complaint of left-sided weakness.  Patient states she has had intermittent worsening left-sided weakness over the last week.  She states today she was sitting on her couch and has noticed that her left side is significantly more weak than normal.  She states she could not hold onto anything with left hand.  States she also has had generalized weakness and was having difficult to get up from the couch.  She also reports syncopal episode 2 days ago, but states that is not unusual for her, she states she has frequent episodes of syncope and that is not alarming to her. Denies headache. No neck pain. No SOB. No cough or URI symptoms. Continues to have left sided weakness. Pt lives at home alone, unknown definite time of onset of weakness.   Past Medical History:  Diagnosis Date  . Anticoagulant long-term use   . Arthritis   . Atrial fibrillation (HCC)    History of ablation - Dr. Rayann Heman  . Chest pain    Has normal coronaries per cath in 2005; negative nuclear in 2010  . Chronic anemia   . H/O syncope   . H/O: CVA (cardiovascular accident)    Right internal capsule  . High risk medication use    Tikosyn & Coumadin  . Hyperthyroidism   . Mild pulmonary hypertension ([place])   . 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 Orthostasis   . Pacemaker   . Retinal detachment   . Tachycardia-bradycardia syndrome ([place])   . Thrombocytopenia [facility])     Patient Active Problem  List   Diagnosis Date Noted  . 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 Encounter for therapeutic drug monitoring 06/09/2013  . Complete heart block ([place]) 06/09/2013  . Syncope 04/24/2013  . H/O   . PAROXYSMAL ATRIAL FIBRILLATION 02/02/2010  . 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 DEEP VENOUS THROMBOPHLEBITIS 09/17/2008  . HYPOTENSION, ORTHOSTATIC 09/17/2008  . SYNCOPE 09/17/2008  . CHEST PAIN 09/17/2008  . PPM-Medtronic 09/17/2008    Past Surgical History:  Procedure Laterality Date  . CARDIAC CATHETERIZATION    . CARDIAC PACEMAKER PLACEMENT    . PERMANENT PACEMAKER GENERATOR CHANGE N/A 06/12/2012   Procedure: PERMANENT PACEMAKER GENERATOR CHANGE;  Surgeon: Evans Lance, MD;  Location: [facility] CATH LAB;  Service: Cardiovascular;  Laterality: N/A;  . RETINAL DETACHMENT SURGERY       OB History   None      Home Medications    Prior to Admission medications   Medication Sig Start Date End Date Taking? Authorizing Provider  dofetilide (TIKOSYN) 250 MCG capsule Take 1 capsule (250 mcg total) by mouth 2 (two) times daily. 04/23/17   [name], Peter M, MD  fludrocortisone (FLORINEF) 0.1 MG tablet Take 1 tablet (0.1 mg total) by mouth 2 (two) times daily. 04/23/17   [name], Peter M, MD  potassium chloride SA (K-DUR,KLOR-CON) 20 MEQ tablet Take 2 tablets (40 mEq total) by mouth daily. 01/10/16   [name], Peter  M, MD  prednisoLONE acetate (PRED FORTE) 1 % ophthalmic suspension PLACE 1 DROP IN BOTH EYES 4 TIMES DAILY 11/08/16   [provider]  vitamin B-12 (CYANOCOBALAMIN) 1000 MCG tablet Take 1,000 mcg by mouth daily.    [provider]  warfarin (COUMADIN) 4 MG tablet Take 1/2 to 1 tablet by mouth  daily or as directed by  coumadin clinic 04/23/17   [name], Peter M, MD    Family History Family History  Problem Relation Age of Onset  . Heart disease Father   . Stroke Father   . Heart attack Father   . Heart disease Brother   . Heart disease Brother   .  Hypertension Mother   . Diabetes Sister   . Heart attack Sister     Social History Social History   Tobacco Use  . Smoking status: Never Smoker  . Smokeless tobacco: Never Used  Substance Use Topics  . Alcohol use: No  . Drug use: No     Allergies   Amiodarone; Amoxicillin; Aspirin; Codeine; Darvocet [propoxyphene n-acetaminophen]; Demerol; and Penicillins   Review of Systems Review of Systems  Constitutional: Positive for fatigue. Negative for chills and fever.  HENT: Negative for congestion and sore throat.   Respiratory: Negative for cough, chest tightness and shortness of breath.   Cardiovascular: Negative for chest pain, palpitations and leg swelling.  Gastrointestinal: Negative for abdominal pain, diarrhea, nausea and vomiting.  Genitourinary: Negative for dysuria, flank pain and pelvic pain.  Musculoskeletal: Negative for arthralgias, myalgias, neck pain and neck stiffness.  Skin: Negative for rash.  Neurological: Positive for weakness and numbness. Negative for dizziness, speech difficulty and headaches.  All other systems reviewed and are negative.    Physical Exam Updated Vital Signs Temp 97.6 F (36.4 C) (Oral)   Ht 5\' 4"  (1.626 m)   Wt 49.9 kg (110 lb)   BMI 18.88 kg/m   Physical Exam  Constitutional: She is oriented to person, place, and time. She appears well-developed and well-nourished. No distress.  HENT:  Head: Normocephalic.  Eyes: Pupils are equal, round, and reactive to light. Conjunctivae and EOM are normal.  Neck: Normal range of motion. Neck supple.  Cardiovascular: Normal rate and regular rhythm.  Murmur heard. Pulmonary/Chest: Effort normal and breath sounds normal. No respiratory distress. She has no wheezes. She has no rales.  Abdominal: Soft. Bowel sounds are normal. She exhibits no distension. There is no tenderness. There is no rebound.  Musculoskeletal: She exhibits no edema.  Neurological: She is alert and oriented to person,  place, and time. No cranial nerve deficit. Coordination normal.  Left arm and grip weakness, 4/5, compared to right side 5/5. Slight left leg weakness. Normal gait. Normal finger to nose and heel to shin.   Skin: Skin is warm and dry.  Psychiatric: She has a normal mood and affect. Her behavior is normal.  Nursing note and vitals reviewed.    ED Treatments / Results  Labs (all labs ordered are listed, but only abnormal results are displayed) Labs Reviewed  BASIC METABOLIC PANEL - Abnormal; Notable for the following components:      Result Value   Glucose, Bld 100 (*)    BUN 24 (*)    Creatinine, Ser 1.09 (*)    Calcium 8.4 (*)    GFR calc non Af Amer 45 (*)    GFR calc Af Amer 52 (*)    All other components within normal limits  CBC - Abnormal; Notable for  the following components:   Platelets 120 (*)    All other components within normal limits  URINALYSIS, ROUTINE W REFLEX MICROSCOPIC - Abnormal; Notable for the following components:   Hgb urine dipstick MODERATE (*)    Bacteria, UA RARE (*)    Squamous Epithelial / LPF 0-5 (*)    All other components within normal limits  PROTIME-INR - Abnormal; Notable for the following components:   Prothrombin Time 24.7 (*)    All other components within normal limits  I-STAT TROPONIN, ED  CBG MONITORING, ED    EKG EKG Interpretation  Date/Time:  Tuesday June 19 2017 14:46:58 EDT Ventricular Rate:  60 PR Interval:    QRS Duration: 108 QT Interval:  485 QTC Calculation: 485 R Axis:   39 Text Interpretation:  Sinus rhythm Abnormal R-wave progression, early transition No significant change since last tracing Confirmed by Dorie Rank [phone]) on 06/19/2017 2:50:08 PM   Radiology Dg Chest 2 View  Result Date: 06/19/2017 CLINICAL DATA:  84 y/o  F; left-sided weakness for 1 month. EXAM: CHEST - 2 VIEW COMPARISON:  12/12/2015 chest radiograph FINDINGS: Normal cardiac silhouette. Aortic atherosclerosis with calcification. Two lead  pacemaker. No focal consolidation. No pleural effusion or pneumothorax. No acute osseous abnormality is evident. IMPRESSION: No acute pulmonary process identified.  Aortic atherosclerosis. Electronically Signed   By: Kristine Garbe M.D.   On: 06/19/2017 17:48   Ct Head Wo Contrast  Result Date: 06/19/2017 CLINICAL DATA:  Intermittent left-sided weakness for 1 month EXAM: CT HEAD WITHOUT CONTRAST TECHNIQUE: Contiguous axial images were obtained from the base of the skull through the vertex without intravenous contrast. COMPARISON:  Head CT 12/25/2016 FINDINGS: Brain: No mass lesion, intraparenchymal hemorrhage or extra-axial collection. No evidence of acute cortical infarct. Normal appearance of the brain parenchyma and extra axial spaces for age. Vascular: No hyperdense vessel or unexpected vascular calcification. Skull: Normal visualized skull base, calvarium and extracranial soft tissues. Sinuses/Orbits: No sinus fluid levels or advanced mucosal thickening. No mastoid effusion. Normal orbits. IMPRESSION: Normal aging brain. Electronically Signed   By: Ulyses Jarred M.D.   On: 06/19/2017 17:34    Procedures Procedures (including critical care time)  Medications Ordered in ED Medications - No data to display   Initial Impression / Assessment and Plan / ED Course  I have reviewed the triage vital signs and the nursing notes.  Pertinent labs & imaging results that were available during my care of the patient were reviewed by me and considered in my medical decision making (see chart for details).     Pt with worsening left sided weakness. Hx of CVA in the past with left sided weakness but states it is much worse today. On exam, pt does have left sided weakness, especially in arm and grip. She ambulated normally, with slow stead gait. No headache. AAOx3. Also syncopal episode 2 days ago, which is not unusual for her. Will get labs, CT head, inetrrigate pace maker.   Labs, urine  analysis, CT head and chest x-ray negative.  Patient continues to have left-sided weakness that she states is worse than her baseline.  She states that this feels like when she had a stroke.  Unable to get MRI of her brain due to a pacemaker.  Will admit patient for further evaluation for possible stroke workup.  She has not had any recent workup for CVA.  Vital signs remain stable.  Patient agreeable to plan.   Spoke with medicine, they will admit patient.  Vitals:   06/19/17 1630 06/19/17 1715 06/19/17 1730 06/19/17 1745  BP: (!) 156/67 (!) 143/73 140/66 (!) 152/68  Pulse: (!) 59 (!) 59 63 61  Resp: 12 (!) 23 17 (!) 21  Temp:      TempSrc:      SpO2: 100% 100% 100% 99%  Weight:      Height:         Final Clinical Impressions(s) / ED Diagnoses   Final diagnoses:  Left-sided weakness    ED Discharge Orders    None       Jeannett Senior, PA-C 06/19/17 1850    Pattricia Boss, MD 06/21/17 [phone]

## 2017-06-19 NOTE — Progress Notes (Signed)
ANTICOAGULATION CONSULT NOTE - Initial Consult  Pharmacy Consult for warfarin Indication: atrial fibrillation  Allergies  Allergen Reactions  . Amiodarone Other (See Comments)    Caused thyroid problems  . Amoxicillin Hives  . Aspirin Other (See Comments)    On tikosyn and warfarin  . Codeine Other (See Comments)    Causes BP to drop  . Darvocet [Propoxyphene N-Acetaminophen] Hives  . Demerol Itching  . Penicillins Hives    Patient Measurements: Height: 5\' 4"  (162.6 cm) Weight: 110 lb (49.9 kg) IBW/kg (Calculated) : 54.7  Vital Signs: Temp: 97.6 F (36.4 C) (04/16 1423) Temp Source: Oral (04/16 1423) BP: 152/68 (04/16 1745) Pulse Rate: 61 (04/16 1745)  Labs: Recent Labs    06/19/17 1446  HGB 13.3  HCT 40.2  PLT 120*  LABPROT 24.7*  INR 2.26  CREATININE 1.09*    Estimated Creatinine Clearance: 29.2 mL/min (Katrina) (by C-G formula based on SCr of 1.09 mg/dL (H)).   Medical History: Past Medical History:  Diagnosis Date  . Anticoagulant long-term use   . Arthritis   . Atrial fibrillation (HCC)    History of ablation - Dr. Rayann Heman  . Chest pain    Has normal coronaries per cath in 2005; negative nuclear in 2010  . Chronic anemia   . H/O syncope   . H/O: CVA (cardiovascular accident)    Right internal capsule  . High risk medication use    Tikosyn & Coumadin  . Hyperthyroidism   . Mild pulmonary hypertension ([place])   . 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 Orthostasis   . Pacemaker   . Retinal detachment   . Tachycardia-bradycardia syndrome ([place])   . Thrombocytopenia (HCC)     Medications:  Scheduled:  . [START ON 06/20/2017] Warfarin - Pharmacist Dosing Inpatient   Does not apply q1800    Assessment: 84 yo female with history of CVA, afib s/p ablation on Tikosyn, and pacemaker, presented to ED with shortness of breath. Pharmacy consulted for warfarin dosing. Patient takes warfarin at home for afib. INR on admit is therapeutic at 2.2.  PTA dosing:  2mg  daily except, 4 mg on M/F  Patient took last dose on 4/16. No reports of bleeding.  Goal of Therapy:  INR 2-3 Monitor platelets by anticoagulation protocol: Yes   Plan:  No warfarin tonight (patient already took today's dose) Daily INR  Katrina Rodriguez Katrina Rodriguez 06/19/2017,7:14 PM

## 2017-06-19 NOTE — ED Triage Notes (Signed)
Per EMS pt is from home and lives alone.  She states that she has had moments of left sided weakness over the last 1 months.  Today she had another moment of left sided weakness where she states needing to grab hold of something so she wouldn't fall to the ground.  She called EMS and is AOx4 has a history of stroke 5-6 years ago.  Pt does have AFIB and takes Coumadin.  She also states that this past Sunday she passed out at [employer].  Currently she is AOx4 NAD noted denies pain.

## 2017-06-20 ENCOUNTER — Other Ambulatory Visit: Payer: Self-pay

## 2017-06-20 ENCOUNTER — Observation Stay (HOSPITAL_COMMUNITY): Payer: Medicare Other

## 2017-06-20 ENCOUNTER — Encounter (HOSPITAL_COMMUNITY): Payer: Self-pay | Admitting: General Practice

## 2017-06-20 ENCOUNTER — Observation Stay (HOSPITAL_BASED_OUTPATIENT_CLINIC_OR_DEPARTMENT_OTHER): Payer: Medicare Other

## 2017-06-20 DIAGNOSIS — I340 Nonrheumatic mitral (valve) insufficiency: Secondary | ICD-10-CM | POA: Diagnosis not present

## 2017-06-20 DIAGNOSIS — I442 Atrioventricular block, complete: Secondary | ICD-10-CM | POA: Diagnosis not present

## 2017-06-20 DIAGNOSIS — H5461 Unqualified visual loss, right eye, normal vision left eye: Secondary | ICD-10-CM | POA: Diagnosis present

## 2017-06-20 DIAGNOSIS — Z880 Allergy status to penicillin: Secondary | ICD-10-CM | POA: Diagnosis not present

## 2017-06-20 DIAGNOSIS — I480 Paroxysmal atrial fibrillation: Secondary | ICD-10-CM

## 2017-06-20 DIAGNOSIS — I495 Sick sinus syndrome: Secondary | ICD-10-CM | POA: Diagnosis present

## 2017-06-20 DIAGNOSIS — R531 Weakness: Secondary | ICD-10-CM | POA: Diagnosis not present

## 2017-06-20 DIAGNOSIS — D696 Thrombocytopenia, unspecified: Secondary | ICD-10-CM | POA: Diagnosis present

## 2017-06-20 DIAGNOSIS — I639 Cerebral infarction, unspecified: Secondary | ICD-10-CM | POA: Diagnosis not present

## 2017-06-20 DIAGNOSIS — G459 Transient cerebral ischemic attack, unspecified: Secondary | ICD-10-CM | POA: Diagnosis not present

## 2017-06-20 DIAGNOSIS — E039 Hypothyroidism, unspecified: Secondary | ICD-10-CM | POA: Diagnosis not present

## 2017-06-20 DIAGNOSIS — H4720 Unspecified optic atrophy: Secondary | ICD-10-CM | POA: Diagnosis present

## 2017-06-20 DIAGNOSIS — I951 Orthostatic hypotension: Secondary | ICD-10-CM | POA: Diagnosis present

## 2017-06-20 DIAGNOSIS — R0789 Other chest pain: Secondary | ICD-10-CM | POA: Diagnosis present

## 2017-06-20 DIAGNOSIS — I69354 Hemiplegia and hemiparesis following cerebral infarction affecting left non-dominant side: Secondary | ICD-10-CM | POA: Diagnosis not present

## 2017-06-20 DIAGNOSIS — I669 Occlusion and stenosis of unspecified cerebral artery: Secondary | ICD-10-CM | POA: Diagnosis present

## 2017-06-20 DIAGNOSIS — Z7901 Long term (current) use of anticoagulants: Secondary | ICD-10-CM | POA: Diagnosis not present

## 2017-06-20 DIAGNOSIS — Z7952 Long term (current) use of systemic steroids: Secondary | ICD-10-CM | POA: Diagnosis not present

## 2017-06-20 DIAGNOSIS — G464 Cerebellar stroke syndrome: Secondary | ICD-10-CM | POA: Diagnosis not present

## 2017-06-20 DIAGNOSIS — R0602 Shortness of breath: Secondary | ICD-10-CM | POA: Diagnosis present

## 2017-06-20 DIAGNOSIS — E785 Hyperlipidemia, unspecified: Secondary | ICD-10-CM

## 2017-06-20 DIAGNOSIS — H469 Unspecified optic neuritis: Secondary | ICD-10-CM | POA: Diagnosis present

## 2017-06-20 DIAGNOSIS — Z885 Allergy status to narcotic agent status: Secondary | ICD-10-CM | POA: Diagnosis not present

## 2017-06-20 DIAGNOSIS — M24572 Contracture, left ankle: Secondary | ICD-10-CM | POA: Diagnosis present

## 2017-06-20 DIAGNOSIS — I10 Essential (primary) hypertension: Secondary | ICD-10-CM | POA: Diagnosis present

## 2017-06-20 DIAGNOSIS — I482 Chronic atrial fibrillation: Secondary | ICD-10-CM | POA: Diagnosis not present

## 2017-06-20 DIAGNOSIS — Z66 Do not resuscitate: Secondary | ICD-10-CM | POA: Diagnosis present

## 2017-06-20 DIAGNOSIS — Z886 Allergy status to analgesic agent status: Secondary | ICD-10-CM | POA: Diagnosis not present

## 2017-06-20 DIAGNOSIS — Z950 Presence of cardiac pacemaker: Secondary | ICD-10-CM | POA: Diagnosis not present

## 2017-06-20 LAB — BASIC METABOLIC PANEL
Anion gap: 7 (ref 5–15)
BUN: 18 mg/dL (ref 6–20)
CHLORIDE: 110 mmol/L (ref 101–111)
CO2: 23 mmol/L (ref 22–32)
CREATININE: 0.98 mg/dL (ref 0.44–1.00)
Calcium: 8.4 mg/dL — ABNORMAL LOW (ref 8.9–10.3)
GFR calc non Af Amer: 51 mL/min — ABNORMAL LOW (ref >60)
GFR, EST AFRICAN AMERICAN: 59 mL/min — AB (ref >60)
Glucose, Bld: 90 mg/dL (ref 65–99)
Potassium: 4.2 mmol/L (ref 3.5–5.1)
SODIUM: 140 mmol/L (ref 135–145)

## 2017-06-20 LAB — CBC
HCT: 41.5 % (ref 36.0–46.0)
Hemoglobin: 13.5 g/dL (ref 12.0–15.0)
MCH: 31.6 pg (ref 26.0–34.0)
MCHC: 32.5 g/dL (ref 30.0–36.0)
MCV: 97.2 fL (ref 78.0–100.0)
PLATELETS: 114 10*3/uL — AB (ref 150–400)
RBC: 4.27 MIL/uL (ref 3.87–5.11)
RDW: 13.6 % (ref 11.5–15.5)
WBC: 4.9 10*3/uL (ref 4.0–10.5)

## 2017-06-20 LAB — ECHOCARDIOGRAM COMPLETE
Height: 64 in
Weight: 1760 oz

## 2017-06-20 LAB — LIPID PANEL
Cholesterol: 206 mg/dL — ABNORMAL HIGH (ref 0–200)
HDL: 54 mg/dL (ref >40)
LDL CALC: 134 mg/dL — AB (ref 0–99)
TRIGLYCERIDES: 90 mg/dL (ref <150)
Total CHOL/HDL Ratio: 3.8 RATIO
VLDL: 18 mg/dL (ref 0–40)

## 2017-06-20 LAB — TROPONIN I
Troponin I: <0.03 ng/mL (ref <0.03)
Troponin I: <0.03 ng/mL (ref <0.03)

## 2017-06-20 LAB — PROTIME-INR
INR: 2.13
Prothrombin Time: 23.6 seconds — ABNORMAL HIGH (ref 11.4–15.2)

## 2017-06-20 MED ORDER — ATORVASTATIN CALCIUM 40 MG PO TABS
40.0000 mg | ORAL_TABLET | Freq: Every day | ORAL | Status: DC
  Filled 2017-06-20 (×2): qty 1

## 2017-06-20 MED ORDER — WARFARIN SODIUM 3 MG PO TABS
3.0000 mg | ORAL_TABLET | Freq: Once | ORAL | Status: AC
  Administered 2017-06-20: 3 mg via ORAL
  Filled 2017-06-20: qty 1

## 2017-06-20 NOTE — Progress Notes (Signed)
ANTICOAGULATION CONSULT NOTE - Follow Up Consult  Pharmacy Consult for Coumadin Indication: atrial fibrillation and prior stroke  Allergies  Allergen Reactions  . Amiodarone Other (See Comments)    Caused thyroid problems  . Amoxicillin Hives  . Aspirin Other (See Comments)    On tikosyn and warfarin  . Codeine Other (See Comments)    Causes BP to drop  . Darvocet [Propoxyphene N-Acetaminophen] Hives  . Demerol Itching  . Penicillins Hives    Patient Measurements: Height: 5\' 4"  (162.6 cm) Weight: 110 lb (49.9 kg) IBW/kg (Calculated) : 54.7  Vital Signs: Temp: 97.9 F (36.6 C) (04/17 1525) Temp Source: Oral (04/17 1525) BP: 115/59 (04/17 1525) Pulse Rate: 70 (04/17 1525)  Labs: Recent Labs    06/19/17 1446 06/20/17 0003 06/20/17 0800 06/20/17 1232  HGB 13.3  --  13.5  --   HCT 40.2  --  41.5  --   PLT 120*  --  114*  --   LABPROT 24.7*  --  23.6*  --   INR 2.26  --  2.13  --   CREATININE 1.09*  --  0.98  --   TROPONINI  --  <0.03 <0.03 <0.03    Estimated Creatinine Clearance: 32.5 mL/min (by C-G formula based on SCr of 0.98 mg/dL).  Assessment:  84 yr old female continues on Coumadin for atrial fibrillation and prior stroke.  INR 2.26>2.13 after usual Coumadin dose of 2 mg on 4/16 prior to admission. Therapeutic, but trended down.    Neuro notes Stroke team to make a decision on whether to change anticoagulant, if this is considered to be a failure of Coumadin.   Home Coumadin regimen: 2 mg daily except 4 mg on Mondays and Fridays.  Goal of Therapy:  INR 2-3 Monitor platelets by anticoagulation protocol: Yes   Plan:   Coumadin 3 mg x 1 today, to try to avoid further INR decline.  Daily PT/INR.  Follow up anticoagulation plans.  Arty Baumgartner, [place] Pager: [phone] 06/20/2017,4:56 PM

## 2017-06-20 NOTE — Progress Notes (Signed)
Carotid duplex prelim: 1-39% ICA stenosis.  Katrina Rodriguez, RDMS, RVT   

## 2017-06-20 NOTE — Progress Notes (Signed)
  Echocardiogram 2D Echocardiogram has been performed.  Katrina Rodriguez T Seng Fouts 06/20/2017, 10:19 AM

## 2017-06-20 NOTE — Evaluation (Signed)
Occupational Therapy Evaluation Patient Details Name: Katrina Rodriguez MRN: [id number] DOB: Nov 25, 1928 Today's Date: 06/20/2017    History of Present Illness 84 y.o. female with history of atrial fibrillation, status post ablation and on Tikosyn, history of CVA, chronic left-sided weakness, history of syncopal episodes, has a pacemaker, anticoagulated, on Coumadin, chronic anemia, presents to emergency department with complaint of left-sided weakness   Clinical Impression   Pt admitted with above and presents to OT with deficits (see OT problem list) impacting participation in ADLs.  Pt demonstrating decreased balance with posterior lean in sitting and in standing, requiring up to mod assist to correct LOB and mod (lifting) assist for sit > stand.  Therapist providing cues for hand placement to increase independence and safety with pt able to complete sit > stand with min assist.  Pt will benefit from continued OT acutely to address deficits and maximize independence with ADLs in preparation to d/c to venue listed below.  Recommend CIR consult.    Follow Up Recommendations  CIR;Supervision/Assistance - 24 hour    Equipment Recommendations  None recommended by OT    Recommendations for Other Services Rehab consult     Precautions / Restrictions Precautions Precautions: Fall      Mobility Bed Mobility Overal bed mobility: Needs Assistance Bed Mobility: Supine to Sit;Sit to Supine     Supine to sit: Supervision Sit to supine: Supervision   General bed mobility comments: Supervision for safety, increased time and effort to perform  Transfers Overall transfer level: Needs assistance Equipment used: Rolling walker (2 wheeled);1 person hand held assist Transfers: Sit to/from [facility] Sit to Stand: Mod assist;Min assist Stand pivot transfers: Min assist       General transfer comment: Mod (lifting) assist and mod assist with posterior LOB in standing with reports  of increased light-headedness    Balance Overall balance assessment: Needs assistance Sitting-balance support: Feet supported Sitting balance-Leahy Scale: Fair     Standing balance support: During functional activity;Bilateral upper extremity supported Standing balance-Leahy Scale: Poor Standing balance comment: patient with noted instability impeding balance                           ADL either performed or assessed with clinical judgement   ADL Overall ADL's : Needs assistance/impaired         Upper Body Bathing: Minimal assistance;Standing   Lower Body Bathing: Moderate assistance;Sit to/from stand   Upper Body Dressing : Minimal assistance;Standing   Lower Body Dressing: Moderate assistance;Sit to/from stand   Toilet Transfer: Minimal assistance;BSC           Functional mobility during ADLs: Minimal assistance;Moderate assistance General ADL Comments: required mod (lifting) assist sit > stand initially fading to min assist, also required mod assist with LOB backwards in standing     Vision Baseline Vision/History: Legally blind(reports no vision in Lt eye and fluctuating vision in Rt eye) Patient Visual Report: No change from baseline Vision Assessment?: Vision impaired- to be further tested in functional context            Pertinent Vitals/Pain Pain Assessment: 0-10 Pain Score: 5  Pain Location: headache Pain Descriptors / Indicators: Aching;Headache Pain Intervention(s): Monitored during session;Repositioned;Patient requesting pain meds-RN notified;Limited activity within patient's tolerance     Hand Dominance Right   Extremity/Trunk Assessment Upper Extremity Assessment Upper Extremity Assessment: Generalized weakness;LUE deficits/detail LUE Deficits / Details: grossly 4/5, loose gross grasp LUE Sensation: decreased light touch LUE  Coordination: decreased fine motor   Lower Extremity Assessment Lower Extremity Assessment: Generalized  weakness;LLE deficits/detail LLE Deficits / Details: noted LLE weakness modest assymetry LLE Coordination: decreased fine motor;decreased gross motor       Communication Communication Communication: No difficulties   Cognition Arousal/Alertness: Awake/alert Behavior During Therapy: WFL for tasks assessed/performed Overall Cognitive Status: Within Functional Limits for tasks assessed                                                Home Living Family/patient expects to be discharged to:: Private residence Living Arrangements: Alone   Type of Home: House Home Access: Stairs to enter Technical brewer of Steps: 3(has lift chair) Entrance Stairs-Rails: Can reach both Home Layout: One level     Bathroom Shower/Tub: Tub/shower unit(deep tub, typically sponge bathes but likes to soak in tub occasionally)   Bathroom Toilet: Handicapped height     Home Equipment: Environmental consultant - 2 wheels;Cane - single point;Shower seat(lift chair)   Additional Comments: She reports having a friend/church member that does her housekeeping and is present to assist with meal prep.  Reports friends/church members take her to the grocery store and the doctor.      Prior Functioning/Environment Level of Independence: Independent with assistive device(s)                 OT Problem List: Decreased strength;Decreased range of motion;Decreased activity tolerance;Impaired balance (sitting and/or standing);Decreased coordination;Cardiopulmonary status limiting activity;Pain      OT Treatment/Interventions: Self-care/ADL training;DME and/or AE instruction;Therapeutic activities;Therapeutic exercise;Patient/family education;Balance training    OT Goals(Current goals can be found in the care plan section) Acute Rehab OT Goals Patient Stated Goal: to go back home OT Goal Formulation: With patient Time For Goal Achievement: 07/04/17 Potential to Achieve Goals: Good  OT Frequency: Min  2X/week   Barriers to D/C: Decreased caregiver support  pt lives alone          AM-PAC PT "6 Clicks" Daily Activity     Outcome Measure Help from another person eating meals?: A Little Help from another person taking care of personal grooming?: A Little Help from another person toileting, which includes using toliet, bedpan, or urinal?: A Little Help from another person bathing (including washing, rinsing, drying)?: A Little Help from another person to put on and taking off regular upper body clothing?: A Little Help from another person to put on and taking off regular lower body clothing?: A Lot 6 Click Score: 17   End of Session Equipment Utilized During Treatment: Rolling walker  Activity Tolerance: Patient tolerated treatment well Patient left: in bed;with call bell/phone within reach;with bed alarm set  OT Visit Diagnosis: Unsteadiness on feet (R26.81);Muscle weakness (generalized) (M62.81);Pain                Time: 7416-3845 OT Time Calculation (min): 22 min Charges:  OT General Charges $OT Visit: 1 Visit OT Evaluation $OT Eval Moderate Complexity: [place], [place] , [place] 06/20/2017, 12:03 PM

## 2017-06-20 NOTE — Progress Notes (Signed)
PROGRESS NOTE    Katrina Rodriguez  VOJ:[id number] DOB: [birth year] DOA: 06/19/2017 PCP: Carol Ada, MD   Outpatient Specialists:  [name] (cards) [name] (neuro)   Brief Narrative:  84y/o female who lives alone she has a h/o CVA with left sided stable weakness. she states she has been feeling weaker the last week or so but today she had definite worsened LUE and LLE weakness that has persisted all day.   Assessment & Plan:   Active Problems:   PAROXYSMAL ATRIAL FIBRILLATION   Complete heart block (HCC)   Hypothyroidism   Left-sided weakness   Left sided weakness- worse than baseline -tele -neuro consult-- Dr. Carloyn Jaeger to see and order further imaging if needed (unable to have MRI) -PT, OT, swallow evaluation -baby ASA daily -warfarin -LDL > 70-- added statin -echo/carotid  chest pain - 2D echo ordered -CE negative  Orthostatic hypotension -continue florinef   Complete heart block (HCC) -Permanent pacemaker in place >68yrs old  PAROXYSMAL ATRIAL FIBRILLATION -now in NSR -continue tikosyn and coumadin. Pharmacy consult for dosing     DVT prophylaxis:  Fully anticoagulated   Code Status: DNR   Family Communication:   Disposition Plan:  Pending-- not safe to go home   Consultants:   neuro   Subjective: Says Dr. [name] says she can follow what ever diet she wants  Objective: Vitals:   06/19/17 2300 06/20/17 0416 06/20/17 0724 06/20/17 1108  BP:  (!) 118/55 115/68 (!) 146/73  Pulse: 62 (!) 51 60 63  Resp: 16 16 16 16   Temp: 97.7 F (36.5 C)  98 F (36.7 C) 98 F (36.7 C)  TempSrc: Oral  Oral Oral  SpO2:  95% 99% 100%  Weight:      Height:        Intake/Output Summary (Last 24 hours) at 06/20/2017 1249 Last data filed at 06/20/2017 0600 Gross per 24 hour  Intake 937.5 ml  Output -  Net 937.5 ml   Filed Weights   06/19/17 1424  Weight: 49.9 kg (110 lb)    Examination:  General exam: working with PT-- visually  impaired Respiratory system: Clear to auscultation. Respiratory effort normal. Cardiovascular system: rrr Gastrointestinal system: Abdomen is nondistended, soft and nontender. No organomegaly or masses felt. Normal bowel sounds heard. Central nervous system: Alert- pleasant and cooperative-- falls backwards Extremities: Symmetric 5 x 5 power. Skin: No rashes, lesions or ulcers Psychiatry: Judgement and insight appear normal. Mood & affect appropriate.     Data Reviewed: I have personally reviewed following labs and imaging studies  CBC: Recent Labs  Lab 06/19/17 1446 06/20/17 0800  WBC 4.8 4.9  HGB 13.3 13.5  HCT 40.2 41.5  MCV 95.9 97.2  PLT 120* 993*   Basic Metabolic Panel: Recent Labs  Lab 06/19/17 1446 06/20/17 0800  NA 141 140  K 4.0 4.2  CL 108 110  CO2 24 23  GLUCOSE 100* 90  BUN 24* 18  CREATININE 1.09* 0.98  CALCIUM 8.4* 8.4*   GFR: Estimated Creatinine Clearance: 32.5 mL/min (by C-G formula based on SCr of 0.98 mg/dL). Liver Function Tests: No results for input(s): AST, ALT, ALKPHOS, BILITOT, PROT, ALBUMIN in the last 168 hours. No results for input(s): LIPASE, AMYLASE in the last 168 hours. No results for input(s): AMMONIA in the last 168 hours. Coagulation Profile: Recent Labs  Lab 06/19/17 1446 06/20/17 0800  INR 2.26 2.13   Cardiac Enzymes: Recent Labs  Lab 06/20/17 0003 06/20/17 0800  TROPONINI <0.03 <0.03  BNP (last 3 results) No results for input(s): PROBNP in the last 8760 hours. HbA1C: No results for input(s): HGBA1C in the last 72 hours. CBG: No results for input(s): GLUCAP in the last 168 hours. Lipid Profile: Recent Labs    06/20/17 0800  CHOL 206*  HDL 54  LDLCALC 134*  TRIG 90  CHOLHDL 3.8   Thyroid Function Tests: No results for input(s): TSH, T4TOTAL, FREET4, T3FREE, THYROIDAB in the last 72 hours. Anemia Panel: No results for input(s): VITAMINB12, FOLATE, FERRITIN, TIBC, IRON, RETICCTPCT in the last 72  hours. Urine analysis:    Component Value Date/Time   COLORURINE YELLOW 06/19/2017 1528   APPEARANCEUR CLEAR 06/19/2017 1528   LABSPEC 1.011 06/19/2017 1528   PHURINE 5.0 06/19/2017 1528   GLUCOSEU NEGATIVE 06/19/2017 1528   HGBUR MODERATE (A) 06/19/2017 1528   BILIRUBINUR NEGATIVE 06/19/2017 [place] 06/19/2017 1528   PROTEINUR NEGATIVE 06/19/2017 1528   NITRITE NEGATIVE 06/19/2017 1528   LEUKOCYTESUR NEGATIVE 06/19/2017 1528     )No results found for this or any previous visit (from the past 240 hour(s)).    Anti-infectives (From admission, onward)   None       Radiology Studies: Dg Chest 2 View  Result Date: 06/19/2017 CLINICAL DATA:  84 y/o  F; left-sided weakness for 1 month. EXAM: CHEST - 2 VIEW COMPARISON:  12/12/2015 chest radiograph FINDINGS: Normal cardiac silhouette. Aortic atherosclerosis with calcification. Two lead pacemaker. No focal consolidation. No pleural effusion or pneumothorax. No acute osseous abnormality is evident. IMPRESSION: No acute pulmonary process identified.  Aortic atherosclerosis. Electronically Signed   By: Kristine Garbe M.D.   On: 06/19/2017 17:48   Ct Head Wo Contrast  Result Date: 06/19/2017 CLINICAL DATA:  Intermittent left-sided weakness for 1 month EXAM: CT HEAD WITHOUT CONTRAST TECHNIQUE: Contiguous axial images were obtained from the base of the skull through the vertex without intravenous contrast. COMPARISON:  Head CT 12/25/2016 FINDINGS: Brain: No mass lesion, intraparenchymal hemorrhage or extra-axial collection. No evidence of acute cortical infarct. Normal appearance of the brain parenchyma and extra axial spaces for age. Vascular: No hyperdense vessel or unexpected vascular calcification. Skull: Normal visualized skull base, calvarium and extracranial soft tissues. Sinuses/Orbits: No sinus fluid levels or advanced mucosal thickening. No mastoid effusion. Normal orbits. IMPRESSION: Normal aging brain.  Electronically Signed   By: Ulyses Jarred M.D.   On: 06/19/2017 17:34        Scheduled Meds: . aspirin EC  81 mg Oral Daily  . atorvastatin  40 mg Oral q1800  . dofetilide  250 mcg Oral BID  . fludrocortisone  0.1 mg Oral BID  . potassium chloride SA  40 mEq Oral Daily  . prednisoLONE acetate  1 drop Both Eyes QID  . Warfarin - Pharmacist Dosing Inpatient   Does not apply q1800   Continuous Infusions:   LOS: 0 days    Time spent: 35 min    Geradine Girt, DO [facility] Pager [phone]  If 7PM-7AM, please contact night-coverage [email or web address] Password [facility] 06/20/2017, 12:49 PM

## 2017-06-20 NOTE — Progress Notes (Addendum)
Pt refusing lipitor, states she doesn't have high cholesterol  RN educated on need to take medication

## 2017-06-20 NOTE — Evaluation (Signed)
Physical Therapy Evaluation Patient Details Name: Katrina Rodriguez MRN: [id number] DOB: 1928/07/02 Today's Date: 06/20/2017   History of Present Illness  84 y.o. female with history of atrial fibrillation, status post ablation and on Tikosyn, history of CVA, chronic left-sided weakness, history of syncopal episodes, has a pacemaker, anticoagulated, on Coumadin, chronic anemia, presents to emergency department with complaint of left-sided weakness  Clinical Impression  Orders received for PT evaluation. Patient demonstrates deficits in functional mobility as indicated below. Will benefit from continued skilled PT to address deficits and maximize function. Will see as indicated and progress as tolerated.  Prior to admission patient was very independent and managing well on her own. In current state, patient demonstrates balance deviations and strength deficits deviating from her baseline and impacting her ability to return home safely. Feel patient would benefit from continued post acute therapies. Recommend CIR consult.    Follow Up Recommendations CIR;Supervision/Assistance - 24 hour    Equipment Recommendations  (TBD)    Recommendations for Other Services       Precautions / Restrictions Precautions Precautions: Fall      Mobility  Bed Mobility Overal bed mobility: Needs Assistance Bed Mobility: Supine to Sit;Sit to Supine     Supine to sit: Supervision Sit to supine: Supervision   General bed mobility comments: Supervision for safety, increased time and effort to perform  Transfers Overall transfer level: Needs assistance Equipment used: Rolling walker (2 wheeled);1 person hand held assist Transfers: Sit to/from [facility] Sit to Stand: Min assist Stand pivot transfers: Min assist       General transfer comment: Min assist with HHA to pivot to chair and min assist to elevate from [facility] to RW  Ambulation/Gait Ambulation/Gait assistance: Min assist;Mod  assist Ambulation Distance (Feet): 20 Feet Assistive device: Rolling walker (2 wheeled) Gait Pattern/deviations: Step-to pattern;Decreased stride length;Shuffle;Leaning posteriorly Gait velocity: decreased   General Gait Details: patient weith noted posterior instability requiring hands on physical assist at all times, increased to moderate assist during LOB despite use of assistive device  Stairs            Wheelchair Mobility    Modified Rankin (Stroke Patients Only) Modified Rankin (Stroke Patients Only) Pre-Morbid Rankin Score: Moderate disability Modified Rankin: Moderately severe disability     Balance Overall balance assessment: Needs assistance Sitting-balance support: Feet supported Sitting balance-Leahy Scale: Fair     Standing balance support: During functional activity;Bilateral upper extremity supported Standing balance-Leahy Scale: Poor Standing balance comment: patient with noted instability impeding balance                             Pertinent Vitals/Pain Pain Assessment: 0-10 Pain Score: 5  Pain Location: headache Pain Descriptors / Indicators: Aching;Headache Pain Intervention(s): Monitored during session;Repositioned;Patient requesting pain meds-RN notified;Limited activity within patient's tolerance    Home Living Family/patient expects to be discharged to:: Private residence Living Arrangements: Alone   Type of Home: House Home Access: Stairs to enter Entrance Stairs-Rails: Can reach both Entrance Stairs-Number of Steps: 3(has lift chair) Home Layout: One level Home Equipment: Walker - 2 wheels;Cane - single point;Shower seat(lift chair)      Prior Function Level of Independence: Independent with assistive device(s)               Hand Dominance   Dominant Hand: Right    Extremity/Trunk Assessment   Upper Extremity Assessment Upper Extremity Assessment: Defer to OT evaluation  Lower Extremity Assessment Lower  Extremity Assessment: Generalized weakness;LLE deficits/detail LLE Deficits / Details: noted LLE weakness modest assymetry LLE Coordination: decreased fine motor;decreased gross motor       Communication   Communication: No difficulties  Cognition Arousal/Alertness: Awake/alert Behavior During Therapy: WFL for tasks assessed/performed Overall Cognitive Status: Within Functional Limits for tasks assessed                                        General Comments      Exercises     Assessment/Plan    PT Assessment Patient needs continued PT services  PT Problem List Decreased strength;Decreased activity tolerance;Decreased balance;Decreased mobility;Decreased coordination;Decreased safety awareness       PT Treatment Interventions DME instruction;Gait training;Functional mobility training;Therapeutic activities;Therapeutic exercise;Balance training;Neuromuscular re-education;Patient/family education    PT Goals (Current goals can be found in the Care Plan section)  Acute Rehab PT Goals Patient Stated Goal: to go back home PT Goal Formulation: With patient Time For Goal Achievement: 07/04/17 Potential to Achieve Goals: Good    Frequency Min 4X/week   Barriers to discharge Decreased caregiver support      Co-evaluation               AM-PAC PT "6 Clicks" Daily Activity  Outcome Measure Difficulty turning over in bed (including adjusting bedclothes, sheets and blankets)?: A Little Difficulty moving from lying on back to sitting on the side of the bed? : A Little Difficulty sitting down on and standing up from a chair with arms (e.g., wheelchair, bedside commode, etc,.)?: A Little Help needed moving to and from a bed to chair (including a wheelchair)?: A Little Help needed walking in hospital room?: A Lot Help needed climbing 3-5 steps with a railing? : A Little 6 Click Score: 17    End of Session Equipment Utilized During Treatment: Gait  belt Activity Tolerance: Patient tolerated treatment well Patient left: in bed;with call bell/phone within reach;with bed alarm set;with SCD's reapplied Nurse Communication: Mobility status PT Visit Diagnosis: Unsteadiness on feet (R26.81);Muscle weakness (generalized) (M62.81);Difficulty in walking, not elsewhere classified (R26.2)    Time: 6195-0932 PT Time Calculation (min) (ACUTE ONLY): 21 min   Charges:   PT Evaluation $PT Eval Moderate Complexity: 1 Mod     PT G Codes:        Alben Deeds, PT DPT  Board Certified Neurologic Specialist [place] 06/20/2017, 11:34 AM

## 2017-06-20 NOTE — Progress Notes (Signed)
Rehab Admissions Coordinator Note:  Patient was screened by Cleatrice Burke for appropriateness for an Inpatient Acute Rehab Consult per PT recommendation.  At this time, we are recommending await repeat CT scan to determine diagnosis for possible admit to CIR.. If confirmed CVA, rec CIR consult. I will follow.  Cleatrice Burke 06/20/2017, 11:54 AM  I can be reached at [phone].

## 2017-06-20 NOTE — Care Management Note (Signed)
Case Management Note  Patient Details  Name: Katrina Rodriguez MRN: [id number] Date of Birth: 07-31-1928  Subjective/Objective:  Pt in with weakness. She is from home alone.                  Action/Plan: PT recommending CIR. Awaiting OT eval. CM following for d/c disposition.   Expected Discharge Date:                  Expected Discharge Plan:  [place]  In-House Referral:     Discharge planning Services  CM Consult  Post Acute Care Choice:    Choice offered to:     DME Arranged:    DME Agency:     HH Arranged:    [place] Agency:     Status of Service:  In process, will continue to follow  If discussed at Long Length of Stay Meetings, dates discussed:    Additional Comments:  Pollie Friar, RN 06/20/2017, 11:34 AM

## 2017-06-20 NOTE — Consult Note (Addendum)
Requesting Physician: Dr. Eliseo Squires    Chief Complaint: Stroke  History obtained from:  Patient    HPI:                                                                                                                                         Katrina Rodriguez is an 84 y.o. 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  Patient is on Coumadin for paroxysmal A. fib with an INR of 2.15.  Currently in sinus rhythm on EKG.  Patient states that she went to sleep feeling normal night before last however upon awakening at approximately 3 AM she noted something was different but states that it was really 7:00 in the morning that she noted that her left arm and left leg were weak and had decreased sensation.  This reason patient called EMS and was brought in.  A code stroke was not called.  Patient does have a pacemaker that was placed at least in 2003 if not prior and is not MRI compatible as she has seen previous doctors who needed to take out the pacemaker to do any further imaging or surgery.  CT of head was obtained upon entering the hospital which did not show any acute stroke however patient still has left arm left leg weakness and decreased sensation which has not changed.  Neurology was called to evaluate patient for possible stroke.  Reports having had left-sided weakness from prior mini strokes.  Symptoms have never been persistent for as long as they have been persistent now. Denies chest pain, shortness of breath nausea vomiting.  Denies fevers or chills preceding this.  Denies cough.  Denies easy bleeding bruising.   Date last known well: Date: 06/18/2017 Time last known well: Unable to determine tPA Given: No: On Coumadin with therapeutic INR NIH stroke scale of 8.  This was for visual fields-3, left arm motor-1, left leg motor-1, sensory-1, extinction-2 Modified Rankin: Rankin Score=3  Past Medical History:  Diagnosis Date  . Anticoagulant long-term use   .  Arthritis   . Atrial fibrillation (HCC)    History of ablation - Dr. Rayann Heman  . Chest pain    Has normal coronaries per cath in 2005; negative nuclear in 2010  . Chronic anemia   . H/O syncope   . H/O: CVA (cardiovascular accident)    Right internal capsule  . High risk medication use    Tikosyn & Coumadin  . Hyperthyroidism   . Mild pulmonary hypertension ([place])   . 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 Orthostasis   . Pacemaker   . Retinal detachment   . Tachycardia-bradycardia syndrome ([place])   . Thrombocytopenia ([place])   . Weakness of extremity 06/2017   left arm    Past Surgical History:  Procedure Laterality Date  . CARDIAC CATHETERIZATION    .  CARDIAC PACEMAKER PLACEMENT    . PERMANENT PACEMAKER GENERATOR CHANGE N/A 06/12/2012   Procedure: PERMANENT PACEMAKER GENERATOR CHANGE;  Surgeon: Evans Lance, MD;  Location: [facility] CATH LAB;  Service: Cardiovascular;  Laterality: N/A;  . RETINAL DETACHMENT SURGERY      Family History  Problem Relation Age of Onset  . Heart disease Father   . Stroke Father   . Heart attack Father   . Heart disease Brother   . Heart disease Brother   . Hypertension Mother   . Diabetes Sister   . Heart attack Sister          Social History:  reports that she has never smoked. She has never used smokeless tobacco. She reports that she does not drink alcohol or use drugs.  Allergies:  Allergies  Allergen Reactions  . Amiodarone Other (See Comments)    Caused thyroid problems  . Amoxicillin Hives  . Aspirin Other (See Comments)    On tikosyn and warfarin  . Codeine Other (See Comments)    Causes BP to drop  . Darvocet [Propoxyphene N-Acetaminophen] Hives  . Demerol Itching  . Penicillins Hives    Medications:                                                                                                                           Prior to Admission:  Medications Prior to Admission  Medication Sig Dispense Refill Last Dose  .  dofetilide (TIKOSYN) 250 MCG capsule Take 1 capsule (250 mcg total) by mouth 2 (two) times daily. 180 capsule 1 06/19/2017 at Unknown time  . fludrocortisone (FLORINEF) 0.1 MG tablet Take 1 tablet (0.1 mg total) by mouth 2 (two) times daily. 180 tablet 1 06/19/2017 at Unknown time  . potassium chloride SA (K-DUR,KLOR-CON) 20 MEQ tablet Take 2 tablets (40 mEq total) by mouth daily. 180 tablet 1 Past Month at Unknown time  . prednisoLONE acetate (PRED FORTE) 1 % ophthalmic suspension PLACE 1 DROP IN BOTH EYES 4 TIMES DAILY  0 06/19/2017 at Unknown time  . warfarin (COUMADIN) 4 MG tablet Take 1/2 to 1 tablet by mouth  daily or as directed by  coumadin clinic (Patient taking differently: Take 4 mg by mouth See admin instructions. Take 4mg  tab on  Monday and Friday,  1/2 tablet (2mg ) on Sunday, Tuesday, Wednesday, Thursday and Saturday.) 90 tablet 1 06/19/2017 at Unknown time   Scheduled: . aspirin EC  81 mg Oral Daily  . atorvastatin  40 mg Oral q1800  . dofetilide  250 mcg Oral BID  . fludrocortisone  0.1 mg Oral BID  . potassium chloride SA  40 mEq Oral Daily  . prednisoLONE acetate  1 drop Both Eyes QID  . Warfarin - Pharmacist Dosing Inpatient   Does not apply q1800   Continuous:   ROS:  History obtained from the patient  General ROS: negative for - chills, fatigue, fever, night sweats, weight gain or weight loss Psychological ROS: negative for - , hallucinations, memory difficulties, mood swings or  Ophthalmic ROS: Positive for -blind at baseline ENT ROS: negative for - epistaxis, nasal discharge, oral lesions, sore throat, tinnitus or vertigo Respiratory ROS: negative for - cough,  shortness of breath or wheezing Cardiovascular ROS: negative for - chest pain, dyspnea on exertion,  Gastrointestinal ROS: negative for - abdominal pain, diarrhea,  nausea/vomiting  or stool incontinence Genito-Urinary ROS: negative for - dysuria, hematuria, incontinence or urinary frequency/urgency Musculoskeletal ROS: negative for - joint swelling or muscular weakness Neurological ROS: as noted in HPI   General Examination:                                                                                                      Blood pressure (!) 146/73, pulse 63, temperature 98 F (36.7 C), temperature source Oral, resp. rate 16, height 5\' 4"  (1.626 m), weight 49.9 kg (110 lb), SpO2 100 %.  HEENT-  Normocephalic, no lesions, without obvious abnormality.  Normal external eye and conjunctiva.   Cardiovascular-  pulses palpable throughout   Lungs-no rhonchi or wheezing noted, no excessive working breathing.  Saturations within normal limits Abdomen- All 4 quadrants palpated and nontender Extremities- Warm, dry and intact Musculoskeletal-no joint tenderness, deformity or swelling Skin-warm and dry, no hyperpigmentation, vitiligo, or suspicious lesions  Neurological Examination Mental Status: Alert, oriented, thought content appropriate.  Speech fluent without evidence of aphasia.  Able to follow 3 step commands without difficulty. Cranial Nerves: II: Left eye is blind at baseline, right eye is able to see shadows.  She does have an afferent pupillary defect in her left eye III,IV, VI: ptosis not present, extra-ocular motions intact bilaterally--rest left eye had a exotropia, when looking to the right left eye did not cross midline.  This was due to secondary to a surgical defect., pupils equal, round, reactive to light and accommodation-as noted APD on the left eye V,VII: smile symmetric, facial light touch sensation normal bilaterally VIII: hearing normal bilaterally IX,X: uvula rises symmetrically XI: bilateral shoulder shrug XII: midline tongue extension Motor: Right side had 5/5 strength, left side had 4 4/5 strength with significant weakness and tricep extension and  knee flexion. Sensory: Pinprick and light touch decreased on the left side.  She did show extinction on the left side. Deep Tendon Reflexes: 2+ and symmetric throughout Plantars: Right: downgoing   Left: downgoing Cerebellar: No dysmetria noted in arms and legs Gait: Not tested   Lab Results: Basic Metabolic Panel: Recent Labs  Lab 06/19/17 1446 06/20/17 0800  NA 141 140  K 4.0 4.2  CL 108 110  CO2 24 23  GLUCOSE 100* 90  BUN 24* 18  CREATININE 1.09* 0.98  CALCIUM 8.4* 8.4*    CBC: Recent Labs  Lab 06/19/17 1446 06/20/17 0800  WBC 4.8 4.9  HGB 13.3 13.5  HCT 40.2 41.5  MCV 95.9 97.2  PLT 120* PENDING    Lipid Panel: Recent Labs  Lab 06/20/17 0800  CHOL 206*  TRIG 90  HDL 54  CHOLHDL 3.8  VLDL 18  LDLCALC 134*    CBG: No results for input(s): GLUCAP in the last 168 hours.  Imaging: Dg Chest 2 View  Result Date: 06/19/2017 CLINICAL DATA:  84 y/o  F; left-sided weakness for 1 month. EXAM: CHEST - 2 VIEW COMPARISON:  12/12/2015 chest radiograph FINDINGS: Normal cardiac silhouette. Aortic atherosclerosis with calcification. Two lead pacemaker. No focal consolidation. No pleural effusion or pneumothorax. No acute osseous abnormality is evident. IMPRESSION: No acute pulmonary process identified.  Aortic atherosclerosis. Electronically Signed   By: Kristine Garbe M.D.   On: 06/19/2017 17:48   Ct Head Wo Contrast  Result Date: 06/19/2017 CLINICAL DATA:  Intermittent left-sided weakness for 1 month EXAM: CT HEAD WITHOUT CONTRAST TECHNIQUE: Contiguous axial images were obtained from the base of the skull through the vertex without intravenous contrast. COMPARISON:  Head CT 12/25/2016 FINDINGS: Brain: No mass lesion, intraparenchymal hemorrhage or extra-axial collection. No evidence of acute cortical infarct. Normal appearance of the brain parenchyma and extra axial spaces for age. Vascular: No hyperdense vessel or unexpected vascular calcification. Skull:  Normal visualized skull base, calvarium and extracranial soft tissues. Sinuses/Orbits: No sinus fluid levels or advanced mucosal thickening. No mastoid effusion. Normal orbits. IMPRESSION: Normal aging brain. Electronically Signed   By: Ulyses Jarred M.D.   On: 06/19/2017 17:34    Assessment and plan discussed with with attending physician and they are in agreement.    Etta Quill PA-C [facility] [phone]  06/20/2017, 12:03 PM   Attending addendum Patient seen and examined for left-sided weakness of more than 24 hours duration.  Agree with the history documented by Theodosia Paling PA-C above.  Please see above for detailed exam.  Briefly my exam below. On examination, patient is blind in both eyes with only light perception.  She has no visual field cuts.  Her pupils still react sluggishly bilaterally. Motor exam-left hemiparesis. Sensory exam-decreased left-sided sensation with possible extinction to double simultaneous stimulation Coronation: Intact finger-nose-finger Gait testing was deferred at this time CT scan of the head on admission was reviewed by me independently -no acute changes. Unable to obtain MRI of the brain due to incompatible pacemaker.  Assessment: 84 y.o. female venting with new onset left-sided weakness in the arm and leg along with decreased sensation in the face arm and leg.  Unfortunately patient has a pacemaker and is unable to have a MRI.  CT of head did not show any acute infarct, bleed, mass.  Likely a right parietal lobe infarct secondary to extinction on the left side.  Will need to have a repeat CT in a couple of days to see if any hypodensity is seen in evolution.  Stroke Risk Factors - hypertension  Recommend --HgbA1c, fasting lipid panel --Repeat CT in 2-3 days.  If capable CTA of head and neck. --PT consult, OT consult, Speech consult --Echocardiogram --80 mg of Atorvistatin --Prophylactic therapy--she has a stable clinical exam and an  unremarkable CT of the head.  It is okay to continue with Coumadin for now.  Stroke team to make decision on switching to a possible no anticoagulant considering this to be a failure of Coumadin. --Telemetry monitoring --Frequent neuro checks --NPO until passes stroke swallow screen --please page stroke NP  Or  PA  Or MD from 8am -4 pm  as this patient from this time will be  followed by the stroke.   You can look  them up on [email or web address]  Password [email or web address]  -- Amie Portland, MD [facility] Pager: [phone] If 7pm to 7am, please call on call as listed on AMION.

## 2017-06-20 NOTE — Progress Notes (Signed)
Pt arrived A&Ox4, denies pain, moves all extremities, oriented to staff and unit, call bell within reach, cardiac monitor placed and CCMT notified and verified. Initial assessment complete will continue to monitor

## 2017-06-21 ENCOUNTER — Inpatient Hospital Stay (HOSPITAL_COMMUNITY): Payer: Medicare Other

## 2017-06-21 LAB — PROTIME-INR
INR: 2.09
Prothrombin Time: 23.3 s — ABNORMAL HIGH (ref 11.4–15.2)

## 2017-06-21 MED ORDER — WARFARIN SODIUM 4 MG PO TABS
4.0000 mg | ORAL_TABLET | Freq: Once | ORAL | Status: AC
  Administered 2017-06-21: 4 mg via ORAL
  Filled 2017-06-21: qty 1

## 2017-06-21 MED ORDER — IOPAMIDOL (ISOVUE-370) INJECTION 76%
INTRAVENOUS | Status: AC
  Administered 2017-06-21: 50 mL
  Filled 2017-06-21: qty 50

## 2017-06-21 NOTE — Consult Note (Signed)
Physical Medicine and Rehabilitation Consult Reason for Consult: Left side weakness Referring Physician: Family medicine   HPI: Katrina Rodriguez is a 84 y.o. right-handed female with history of blindness left eye and very poor vision on the right related to retinal detachment, orthostatic hypotension maintained on Florinef, atrial fibrillation status post ablation maintained on Tikosyn and chronic Coumadin as well as history of CVA with chronic left-sided weakness.  Per chart review patient lives alone.  Used a walker prior to admission.  Two-level level home with 3 steps to entry and has a chair lift.  Daughter in the area works.  Presented 06/19/2017 with increasing left-sided weakness.  Cranial CT scan negative for acute changes.  Chest x-ray negative.  Troponin negative.  Carotid Dopplers with no ICA stenosis.  Echocardiogram with ejection fraction of 65% no wall motion abnormalities.  CT angiogram of head and neck with no proximal stenosis or large vessel occlusion.  INR on admission of 2.13 and remains on Coumadin.  Tolerating a regular diet.  Physical therapy evaluation completed with recommendations of physical medicine rehab consult.  Pt states she has had Left sided weakness for several years after a prior CVA, had MRI in 2003 for symptoms of Left arm weakness Also has hx of chronic left ankle weakness and limited motion since childhood but having more problems with LLE weakness  Review of Systems  Constitutional: Negative for chills and fever.  HENT: Negative for hearing loss.   Eyes:       Blindness  Respiratory: Negative for cough and shortness of breath.   Cardiovascular: Positive for palpitations.  Gastrointestinal: Positive for constipation. Negative for nausea.  Genitourinary: Negative for dysuria, flank pain and hematuria.  Musculoskeletal: Positive for myalgias.  Neurological: Positive for dizziness.       Syncope  All other systems reviewed and are negative.  Past  Medical History:  Diagnosis Date  . Anticoagulant long-term use   . Arthritis   . Atrial fibrillation (HCC)    History of ablation - Dr. Rayann Heman  . Chest pain    Has normal coronaries per cath in 2005; negative nuclear in 2010  . Chronic anemia   . H/O syncope   . H/O: CVA (cardiovascular accident)    Right internal capsule  . High risk medication use    Tikosyn & Coumadin  . Hyperthyroidism   . Mild pulmonary hypertension ([place])   . 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 Orthostasis   . Pacemaker   . Retinal detachment   . Tachycardia-bradycardia syndrome ([place])   . Thrombocytopenia ([place])   . Weakness of extremity 06/2017   left arm   Past Surgical History:  Procedure Laterality Date  . CARDIAC CATHETERIZATION    . CARDIAC PACEMAKER PLACEMENT    . PERMANENT PACEMAKER GENERATOR CHANGE N/A 06/12/2012   Procedure: PERMANENT PACEMAKER GENERATOR CHANGE;  Surgeon: Evans Lance, MD;  Location: [facility] CATH LAB;  Service: Cardiovascular;  Laterality: N/A;  . RETINAL DETACHMENT SURGERY     Family History  Problem Relation Age of Onset  . Heart disease Father   . Stroke Father   . Heart attack Father   . Heart disease Brother   . Heart disease Brother   . Hypertension Mother   . Diabetes Sister   . Heart attack Sister    Social History:  reports that she has never smoked. She has never used smokeless tobacco. She reports that she does not drink alcohol or use  drugs. Allergies:  Allergies  Allergen Reactions  . Amiodarone Other (See Comments)    Caused thyroid problems  . Amoxicillin Hives  . Aspirin Other (See Comments)    On tikosyn and warfarin  . Codeine Other (See Comments)    Causes BP to drop  . Darvocet [Propoxyphene N-Acetaminophen] Hives  . Demerol Itching  . Penicillins Hives   Medications Prior to Admission  Medication Sig Dispense Refill  . dofetilide (TIKOSYN) 250 MCG capsule Take 1 capsule (250 mcg total) by mouth 2 (two) times daily. 180 capsule 1  .  fludrocortisone (FLORINEF) 0.1 MG tablet Take 1 tablet (0.1 mg total) by mouth 2 (two) times daily. 180 tablet 1  . potassium chloride SA (K-DUR,KLOR-CON) 20 MEQ tablet Take 2 tablets (40 mEq total) by mouth daily. 180 tablet 1  . prednisoLONE acetate (PRED FORTE) 1 % ophthalmic suspension PLACE 1 DROP IN BOTH EYES 4 TIMES DAILY  0  . warfarin (COUMADIN) 4 MG tablet Take 1/2 to 1 tablet by mouth  daily or as directed by  coumadin clinic (Patient taking differently: Take 4 mg by mouth See admin instructions. Take 4mg  tab on  Monday and Friday,  1/2 tablet (2mg ) on Sunday, Tuesday, Wednesday, Thursday and Saturday.) 90 tablet 1    Home: Home Living Family/patient expects to be discharged to:: Private residence Living Arrangements: Alone Type of Home: House Home Access: Stairs to enter Technical brewer of Steps: 3(has lift chair) Entrance Stairs-Rails: Can reach both Home Layout: One level Bathroom Shower/Tub: Tub/shower unit(deep tub, typically sponge bathes but likes to soak in tub occasionally) Bathroom Toilet: Handicapped height Home Equipment: Environmental consultant - 2 wheels, [place] - single point, Shower seat(lift chair) Additional Comments: She reports having a friend/church member that does her housekeeping and is present to assist with meal prep.  Reports friends/church members take her to the grocery store and the doctor.  Functional History: Prior Function Level of Independence: Independent with assistive device(s) Functional Status:  Mobility: Bed Mobility Overal bed mobility: Needs Assistance Bed Mobility: Supine to Sit, Sit to Supine Supine to sit: Supervision Sit to supine: Supervision General bed mobility comments: Supervision for safety, increased time and effort to perform Transfers Overall transfer level: Needs assistance Equipment used: Rolling walker (2 wheeled), 1 person hand held assist Transfers: Sit to/from Stand, Stand Pivot Transfers Sit to Stand: Min assist Stand  pivot transfers: Min assist General transfer comment: assist to steady; pt with posterior LOB  Ambulation/Gait Ambulation/Gait assistance: Min assist Ambulation Distance (Feet): 80 Feet Assistive device: Rolling walker (2 wheeled) Gait Pattern/deviations: Decreased stride length, Shuffle, Step-through pattern, Drifts right/left General Gait Details: slow cadence; cues for increased bilat step lengths Gait velocity: decreased    ADL: ADL Overall ADL's : Needs assistance/impaired Upper Body Bathing: Minimal assistance, Standing Lower Body Bathing: Moderate assistance, Sit to/from stand Upper Body Dressing : Minimal assistance, Standing Lower Body Dressing: Moderate assistance, Sit to/from stand Toilet Transfer: Minimal assistance, BSC Functional mobility during ADLs: Minimal assistance, Moderate assistance General ADL Comments: required mod (lifting) assist sit > stand initially fading to min assist, also required mod assist with LOB backwards in standing  Cognition: Cognition Overall Cognitive Status: Within Functional Limits for tasks assessed Orientation Level: Oriented X4 Cognition Arousal/Alertness: Awake/alert Behavior During Therapy: WFL for tasks assessed/performed Overall Cognitive Status: Within Functional Limits for tasks assessed  Blood pressure (!) 119/59, pulse 60, temperature 98.2 F (36.8 C), temperature source Oral, resp. rate 16, height 5\' 4"  (1.626 m), weight 49.9 kg (110  lb), SpO2 96 %. Physical Exam  Vitals reviewed. Constitutional: She is oriented to person, place, and time.  HENT:  Head: Normocephalic.  Eyes:  Patient is essentially blind she can see images on the right  Neck: Normal range of motion. Neck supple. No thyromegaly present.  Cardiovascular:  Cardiac rate controlled  Respiratory: Effort normal and breath sounds normal. No respiratory distress.  GI: Soft. Bowel sounds are normal. She exhibits no distension.  Neurological: She is alert and  oriented to person, place, and time.  Follows commands.  Exam limited due to poor vision  Skin: Skin is warm and dry.  Motor strength is 4/5 in left deltoid bicep tricep grip hip flexor knee extensor 3- at the ankle she has decreased ankle inversion and eversion limited range of motion  Results for orders placed or performed during the hospital encounter of 06/19/17 (from the past 24 hour(s))  Protime-INR     Status: Abnormal   Collection Time: 06/21/17  4:38 AM  Result Value Ref Range   Prothrombin Time 23.3 (H) 11.4 - 15.2 seconds   INR 2.09    Dg Chest 2 View  Result Date: 06/19/2017 CLINICAL DATA:  84 y/o  F; left-sided weakness for 1 month. EXAM: CHEST - 2 VIEW COMPARISON:  12/12/2015 chest radiograph FINDINGS: Normal cardiac silhouette. Aortic atherosclerosis with calcification. Two lead pacemaker. No focal consolidation. No pleural effusion or pneumothorax. No acute osseous abnormality is evident. IMPRESSION: No acute pulmonary process identified.  Aortic atherosclerosis. Electronically Signed   By: Kristine Garbe M.D.   On: 06/19/2017 17:48   Ct Head Wo Contrast  Result Date: 06/19/2017 CLINICAL DATA:  Intermittent left-sided weakness for 1 month EXAM: CT HEAD WITHOUT CONTRAST TECHNIQUE: Contiguous axial images were obtained from the base of the skull through the vertex without intravenous contrast. COMPARISON:  Head CT 12/25/2016 FINDINGS: Brain: No mass lesion, intraparenchymal hemorrhage or extra-axial collection. No evidence of acute cortical infarct. Normal appearance of the brain parenchyma and extra axial spaces for age. Vascular: No hyperdense vessel or unexpected vascular calcification. Skull: Normal visualized skull base, calvarium and extracranial soft tissues. Sinuses/Orbits: No sinus fluid levels or advanced mucosal thickening. No mastoid effusion. Normal orbits. IMPRESSION: Normal aging brain. Electronically Signed   By: Ulyses Jarred M.D.   On: 06/19/2017 17:34     Assessment/Plan: Diagnosis: Chronic left hemiparesis as well as left ankle contracture secondary to remote stroke and remote fracture left ankle 1. Does the need for close, 24 hr/day medical supervision in concert with the patient's rehab needs make it unreasonable for this patient to be served in a less intensive setting? No 2. Co-Morbidities requiring supervision/potential complications: History of orthostatic hypotension atrial fibrillation status post ablation 3. Due to bladder management, bowel management, safety, skin/wound care, disease management, medication administration, pain management and patient education, does the patient require 24 hr/day rehab nursing? No 4. Does the patient require coordinated care of a physician, rehab nurse, PT, OT to address physical and functional deficits in the context of the above medical diagnosis(es)? No Addressing deficits in the following areas: balance, endurance, locomotion, strength, transferring, bowel/bladder control, bathing and dressing 5. Can the patient actively participate in an intensive therapy program of at least 3 hrs of therapy per day at least 5 days per week? N/A 6. The potential for patient to make measurable gains while on inpatient rehab is NA 7. Anticipated functional outcomes upon discharge from inpatient rehab are n/a  with PT, n/a with OT, n/a with SLP.  8. Estimated rehab length of stay to reach the above functional goals is: NA 9. Anticipated D/C setting: Home 10. Anticipated post D/C treatments: [place] therapy 11. Overall Rehab/Functional Prognosis: fair  RECOMMENDATIONS: This patient's condition is appropriate for continued rehabilitative care in the following setting: Home health with additional supervision Patient has agreed to participate in recommended program. N/A Note that insurance prior authorization may be required for reimbursement for recommended care.  Comment: Pt has chronic mobility issues complicated by  severe visual impairment without new CVA. Will need longer term change of living situation, brother in ALF, pt may consider.   "I have personally performed a face to face diagnostic evaluation of this patient.  Additionally, I have reviewed and concur with the physician assistant's documentation above."  Charlett Blake M.D. [facility] FAAPM&R (Sports Med, Neuromuscular Med) Diplomate Am Board of Electrodiagnostic Med  Elizabeth Sauer 06/21/2017

## 2017-06-21 NOTE — Progress Notes (Addendum)
STROKE TEAM PROGRESS NOTE   INTERVAL HISTORY No family is at the bedside.  Patient lying in bed. Talking on the phone. No new complaints. Vision still decreased in R eye.   CBC:  CBC Latest Ref Rng & Units 06/20/2017 06/19/2017 12/12/2015  WBC 4.0 - 10.5 K/uL 4.9 4.8 5.8  Hemoglobin 12.0 - 15.0 g/dL 13.5 13.3 13.5  Hematocrit 36.0 - 46.0 % 41.5 40.2 41.3  Platelets 150 - 400 K/uL 114(L) 120(L) 139(L)     Comprehensive Metabolic Panel:   CMP Latest Ref Rng & Units 06/20/2017 06/19/2017 12/14/2016  Glucose 65 - 99 mg/dL 90 100(H) 82  BUN 6 - 20 mg/dL 18 24(H) 19  Creatinine 0.44 - 1.00 mg/dL 0.98 1.09(H) 1.24(H)  Sodium 135 - 145 mmol/L 140 141 146(H)  Potassium 3.5 - 5.1 mmol/L 4.2 4.0 4.0  Chloride 101 - 111 mmol/L 110 108 105  CO2 22 - 32 mmol/L 23 24 23   Calcium 8.9 - 10.3 mg/dL 8.4(L) 8.4(L) 8.9  Total Protein 6.0 - 8.5 g/dL - - 6.4  Total Bilirubin 0.0 - 1.2 mg/dL - - 0.7  Alkaline Phos 39 - 117 IU/L - - 64  AST 0 - 40 IU/L - - 20  ALT 0 - 32 IU/L - - 7   Lipid Panel:     Component Value Date/Time   CHOL 206 (H) 06/20/2017 0800   TRIG 90 06/20/2017 0800   HDL 54 06/20/2017 0800   CHOLHDL 3.8 06/20/2017 0800   VLDL 18 06/20/2017 0800   LDLCALC 134 (H) 06/20/2017 0800   HgbA1c:  Lab Results  Component Value Date   HGBA1C 5.8 (H) 06/12/2012    Vitals:   06/20/17 1950 06/20/17 2316 06/21/17 0318 06/21/17 0838  BP: (!) 150/66 131/66 (!) 120/54 (!) 143/73  Pulse: 62 60 (!) 56 71  Resp: 18 20 18 19   Temp: 98 F (36.7 C) 98 F (36.7 C) 98.2 F (36.8 C) (!) 97.1 F (36.2 C)  TempSrc: Oral Oral Oral Oral  SpO2: 99% 96% 97% 99%  Weight:      Height:       HEENT-  Normocephalic, no lesions, without obvious abnormality.  Normal external eye and conjunctiva.   Cardiovascular-  pulses palpable throughout HR IR Lungs-no rhonchi or wheezing noted, no excessive working breathing.  Saturations within normal limits Extremities- Warm, dry and intact Musculoskeletal-no joint  tenderness, deformity or swelling Skin-warm and dry, no hyperpigmentation, vitiligo, or suspicious lesions  Neurological Examination Mental Status: Alert, oriented x 3, situation. thought content appropriate.  Speech fluent without evidence of aphasia.  Able to follow 3 step commands without difficulty. Able to name objects. Good stereognosis.  Cranial Nerves: II: Left eye is blind at baseline, right eye is able to see shadows.  She does have an afferent pupillary defect in her left eye III,IV, VI: ptosis not present, extra-ocular motions intact bilaterally--rest left eye had a exotropia, when looking to the right left eye did not cross midline.  This was due to secondary to a surgical defect., pupils equal, round, reactive to light and accommodation-as noted APD on the left eye V,VII: smile symmetric, facial light touch sensation normal bilaterally VIII: hearing normal bilaterally IX,X: uvula rises symmetrically XI: bilateral shoulder shrug XII: midline tongue extension Motor: Right side had 5/5 strength, left side had 4 4/5 strength with significant weakness and tricep extension and knee flexion. Sensory: Pinprick and light touch decreased on the left side.  She did show extinction on the left  side. Deep Tendon Reflexes: 2+ and symmetric throughout Plantars: Right: downgoing                                Left: downgoing Cerebellar: No dysmetria noted in arms and legs Gait: Not tested  No significant change in exam since yesterday  ASSESSMENT/PLAN [name] is a 84 y.o. female with history of prior stroke w/ L HP,  AF s/p ablation on warfarin, blind in L eye, shadows in R eye, CHB, orthostatic hypotension presenting with decreased sensation and increased hemiparesis on her L side.   Stroke vs TIA, workup underway. Likely embolic given known atrial fibrillation   Resultant L HP requiring therapy   CT head normal aging brain  CTA head pending (to confirm/refute stroke, look  at vasculature)  MRI  / MRA  pacer  Carotid Doppler  B ICA 1-39% stenosis, VAs antegrade   2D Echo  EF 60-65%. No source of embolus   LDL 134  HgbA1c not ordered  Warfarin for VTE prophylaxis  Diet regular Room service appropriate? Yes; Fluid consistency: Thin  warfarin daily prior to admission, now on aspirin 81 mg daily and warfarin daily. Continue warfarin at d/c  Therapy recommendations:  CIR. Consulted  Disposition:  Pending (lives in a condo alone, dtr nearby and supportive)  Follow[facility] by [facility] [name] at [facility] P[facility]  Atrial Fibrillation  Home anticoagulation:  warfarin daily continued in the hospital  INR 2.22 on admission, now 2.12  On tikosyn  Has tried xarelto and eliquis in the past, both of which she is allergic to.   Will continue warfarin at discharge.   Continue aspirin with warfarin only if CTA shows significant atherosclerosis.   Hypertension  Stable .  Permissive hypertension (OK if < 220/120) but gradually normalize in 5-7 days .  Long-term BP goal normotensive  Hyperlipidemia  Home meds:  None  Now on lipitor 40  LDL 134, goal < 70  Continue statin at discharge  Other Stroke Risk Factors  Advanced age  UDS / ETOH level not performed   Hx stroke/TIA  R PLIC stroke with L HP  Family hx stroke (father)  CHB w/ pacer x 14y, new battery 4 yrs ago  Other Active Problems  Chest pain, resolved  Orthostatic hypotension on florineg  Optic atrophy - Progressive R eye vision loss since 2013, optic neuropathy   Gait d/o  Hx Vit B12 deficiency  Hospital day # [place], MSN, APRN, ANVP-BC, AGPCNP-BC Advanced Practice Stroke Nurse [place] for Schedule & Pager information 06/21/2017 1:47 PM  I have personally examined this patient, reviewed notes, independently viewed imaging studies, participated in medical decision making and plan of care.ROS completed by me personally and pertinent positives fully  documented  I have made any additions or clarifications directly to the above note. Agree with note above. She presented with a TIA likely scarred embolic etiology from atrial fibrillation despite optimal anticoagulation. Patient states she has not tolerated Xarelto on eliquis in the past and would like to stay on warfarin Continue ongoing stroke workup and discharge and work up completed. Greater than 50% and in this 25 minute visit was spent on counseling and coordination of care about afib and stroke risk. Stroke team will sign off. Kindly call for questions. Antony Contras, MD Medical Director [facility] Pager: [phone] 06/21/2017 3:17 PM  To contact Stroke  Continuity provider, please refer to [email or web address] After hours, contact General Neurology

## 2017-06-21 NOTE — Progress Notes (Signed)
Physical Therapy Treatment Patient Details Name: Katrina Rodriguez MRN: [id number] DOB: [birth year] Today's Date: 06/21/2017    History of Present Illness 84 y.o. female with history of atrial fibrillation, status post ablation and on Tikosyn, history of CVA, chronic left-sided weakness, history of syncopal episodes, has a pacemaker, anticoagulated, on Coumadin, chronic anemia, presents to emergency department with complaint of left-sided weakness    PT Comments    Patient is making progress toward mobility goals. Pt continues to demonstrate balance deficits and is easily distracted. Current plan remains appropriate.    Follow Up Recommendations  CIR;Supervision/Assistance - 24 hour     Equipment Recommendations  (TBD)    Recommendations for Other Services       Precautions / Restrictions Precautions Precautions: Fall    Mobility  Bed Mobility Overal bed mobility: Needs Assistance Bed Mobility: Supine to Sit;Sit to Supine     Supine to sit: Supervision Sit to supine: Supervision   General bed mobility comments: Supervision for safety, increased time and effort to perform  Transfers Overall transfer level: Needs assistance Equipment used: Rolling walker (2 wheeled);1 person hand held assist Transfers: Sit to/from [employer] Transfers Sit to Stand: Min assist         General transfer comment: assist to steady; pt with posterior LOB   Ambulation/Gait Ambulation/Gait assistance: Min assist Ambulation Distance (Feet): 80 Feet Assistive device: Rolling walker (2 wheeled) Gait Pattern/deviations: Decreased stride length;Shuffle;Step-through pattern;Drifts right/left Gait velocity: decreased   General Gait Details: slow cadence; cues for increased bilat step lengths   Stairs             Wheelchair Mobility    Modified Rankin (Stroke Patients Only) Modified Rankin (Stroke Patients Only) Pre-Morbid Rankin Score: Moderate disability Modified Rankin:  Moderately severe disability     Balance Overall balance assessment: Needs assistance Sitting-balance support: Feet supported Sitting balance-Leahy Scale: Fair     Standing balance support: During functional activity;Bilateral upper extremity supported Standing balance-Leahy Scale: Poor                              Cognition Arousal/Alertness: Awake/alert Behavior During Therapy: WFL for tasks assessed/performed Overall Cognitive Status: Within Functional Limits for tasks assessed                                        Exercises      General Comments        Pertinent Vitals/Pain Pain Assessment: Faces Faces Pain Scale: Hurts little more Pain Location: L ankle/foot Pain Descriptors / Indicators: Sore;Guarding Pain Intervention(s): Limited activity within patient's tolerance;Monitored during session;Repositioned    Home Living                      Prior Function            PT Goals (current goals can now be found in the care plan section) Acute Rehab PT Goals Patient Stated Goal: to go back home PT Goal Formulation: With patient Time For Goal Achievement: 07/04/17 Potential to Achieve Goals: Good Progress towards PT goals: Progressing toward goals    Frequency    Min 4X/week      PT Plan Current plan remains appropriate    Co-evaluation              AM-PAC PT "6 Clicks" Daily Activity  Outcome Measure  Difficulty turning over in bed (including adjusting bedclothes, sheets and blankets)?: A Little Difficulty moving from lying on back to sitting on the side of the bed? : A Little Difficulty sitting down on and standing up from a chair with arms (e.g., wheelchair, bedside commode, etc,.)?: Unable Help needed moving to and from a bed to chair (including a wheelchair)?: A Little Help needed walking in hospital room?: A Lot Help needed climbing 3-5 steps with a railing? : A Little 6 Click Score: 15    End of  Session Equipment Utilized During Treatment: Gait belt Activity Tolerance: Patient tolerated treatment well Patient left: in bed;with call bell/phone within reach;with bed alarm set Nurse Communication: Mobility status PT Visit Diagnosis: Unsteadiness on feet (R26.81);Muscle weakness (generalized) (M62.81);Difficulty in walking, not elsewhere classified (R26.2)     Time: 2774-1287 PT Time Calculation (min) (ACUTE ONLY): 36 min  Charges:  $Gait Training: 23-37 mins                    G Codes:       Earney Navy, PTA Pager: [phone]     Darliss Cheney 06/21/2017, 10:03 AM

## 2017-06-21 NOTE — Progress Notes (Signed)
PROGRESS NOTE    Katrina Rodriguez  YQI:[id number] DOB: 04-07-1928 DOA: 06/19/2017 PCP: Carol Ada, MD   Outpatient Specialists:  [name] (cards) [name] (neuro)   Brief Narrative:  84y/o female who lives alone she has a h/o CVA with left sided stable weakness. she states she has been feeling weaker the last week or so but today she had definite worsened LUE and LLE weakness that has persisted all day.   Assessment & Plan:   Active Problems:   PAROXYSMAL ATRIAL FIBRILLATION   Complete heart block (HCC)   Hypothyroidism   Left-sided weakness   Left sided weakness- worse than baseline -tele- paced -neuro consult-- await recommendations for further imaging as unable to have MRI-- CTA vs repeat CT -based on symptoms, concern for posterior circulation CVA -PT, OT, swallow evaluation- CIR -baby ASA daily -warfarin -LDL > 70-- added statin (patient refusing as Dr. [name] says she does not have cholesterol issues) -echo/carotid  chest pain - 2D echo ordered and pending -resolved -CE negative  Orthostatic hypotension -continue florinef   Complete heart block ([place]) -Permanent pacemaker in place >76yrs old  PAROXYSMAL ATRIAL FIBRILLATION -continue tikosyn and coumadin. Pharmacy consult for dosing     DVT prophylaxis:  Fully anticoagulated   Code Status: DNR   Family Communication: Called daughter and left message  Disposition Plan:  Pending-- not safe to go home   Consultants:   neuro   Subjective: Willing to go to CIR if needed  Objective: Vitals:   06/20/17 2316 06/21/17 0318 06/21/17 0838 06/21/17 1231  BP: 131/66 (!) 120/54 (!) 143/73 (!) 119/59  Pulse: 60 (!) 56 71 60  Resp: 20 18 19 16   Temp: 98 F (36.7 C) 98.2 F (36.8 C) (!) 97.1 F (36.2 C) 98.2 F (36.8 C)  TempSrc: Oral Oral Oral Oral  SpO2: 96% 97% 99% 96%  Weight:      Height:       No intake or output data in the 24 hours ending 06/21/17 1320 Filed Weights   06/19/17 1424   Weight: 49.9 kg (110 lb)    Examination:  General exam: NAD, just finished her breakfast Respiratory system: clear Cardiovascular system: irr Gastrointestinal system: +Bs, soft Central nervous system: alert Extremities: left sided weakness    Data Reviewed: I have personally reviewed following labs and imaging studies  CBC: Recent Labs  Lab 06/19/17 1446 06/20/17 0800  WBC 4.8 4.9  HGB 13.3 13.5  HCT 40.2 41.5  MCV 95.9 97.2  PLT 120* 387*   Basic Metabolic Panel: Recent Labs  Lab 06/19/17 1446 06/20/17 0800  NA 141 140  K 4.0 4.2  CL 108 110  CO2 24 23  GLUCOSE 100* 90  BUN 24* 18  CREATININE 1.09* 0.98  CALCIUM 8.4* 8.4*   GFR: Estimated Creatinine Clearance: 31.9 mL/min (by C-G formula based on SCr of 0.98 mg/dL). Liver Function Tests: No results for input(s): AST, ALT, ALKPHOS, BILITOT, PROT, ALBUMIN in the last 168 hours. No results for input(s): LIPASE, AMYLASE in the last 168 hours. No results for input(s): AMMONIA in the last 168 hours. Coagulation Profile: Recent Labs  Lab 06/19/17 1446 06/20/17 0800 06/21/17 0438  INR 2.26 2.13 2.09   Cardiac Enzymes: Recent Labs  Lab 06/20/17 0003 06/20/17 0800 06/20/17 1232  TROPONINI <0.03 <0.03 <0.03   BNP (last 3 results) No results for input(s): PROBNP in the last 8760 hours. HbA1C: No results for input(s): HGBA1C in the last 72 hours. CBG: No results for input(s):  GLUCAP in the last 168 hours. Lipid Profile: Recent Labs    06/20/17 0800  CHOL 206*  HDL 54  LDLCALC 134*  TRIG 90  CHOLHDL 3.8   Thyroid Function Tests: No results for input(s): TSH, T4TOTAL, FREET4, T3FREE, THYROIDAB in the last 72 hours. Anemia Panel: No results for input(s): VITAMINB12, FOLATE, FERRITIN, TIBC, IRON, RETICCTPCT in the last 72 hours. Urine analysis:    Component Value Date/Time   COLORURINE YELLOW 06/19/2017 1528   APPEARANCEUR CLEAR 06/19/2017 1528   LABSPEC 1.011 06/19/2017 1528   PHURINE 5.0  06/19/2017 1528   GLUCOSEU NEGATIVE 06/19/2017 1528   HGBUR MODERATE (A) 06/19/2017 1528   BILIRUBINUR NEGATIVE 06/19/2017 [place] 06/19/2017 1528   PROTEINUR NEGATIVE 06/19/2017 1528   NITRITE NEGATIVE 06/19/2017 1528   LEUKOCYTESUR NEGATIVE 06/19/2017 1528     )No results found for this or any previous visit (from the past 240 hour(s)).    Anti-infectives (From admission, onward)   None       Radiology Studies: Dg Chest 2 View  Result Date: 06/19/2017 CLINICAL DATA:  84 y/o  F; left-sided weakness for 1 month. EXAM: CHEST - 2 VIEW COMPARISON:  12/12/2015 chest radiograph FINDINGS: Normal cardiac silhouette. Aortic atherosclerosis with calcification. Two lead pacemaker. No focal consolidation. No pleural effusion or pneumothorax. No acute osseous abnormality is evident. IMPRESSION: No acute pulmonary process identified.  Aortic atherosclerosis. Electronically Signed   By: Kristine Garbe M.D.   On: 06/19/2017 17:48   Ct Head Wo Contrast  Result Date: 06/19/2017 CLINICAL DATA:  Intermittent left-sided weakness for 1 month EXAM: CT HEAD WITHOUT CONTRAST TECHNIQUE: Contiguous axial images were obtained from the base of the skull through the vertex without intravenous contrast. COMPARISON:  Head CT 12/25/2016 FINDINGS: Brain: No mass lesion, intraparenchymal hemorrhage or extra-axial collection. No evidence of acute cortical infarct. Normal appearance of the brain parenchyma and extra axial spaces for age. Vascular: No hyperdense vessel or unexpected vascular calcification. Skull: Normal visualized skull base, calvarium and extracranial soft tissues. Sinuses/Orbits: No sinus fluid levels or advanced mucosal thickening. No mastoid effusion. Normal orbits. IMPRESSION: Normal aging brain. Electronically Signed   By: Ulyses Jarred M.D.   On: 06/19/2017 17:34        Scheduled Meds: . aspirin EC  81 mg Oral Daily  . atorvastatin  40 mg Oral q1800  . dofetilide   250 mcg Oral BID  . fludrocortisone  0.1 mg Oral BID  . potassium chloride SA  40 mEq Oral Daily  . prednisoLONE acetate  1 drop Both Eyes QID  . Warfarin - Pharmacist Dosing Inpatient   Does not apply q1800   Continuous Infusions:   LOS: 1 day    Time spent: 25 min    Geradine Girt, DO [facility] Pager [phone]  If 7PM-7AM, please contact night-coverage [email or web address] Password [email or web address] 06/21/2017, 1:20 PM

## 2017-06-21 NOTE — Progress Notes (Signed)
ANTICOAGULATION CONSULT NOTE - Follow Up Consult  Pharmacy Consult for Coumadin Indication: atrial fibrillation and prior stroke  Allergies  Allergen Reactions  . Amiodarone Other (See Comments)    Caused thyroid problems  . Amoxicillin Hives  . Aspirin Other (See Comments)    On tikosyn and warfarin  . Codeine Other (See Comments)    Causes BP to drop  . Darvocet [Propoxyphene N-Acetaminophen] Hives  . Demerol Itching  . Eliquis [Apixaban] Nausea Only  . Penicillins Hives  . Xarelto [Rivaroxaban] Rash    Patient Measurements: Height: 5\' 4"  (162.6 cm) Weight: 110 lb (49.9 kg) IBW/kg (Calculated) : 54.7  Vital Signs: Temp: 98.2 F (36.8 C) (04/18 1231) Temp Source: Oral (04/18 1231) BP: 119/59 (04/18 1231) Pulse Rate: 60 (04/18 1231)  Labs: Recent Labs    06/19/17 1446 06/20/17 0003 06/20/17 0800 06/20/17 1232 06/21/17 0438  HGB 13.3  --  13.5  --   --   HCT 40.2  --  41.5  --   --   PLT 120*  --  114*  --   --   LABPROT 24.7*  --  23.6*  --  23.3*  INR 2.26  --  2.13  --  2.09  CREATININE 1.09*  --  0.98  --   --   TROPONINI  --  <0.03 <0.03 <0.03  --     Estimated Creatinine Clearance: 31.9 mL/min (by C-G formula based on SCr of 0.98 mg/dL).  Assessment:  84 yr old female continues on Coumadin for atrial fibrillation and prior stroke.  INR 2.26>2.13 after usual Coumadin dose of 2 mg on 4/16 prior to admission, then down again to 2.09 after increased dose of 3 mg on 4/17. Therapeutic, but trended down. ASA 81 mg daily added this admit.    Noted plan to continue Coumadin due to hx allergy to both Xarelto and Eliquis. Neither was listed as allergy; patient reports rash with Xarelto, nausea with Eliquis.   Home Coumadin regimen: 2 mg daily except 4 mg on Mondays and Fridays.  Goal of Therapy:  INR 2-3 Monitor platelets by anticoagulation protocol: Yes   Plan:   Coumadin 4 mg x 1 today, to try to avoid further INR decline.  Discussed with patient and  daughter.  Daily PT/INR.  Arty Baumgartner, [place] Pager: [phone] 06/21/2017,3:21 PM

## 2017-06-22 LAB — PROTIME-INR
INR: 2.28
Prothrombin Time: 24.9 s — ABNORMAL HIGH (ref 11.4–15.2)

## 2017-06-22 MED ORDER — WARFARIN SODIUM 4 MG PO TABS
4.0000 mg | ORAL_TABLET | Freq: Once | ORAL | Status: AC
  Administered 2017-06-22: 4 mg via ORAL
  Filled 2017-06-22: qty 1

## 2017-06-22 MED ORDER — WARFARIN SODIUM 5 MG PO TABS: 5.0000 mg | ORAL_TABLET | Freq: Once | ORAL | Status: DC

## 2017-06-22 NOTE — Progress Notes (Signed)
Physical Therapy Treatment Patient Details Name: Katrina Rodriguez MRN: [id number] DOB: Mar 21, 1928 Today's Date: 06/22/2017    History of Present Illness 84 y.o. female with history of atrial fibrillation, status post ablation and on Tikosyn, history of CVA, chronic left-sided weakness, history of syncopal episodes, has a pacemaker, anticoagulated, on Coumadin, chronic anemia, presents to emergency department with complaint of left-sided weakness    PT Comments    Patient tolerated session well. Pt required min A for STS transfer and gait training. Pt with noted increased L LE weakness while ambulating this am and reported aching and heaviness in L UE/LE. Current plan remains appropriate.   Follow Up Recommendations  CIR;Supervision/Assistance - 24 hour     Equipment Recommendations  (TBD)    Recommendations for Other Services       Precautions / Restrictions Precautions Precautions: Fall Restrictions Weight Bearing Restrictions: No    Mobility  Bed Mobility Overal bed mobility: Modified Independent Bed Mobility: Supine to Sit;Sit to Supine           General bed mobility comments: increased time and effort  Transfers Overall transfer level: Needs assistance Equipment used: Rolling walker (2 wheeled);1 person hand held assist Transfers: Sit to/from Stand Sit to Stand: Min assist         General transfer comment: assist to power up into standing; cues for safe hand placement  Ambulation/Gait Ambulation/Gait assistance: Min assist Ambulation Distance (Feet): 90 Feet Assistive device: Rolling walker (2 wheeled) Gait Pattern/deviations: Decreased stride length;Shuffle;Step-through pattern;Drifts right/left;Decreased stance time - left;Decreased step length - right Gait velocity: decreased   General Gait Details: cues for increased bilat step lengths and posture; decreased cadence; pt is able to improve stride length with mod cues; L LE instability noted at times during  session and pt reported increased heaviness compared to yesterday   Stairs             Wheelchair Mobility    Modified Rankin (Stroke Patients Only) Modified Rankin (Stroke Patients Only) Pre-Morbid Rankin Score: Moderate disability Modified Rankin: Moderately severe disability     Balance Overall balance assessment: Needs assistance Sitting-balance support: Feet supported Sitting balance-Leahy Scale: Fair     Standing balance support: During functional activity;Bilateral upper extremity supported Standing balance-Leahy Scale: Poor                              Cognition Arousal/Alertness: Awake/alert Behavior During Therapy: WFL for tasks assessed/performed Overall Cognitive Status: Within Functional Limits for tasks assessed                                        Exercises      General Comments        Pertinent Vitals/Pain Pain Assessment: Faces Faces Pain Scale: Hurts little more Pain Location: posterior L LE and L UE Pain Descriptors / Indicators: Sore;Aching Pain Intervention(s): Limited activity within patient's tolerance;Repositioned    Home Living                      Prior Function            PT Goals (current goals can now be found in the care plan section) Acute Rehab PT Goals Patient Stated Goal: to go back home PT Goal Formulation: With patient Time For Goal Achievement: 07/04/17 Potential to Achieve Goals: Good Progress  towards PT goals: Progressing toward goals    Frequency    Min 4X/week      PT Plan Current plan remains appropriate    Co-evaluation              AM-PAC PT "6 Clicks" Daily Activity  Outcome Measure  Difficulty turning over in bed (including adjusting bedclothes, sheets and blankets)?: A Little Difficulty moving from lying on back to sitting on the side of the bed? : A Little Difficulty sitting down on and standing up from a chair with arms (e.g., wheelchair,  bedside commode, etc,.)?: Unable Help needed moving to and from a bed to chair (including a wheelchair)?: A Little Help needed walking in hospital room?: A Little Help needed climbing 3-5 steps with a railing? : A Little 6 Click Score: 16    End of Session Equipment Utilized During Treatment: Gait belt Activity Tolerance: Patient tolerated treatment well Patient left: in bed;with call bell/phone within reach;with bed alarm set Nurse Communication: Mobility status PT Visit Diagnosis: Unsteadiness on feet (R26.81);Muscle weakness (generalized) (M62.81);Difficulty in walking, not elsewhere classified (R26.2)     Time: 7591-6384 PT Time Calculation (min) (ACUTE ONLY): 27 min  Charges:  $Gait Training: 23-37 mins                    G Codes:       Katrina Rodriguez, PTA Pager: [phone]     Katrina Rodriguez 06/22/2017, 9:41 AM

## 2017-06-22 NOTE — Progress Notes (Signed)
Patient is not a candidate for admission to inpt rehab per Dr. Letta Pate . RN CM aware. We will sign off at this time. [phone]

## 2017-06-22 NOTE — Progress Notes (Signed)
PROGRESS NOTE    Katrina Rodriguez  WPY:[id number] DOB: [birth year] DOA: 06/19/2017 PCP: Carol Ada, MD   Outpatient Specialists:  [name] (cards) [name] (neuro)   Brief Narrative:  84y/o female who lives alone she has a h/o CVA with left sided stable weakness. she states she has been feeling weaker the last week or so but today she had definite worsened LUE and LLE weakness that has persisted all day.   Assessment & Plan:   Active Problems:   PAROXYSMAL ATRIAL FIBRILLATION   Complete heart block (HCC)   Hypothyroidism   Left-sided weakness   Left sided weakness- from TIA -neuro consult appreciated: She presented with a TIA likely scarred embolic etiology from atrial fibrillation despite optimal anticoagulation. Patient states she has not tolerated Xarelto on eliquis in the past and would like to stay on warfarin Stop ASA per neuro note as CTA negative for blockages/CVA -warfarin continued -LDL > 70-- added statin (patient refusing as Dr. [name] says she does not have cholesterol issues) -echo/carotid- wnl  chest pain - 2D echo: - Left ventricle: The cavity size was normal. Wall thickness was   normal. Systolic function was normal. The estimated ejection   fraction was in the range of 60% to 65%. Wall motion was normal;   there were no regional wall motion abnormalities. There was no   evidence of elevated ventricular filling pressure by Doppler   parameters. -resolved -CE negative  Orthostatic hypotension -continue florinef   Complete heart block (HCC) -Permanent pacemaker in place >30yrs old  PAROXYSMAL ATRIAL FIBRILLATION -continue tikosyn and coumadin. Pharmacy consult for dosing     DVT prophylaxis:  Fully anticoagulated   Code Status: DNR   Family Communication: Patient refused  Disposition Plan:  Home in AM with "home first" and maxed out assistance   Consultants:   neuro   Subjective: Does not want to go to rehab since she can not go  to CIR-- did not want me to call daughter to discuss  Objective: Vitals:   06/22/17 0011 06/22/17 0403 06/22/17 0729 06/22/17 1151  BP: 133/81 122/67 137/62 (!) 151/69  Pulse: (!) 53 (!) 53 60 60  Resp: 18 18 14 15   Temp: 98.4 F (36.9 C) 98.1 F (36.7 C) (!) 97.5 F (36.4 C) (!) 97.5 F (36.4 C)  TempSrc: Oral Oral Oral Oral  SpO2: 95% 99% 97% 97%  Weight:      Height:        Intake/Output Summary (Last 24 hours) at 06/22/2017 1507 Last data filed at 06/21/2017 1853 Gross per 24 hour  Intake 120 ml  Output -  Net 120 ml   Filed Weights   06/19/17 1424  Weight: 49.9 kg (110 lb)    Examination:  General exam: in bed-- NAD Respiratory system: no increased work of breathing Cardiovascular system: irr     Data Reviewed: I have personally reviewed following labs and imaging studies  CBC: Recent Labs  Lab 06/19/17 1446 06/20/17 0800  WBC 4.8 4.9  HGB 13.3 13.5  HCT 40.2 41.5  MCV 95.9 97.2  PLT 120* 053*   Basic Metabolic Panel: Recent Labs  Lab 06/19/17 1446 06/20/17 0800  NA 141 140  K 4.0 4.2  CL 108 110  CO2 24 23  GLUCOSE 100* 90  BUN 24* 18  CREATININE 1.09* 0.98  CALCIUM 8.4* 8.4*   GFR: Estimated Creatinine Clearance: 31.9 mL/min (by C-G formula based on SCr of 0.98 mg/dL). Liver Function Tests: No results for input(s):  AST, ALT, ALKPHOS, BILITOT, PROT, ALBUMIN in the last 168 hours. No results for input(s): LIPASE, AMYLASE in the last 168 hours. No results for input(s): AMMONIA in the last 168 hours. Coagulation Profile: Recent Labs  Lab 06/19/17 1446 06/20/17 0800 06/21/17 0438 06/22/17 0655  INR 2.26 2.13 2.09 2.28   Cardiac Enzymes: Recent Labs  Lab 06/20/17 0003 06/20/17 0800 06/20/17 1232  TROPONINI <0.03 <0.03 <0.03   BNP (last 3 results) No results for input(s): PROBNP in the last 8760 hours. HbA1C: No results for input(s): HGBA1C in the last 72 hours. CBG: No results for input(s): GLUCAP in the last 168  hours. Lipid Profile: Recent Labs    06/20/17 0800  CHOL 206*  HDL 54  LDLCALC 134*  TRIG 90  CHOLHDL 3.8   Thyroid Function Tests: No results for input(s): TSH, T4TOTAL, FREET4, T3FREE, THYROIDAB in the last 72 hours. Anemia Panel: No results for input(s): VITAMINB12, FOLATE, FERRITIN, TIBC, IRON, RETICCTPCT in the last 72 hours. Urine analysis:    Component Value Date/Time   COLORURINE YELLOW 06/19/2017 1528   APPEARANCEUR CLEAR 06/19/2017 1528   LABSPEC 1.011 06/19/2017 1528   PHURINE 5.0 06/19/2017 1528   GLUCOSEU NEGATIVE 06/19/2017 1528   HGBUR MODERATE (A) 06/19/2017 1528   BILIRUBINUR NEGATIVE 06/19/2017 [place] 06/19/2017 1528   PROTEINUR NEGATIVE 06/19/2017 1528   NITRITE NEGATIVE 06/19/2017 1528   LEUKOCYTESUR NEGATIVE 06/19/2017 1528     )No results found for this or any previous visit (from the past 240 hour(s)).    Anti-infectives (From admission, onward)   None       Radiology Studies: Ct Angio Head W Or Wo Contrast  Result Date: 06/21/2017 CLINICAL DATA:  Follow-up CVA. EXAM: CT ANGIOGRAPHY HEAD TECHNIQUE: Multidetector CT imaging of the head was performed using the standard protocol during bolus administration of intravenous contrast. Multiplanar CT image reconstructions and MIPs were obtained to evaluate the vascular anatomy. CONTRAST:  71mL ISOVUE-370 IOPAMIDOL (ISOVUE-370) INJECTION 76% COMPARISON:  CT head 06/19/2017. FINDINGS: CT HEAD Brain: No evidence of acute infarction, hemorrhage, hydrocephalus, extra-axial collection or mass lesion/mass effect. Generalized atrophy. Vascular: Reported separately Skull: Normal. Negative for fracture or focal lesion. Sinuses: Imaged portions are clear. Orbits: No acute finding.  Chronic hemorrhage LEFT globe. CTA HEAD Anterior circulation: No significant stenosis, proximal occlusion, aneurysm, or vascular malformation. BILATERAL cavernous atheromatous change. Posterior circulation: No significant  stenosis, proximal occlusion, aneurysm, or vascular malformation. LEFT vertebral dominant. Venous sinuses: As permitted by contrast timing, patent. Anatomic variants: None. Delayed phase: No abnormal enhancement. IMPRESSION: No proximal stenosis or large vessel occlusion. No visible developing infarction in the brain. No abnormal postcontrast enhancement. Electronically Signed   By: Staci Righter M.D.   On: 06/21/2017 17:24        Scheduled Meds: . aspirin EC  81 mg Oral Daily  . atorvastatin  40 mg Oral q1800  . dofetilide  250 mcg Oral BID  . fludrocortisone  0.1 mg Oral BID  . potassium chloride SA  40 mEq Oral Daily  . prednisoLONE acetate  1 drop Both Eyes QID  . warfarin  4 mg Oral ONCE-1800  . Warfarin - Pharmacist Dosing Inpatient   Does not apply q1800   Continuous Infusions:   LOS: 2 days    Time spent: 15 min    Geradine Girt, DO [facility] Pager [phone]  If 7PM-7AM, please contact night-coverage [email or web address] Password [email or web address] 06/22/2017, 3:07 PM

## 2017-06-22 NOTE — Progress Notes (Signed)
Occupational Therapy Treatment Patient Details Name: Katrina Rodriguez MRN: [id number] DOB: December 03, 1928 Today's Date: 06/22/2017    History of present illness 84 y.o. female with history of atrial fibrillation, status post ablation and on Tikosyn, history of CVA, chronic left-sided weakness, history of syncopal episodes, has a pacemaker, anticoagulated, on Coumadin, chronic anemia, presents to emergency department with complaint of left-sided weakness   OT comments  PATIENT REQUIRES MAX CUES SECONDARY TO BLINDNESS. PATIENT WAS S WITH SUPINE TO SIT AND SIT TO SUPINE. PATIENT WAS MIN GUARD ASSIST WITH SIT TO STAND, STAND TO SIT AND AMB. PATIENT REQUIRES CUES FOR PROPER HAND PLACEMENT. PATIENT REQUIRED MIN A TO STEER WALKER. PATIENT PATIENT WAS S TO STAND AT [facility] AND PERFORM GROOMING TASKS. PATIENT WAS MIN GUARD ASSIST WITH AMB AND TRANSFER TO COMMODE AND WAS S WITH HYGIENE AND CLOTHING MANGEMENT. PATIENT WAS S WITH DONNING AND DOFFING SOCKS. PATIENT REQUIRED CUES FOR SELF FEEDING FOR TRAY SETUP.  Follow Up Recommendations       Equipment Recommendations       Recommendations for Other Services      Precautions / Restrictions Precautions Precautions: Fall Restrictions Weight Bearing Restrictions: No       Mobility Bed Mobility Overal bed mobility: Modified Independent Bed Mobility: Supine to Sit;Sit to Supine     Supine to sit: Supervision Sit to supine: Supervision   General bed mobility comments: increased time and effort  Transfers Overall transfer level: Needs assistance Equipment used: Rolling walker (2 wheeled);1 person hand held assist Transfers: Sit to/from Stand Sit to Stand: Min guard         General transfer comment: CUES FOR PROPER  HAND PLACEMENT    Balance Overall balance assessment: Needs assistance Sitting-balance support: Feet supported Sitting balance-Leahy Scale: Fair     Standing balance support: During functional activity;Bilateral upper extremity  supported Standing balance-Leahy Scale: Poor                             ADL either performed or assessed with clinical judgement   ADL                       Lower Body Dressing: Min guard   Toilet Transfer: Min guard           Functional mobility during ADLs: Min guard;Minimal assistance General ADL Comments: And     Vision       Perception     Praxis      Cognition Arousal/Alertness: Awake/alert Behavior During Therapy: WFL for tasks assessed/performed Overall Cognitive Status: Within Functional Limits for tasks assessed                                          Exercises     Shoulder Instructions       General Comments      Pertinent Vitals/ Pain       Pain Assessment: 0-10 Pain Score: 0-No pain Faces Pain Scale: Hurts little more Pain Location: posterior L LE and L UE Pain Descriptors / Indicators: Sore;Aching Pain Intervention(s): Limited activity within patient's tolerance;Repositioned  Home Living  Prior Functioning/Environment              Frequency           Progress Toward Goals  OT Goals(current goals can now be found in the care plan section)  Progress towards OT goals: Progressing toward goals  Acute Rehab OT Goals Patient Stated Goal: PATIENT WANTS TO GET STRONGER ADL Goals Pt Will Perform Grooming: with supervision;standing Pt Will Perform Lower Body Dressing: with supervision;with min guard assist;sit to/from stand Pt Will Transfer to Toilet: with min guard assist;ambulating;grab bars Pt Will Perform Toileting - Clothing Manipulation and hygiene: with supervision  Plan      Co-evaluation                 AM-PAC PT "6 Clicks" Daily Activity     Outcome Measure   Help from another person eating meals?: A Little Help from another person taking care of personal grooming?: A Little Help from another person toileting,  which includes using toliet, bedpan, or urinal?: A Little Help from another person bathing (including washing, rinsing, drying)?: A Little Help from another person to put on and taking off regular upper body clothing?: A Little Help from another person to put on and taking off regular lower body clothing?: A Little 6 Click Score: 18    End of Session Equipment Utilized During Treatment: Gait belt;Rolling walker      Activity Tolerance Patient tolerated treatment well   Patient Left in bed;with call bell/phone within reach;with bed alarm set   Nurse Communication          Time: 8756-4332 OT Time Calculation (min): 38 min  Charges: OT General Charges $OT Visit: 1 Visit OT Treatments $Self Care/Home Management : 95-18 mins  6 CLICKS   Katrina Rodriguez 06/22/2017, 12:06 PM

## 2017-06-22 NOTE — Consult Note (Signed)
            [facility] CM Primary Care Navigator  06/22/2017  Katrina Rodriguez 1928-08-28 [id number]   Went to see patient at the bedside to identify possible discharge needs. Patient reports "feeling weaker and falling" that had led to this admission.    Patient's chart indicated that discharge disposition is [facility] Inpatient Rehab) per therapy recommendationbut she is not a candidate for admission per rehab coordinator.  Per Inpatient CM, discussed with patient of possible Home First program [facility]) referral as alternative option, given her weakness and living alone. Patient was agreeable to referral. Referral will be made to [facility] with [place] for appropriateness with the program.  [place] [facility] [place] [facility] care provider.   [facility] [address] [facility] withoutdifficulty so far.  According topatient, she hasbeenmanaging hermedications at home using "pill box" system filled weekly.  Patient verbalized that a friend [name]) has been providing transportation to her doctors' appointments.  Patientlives alone (with loss of vision) but her friend has been assisting with her needs at home. She states that daughter (working long hours) also assists her when able.  Patientvoiced understanding to callprimary care provider'soffice onceshe goesbackhome,to schedulea post discharge follow-up visitwithin1- 2 weeksor sooner if needed. Patient letter (with PCP's contact number) was provided as a reminder.    Explained to patientabout [facility] CM services available for health managementand resourcesat home but denies any pressing needs or concerns at this point. Patientexpressed understandingof needto seekreferral to [facility] care managementfrom primary care provider if deemed necessaryand appropriateforservicesin the future.  [place] care  management information provided for future needs thatmay arise.  Primary care provider's office is listed as providing transition of care (TOC) follow-up.   For additional questions please contact:  Edwena Felty A. Tinnie Kunin, BSN, RN-BC [facility] PRIMARY CARE Navigator Cell: [phone]

## 2017-06-22 NOTE — Progress Notes (Signed)
ANTICOAGULATION CONSULT NOTE - Follow Up Consult  Pharmacy Consult for Coumadin Indication: atrial fibrillation and prior stroke  Allergies  Allergen Reactions  . Amiodarone Other (See Comments)    Caused thyroid problems  . Amoxicillin Hives  . Aspirin Other (See Comments)    On tikosyn and warfarin  . Codeine Other (See Comments)    Causes BP to drop  . Darvocet [Propoxyphene N-Acetaminophen] Hives  . Demerol Itching  . Eliquis [Apixaban] Nausea Only  . Penicillins Hives  . Xarelto [Rivaroxaban] Rash    Patient Measurements: Height: 5\' 4"  (162.6 cm) Weight: 110 lb (49.9 kg) IBW/kg (Calculated) : 54.7  Vital Signs: Temp: 97.5 F (36.4 C) (04/19 1151) Temp Source: Oral (04/19 1151) BP: 151/69 (04/19 1151) Pulse Rate: 60 (04/19 1151)  Labs: Recent Labs    06/19/17 1446 06/20/17 0003 06/20/17 0800 06/20/17 1232 06/21/17 0438 06/22/17 0655  HGB 13.3  --  13.5  --   --   --   HCT 40.2  --  41.5  --   --   --   PLT 120*  --  114*  --   --   --   LABPROT 24.7*  --  23.6*  --  23.3* 24.9*  INR 2.26  --  2.13  --  2.09 2.28  CREATININE 1.09*  --  0.98  --   --   --   TROPONINI  --  <0.03 <0.03 <0.03  --   --     Estimated Creatinine Clearance: 31.9 mL/min (by C-G formula based on SCr of 0.98 mg/dL).  Assessment:  84 yr old female continues on Coumadin for atrial fibrillation and prior stroke.  INR 2.28, therapeutic. No further trend down after increased dose to 4 mg x 1 yesterday. ASA 81 mg daily added this admit.    Noted plan to continue Coumadin due to hx allergy to both Xarelto and Eliquis. Neither was listed as allergy; patient reports rash with Xarelto, nausea with Eliquis.   Home Coumadin regimen: 2 mg daily except 4 mg on Mondays and Fridays.  Goal of Therapy:  INR 2-3 Monitor platelets by anticoagulation protocol: Yes   Plan:   Coumadin 4 mg again today, usual Friday dose.  Expect to continue home regimen: 2 mg daily except 4 mg on Mondays and  Fridays.  Discussed with patient.  Daily PT/INR for now.  Arty Baumgartner, [place] Pager: [phone] 06/22/2017,2:43 PM

## 2017-06-22 NOTE — Discharge Instructions (Signed)

## 2017-06-22 NOTE — Progress Notes (Addendum)
[facility] not able to accept pt for rehab. CM met with her and she doesn't want to go to any other form of rehab. She is requesting to d/c home. CM inquired about the Home First program through [place]. She was interested. CM notified Tommi Rumps with [facility] and he will meet with the patient and let CM if she is accepted. Pt states she is going to stay with a family from church Saturday night until Monday am. Tommi Rumps with [facility] made aware.  CM following.  Addendum: pt qualifies for [facility]. Pt states she will have a friend stay the night Sat and another friend stay the night Sunday night. [facility] will supplement with aides in between. MD updated.

## 2017-06-23 LAB — PROTIME-INR
INR: 2.53
PROTHROMBIN TIME: 27 s — AB (ref 11.4–15.2)

## 2017-06-23 MED ORDER — ATORVASTATIN CALCIUM 40 MG PO TABS: 40.0000 mg | ORAL_TABLET | Freq: Every day | ORAL | 0 refills | Status: DC

## 2017-06-23 MED ORDER — WARFARIN SODIUM 2 MG PO TABS
2.0000 mg | ORAL_TABLET | Freq: Once | ORAL | Status: DC
  Filled 2017-06-23: qty 1

## 2017-06-23 MED ORDER — KETOROLAC TROMETHAMINE 30 MG/ML IJ SOLN
15.0000 mg | Freq: Once | INTRAMUSCULAR | Status: AC
  Administered 2017-06-23: 15 mg via INTRAVENOUS
  Filled 2017-06-23: qty 1

## 2017-06-23 NOTE — Discharge Summary (Signed)
Physician Discharge Summary  Jacinda Kanady Boyan OZH:[id number] DOB: 1929/03/03 DOA: 06/19/2017  PCP: Carol Ada, MD  Admit date: 06/19/2017 Discharge date: 06/23/2017  Time spent: 45 minutes  Recommendations for Outpatient Follow-up:  Patient will be discharged to home with home first program.  Patient will need to follow up with primary care provider within one week of discharge.  Follow up with neurology in 6 weeks. Patient should continue medications as prescribed.  Patient should follow a regular  diet.   Discharge Diagnoses:  Left sided weakness, TIA Chest pain Orthostatic hypotension Complete heart block Paroxysmal atrial fibrillation  Discharge Condition: Stable  Diet recommendation: Regular  Filed Weights   06/19/17 1424  Weight: 49.9 kg (110 lb)    History of present illness:  On 06/19/2017 by Dr. Knox Saliva  this is a 84y/o female who lives alone she has a h/o CVA with left sided stable weakness. she states she has been feeling weaker the last week or so but today she had definite worsened LUE and LLE weakness that has persisted all day. She denies HA and her blood pressure is normal. She reported some nausea but no vomiting.   She reports chest pressure but no palpitations. She had associated SOB. This has since resolved. It was centrally located witrh no radiation. She has a h/o afib and SSS. She has a PPM in place.her pressure at its worse was 8/10, currently 0/10. Patient was at rest.  She also reports orthostatic hypotension on florinef. She states she passed out frequently as a result. She passed out in church 2 sundays ago. Her daughter is actively searching for someone to stay at home with her as she lives alone.  Hospital Course:  Left sided weakness, TIA -CT head showed normal aging brain -CTA head showed no proximal stenosis or large vessel occlusion. -MRI could not be obtained as patient has pacemaker -Echocardiogram showed an EF of 60-65%, no cardiac  source of emboli was identified.  Wall motion was normal, there were no regional wall motion abnormalities -Carotid ultrasound: 1-39% ICA stenosis bilaterally -LDL 134, hemoglobin A1c was not done -Neurology consulted and appreciated, likely presented with TIA with embolic etiology from atrial fibrillation despite optimal anticoagulation.  She has been on Eliquis and Xarelto in the past and was not tolerated, would like to remain on Coumadin -Continue statin -PT recommended CIR however patient was not found to be a candidate for inpatient rehab -Patient does not wish to go to SNF for rehab, would like to go home.  States she has members of her church that come to look after her. -Case management arranged home health, home first program  Chest pain -Longer having complaints of chest pain -Echocardiogram as above -Troponin cycled and unremarkable  Orthostatic hypotension -Continue Florinef  Complete heart block -Permanent pacemaker in place, greater than 20 years old  Paroxysmal atrial fibrillation -Continue Tikosyn and Coumadin  Procedures: Echocardiogram  Consultations: Neurology  CODE STATUS: DNR  Discharge Exam: Vitals:   06/23/17 0330 06/23/17 0826  BP: (!) 144/69 (!) 110/58  Pulse: (!) 50 60  Resp: 16 17  Temp: 98.3 F (36.8 C) (!) 97.5 F (36.4 C)  SpO2: 94% 97%     General: Well developed, thin, elderly, no apparent distress  HEENT: NCAT, mucous membranes moist.  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  4/5 sided strength  Psych: Normal affect and demeanor with intact judgement and insight  Discharge Instructions Discharge Instructions    Ambulatory referral to Neurology    Complete by:  As directed    Follow up with stroke clinic NP (Jessica Vanschaick or Cecille Rubin, if both not available, consider Dr. Antony Contras, Dr. Bess Harvest, or Dr. Sarina Ill) at [facility] in about 4 weeks.  Pt of Dr. [name]   Discharge instructions   Complete by:  As directed    Patient will be discharged to home with home first program.  Patient will need to follow up with primary care provider within one week of discharge.  Follow up with neurology in 6 weeks. Patient should continue medications as prescribed.  Patient should follow a regular  diet.     Allergies as of 06/23/2017      Reactions   Amiodarone Other (See Comments)   Caused thyroid problems   Amoxicillin Hives   Aspirin Other (See Comments)   On tikosyn and warfarin   Codeine Other (See Comments)   Causes BP to drop   Darvocet [propoxyphene N-acetaminophen] Hives   Demerol Itching   Eliquis [apixaban] Nausea Only   Penicillins Hives   Xarelto [rivaroxaban] Rash      Medication List    TAKE these medications   atorvastatin 40 MG tablet Commonly known as:  LIPITOR Take 1 tablet (40 mg total) by mouth daily at 6 PM.   dofetilide 250 MCG capsule Commonly known as:  TIKOSYN Take 1 capsule (250 mcg total) by mouth 2 (two) times daily.   fludrocortisone 0.1 MG tablet Commonly known as:  FLORINEF Take 1 tablet (0.1 mg total) by mouth 2 (two) times daily.   potassium chloride SA 20 MEQ tablet Commonly known as:  K-DUR,KLOR-CON Take 2 tablets (40 mEq total) by mouth daily.   prednisoLONE acetate 1 % ophthalmic suspension Commonly known as:  PRED FORTE PLACE 1 DROP IN BOTH EYES 4 TIMES DAILY   warfarin 4 MG tablet Commonly known as:  COUMADIN Take as directed. If you are unsure how to take this medication, talk to your nurse or doctor. Original instructions:  Take 1/2 to 1 tablet by mouth  daily or as directed by  coumadin clinic What changed:    how much to take  how  to take this  when to take this  additional instructions      Allergies  Allergen Reactions  . Amiodarone Other (See Comments)    Caused thyroid problems  . Amoxicillin Hives  . Aspirin Other (See Comments)    On tikosyn and warfarin  . Codeine Other (See Comments)    Causes BP to drop  . Darvocet [Propoxyphene N-Acetaminophen] Hives  . Demerol Itching  . Eliquis [Apixaban] Nausea Only  . Penicillins Hives  . Xarelto [Rivaroxaban] Rash   Follow-up Information    Kathrynn Ducking, MD Follow up in 4 week(s).   Specialty:  Neurology Why:  or NP with stroke clinic at Dr. [name]' office. office will call wtih appt date and time Contact information: [place] [address] [place], [facility] Follow up.   Specialty:  Home Health Services Why:  They will call you  Contact information: [place] [address] [phone]  Carol Ada, MD. Schedule an appointment as soon as possible for a visit in 1 week(s).   Specialty:  Family Medicine Why:  Hospital follow up Contact information: [place], [place] [address] [phone]        [name], Peter M, MD .   Specialty:  Cardiology Contact information: [address] [place] [address] [phone]            The results of significant diagnostics from this hospitalization (including imaging, microbiology, ancillary and laboratory) are listed below for reference.    Significant Diagnostic Studies: Ct Angio Head W Or Wo Contrast  Result Date: 06/21/2017 CLINICAL DATA:  Follow-up CVA. EXAM: CT ANGIOGRAPHY HEAD TECHNIQUE: Multidetector CT imaging of the head was performed using the standard protocol during bolus administration of intravenous contrast. Multiplanar CT image reconstructions and MIPs were obtained to evaluate the vascular anatomy. CONTRAST:  32mL ISOVUE-370 IOPAMIDOL  (ISOVUE-370) INJECTION 76% COMPARISON:  CT head 06/19/2017. FINDINGS: CT HEAD Brain: No evidence of acute infarction, hemorrhage, hydrocephalus, extra-axial collection or mass lesion/mass effect. Generalized atrophy. Vascular: Reported separately Skull: Normal. Negative for fracture or focal lesion. Sinuses: Imaged portions are clear. Orbits: No acute finding.  Chronic hemorrhage LEFT globe. CTA HEAD Anterior circulation: No significant stenosis, proximal occlusion, aneurysm, or vascular malformation. BILATERAL cavernous atheromatous change. Posterior circulation: No significant stenosis, proximal occlusion, aneurysm, or vascular malformation. LEFT vertebral dominant. Venous sinuses: As permitted by contrast timing, patent. Anatomic variants: None. Delayed phase: No abnormal enhancement. IMPRESSION: No proximal stenosis or large vessel occlusion. No visible developing infarction in the brain. No abnormal postcontrast enhancement. Electronically Signed   By: Staci Righter M.D.   On: 06/21/2017 17:24   Dg Chest 2 View  Result Date: 06/19/2017 CLINICAL DATA:  84 y/o  F; left-sided weakness for 1 month. EXAM: CHEST - 2 VIEW COMPARISON:  12/12/2015 chest radiograph FINDINGS: Normal cardiac silhouette. Aortic atherosclerosis with calcification. Two lead pacemaker. No focal consolidation. No pleural effusion or pneumothorax. No acute osseous abnormality is evident. IMPRESSION: No acute pulmonary process identified.  Aortic atherosclerosis. Electronically Signed   By: Kristine Garbe M.D.   On: 06/19/2017 17:48   Ct Head Wo Contrast  Result Date: 06/19/2017 CLINICAL DATA:  Intermittent left-sided weakness for 1 month EXAM: CT HEAD WITHOUT CONTRAST TECHNIQUE: Contiguous axial images were obtained from the base of the skull through the vertex without intravenous contrast. COMPARISON:  Head CT 12/25/2016 FINDINGS: Brain: No mass lesion, intraparenchymal hemorrhage or extra-axial collection. No evidence of  acute cortical infarct. Normal appearance of the brain parenchyma and extra axial spaces for age. Vascular: No hyperdense vessel or unexpected vascular calcification. Skull: Normal visualized skull base, calvarium and extracranial soft tissues. Sinuses/Orbits: No sinus fluid levels or advanced mucosal thickening. No mastoid effusion. Normal orbits. IMPRESSION: Normal aging brain. Electronically Signed   By: Ulyses Jarred M.D.   On: 06/19/2017 17:34    Microbiology: No results found for this or any previous visit (from the past 240 hour(s)).   Labs: Basic Metabolic Panel: Recent Labs  Lab 06/19/17 1446 06/20/17 0800  NA 141 140  K 4.0 4.2  CL 108 110  CO2 24 23  GLUCOSE 100* 90  BUN 24* 18  CREATININE 1.09* 0.98  CALCIUM 8.4* 8.4*   Liver Function Tests: No results for input(s): AST, ALT, ALKPHOS, BILITOT, PROT, ALBUMIN in the last 168 hours. No results for input(s): LIPASE, AMYLASE in the last 168 hours. No results for input(s): AMMONIA in  the last 168 hours. CBC: Recent Labs  Lab 06/19/17 1446 06/20/17 0800  WBC 4.8 4.9  HGB 13.3 13.5  HCT 40.2 41.5  MCV 95.9 97.2  PLT 120* 114*   Cardiac Enzymes: Recent Labs  Lab 06/20/17 0003 06/20/17 0800 06/20/17 1232  TROPONINI <0.03 <0.03 <0.03   BNP: BNP (last 3 results) No results for input(s): BNP in the last 8760 hours.  ProBNP (last 3 results) No results for input(s): PROBNP in the last 8760 hours.  CBG: No results for input(s): GLUCAP in the last 168 hours.     Signed:  Cristal Ford  [facility] 06/23/2017, 2:00 PM

## 2017-06-23 NOTE — Progress Notes (Signed)
Pt d/c home, pt is stable with no new concerns. D/C instructions done with teach back. Pt's daughter Jama Flavors notified of patients d/c. Daughter said patients friend  Will be home waiting for patients arrival.. Pt will be transported out of facility by [facility]

## 2017-06-23 NOTE — Progress Notes (Signed)
ANTICOAGULATION CONSULT NOTE - Follow Up Consult  Pharmacy Consult for Coumadin Indication: atrial fibrillation and prior stroke  Allergies  Allergen Reactions  . Amiodarone Other (See Comments)    Caused thyroid problems  . Amoxicillin Hives  . Aspirin Other (See Comments)    On tikosyn and warfarin  . Codeine Other (See Comments)    Causes BP to drop  . Darvocet [Propoxyphene N-Acetaminophen] Hives  . Demerol Itching  . Eliquis [Apixaban] Nausea Only  . Penicillins Hives  . Xarelto [Rivaroxaban] Rash    Patient Measurements: Height: 5\' 4"  (162.6 cm) Weight: 110 lb (49.9 kg) IBW/kg (Calculated) : 54.7  Vital Signs: Temp: 97.5 F (36.4 C) (04/20 0826) Temp Source: Oral (04/20 0826) BP: 110/58 (04/20 0826) Pulse Rate: 60 (04/20 0826)  Labs: Recent Labs    06/20/17 1232 06/21/17 0438 06/22/17 0655 06/23/17 0523  LABPROT  --  23.3* 24.9* 27.0*  INR  --  2.09 2.28 2.53  TROPONINI <0.03  --   --   --     Estimated Creatinine Clearance: 31.9 mL/min (by C-G formula based on SCr of 0.98 mg/dL).  Assessment:  84 yr old female continues on Coumadin for atrial fibrillation and prior stroke.  INR 2.28, therapeutic. No further trend down after increased dose to 4 mg x 1 yesterday. ASA 81 mg daily added this admit.    Noted plan to continue Coumadin due to hx allergy to both Xarelto and Eliquis. Neither was listed as allergy; patient reports rash with Xarelto, nausea with Eliquis.   Home Coumadin regimen: 2 mg daily except 4 mg on Mondays and Fridays.  Goal of Therapy:  INR 2-3 Monitor platelets by anticoagulation protocol: Yes   Plan:   Coumadin 2 mg today, per home dose.  Expect to continue home regimen: 2 mg daily except 4 mg on Mondays and Fridays.  Discussed with patient.  Daily PT/INR for now.  Bryttany Tortorelli A. Levada Dy, PharmD, [place] Pager: [phone]  06/23/2017,11:03 AM

## 2017-06-24 DIAGNOSIS — I951 Orthostatic hypotension: Secondary | ICD-10-CM | POA: Diagnosis not present

## 2017-06-24 DIAGNOSIS — I69354 Hemiplegia and hemiparesis following cerebral infarction affecting left non-dominant side: Secondary | ICD-10-CM | POA: Diagnosis not present

## 2017-06-25 ENCOUNTER — Telehealth: Payer: Self-pay | Admitting: Cardiology

## 2017-06-25 ENCOUNTER — Other Ambulatory Visit: Payer: Self-pay | Admitting: *Deleted

## 2017-06-25 DIAGNOSIS — I69354 Hemiplegia and hemiparesis following cerebral infarction affecting left non-dominant side: Secondary | ICD-10-CM | POA: Diagnosis not present

## 2017-06-25 DIAGNOSIS — I951 Orthostatic hypotension: Secondary | ICD-10-CM | POA: Diagnosis not present

## 2017-06-25 NOTE — Telephone Encounter (Signed)
Returned call to patient's daughter [name].She was calling to verify mother's medication from recent hospitalization.Medications reviewed.

## 2017-06-25 NOTE — Consult Note (Signed)
Made aware by Tommi Rumps with [facility] program that Katrina Rodriguez enrolled in the [place] program.  Telephone call to Katrina Rodriguez to make aware that [place] Management could assist with transportation to MD appointments or pharmacy concerns if these needs arise while on the [place] program.  Katrina Rodriguez appreciative of call.   Of note, Katrina Rodriguez was previously visited by [facility] Primary Care RN Navigator while hospitalized.  Notification of [place] enrollment sent to [facility] office.   Marthenia Rolling, MSN-Ed, RN,BSN [facility] Liaison [phone]

## 2017-06-25 NOTE — Telephone Encounter (Signed)
New Message:    Please call,she wants to discuss pt's condition.

## 2017-06-27 ENCOUNTER — Ambulatory Visit (INDEPENDENT_AMBULATORY_CARE_PROVIDER_SITE_OTHER): Payer: Medicare Other | Admitting: Pharmacist

## 2017-06-27 DIAGNOSIS — Z5181 Encounter for therapeutic drug level monitoring: Secondary | ICD-10-CM | POA: Diagnosis not present

## 2017-06-27 DIAGNOSIS — I480 Paroxysmal atrial fibrillation: Secondary | ICD-10-CM | POA: Diagnosis not present

## 2017-06-27 DIAGNOSIS — I69354 Hemiplegia and hemiparesis following cerebral infarction affecting left non-dominant side: Secondary | ICD-10-CM | POA: Diagnosis not present

## 2017-06-27 DIAGNOSIS — I951 Orthostatic hypotension: Secondary | ICD-10-CM | POA: Diagnosis not present

## 2017-06-27 LAB — POCT INR: INR: 2.3

## 2017-06-28 DIAGNOSIS — I951 Orthostatic hypotension: Secondary | ICD-10-CM | POA: Diagnosis not present

## 2017-06-28 DIAGNOSIS — I69354 Hemiplegia and hemiparesis following cerebral infarction affecting left non-dominant side: Secondary | ICD-10-CM | POA: Diagnosis not present

## 2017-07-02 DIAGNOSIS — I69354 Hemiplegia and hemiparesis following cerebral infarction affecting left non-dominant side: Secondary | ICD-10-CM | POA: Diagnosis not present

## 2017-07-02 DIAGNOSIS — I951 Orthostatic hypotension: Secondary | ICD-10-CM | POA: Diagnosis not present

## 2017-07-04 DIAGNOSIS — I69354 Hemiplegia and hemiparesis following cerebral infarction affecting left non-dominant side: Secondary | ICD-10-CM | POA: Diagnosis not present

## 2017-07-04 DIAGNOSIS — I951 Orthostatic hypotension: Secondary | ICD-10-CM | POA: Diagnosis not present

## 2017-07-05 DIAGNOSIS — I951 Orthostatic hypotension: Secondary | ICD-10-CM | POA: Diagnosis not present

## 2017-07-05 DIAGNOSIS — I69354 Hemiplegia and hemiparesis following cerebral infarction affecting left non-dominant side: Secondary | ICD-10-CM | POA: Diagnosis not present

## 2017-07-09 DIAGNOSIS — I495 Sick sinus syndrome: Secondary | ICD-10-CM | POA: Diagnosis not present

## 2017-07-09 DIAGNOSIS — I69354 Hemiplegia and hemiparesis following cerebral infarction affecting left non-dominant side: Secondary | ICD-10-CM | POA: Diagnosis not present

## 2017-07-09 DIAGNOSIS — E538 Deficiency of other specified B group vitamins: Secondary | ICD-10-CM | POA: Diagnosis not present

## 2017-07-09 DIAGNOSIS — I6930 Unspecified sequelae of cerebral infarction: Secondary | ICD-10-CM | POA: Diagnosis not present

## 2017-07-09 DIAGNOSIS — Z950 Presence of cardiac pacemaker: Secondary | ICD-10-CM | POA: Diagnosis not present

## 2017-07-09 DIAGNOSIS — H547 Unspecified visual loss: Secondary | ICD-10-CM | POA: Diagnosis not present

## 2017-07-09 DIAGNOSIS — I951 Orthostatic hypotension: Secondary | ICD-10-CM | POA: Diagnosis not present

## 2017-07-09 DIAGNOSIS — Z8673 Personal history of transient ischemic attack (TIA), and cerebral infarction without residual deficits: Secondary | ICD-10-CM | POA: Diagnosis not present

## 2017-07-09 DIAGNOSIS — D649 Anemia, unspecified: Secondary | ICD-10-CM | POA: Diagnosis not present

## 2017-07-09 DIAGNOSIS — Z8679 Personal history of other diseases of the circulatory system: Secondary | ICD-10-CM | POA: Diagnosis not present

## 2017-07-09 DIAGNOSIS — R531 Weakness: Secondary | ICD-10-CM | POA: Diagnosis not present

## 2017-07-10 DIAGNOSIS — I69354 Hemiplegia and hemiparesis following cerebral infarction affecting left non-dominant side: Secondary | ICD-10-CM | POA: Diagnosis not present

## 2017-07-10 DIAGNOSIS — I951 Orthostatic hypotension: Secondary | ICD-10-CM | POA: Diagnosis not present

## 2017-07-11 DIAGNOSIS — I951 Orthostatic hypotension: Secondary | ICD-10-CM | POA: Diagnosis not present

## 2017-07-11 DIAGNOSIS — I69354 Hemiplegia and hemiparesis following cerebral infarction affecting left non-dominant side: Secondary | ICD-10-CM | POA: Diagnosis not present

## 2017-07-12 ENCOUNTER — Ambulatory Visit (INDEPENDENT_AMBULATORY_CARE_PROVIDER_SITE_OTHER): Payer: Medicare Other | Admitting: Pharmacist Clinician (PhC)/ Clinical Pharmacy Specialist

## 2017-07-12 DIAGNOSIS — I69354 Hemiplegia and hemiparesis following cerebral infarction affecting left non-dominant side: Secondary | ICD-10-CM | POA: Diagnosis not present

## 2017-07-12 DIAGNOSIS — I480 Paroxysmal atrial fibrillation: Secondary | ICD-10-CM

## 2017-07-12 DIAGNOSIS — Z5181 Encounter for therapeutic drug level monitoring: Secondary | ICD-10-CM

## 2017-07-12 DIAGNOSIS — I4891 Unspecified atrial fibrillation: Secondary | ICD-10-CM

## 2017-07-12 DIAGNOSIS — I951 Orthostatic hypotension: Secondary | ICD-10-CM | POA: Diagnosis not present

## 2017-07-12 LAB — POCT INR: INR: 1.7

## 2017-07-12 NOTE — Patient Instructions (Signed)
Description   Take extra 1/2 tablet today Thursday May 9, then continue with 1 tablet each Monday and Friday, 1/2 tablet all other days.  Repeat INR ih 1 week with [place] - please draw PT/INR any day week of May 13 and call the results to [facility] Coumadin clinic at [place]

## 2017-07-16 DIAGNOSIS — I69354 Hemiplegia and hemiparesis following cerebral infarction affecting left non-dominant side: Secondary | ICD-10-CM | POA: Diagnosis not present

## 2017-07-16 DIAGNOSIS — I951 Orthostatic hypotension: Secondary | ICD-10-CM | POA: Diagnosis not present

## 2017-07-18 ENCOUNTER — Ambulatory Visit (INDEPENDENT_AMBULATORY_CARE_PROVIDER_SITE_OTHER): Payer: Medicare Other | Admitting: Pharmacist Clinician (PhC)/ Clinical Pharmacy Specialist

## 2017-07-18 DIAGNOSIS — I480 Paroxysmal atrial fibrillation: Secondary | ICD-10-CM

## 2017-07-18 DIAGNOSIS — I69354 Hemiplegia and hemiparesis following cerebral infarction affecting left non-dominant side: Secondary | ICD-10-CM | POA: Diagnosis not present

## 2017-07-18 DIAGNOSIS — Z5181 Encounter for therapeutic drug level monitoring: Secondary | ICD-10-CM | POA: Diagnosis not present

## 2017-07-18 DIAGNOSIS — I951 Orthostatic hypotension: Secondary | ICD-10-CM | POA: Diagnosis not present

## 2017-07-18 LAB — POCT INR: INR: 2.2

## 2017-07-19 DIAGNOSIS — I951 Orthostatic hypotension: Secondary | ICD-10-CM | POA: Diagnosis not present

## 2017-07-19 DIAGNOSIS — I69354 Hemiplegia and hemiparesis following cerebral infarction affecting left non-dominant side: Secondary | ICD-10-CM | POA: Diagnosis not present

## 2017-07-20 DIAGNOSIS — I69354 Hemiplegia and hemiparesis following cerebral infarction affecting left non-dominant side: Secondary | ICD-10-CM | POA: Diagnosis not present

## 2017-07-20 DIAGNOSIS — I951 Orthostatic hypotension: Secondary | ICD-10-CM | POA: Diagnosis not present

## 2017-07-23 ENCOUNTER — Encounter: Payer: Self-pay | Admitting: Adult Health

## 2017-07-23 ENCOUNTER — Ambulatory Visit (INDEPENDENT_AMBULATORY_CARE_PROVIDER_SITE_OTHER): Payer: Medicare Other | Admitting: Adult Health

## 2017-07-23 VITALS — BP 154/65 | HR 59 | Ht 65.0 in | Wt 115.6 lb

## 2017-07-23 DIAGNOSIS — H5440 Blindness, one eye, unspecified eye: Secondary | ICD-10-CM

## 2017-07-23 DIAGNOSIS — I10 Essential (primary) hypertension: Secondary | ICD-10-CM

## 2017-07-23 DIAGNOSIS — I69359 Hemiplegia and hemiparesis following cerebral infarction affecting unspecified side: Secondary | ICD-10-CM

## 2017-07-23 DIAGNOSIS — I63411 Cerebral infarction due to embolism of right middle cerebral artery: Secondary | ICD-10-CM | POA: Diagnosis not present

## 2017-07-23 DIAGNOSIS — I480 Paroxysmal atrial fibrillation: Secondary | ICD-10-CM | POA: Diagnosis not present

## 2017-07-23 DIAGNOSIS — E785 Hyperlipidemia, unspecified: Secondary | ICD-10-CM

## 2017-07-23 NOTE — Progress Notes (Signed)
[facility] [address] [place]. [place] [address] [phone]       OFFICE FOLLOW UP NOTE  [name] Date of Birth:  Feb 04, 1929 Medical Record Number:  [id number]   Reason for Referral:  hospital TIA follow up  CHIEF COMPLAINT:  Chief Complaint  Patient presents with  . Follow-up    Patient has gradually been losing vision in her right eye.     HPI: Katrina Rodriguez is being seen today for initial visit in the office for TIA on 06/19/17. History obtained from patient and chart review. Reviewed all radiology images and labs personally.  [name] is a 84 y.o. female with history of prior stroke w/ L HP,  AF s/p ablation on warfarin, blind in L eye, shadows in R eye, CHB, and orthostatic hypotension who presented with decreased sensation and increased hemiparesis on her L side.  CT had reviewed and negative for acute abnormality.  CTA head negative for proximal stenosis or large vessel occlusion, visible developing infarction in the brain or abnormal postcontrast enhancement.  Unable to do MRI/MRA due to pacemaker.  Bilateral carotid Doppler showed bilateral ICA stenosis of 1 to 39%.  2D echo showed an EF of 60 to 65% without cardiac source.  LDL 134 and recommended to start Lipitor 40 mg.  Patient was on warfarin prior to admission for atrial fibrillation with an INR of 2.2.  Recommended to continue this at discharge as patient has not tolerated Xarelto or Eliquis in the past.  Patient discharged home with PT/OT recommendations in stable condition.  Since hospital discharge, patient has been doing well.  Patient is back to all prior activities and she continues home PT/OT for left hemiparesis.  Compliant with Coumadin and last INR check on 07/18/2017 was 2.2 and this was checked by Dr. Doug Sou office who is her cardiologist.  Recommended to start Lipitor at discharge but patient stated Dr. [name] did not want her to but we will ensure this at next week's appointment.   Patient is not currently on statin therapy.  Blood pressure today mildly elevated at 154/65 but this she is checked at home to therapies and SBP typically 120-130 per patient.  Denies new or worsening stroke/TIA symptoms.  ROS:   14 system review of systems performed and negative with exception of hearing loss, loss of vision, shortness of breath, chest pain, and constipation  PMH:  Past Medical History:  Diagnosis Date  . Anticoagulant long-term use   . Arthritis   . Atrial fibrillation (HCC)    History of ablation - Dr. Rayann Heman  . Chest pain    Has normal coronaries per cath in 2005; negative nuclear in 2010  . Chronic anemia   . H/O syncope   . H/O: CVA (cardiovascular accident)    Right internal capsule  . High risk medication use    Tikosyn & Coumadin  . Hyperthyroidism   . Mild pulmonary hypertension ([place])   . 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 Orthostasis   . Pacemaker   . Retinal detachment   . Tachycardia-bradycardia syndrome ([place])   . Thrombocytopenia ([place])   . Weakness of extremity 06/2017   left arm    PSH:  Past Surgical History:  Procedure Laterality Date  . CARDIAC CATHETERIZATION    . CARDIAC PACEMAKER PLACEMENT    . PERMANENT PACEMAKER GENERATOR CHANGE N/A 06/12/2012   Procedure: PERMANENT PACEMAKER GENERATOR CHANGE;  Surgeon: Evans Lance, MD;  Location:  [facility] CATH LAB;  Service: Cardiovascular;  Laterality: N/A;  . RETINAL DETACHMENT SURGERY      Social History:  Social History   Socioeconomic History  . Marital status: Widowed    Spouse name: Not on file  . Number of children: 2  . Years of education: 10  . Highest education level: Not on file  Occupational History  . Occupation: Scientist, research (physical sciences): RETIRED  Social Needs  . Financial resource strain: Not on file  . Food insecurity:    Worry: Not on file    Inability: Not on file  . Transportation needs:    Medical: Not on file    Non-medical: Not on file  Tobacco Use  . Smoking  status: Never Smoker  . Smokeless tobacco: Never Used  Substance and Sexual Activity  . Alcohol use: No  . Drug use: No  . Sexual activity: Never  Lifestyle  . Physical activity:    Days per week: Not on file    Minutes per session: Not on file  . Stress: Not on file  Relationships  . Social connections:    Talks on phone: Not on file    Gets together: Not on file    Attends religious service: Not on file    Active member of club or organization: Not on file    Attends meetings of clubs or organizations: Not on file    Relationship status: Not on file  . Intimate partner violence:    Fear of current or ex partner: Not on file    Emotionally abused: Not on file    Physically abused: Not on file    Forced sexual activity: Not on file  Other Topics Concern  . Not on file  Social History Narrative   Lives at home alone.   Caffeine use: Drinks coffee daily   Right handed     Family History:  Family History  Problem Relation Age of Onset  . Heart disease Father   . Stroke Father   . Heart attack Father   . Heart disease Brother   . Heart disease Brother   . Hypertension Mother   . Diabetes Sister   . Heart attack Sister     Medications:   Current Outpatient Medications on File Prior to Visit  Medication Sig Dispense Refill  . atorvastatin (LIPITOR) 40 MG tablet Take 1 tablet (40 mg total) by mouth daily at 6 PM. 30 tablet 0  . dofetilide (TIKOSYN) 250 MCG capsule Take 1 capsule (250 mcg total) by mouth 2 (two) times daily. 180 capsule 1  . fludrocortisone (FLORINEF) 0.1 MG tablet Take 1 tablet (0.1 mg total) by mouth 2 (two) times daily. 180 tablet 1  . potassium chloride SA (K-DUR,KLOR-CON) 20 MEQ tablet Take 2 tablets (40 mEq total) by mouth daily. 180 tablet 1  . prednisoLONE acetate (PRED FORTE) 1 % ophthalmic suspension PLACE 1 DROP IN BOTH EYES 4 TIMES DAILY  0  . warfarin (COUMADIN) 4 MG tablet Take 1/2 to 1 tablet by mouth  daily or as directed by  coumadin  clinic (Patient taking differently: Take 4 mg by mouth See admin instructions. Take 4mg  tab on  Monday and Friday,  1/2 tablet (2mg ) on Sunday, Tuesday, Wednesday, Thursday and Saturday.) 90 tablet 1   No current facility-administered medications on file prior to visit.     Allergies:   Allergies  Allergen Reactions  . Amiodarone Other (See Comments)    Caused thyroid problems  .  Amoxicillin Hives  . Aspirin Other (See Comments)    On tikosyn and warfarin  . Codeine Other (See Comments)    Causes BP to drop  . Darvocet [Propoxyphene N-Acetaminophen] Hives  . Demerol Itching  . Eliquis [Apixaban] Nausea Only  . Penicillins Hives  . Xarelto [Rivaroxaban] Rash     Physical Exam  Vitals:   07/23/17 1301  BP: (!) 154/65  Pulse: (!) 59  Weight: 115 lb 9.6 oz (52.4 kg)  Height: 5\' 5"  (1.651 m)   Body mass index is 19.24 kg/m. No exam data present  General: Frail pleasant elderly Caucasian female, seated, in no evident distress Head: head normocephalic and atraumatic.   Neck: supple with no carotid or supraclavicular bruits Cardiovascular: regular rate and rhythm, no murmurs Musculoskeletal: no deformity Skin:  no rash/petichiae Vascular:  Normal pulses all extremities  Neurologic Exam Mental Status: Awake and fully alert. Oriented to place and time. Recent and remote memory intact. Attention span, concentration and fund of knowledge appropriate. Mood and affect appropriate.  Cranial Nerves: Left eye blind at baseline but able to see shadows in the right eye.  Hard of hearing.  Facial sensation intact. Face, tongue, palate moves normally and symmetrically.  Motor: Right side 5/5 strength, LUE 4/5; LLE 4/5 Sensory.:  Mildly decreased vibratory sensation on left side compared to right side Coordination: Rapid alternating movements normal in all extremities. Finger-to-nose and heel-to-shin performed accurately bilaterally. Gait and Station: Arises from chair without difficulty.  Stance is normal. Gait demonstrates slow but stable steps with cane due to blindness Reflexes: 1+ and symmetric. Toes downgoing.    NIHSS  1 Modified Rankin  1 HAS-BLED 7 CHA2DS2-VASc 2   Diagnostic Data (Labs, Imaging, Testing)  CT HEAD WO CONTRAST 06/19/2017 IMPRESSION: Normal aging brain.  CT ANGIO HEAD W OR WO CONTRAST 06/21/2017 IMPRESSION: No proximal stenosis or large vessel occlusion. No visible developing infarction in the brain. No abnormal postcontrast enhancement.  ECHOCARDIOGRAM 06/20/2017 Study Conclusions - Left ventricle: The cavity size was normal. Wall thickness was   normal. Systolic function was normal. The estimated ejection   fraction was in the range of 60% to 65%. Wall motion was normal;   there were no regional wall motion abnormalities. There was no   evidence of elevated ventricular filling pressure by Doppler   parameters. - Aortic valve: Trileaflet; moderately thickened, moderately   calcified leaflets. Valve mobility was restricted. There was   moderate regurgitation. - Mitral valve: There was moderate regurgitation. - Left atrium: The atrium was mildly dilated. - Tricuspid valve: There was moderate regurgitation. - Pulmonary arteries: Systolic pressure was mildly increased. PA   peak pressure: 36 mm Hg (S). Impressions: - No cardiac source of emboli was indentified.  VAS US CAROTID DUPLEX BILATERAL 06/20/2017 Final Interpretation: Right Carotid: Velocities in the right ICA are consistent with a 1-39% stenosis. Left Carotid: Velocities in the left ICA are consistent with a 1-39% stenosis.    ASSESSMENT: Katrina Rodriguez is a 84 y.o. year old female here with TIA on 06/19/2017 secondary to atrial fibrillation. Vascular risk factors include atrial fibrillation, HTN and DM.    PLAN: -Continue warfarin daily  for secondary stroke prevention -advised patient to speak with dr. [name] regarding statin use for elevated LDL -f/u with cardiologist  regarding INR and coumadin management -continue home PT/OT -F/u with PCP regarding your HLD and HTN2 management -continue to monitor BP at home -Maintain strict control of hypertension with blood pressure goal below 130/90, diabetes  with hemoglobin A1c goal below 6.5% and cholesterol with LDL cholesterol (bad cholesterol) goal below 70 mg/dL. I also advised the patient to eat a healthy diet with plenty of whole grains, cereals, fruits and vegetables, exercise regularly and maintain ideal body weight.  Follow up in 3 months or call earlier if needed   Greater than 50% of time during this 25 minute visit was spent on counseling,explanation of diagnosis of TIA, reviewing risk factor management of HTN, HLD and a fib, planning of further management, discussion with patient and family and coordination of care   Venancio Poisson, [facility]  [facility] [address] [place], [place] [address]  Phone [phone] Fax [phone]

## 2017-07-23 NOTE — Progress Notes (Signed)
I have read the note, and I agree with the clinical assessment and plan.  Richard A. Sater, MD, PhD, FAAN Certified in Neurology, Clinical Neurophysiology, Sleep Medicine, Pain Medicine and Neuroimaging  [facility] [address], [address] [place], [place] [address] [phone]  

## 2017-07-23 NOTE — Patient Instructions (Signed)
Continue warfarin daily for secondary stroke prevention  Speak to your cardiologist regarding cholesterol medication and continue INR checks for coumadin maintainance  Continue to follow up with PCP regarding cholesterol and blood pressure management   Continue to monitor blood pressure at home  Maintain strict control of hypertension with blood pressure goal below 130/90, diabetes with hemoglobin A1c goal below 6.5% and cholesterol with LDL cholesterol (bad cholesterol) goal below 70 mg/dL. I also advised the patient to eat a healthy diet with plenty of whole grains, cereals, fruits and vegetables, exercise regularly and maintain ideal body weight.  Followup in the future with me in 3 months or call earlier if needed         Thank you for coming to see [place] at [facility]. I hope we have been able to provide you high quality care today.  You may receive a patient satisfaction survey over the next few weeks. We would appreciate your feedback and comments so that we may continue to improve ourselves and the health of our patients.

## 2017-07-25 ENCOUNTER — Telehealth: Payer: Self-pay | Admitting: Cardiology

## 2017-07-25 ENCOUNTER — Ambulatory Visit (INDEPENDENT_AMBULATORY_CARE_PROVIDER_SITE_OTHER): Payer: Medicare Other | Admitting: Pharmacist

## 2017-07-25 DIAGNOSIS — I480 Paroxysmal atrial fibrillation: Secondary | ICD-10-CM

## 2017-07-25 DIAGNOSIS — Z5181 Encounter for therapeutic drug level monitoring: Secondary | ICD-10-CM | POA: Diagnosis not present

## 2017-07-25 DIAGNOSIS — I951 Orthostatic hypotension: Secondary | ICD-10-CM | POA: Diagnosis not present

## 2017-07-25 DIAGNOSIS — I69354 Hemiplegia and hemiparesis following cerebral infarction affecting left non-dominant side: Secondary | ICD-10-CM | POA: Diagnosis not present

## 2017-07-25 LAB — POCT INR: INR: 2.2 (ref 2.0–3.0)

## 2017-07-25 NOTE — Telephone Encounter (Signed)
Agree with follow up. Based on prior evaluation risk for ACS is fairly low.  Nuha Degner [name] MD, [facility]

## 2017-07-25 NOTE — Telephone Encounter (Signed)
Spoke with Timmothy Sours from [facility] who states the pt reports experiencing cp on exertion. Pt states its been happening for a while and usually happen when she is pulling on the bar to get out of the tub or scrubing her floor. Pt states symptoms normal last about 30 sec and some times longer depending on how much she exerted herself but is relived  with rest. Pt reports this past Saturday the pain was worse than normal. She explained that it started in mid chest area and radiated to left neck/shoulder. She states, "it felt like headache put in my chest." Pt currently denies any active cp at the moment.    Pt was offered an appointment to see Dr. [name] today at 330 but states she is unable to find transportation. Appointment scheduled for 6/5 at 9 am. Pt also advise to report to ED if CP reoccur. Routing to Dr. [name] for further recommendation.

## 2017-07-25 NOTE — Telephone Encounter (Signed)
New message    Tommi Emery from [facility] calling to report chest pain lasting for about 30 seconds,DON JOHNSON REQUESTING CALL FROM NURSE  Pt c/o of Chest Pain: STAT if CP now or developed within 24 hours  1. Are you having CP right now? NO  2. Are you experiencing any other symptoms (ex. SOB, nausea, vomiting, sweating)? NO  3. How long have you been experiencing CP? N/A  4. Is your CP continuous or coming and going? Coming and going 5. Have you taken Nitroglycerin?  NO ?

## 2017-07-26 DIAGNOSIS — I69354 Hemiplegia and hemiparesis following cerebral infarction affecting left non-dominant side: Secondary | ICD-10-CM | POA: Diagnosis not present

## 2017-07-26 DIAGNOSIS — I951 Orthostatic hypotension: Secondary | ICD-10-CM | POA: Diagnosis not present

## 2017-07-31 ENCOUNTER — Ambulatory Visit (INDEPENDENT_AMBULATORY_CARE_PROVIDER_SITE_OTHER): Payer: Medicare Other | Admitting: *Deleted

## 2017-07-31 DIAGNOSIS — I69354 Hemiplegia and hemiparesis following cerebral infarction affecting left non-dominant side: Secondary | ICD-10-CM | POA: Diagnosis not present

## 2017-07-31 DIAGNOSIS — I442 Atrioventricular block, complete: Secondary | ICD-10-CM

## 2017-07-31 DIAGNOSIS — I951 Orthostatic hypotension: Secondary | ICD-10-CM | POA: Diagnosis not present

## 2017-07-31 NOTE — Progress Notes (Signed)
Remote pacemaker transmission.   

## 2017-08-02 LAB — CUP PACEART REMOTE DEVICE CHECK
Battery Remaining Longevity: 82 mo
Brady Statistic AP VP Percent: 0 %
Brady Statistic AS VS Percent: 37 %
Date Time Interrogation Session: 20190528122530
Implantable Lead Implant Date: 20040309
Implantable Lead Location: 753860
Implantable Lead Serial Number: 304362
Implantable Pulse Generator Implant Date: 20140409
Lead Channel Impedance Value: 1095 Ohm
Lead Channel Impedance Value: 706 Ohm
Lead Channel Pacing Threshold Amplitude: 0.5 V
Lead Channel Pacing Threshold Pulse Width: 0.4 ms
Lead Channel Setting Pacing Amplitude: 2 V
Lead Channel Setting Sensing Sensitivity: 5.6 mV
MDC IDC LEAD IMPLANT DT: 20040309
MDC IDC LEAD LOCATION: 753859
MDC IDC MSMT BATTERY IMPEDANCE: 643 Ohm
MDC IDC MSMT BATTERY VOLTAGE: 2.78 V
MDC IDC MSMT LEADCHNL RA PACING THRESHOLD AMPLITUDE: 0.625 V
MDC IDC MSMT LEADCHNL RV PACING THRESHOLD PULSEWIDTH: 0.4 ms
MDC IDC SET LEADCHNL RV PACING AMPLITUDE: 2.5 V
MDC IDC SET LEADCHNL RV PACING PULSEWIDTH: 0.4 ms
MDC IDC STAT BRADY AP VS PERCENT: 63 %
MDC IDC STAT BRADY AS VP PERCENT: 0 %

## 2017-08-03 ENCOUNTER — Encounter: Payer: Self-pay | Admitting: Cardiology

## 2017-08-05 NOTE — Progress Notes (Signed)
[facility]   Katrina Rodriguez Date of Birth: [birth year] Medical Record #[id number]  History of Present Illness: Katrina Rodriguez is seen today for follow up of syncope. She has a history of atrial fibrillation. She is on chronic Tikosyn therapy. She has had a previous atrial fibrillation ablation. She has a history of recurrent syncope. She was noted to have intermittent complete heart block. Her pacemaker was reprogrammed to DDD with AV search to minimize V pacing.  She has a history of chronic chest pain with normal coronary anatomy by cardiac catheterization. She had a normal nuclear stress test 2/13. She has known sensitivity with V pacing with chest pain.   Pacemaker last checked November 26,2018. Afib burden <0.1%.   She was admitted April 16-20, 2019 with left sided weakness and diagnosed with TIA. CT head showed normal aging brain. CTA head showed no proximal stenosis or large vessel occlusion. MRI could not be obtained as patient has pacemaker. Echocardiogram showed an EF of 60-65%, no cardiac source of emboli was identified.  Wall motion was normal, there were no regional wall motion abnormalities. Carotid ultrasound: 1-39% ICA stenosis bilaterally. LDL 134, hemoglobin A1c was not done. Neurology consulted and appreciated, likely presented with TIA with embolic etiology from atrial fibrillation despite optimal anticoagulation.  She has been on Eliquis and Xarelto in the past and was not tolerated, would like to remain on Coumadin. She also had complaints of chest pressure but troponins were normal.  Pacemaker check 08/01/17 showed normal pacemaker function.  On follow up today she still has weakness on her left side. She states she has completed home PT. Still lives by herself. Has refused SNF. Gets around as best she can. No chest pain or dyspnea. No palpitations.  Current Outpatient Medications on File Prior to Visit  Medication Sig Dispense Refill  . dofetilide (TIKOSYN) 250 MCG capsule Take 1 capsule (250 mcg  total) by mouth 2 (two) times daily. 180 capsule 1  . fludrocortisone (FLORINEF) 0.1 MG tablet Take 1 tablet (0.1 mg total) by mouth 2 (two) times daily. 180 tablet 1  . potassium chloride SA (K-DUR,KLOR-CON) 20 MEQ tablet Take 2 tablets (40 mEq total) by mouth daily. 180 tablet 1  . warfarin (COUMADIN) 4 MG tablet Take 1/2 to 1 tablet by mouth  daily or as directed by  coumadin clinic (Patient taking differently: Take 4 mg by mouth See admin instructions. Take 4mg  tab on  Monday and Friday,  1/2 tablet (2mg ) on Sunday, Tuesday, Wednesday, Thursday and Saturday.) 90 tablet 1   No current facility-administered medications on file prior to visit.     Allergies  Allergen Reactions  . Amiodarone Other (See Comments)    Caused thyroid problems  . Amoxicillin Hives  . Aspirin Other (See Comments)    On tikosyn and warfarin  . Codeine Other (See Comments)    Causes BP to drop  . Darvocet [Propoxyphene N-Acetaminophen] Hives  . Demerol Itching  . Eliquis [Apixaban] Nausea Only  . Penicillins Hives  . Xarelto [Rivaroxaban] Rash    Past Medical History:  Diagnosis Date  . Anticoagulant long-term use   . Arthritis   . Atrial fibrillation (HCC)    History of ablation - Dr. Rayann Heman  . Chest pain    Has normal coronaries per cath in 2005; negative nuclear in 2010  . Chronic anemia   . Complete heart block ([place]) 06/09/2013  . H/O syncope   . H/O: CVA (cardiovascular accident)    Right internal capsule  .  High risk medication use    Tikosyn & Coumadin  . Hyperthyroidism   . Hypothyroidism 06/19/2017  . Left-sided weakness 06/19/2017  . Mild pulmonary hypertension ([place])   . 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 Orthostasis   . Pacemaker   . Retinal detachment   . Syncope 04/24/2013  . Tachycardia-bradycardia syndrome ([place])   . Thrombocytopenia ([place])   . Weakness of extremity 06/2017   left arm    Past Surgical History:  Procedure Laterality Date  . CARDIAC CATHETERIZATION    .  CARDIAC PACEMAKER PLACEMENT    . PERMANENT PACEMAKER GENERATOR CHANGE N/A 06/12/2012   Procedure: PERMANENT PACEMAKER GENERATOR CHANGE;  Surgeon: Evans Lance, MD;  Location: [facility] CATH LAB;  Service: Cardiovascular;  Laterality: N/A;  . RETINAL DETACHMENT SURGERY      Social History   Tobacco Use  Smoking Status Never Smoker  Smokeless Tobacco Never Used    Social History   Substance and Sexual Activity  Alcohol Use No    Family History  Problem Relation Age of Onset  . Heart disease Father   . Stroke Father   . Heart attack Father   . Heart disease Brother   . Heart disease Brother   . Hypertension Mother   . Diabetes Sister   . Heart attack Sister     Review of Systems: The review of systems is positive for  vision loss.  All other systems were reviewed and are negative.  Physical Exam: BP (!) 150/68   Pulse 60   Ht 5\' 5"  (1.651 m)   Wt 113 lb (51.3 kg)   BMI 18.80 kg/m  GENERAL:  Well appearing, elderly WF in NAD. Walks with a cane. HEENT:  PERRL, EOMI, sclera are clear. Oropharynx is clear. NECK:  No jugular venous distention, carotid upstroke brisk and symmetric, no bruits, no thyromegaly or adenopathy LUNGS:  Clear to auscultation bilaterally CHEST:  Unremarkable HEART:  RRR,  PMI not displaced or sustained,S1 and S2 within normal limits, no S3, no S4: no clicks, no rubs, no murmurs ABD:  Soft, nontender. BS +, no masses or bruits. No hepatomegaly, no splenomegaly EXT:  2 + pulses throughout, no edema, no cyanosis no clubbing SKIN:  Warm and dry.  No rashes NEURO:  Alert and oriented x 3. Cranial nerves II through XII intact. Walks with a cane. Poor vision. 4/5 strength left side.  PSYCH:  Cognitively intact     LABORATORY DATA:  Lab Results  Component Value Date   WBC 4.9 06/20/2017   HGB 13.5 06/20/2017   HCT 41.5 06/20/2017   PLT 114 (L) 06/20/2017   GLUCOSE 90 06/20/2017   CHOL 206 (H) 06/20/2017   TRIG 90 06/20/2017   HDL 54 06/20/2017    LDLCALC 134 (H) 06/20/2017   ALT 7 12/14/2016   AST 20 12/14/2016   NA 140 06/20/2017   K 4.2 06/20/2017   CL 110 06/20/2017   CREATININE 0.98 06/20/2017   BUN 18 06/20/2017   CO2 23 06/20/2017   TSH 1.04 09/16/2012   INR 2.0 08/08/2017   HGBA1C 5.8 (H) 06/12/2012   Dated 07/09/17: Normal CBC, CMET, TSH  INR 2.0 today.  Echo 06/20/17: Study Conclusions  - Left ventricle: The cavity size was normal. Wall thickness was   normal. Systolic function was normal. The estimated ejection   fraction was in the range of 60% to 65%. Wall motion was normal;   there were no regional wall motion abnormalities. There  was no   evidence of elevated ventricular filling pressure by Doppler   parameters. - Aortic valve: Trileaflet; moderately thickened, moderately   calcified leaflets. Valve mobility was restricted. There was   moderate regurgitation. - Mitral valve: There was moderate regurgitation. - Left atrium: The atrium was mildly dilated. - Tricuspid valve: There was moderate regurgitation. - Pulmonary arteries: Systolic pressure was mildly increased. PA   peak pressure: 36 mm Hg (S).  Impressions:  - No cardiac source of emboli was indentified  Ecg today shows atrial paced rhythm. Rate 60. ? Old septal infarct. I have personally reviewed and interpreted this study.   Assessment / Plan: 1. Atrial fibrillation. She appears to be maintaining sinus rhythm well on Tikosyn. Status post ablation.  Currently on Coumadin. INR therapeutic  today.She was unable to afford NOACs. She needs to stay on Tikosyn long term.   2. Chronic chest pain. Ischemic evaluation has been negative. Some chest pain related to V pacing. Currently doing well.  3. History of CVA in past. More recent TIA in April. Still has persistent left sided weakness. Reports she has a standing DNR order.   On chronic coumadin. Will start statin therapy.  4. Sick sinus syndrome and CHB status post pacemaker implant.  Patient has  regular followup in pacemaker clinic.  5. Recurrent syncope, multifactorial. She is on chronic Florinef. Continue to liberalize salt and wear support hose. There is little else to offer. I am afraid with her vision loss she will become more dependent on others and this is her great fear.   6. Hypercholesterolemia. Will add Crestor 10 mg daily. Follow up lab in 3 months.

## 2017-08-08 ENCOUNTER — Ambulatory Visit (INDEPENDENT_AMBULATORY_CARE_PROVIDER_SITE_OTHER): Payer: Medicare Other | Admitting: Cardiology

## 2017-08-08 ENCOUNTER — Ambulatory Visit (INDEPENDENT_AMBULATORY_CARE_PROVIDER_SITE_OTHER): Payer: Medicare Other | Admitting: Pharmacist Clinician (PhC)/ Clinical Pharmacy Specialist

## 2017-08-08 ENCOUNTER — Encounter: Payer: Self-pay | Admitting: Cardiology

## 2017-08-08 VITALS — BP 150/68 | HR 60 | Ht 65.0 in | Wt 113.0 lb

## 2017-08-08 DIAGNOSIS — I442 Atrioventricular block, complete: Secondary | ICD-10-CM

## 2017-08-08 DIAGNOSIS — I63411 Cerebral infarction due to embolism of right middle cerebral artery: Secondary | ICD-10-CM | POA: Diagnosis not present

## 2017-08-08 DIAGNOSIS — Z8673 Personal history of transient ischemic attack (TIA), and cerebral infarction without residual deficits: Secondary | ICD-10-CM

## 2017-08-08 DIAGNOSIS — Z5181 Encounter for therapeutic drug level monitoring: Secondary | ICD-10-CM

## 2017-08-08 DIAGNOSIS — E7800 Pure hypercholesterolemia, unspecified: Secondary | ICD-10-CM

## 2017-08-08 DIAGNOSIS — I4891 Unspecified atrial fibrillation: Secondary | ICD-10-CM

## 2017-08-08 DIAGNOSIS — I480 Paroxysmal atrial fibrillation: Secondary | ICD-10-CM | POA: Diagnosis not present

## 2017-08-08 LAB — POCT INR: INR: 2 (ref 2.0–3.0)

## 2017-08-08 MED ORDER — ROSUVASTATIN CALCIUM 10 MG PO TABS: 10.0000 mg | ORAL_TABLET | Freq: Every day | ORAL | 3 refills | Status: DC

## 2017-08-08 NOTE — Patient Instructions (Signed)
Description   Continue with 1 tablet each Monday and Friday, 1/2 tablet all other days. Repeat INR in 4 weeks.     

## 2017-08-08 NOTE — Patient Instructions (Signed)
We will add Crestor 10 mg daily  Continue your other therapy  We will repeat blood work in 3 months

## 2017-08-19 NOTE — Progress Notes (Signed)
Electrophysiology Office Note Date: 08/20/2017  ID:  Katrina, Rodriguez 01/29/1929, MRN [id number]  PCP: Leighton Ruff, MD Primary Cardiologist: Katrina Rodriguez Electrophysiologist: Allred  CC: Pacemaker follow-up  Katrina Rodriguez is a 84 y.o. female seen today for Dr Katrina Rodriguez.  She presents today for routine electrophysiology followup.  Since last being seen in our clinic, the patient reports doing reasonably well.  She had a TIA since I saw her last.  She remains on Warfarin.  She continues to struggle with loss of vision. She has not had significant AF since last being seen.  She denies chest pain, palpitations, dyspnea, PND, orthopnea, nausea, vomiting, dizziness, syncope, edema, weight gain, or early satiety.  Device History: MDT dual chamber PPM implanted 2004 for symptomatic bradycardia, gen change 2014  Past Medical History:  Diagnosis Date  . Arthritis   . Atrial fibrillation (HCC)    History of ablation - Dr. Rayann Rodriguez  . Chest pain    Has normal coronaries per cath in 2005; negative nuclear in 2010  . Chronic anemia   . Complete heart block ([place]) 06/09/2013  . H/O: CVA (cardiovascular accident)    Right internal capsule  . Hypothyroidism 06/19/2017  . Left-sided weakness 06/19/2017  . Mild pulmonary hypertension ([place])   . 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 Orthostasis   . Retinal detachment   . Thrombocytopenia ([place])   . TIA (transient ischemic attack)    Past Surgical History:  Procedure Laterality Date  . CARDIAC CATHETERIZATION    . CARDIAC PACEMAKER PLACEMENT    . PERMANENT PACEMAKER GENERATOR CHANGE N/A 06/12/2012   Procedure: PERMANENT PACEMAKER GENERATOR CHANGE;  Surgeon: Katrina Lance, MD;  Location: [facility] CATH LAB;  Service: Cardiovascular;  Laterality: N/A;  . RETINAL DETACHMENT SURGERY      Current Outpatient Medications  Medication Sig Dispense Refill  . dofetilide (TIKOSYN) 250 MCG capsule Take 1 capsule (250 mcg total) by mouth 2 (two) times daily. 180 capsule  1  . fludrocortisone (FLORINEF) 0.1 MG tablet Take 1 tablet (0.1 mg total) by mouth 2 (two) times daily. 180 tablet 1  . potassium chloride SA (K-DUR,KLOR-CON) 20 MEQ tablet Take 2 tablets (40 mEq total) by mouth daily. 180 tablet 1  . rosuvastatin (CRESTOR) 10 MG tablet Take 1 tablet (10 mg total) by mouth daily. 90 tablet 3  . warfarin (COUMADIN) 4 MG tablet Take 1/2 to 1 tablet by mouth  daily or as directed by  coumadin clinic (Patient taking differently: Take 4 mg by mouth See admin instructions. Take 4mg  tab on  Monday and Friday,  1/2 tablet (2mg ) on Sunday, Tuesday, Wednesday, Thursday and Saturday.) 90 tablet 1   No current facility-administered medications for this visit.     Allergies:   Amiodarone; Amoxicillin; Aspirin; Codeine; Darvocet [propoxyphene n-acetaminophen]; Demerol; Eliquis [apixaban]; Penicillins; and Xarelto [rivaroxaban]   Social History: Social History   Socioeconomic History  . Marital status: Widowed    Spouse name: Not on file  . Number of children: 2  . Years of education: 10  . Highest education level: Not on file  Occupational History  . Occupation: Scientist, research (physical sciences): RETIRED  Social Needs  . Financial resource strain: Not on file  . Food insecurity:    Worry: Not on file    Inability: Not on file  . Transportation needs:    Medical: Not on file    Non-medical: Not on file  Tobacco Use  . Smoking status: Never  Smoker  . Smokeless tobacco: Never Used  Substance and Sexual Activity  . Alcohol use: No  . Drug use: No  . Sexual activity: Never  Lifestyle  . Physical activity:    Days per week: Not on file    Minutes per session: Not on file  . Stress: Not on file  Relationships  . Social connections:    Talks on phone: Not on file    Gets together: Not on file    Attends religious service: Not on file    Active member of club or organization: Not on file    Attends meetings of clubs or organizations: Not on file    Relationship  status: Not on file  . Intimate partner violence:    Fear of current or ex partner: Not on file    Emotionally abused: Not on file    Physically abused: Not on file    Forced sexual activity: Not on file  Other Topics Concern  . Not on file  Social History Narrative   Lives at home alone.   Caffeine use: Drinks coffee daily   Right handed     Family History: Family History  Problem Relation Age of Onset  . Heart disease Father   . Stroke Father   . Heart attack Father   . Heart disease Brother   . Heart disease Brother   . Hypertension Mother   . Diabetes Sister   . Heart attack Sister      Review of Systems: All other systems reviewed and are otherwise negative except as noted above.   Physical Exam: VS:  BP 130/70   Pulse 60   Ht 5\' 5"  (1.651 m)   Wt 114 lb (51.7 kg)   SpO2 95%   BMI 18.97 kg/m  , BMI Body mass index is 18.97 kg/m.  GEN- The patient is elderly and frail appearing, alert and oriented x 3 today.   HEENT: normocephalic, atraumatic; sclera clear, conjunctiva pink; hearing intact; oropharynx clear; neck supple  Lungs- Clear to ausculation bilaterally, normal work of breathing.  No wheezes, rales, rhonchi Heart- Regular rate and rhythm  GI- soft, non-tender, non-distended, bowel sounds present  Extremities- no clubbing, cyanosis, or edema  MS- no significant deformity or atrophy Skin- warm and dry, no rash or lesion; PPM pocket well healed Psych- euthymic mood, full affect Neuro- strength and sensation are intact  PPM Interrogation- reviewed in detail today,  See PACEART report  EKG:  EKG is not ordered today.  Recent Labs: 12/14/2016: ALT 7 06/20/2017: BUN 18; Creatinine, Ser 0.98; Hemoglobin 13.5; Platelets 114; Potassium 4.2; Sodium 140   Wt Readings from Last 3 Encounters:  08/20/17 114 lb (51.7 kg)  08/08/17 113 lb (51.3 kg)  07/23/17 115 lb 9.6 oz (52.4 kg)     Other studies Reviewed: Additional studies/ records that were reviewed  today include: Dr Katrina Rodriguez and Dr Jackalyn Lombard office notes  Assessment and Plan:  1.  Symptomatic bradycardia Normal PPM function See Pace Art report No changes today  2.  Persistent atrial fibrillation Doing well on Tikosyn QTc stable on EKG 08/09/17 BMET, Mg today Continue Warfarin for CHADS2VASC of 7  3. Orthostatic hypotension Continue Florinef   Current medicines are reviewed at length with the patient today.   The patient does not have concerns regarding her medicines.  The following changes were made today:  none  Labs/ tests ordered today include: BMET, Mg Orders Placed This Encounter  Procedures  . Basic metabolic  panel  . Magnesium  . CUP PACEART [place]  . EKG 12-Lead     Disposition:   Follow up with [facility], Dr Katrina Rodriguez as scheduled, me in 6 months    Signed, Chanetta Marshall, NP 08/20/2017 2:30 PM  [address] [place] [address] [phone] (office) [phone] (fax)

## 2017-08-20 ENCOUNTER — Encounter: Payer: Self-pay | Admitting: Nurse Practitioner

## 2017-08-20 ENCOUNTER — Ambulatory Visit (INDEPENDENT_AMBULATORY_CARE_PROVIDER_SITE_OTHER): Payer: Medicare Other | Admitting: Nurse Practitioner

## 2017-08-20 VITALS — BP 130/70 | HR 60 | Ht 65.0 in | Wt 114.0 lb

## 2017-08-20 DIAGNOSIS — R001 Bradycardia, unspecified: Secondary | ICD-10-CM | POA: Diagnosis not present

## 2017-08-20 DIAGNOSIS — I951 Orthostatic hypotension: Secondary | ICD-10-CM

## 2017-08-20 DIAGNOSIS — I481 Persistent atrial fibrillation: Secondary | ICD-10-CM | POA: Diagnosis not present

## 2017-08-20 DIAGNOSIS — I4819 Other persistent atrial fibrillation: Secondary | ICD-10-CM

## 2017-08-20 DIAGNOSIS — I63411 Cerebral infarction due to embolism of right middle cerebral artery: Secondary | ICD-10-CM

## 2017-08-20 LAB — CUP PACEART INCLINIC DEVICE CHECK
Implantable Lead Location: 753860
Implantable Pulse Generator Implant Date: 20140409
MDC IDC LEAD IMPLANT DT: 20040309
MDC IDC LEAD IMPLANT DT: 20040309
MDC IDC LEAD LOCATION: 753859
MDC IDC LEAD SERIAL: 304362
MDC IDC SESS DTM: 20190617121346

## 2017-08-20 NOTE — Patient Instructions (Addendum)
Medication Instructions:    Your physician recommends that you continue on your current medications as directed. Please refer to the Current Medication list given to you today.   If you need a refill on your cardiac medications before your next appointment, please call your pharmacy.  Labwork: BMET AND MAG TODAY    Testing/Procedures: NONE ORDERED  TODAY    Follow-Up: Your physician wants you to follow-up in:  [place] will receive a reminder letter in the mail two months in advance. If you don't receive a letter, please call our office to schedule the follow-up appointment.   Remote monitoring is used to monitor your Pacemaker of ICD from home. This monitoring reduces the number of office visits required to check your device to one time per year. It allows [place] to keep an eye on the functioning of your device to ensure it is working properly. You are scheduled for a device check from home on . 10-30-17 You may send your transmission at any time that day. If you have a wireless device, the transmission will be sent automatically. After your physician reviews your transmission, you will receive a postcard with your next transmission date.     Any Other Special Instructions Will Be Listed Below (If Applicable).

## 2017-08-21 LAB — MAGNESIUM: Magnesium: 2.4 mg/dL — ABNORMAL HIGH (ref 1.6–2.3)

## 2017-08-21 LAB — BASIC METABOLIC PANEL
BUN/Creatinine Ratio: 20 (ref 12–28)
BUN: 24 mg/dL (ref 8–27)
CO2: 23 mmol/L (ref 20–29)
Calcium: 8.8 mg/dL (ref 8.7–10.3)
Chloride: 105 mmol/L (ref 96–106)
Creatinine, Ser: 1.18 mg/dL — ABNORMAL HIGH (ref 0.57–1.00)
GFR, EST AFRICAN AMERICAN: 48 mL/min/{1.73_m2} — AB (ref >59)
GFR, EST NON AFRICAN AMERICAN: 42 mL/min/{1.73_m2} — AB (ref >59)
Glucose: 82 mg/dL (ref 65–99)
POTASSIUM: 4.4 mmol/L (ref 3.5–5.2)
SODIUM: 145 mmol/L — AB (ref 134–144)

## 2017-09-05 ENCOUNTER — Ambulatory Visit (INDEPENDENT_AMBULATORY_CARE_PROVIDER_SITE_OTHER): Payer: Medicare Other | Admitting: Pharmacist

## 2017-09-05 DIAGNOSIS — Z5181 Encounter for therapeutic drug level monitoring: Secondary | ICD-10-CM | POA: Diagnosis not present

## 2017-09-05 DIAGNOSIS — I4891 Unspecified atrial fibrillation: Secondary | ICD-10-CM | POA: Diagnosis not present

## 2017-09-05 LAB — POCT INR: INR: 2.9 (ref 2.0–3.0)

## 2017-09-17 ENCOUNTER — Ambulatory Visit: Payer: Medicare Other | Admitting: Cardiology

## 2017-09-20 ENCOUNTER — Ambulatory Visit: Payer: Medicare Other | Admitting: Cardiology

## 2017-10-05 ENCOUNTER — Ambulatory Visit (INDEPENDENT_AMBULATORY_CARE_PROVIDER_SITE_OTHER): Payer: Medicare Other | Admitting: Pharmacist

## 2017-10-05 DIAGNOSIS — I4891 Unspecified atrial fibrillation: Secondary | ICD-10-CM | POA: Diagnosis not present

## 2017-10-05 DIAGNOSIS — Z5181 Encounter for therapeutic drug level monitoring: Secondary | ICD-10-CM | POA: Diagnosis not present

## 2017-10-05 LAB — POCT INR: INR: 2.8 (ref 2.0–3.0)

## 2017-10-17 ENCOUNTER — Other Ambulatory Visit: Payer: Self-pay | Admitting: Cardiology

## 2017-10-29 ENCOUNTER — Ambulatory Visit: Payer: Medicare Other | Admitting: Adult Health

## 2017-10-30 ENCOUNTER — Ambulatory Visit (INDEPENDENT_AMBULATORY_CARE_PROVIDER_SITE_OTHER): Payer: Medicare Other | Admitting: *Deleted

## 2017-10-30 DIAGNOSIS — I442 Atrioventricular block, complete: Secondary | ICD-10-CM | POA: Diagnosis not present

## 2017-10-30 NOTE — Progress Notes (Signed)
Remote pacemaker transmission.   

## 2017-10-31 ENCOUNTER — Ambulatory Visit (INDEPENDENT_AMBULATORY_CARE_PROVIDER_SITE_OTHER): Payer: Medicare Other | Admitting: Pharmacist Clinician (PhC)/ Clinical Pharmacy Specialist

## 2017-10-31 DIAGNOSIS — E7800 Pure hypercholesterolemia, unspecified: Secondary | ICD-10-CM | POA: Diagnosis not present

## 2017-10-31 DIAGNOSIS — I4891 Unspecified atrial fibrillation: Secondary | ICD-10-CM

## 2017-10-31 DIAGNOSIS — Z8673 Personal history of transient ischemic attack (TIA), and cerebral infarction without residual deficits: Secondary | ICD-10-CM | POA: Diagnosis not present

## 2017-10-31 DIAGNOSIS — I480 Paroxysmal atrial fibrillation: Secondary | ICD-10-CM | POA: Diagnosis not present

## 2017-10-31 DIAGNOSIS — Z5181 Encounter for therapeutic drug level monitoring: Secondary | ICD-10-CM

## 2017-10-31 DIAGNOSIS — I442 Atrioventricular block, complete: Secondary | ICD-10-CM | POA: Diagnosis not present

## 2017-10-31 LAB — LIPID PANEL W/O CHOL/HDL RATIO
CHOLESTEROL TOTAL: 139 mg/dL (ref 100–199)
HDL: 60 mg/dL (ref >39)
LDL Calculated: 61 mg/dL (ref 0–99)
TRIGLYCERIDES: 90 mg/dL (ref 0–149)
VLDL Cholesterol Cal: 18 mg/dL (ref 5–40)

## 2017-10-31 LAB — HEPATIC FUNCTION PANEL
ALK PHOS: 64 IU/L (ref 39–117)
ALT: 9 IU/L (ref 0–32)
AST: 17 IU/L (ref 0–40)
Albumin: 4.2 g/dL (ref 3.5–4.7)
BILIRUBIN, DIRECT: 0.16 mg/dL (ref 0.00–0.40)
Bilirubin Total: 0.6 mg/dL (ref 0.0–1.2)
TOTAL PROTEIN: 6.2 g/dL (ref 6.0–8.5)

## 2017-10-31 LAB — POCT INR: INR: 3.5 — AB (ref 2.0–3.0)

## 2017-11-01 ENCOUNTER — Encounter: Payer: Self-pay | Admitting: Cardiology

## 2017-11-14 LAB — CUP PACEART REMOTE DEVICE CHECK
Battery Voltage: 2.78 V
Brady Statistic AP VS Percent: 69 %
Brady Statistic AS VS Percent: 30 %
Date Time Interrogation Session: 20190827154056
Implantable Lead Implant Date: 20040309
Implantable Lead Location: 753859
Implantable Lead Serial Number: 304362
Lead Channel Pacing Threshold Amplitude: 0.5 V
Lead Channel Pacing Threshold Pulse Width: 0.4 ms
Lead Channel Pacing Threshold Pulse Width: 0.4 ms
Lead Channel Setting Pacing Pulse Width: 0.4 ms
Lead Channel Setting Sensing Sensitivity: 5.6 mV
MDC IDC LEAD IMPLANT DT: 20040309
MDC IDC LEAD LOCATION: 753860
MDC IDC MSMT BATTERY IMPEDANCE: 719 Ohm
MDC IDC MSMT BATTERY REMAINING LONGEVITY: 78 mo
MDC IDC MSMT LEADCHNL RA IMPEDANCE VALUE: 1067 Ohm
MDC IDC MSMT LEADCHNL RA PACING THRESHOLD AMPLITUDE: 0.75 V
MDC IDC MSMT LEADCHNL RV IMPEDANCE VALUE: 635 Ohm
MDC IDC PG IMPLANT DT: 20140409
MDC IDC SET LEADCHNL RA PACING AMPLITUDE: 2 V
MDC IDC SET LEADCHNL RV PACING AMPLITUDE: 2.5 V
MDC IDC STAT BRADY AP VP PERCENT: 0 %
MDC IDC STAT BRADY AS VP PERCENT: 0 %

## 2017-11-19 DIAGNOSIS — H4720 Unspecified optic atrophy: Secondary | ICD-10-CM | POA: Diagnosis not present

## 2017-11-19 DIAGNOSIS — H1859 Other hereditary corneal dystrophies: Secondary | ICD-10-CM | POA: Diagnosis not present

## 2017-11-19 DIAGNOSIS — D3131 Benign neoplasm of right choroid: Secondary | ICD-10-CM | POA: Diagnosis not present

## 2017-11-19 DIAGNOSIS — H1851 Endothelial corneal dystrophy: Secondary | ICD-10-CM | POA: Diagnosis not present

## 2017-11-19 DIAGNOSIS — H01021 Squamous blepharitis right upper eyelid: Secondary | ICD-10-CM | POA: Diagnosis not present

## 2017-11-19 DIAGNOSIS — Z961 Presence of intraocular lens: Secondary | ICD-10-CM | POA: Diagnosis not present

## 2017-11-19 DIAGNOSIS — H31092 Other chorioretinal scars, left eye: Secondary | ICD-10-CM | POA: Diagnosis not present

## 2017-11-19 DIAGNOSIS — H04123 Dry eye syndrome of bilateral lacrimal glands: Secondary | ICD-10-CM | POA: Diagnosis not present

## 2017-11-19 DIAGNOSIS — H01022 Squamous blepharitis right lower eyelid: Secondary | ICD-10-CM | POA: Diagnosis not present

## 2017-11-19 DIAGNOSIS — H01025 Squamous blepharitis left lower eyelid: Secondary | ICD-10-CM | POA: Diagnosis not present

## 2017-11-19 DIAGNOSIS — H01024 Squamous blepharitis left upper eyelid: Secondary | ICD-10-CM | POA: Diagnosis not present

## 2017-11-28 ENCOUNTER — Ambulatory Visit (INDEPENDENT_AMBULATORY_CARE_PROVIDER_SITE_OTHER): Payer: Medicare Other | Admitting: Pharmacist

## 2017-11-28 DIAGNOSIS — I4891 Unspecified atrial fibrillation: Secondary | ICD-10-CM | POA: Diagnosis not present

## 2017-11-28 DIAGNOSIS — Z5181 Encounter for therapeutic drug level monitoring: Secondary | ICD-10-CM | POA: Diagnosis not present

## 2017-11-28 LAB — POCT INR: INR: 2.8 (ref 2.0–3.0)

## 2017-11-29 ENCOUNTER — Encounter (INDEPENDENT_AMBULATORY_CARE_PROVIDER_SITE_OTHER): Payer: Medicare Other | Admitting: Ophthalmology

## 2017-11-29 DIAGNOSIS — I10 Essential (primary) hypertension: Secondary | ICD-10-CM

## 2017-11-29 DIAGNOSIS — H338 Other retinal detachments: Secondary | ICD-10-CM

## 2017-11-29 DIAGNOSIS — D3131 Benign neoplasm of right choroid: Secondary | ICD-10-CM | POA: Diagnosis not present

## 2017-11-29 DIAGNOSIS — H353111 Nonexudative age-related macular degeneration, right eye, early dry stage: Secondary | ICD-10-CM | POA: Diagnosis not present

## 2017-11-29 DIAGNOSIS — H35033 Hypertensive retinopathy, bilateral: Secondary | ICD-10-CM | POA: Diagnosis not present

## 2017-11-29 DIAGNOSIS — H43813 Vitreous degeneration, bilateral: Secondary | ICD-10-CM

## 2017-12-28 ENCOUNTER — Ambulatory Visit (INDEPENDENT_AMBULATORY_CARE_PROVIDER_SITE_OTHER): Payer: Medicare Other | Admitting: Pharmacist

## 2017-12-28 DIAGNOSIS — I4891 Unspecified atrial fibrillation: Secondary | ICD-10-CM

## 2017-12-28 DIAGNOSIS — Z5181 Encounter for therapeutic drug level monitoring: Secondary | ICD-10-CM

## 2017-12-28 LAB — POCT INR: INR: 2.7 (ref 2.0–3.0)

## 2017-12-28 MED ORDER — WARFARIN SODIUM 4 MG PO TABS: ORAL_TABLET | ORAL | 1 refills | Status: DC

## 2018-01-07 ENCOUNTER — Encounter: Payer: Self-pay | Admitting: Adult Health

## 2018-01-07 ENCOUNTER — Ambulatory Visit (INDEPENDENT_AMBULATORY_CARE_PROVIDER_SITE_OTHER): Payer: Medicare Other | Admitting: Adult Health

## 2018-01-07 VITALS — BP 143/67 | HR 64 | Ht 65.0 in | Wt 113.0 lb

## 2018-01-07 DIAGNOSIS — G459 Transient cerebral ischemic attack, unspecified: Secondary | ICD-10-CM | POA: Diagnosis not present

## 2018-01-07 DIAGNOSIS — I480 Paroxysmal atrial fibrillation: Secondary | ICD-10-CM | POA: Diagnosis not present

## 2018-01-07 DIAGNOSIS — I10 Essential (primary) hypertension: Secondary | ICD-10-CM | POA: Diagnosis not present

## 2018-01-07 DIAGNOSIS — E785 Hyperlipidemia, unspecified: Secondary | ICD-10-CM

## 2018-01-07 NOTE — Progress Notes (Signed)
[facility] [address] [place]. [place] [address] [phone]       OFFICE FOLLOW UP NOTE  [name] Date of Birth:  [birth year] Medical Record Number:  [id number]   Reason for visit: TIA follow up  CHIEF COMPLAINT:  Chief Complaint  Patient presents with  . Follow-up    TIA follow up room 9 pt alone    HPI: Katrina Rodriguez is being seen today in the office for follow up for TIA on 06/19/17. History obtained from patient and chart review. Reviewed all radiology images and labs personally.  [name] is a 84 y.o. female with history of prior stroke w/ L HP,  AF s/p ablation on warfarin, blind in L eye, shadows in R eye, CHB, and orthostatic hypotension who presented with decreased sensation and increased hemiparesis on her L side.  CT had reviewed and negative for acute abnormality.  CTA head negative for proximal stenosis or large vessel occlusion, visible developing infarction in the brain or abnormal postcontrast enhancement.  Unable to do MRI/MRA due to pacemaker.  Bilateral carotid Doppler showed bilateral ICA stenosis of 1 to 39%.  2D echo showed an EF of 60 to 65% without cardiac source.  LDL 134 and recommended to start Lipitor 40 mg.  Patient was on warfarin prior to admission for atrial fibrillation with an INR of 2.2.  Recommended to continue this at discharge as patient has not tolerated Xarelto or Eliquis in the past.  Patient discharged home with PT/OT recommendations in stable condition.  07/23/2017 visit: Since hospital discharge, patient has been doing well.  Patient is back to all prior activities and she continues home PT/OT for left hemiparesis.  Compliant with Coumadin and last INR check on 07/18/2017 was 2.2 and this was checked by Dr. Doug Sou office who is her cardiologist.  Recommended to start Lipitor at discharge but patient stated Dr. [name] did not want her to but we will ensure this at next week's appointment.  Patient is not currently on  statin therapy.  Blood pressure today mildly elevated at 154/65 but this she is checked at home to therapies and SBP typically 120-130 per patient.  Denies new or worsening stroke/TIA symptoms.  Interval history 01/07/2018: Patient is being seen today for follow up. Overall she has been doing well from a stroke standpoint but does have continued left hemiparesis. She does have continued complaints of worsening vision due to optic nerve (chronic issue). She does continue to live independently without complications. She does have assistance for transportation, house cleaning, cooking and shopping. All she eats is meat, french fries and sweets. She has no intention on changing diet stating that at her age, she should be able to eat what she wants. Continues to take coumadin with recent INR 2.7. Will repeat at the end of this month. She was previously on Crestor but has been recently stopped as her cholesterol levels normalized per patient. Recent lipid panel 10/31/17 with LDL 61. Blood pressure today satisfactory at 143/67. No further concerns at this time. Denies new or worsening stroke/TIA symptoms.    ROS:   14 system review of systems performed and negative with exception of loss of vision  PMH:  Past Medical History:  Diagnosis Date  . Arthritis   . Atrial fibrillation (HCC)    History of ablation - Dr. Rayann Heman  . Chest pain    Has normal coronaries per cath in 2005; negative nuclear in 2010  . Chronic  anemia   . Complete heart block ([place]) 06/09/2013  . H/O: CVA (cardiovascular accident)    Right internal capsule  . Hypothyroidism 06/19/2017  . Left-sided weakness 06/19/2017  . Mild pulmonary hypertension ([place])   . 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 Orthostasis   . Retinal detachment   . Thrombocytopenia ([place])   . TIA (transient ischemic attack)     PSH:  Past Surgical History:  Procedure Laterality Date  . CARDIAC CATHETERIZATION    . CARDIAC PACEMAKER PLACEMENT    . PERMANENT  PACEMAKER GENERATOR CHANGE N/A 06/12/2012   Procedure: PERMANENT PACEMAKER GENERATOR CHANGE;  Surgeon: Evans Lance, MD;  Location: [facility] CATH LAB;  Service: Cardiovascular;  Laterality: N/A;  . RETINAL DETACHMENT SURGERY      Social History:  Social History   Socioeconomic History  . Marital status: Widowed    Spouse name: Not on file  . Number of children: 2  . Years of education: 10  . Highest education level: Not on file  Occupational History  . Occupation: Scientist, research (physical sciences): RETIRED  Social Needs  . Financial resource strain: Not on file  . Food insecurity:    Worry: Not on file    Inability: Not on file  . Transportation needs:    Medical: Not on file    Non-medical: Not on file  Tobacco Use  . Smoking status: Never Smoker  . Smokeless tobacco: Never Used  Substance and Sexual Activity  . Alcohol use: No  . Drug use: No  . Sexual activity: Never  Lifestyle  . Physical activity:    Days per week: Not on file    Minutes per session: Not on file  . Stress: Not on file  Relationships  . Social connections:    Talks on phone: Not on file    Gets together: Not on file    Attends religious service: Not on file    Active member of club or organization: Not on file    Attends meetings of clubs or organizations: Not on file    Relationship status: Not on file  . Intimate partner violence:    Fear of current or ex partner: Not on file    Emotionally abused: Not on file    Physically abused: Not on file    Forced sexual activity: Not on file  Other Topics Concern  . Not on file  Social History Narrative   Lives at home alone.   Caffeine use: Drinks coffee daily   Right handed     Family History:  Family History  Problem Relation Age of Onset  . Heart disease Father   . Stroke Father   . Heart attack Father   . Heart disease Brother   . Heart disease Brother   . Hypertension Mother   . Diabetes Sister   . Heart attack Sister     Medications:   Current  Outpatient Medications on File Prior to Visit  Medication Sig Dispense Refill  . dofetilide (TIKOSYN) 250 MCG capsule TAKE 1 CAPSULE (250 MCG TOTAL) BY MOUTH 2 (TWO) TIMES DAILY. 180 capsule 3  . fludrocortisone (FLORINEF) 0.1 MG tablet TAKE 1 TABLET (0.1 MG TOTAL) BY MOUTH 2 (TWO) TIMES DAILY. 180 tablet 3  . potassium chloride SA (K-DUR,KLOR-CON) 20 MEQ tablet Take 2 tablets (40 mEq total) by mouth daily. 180 tablet 1  . rosuvastatin (CRESTOR) 10 MG tablet Take 1 tablet (10 mg total) by mouth daily. 90 tablet 3  .  warfarin (COUMADIN) 4 MG tablet Take 1/2 to 1 tablet daily as directed by coumadin clinic 90 tablet 1  . Dofetilide (TIKOSYN PO) Tikosyn    . fludrocortisone (FLORINEF) 0.1 MG tablet fludrocortisone 0.1 mg tablet     No current facility-administered medications on file prior to visit.     Allergies:   Allergies  Allergen Reactions  . Amiodarone Other (See Comments)    Caused thyroid problems  . Amoxicillin Hives  . Aspirin Other (See Comments)    On tikosyn and warfarin  . Codeine Other (See Comments)    Causes BP to drop  . Darvocet [Propoxyphene N-Acetaminophen] Hives  . Demerol Itching  . Eliquis [Apixaban] Nausea Only  . Penicillins Hives  . Xarelto [Rivaroxaban] Rash     Physical Exam  Vitals:   01/07/18 1057  BP: (!) 143/67  Pulse: 64  Weight: 113 lb (51.3 kg)  Height: 5\' 5"  (1.651 m)   Body mass index is 18.8 kg/m. No exam data present  General: Frail pleasant elderly Caucasian female, seated, in no evident distress Head: head normocephalic and atraumatic.   Neck: supple with no carotid or supraclavicular bruits Cardiovascular: regular rate and rhythm, no murmurs Musculoskeletal: no deformity Skin:  no rash/petichiae Vascular:  Normal pulses all extremities  Neurologic Exam Mental Status: Awake and fully alert. Oriented to place and time. Recent and remote memory intact. Attention span, concentration and fund of knowledge appropriate. Mood and  affect appropriate.  Cranial Nerves: Left eye blind at baseline but able to see shadows in the right eye.  Hard of hearing.  Facial sensation intact. Face, tongue, palate moves normally and symmetrically.  Motor: Right side 5/5 strength, LUE 4/5; LLE 4/5 Sensory.:  Mildly decreased vibratory sensation on left side compared to right side Coordination: Rapid alternating movements normal in all extremities. Finger-to-nose and heel-to-shin performed accurately bilaterally. Orbits right arm over left. Decreased left hand dexterity.  Gait and Station: Arises from chair without difficulty. Stance is normal. Gait demonstrates slow but stable steps with cane due to blindness Reflexes: 1+ and symmetric. Toes downgoing.      Diagnostic Data (Labs, Imaging, Testing)  CT HEAD WO CONTRAST 06/19/2017 IMPRESSION: Normal aging brain.  CT ANGIO HEAD W OR WO CONTRAST 06/21/2017 IMPRESSION: No proximal stenosis or large vessel occlusion. No visible developing infarction in the brain. No abnormal postcontrast enhancement.  ECHOCARDIOGRAM 06/20/2017 Study Conclusions - Left ventricle: The cavity size was normal. Wall thickness was   normal. Systolic function was normal. The estimated ejection   fraction was in the range of 60% to 65%. Wall motion was normal;   there were no regional wall motion abnormalities. There was no   evidence of elevated ventricular filling pressure by Doppler   parameters. - Aortic valve: Trileaflet; moderately thickened, moderately   calcified leaflets. Valve mobility was restricted. There was   moderate regurgitation. - Mitral valve: There was moderate regurgitation. - Left atrium: The atrium was mildly dilated. - Tricuspid valve: There was moderate regurgitation. - Pulmonary arteries: Systolic pressure was mildly increased. PA   peak pressure: 36 mm Hg (S). Impressions: - No cardiac source of emboli was indentified.  VAS US CAROTID DUPLEX BILATERAL 06/20/2017 Final  Interpretation: Right Carotid: Velocities in the right ICA are consistent with a 1-39% stenosis. Left Carotid: Velocities in the left ICA are consistent with a 1-39% stenosis.    ASSESSMENT: BOBBYJO MARULANDA is a 84 y.o. year old female here with TIA on 06/19/2017 secondary to atrial fibrillation.  Vascular risk factors include atrial fibrillation, and HLD. Patient returns today for follow up visit and has been stable from stroke standpoint with residual left hemiparesis.   PLAN: -Continue warfarin daily  for secondary stroke prevention -f/u with cardiologist regarding INR and coumadin management -F/u with PCP regarding your HLD and blood pressure management -continue to stay active as tolerated and maintain a healthy diet -continue to monitor BP at home -Maintain strict control of hypertension with blood pressure goal below 130/90, diabetes with hemoglobin A1c goal below 6.5% and cholesterol with LDL cholesterol (bad cholesterol) goal below 70 mg/dL. I also advised the patient to eat a healthy diet with plenty of whole grains, cereals, fruits and vegetables, exercise regularly and maintain ideal body weight.  Follow up as needed if patient is stable from stroke standpoint or call earlier if needed   Greater than 50% of time during this 25 minute visit was spent on counseling,explanation of diagnosis of TIA, reviewing risk factor management of HTN, HLD and a fib, planning of further management, discussion with patient and family and coordination of care   Venancio Poisson, [facility]  [facility] [address] [place], [place] [address]  Phone [phone] Fax [phone]

## 2018-01-07 NOTE — Patient Instructions (Signed)
Continue warfarin daily for secondary stroke prevention  Continue to follow up with PCP regarding cholesterol and blood pressure management   Continue to follow with cardiologist regarding atrial fibrillation and coumadin management  Continue to stay active as tolerated and maintain a healthy diet  Continue to monitor blood pressure at home  Maintain strict control of hypertension with blood pressure goal below 130/90, diabetes with hemoglobin A1c goal below 6.5% and cholesterol with LDL cholesterol (bad cholesterol) goal below 70 mg/dL. I also advised the patient to eat a healthy diet with plenty of whole grains, cereals, fruits and vegetables, exercise regularly and maintain ideal body weight.  Followup in the future with me as needed or call earlier if needed       Thank you for coming to see [place] at [facility]. I hope we have been able to provide you high quality care today.  You may receive a patient satisfaction survey over the next few weeks. We would appreciate your feedback and comments so that we may continue to improve ourselves and the health of our patients.

## 2018-01-08 NOTE — Progress Notes (Signed)
I agree with the above plan 

## 2018-01-24 ENCOUNTER — Ambulatory Visit (INDEPENDENT_AMBULATORY_CARE_PROVIDER_SITE_OTHER): Payer: Medicare Other | Admitting: Pharmacist Clinician (PhC)/ Clinical Pharmacy Specialist

## 2018-01-24 DIAGNOSIS — I4891 Unspecified atrial fibrillation: Secondary | ICD-10-CM

## 2018-01-24 DIAGNOSIS — Z5181 Encounter for therapeutic drug level monitoring: Secondary | ICD-10-CM | POA: Diagnosis not present

## 2018-01-24 LAB — POCT INR: INR: 2.9 (ref 2.0–3.0)

## 2018-01-29 ENCOUNTER — Ambulatory Visit (INDEPENDENT_AMBULATORY_CARE_PROVIDER_SITE_OTHER): Payer: Medicare Other

## 2018-01-29 DIAGNOSIS — I4891 Unspecified atrial fibrillation: Secondary | ICD-10-CM

## 2018-01-29 DIAGNOSIS — I495 Sick sinus syndrome: Secondary | ICD-10-CM

## 2018-01-29 NOTE — Progress Notes (Signed)
Remote pacemaker transmission.   

## 2018-02-01 NOTE — Progress Notes (Signed)
hs   Katrina Rodriguez Date of Birth: 1928-10-03 Medical Record #[id number]  History of Present Illness: Song is seen today for follow up of syncope. She has a history of atrial fibrillation. She is on chronic Tikosyn therapy. She has had a previous atrial fibrillation ablation. She has a history of recurrent syncope. She was noted to have intermittent complete heart block. Her pacemaker was reprogrammed to DDD with AV search to minimize V pacing.  She has a history of chronic chest pain with normal coronary anatomy by cardiac catheterization. She had a normal nuclear stress test 2/13. She has known sensitivity with V pacing with chest pain.    She was admitted April 16-20, 2019 with left sided weakness and diagnosed with TIA. CT head showed normal aging brain. CTA head showed no proximal stenosis or large vessel occlusion. MRI could not be obtained as patient has pacemaker. Echocardiogram showed an EF of 60-65%, no cardiac source of emboli was identified.  Wall motion was normal, there were no regional wall motion abnormalities. Carotid ultrasound: 1-39% ICA stenosis bilaterally. LDL 134, hemoglobin A1c was not done. Neurology consulted and appreciated, likely presented with TIA with embolic etiology from atrial fibrillation despite optimal anticoagulation.  She has been on Eliquis and Xarelto in the past and was not tolerated, would like to remain on Coumadin. She also had complaints of chest pressure but troponins were normal.  Pacemaker check 10/30/17 showed normal pacemaker function. No significant Afib.   On follow up today she still has weakness on her left side. She notes her BP has been very labile. She is getting readings sometimes down to 60 systolic and she passes out. Feels sick to her stomach before. [facility] nurse has noted marked orthostasis at times. At other times BP is elevated.  Still lives by herself. Has refused SNF. Still cooks for self. Vision is gone.  No chest pain or dyspnea. Few  palpitations.  Current Outpatient Medications on File Prior to Visit  Medication Sig Dispense Refill  . Dofetilide (TIKOSYN PO) Tikosyn    . fludrocortisone (FLORINEF) 0.1 MG tablet TAKE 1 TABLET (0.1 MG TOTAL) BY MOUTH 2 (TWO) TIMES DAILY. 180 tablet 3  . fludrocortisone (FLORINEF) 0.1 MG tablet fludrocortisone 0.1 mg tablet    . potassium chloride SA (K-DUR,KLOR-CON) 20 MEQ tablet Take 2 tablets (40 mEq total) by mouth daily. 180 tablet 1  . warfarin (COUMADIN) 4 MG tablet Take 1/2 to 1 tablet daily as directed by coumadin clinic 90 tablet 1   No current facility-administered medications on file prior to visit.     Allergies  Allergen Reactions  . Amiodarone Other (See Comments)    Caused thyroid problems  . Amoxicillin Hives  . Aspirin Other (See Comments)    On tikosyn and warfarin  . Codeine Other (See Comments)    Causes BP to drop  . Darvocet [Propoxyphene N-Acetaminophen] Hives  . Demerol Itching  . Eliquis [Apixaban] Nausea Only  . Penicillins Hives  . Xarelto [Rivaroxaban] Rash    Past Medical History:  Diagnosis Date  . Arthritis   . Atrial fibrillation (HCC)    History of ablation - Dr. Rayann Heman  . Chest pain    Has normal coronaries per cath in 2005; negative nuclear in 2010  . Chronic anemia   . Complete heart block ([place]) 06/09/2013  . H/O: CVA (cardiovascular accident)    Right internal capsule  . Hypothyroidism 06/19/2017  . Left-sided weakness 06/19/2017  . Mild pulmonary hypertension ([place])   .  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 Orthostasis   . Retinal detachment   . Thrombocytopenia ([place])   . TIA (transient ischemic attack)     Past Surgical History:  Procedure Laterality Date  . CARDIAC CATHETERIZATION    . CARDIAC PACEMAKER PLACEMENT    . PERMANENT PACEMAKER GENERATOR CHANGE N/A 06/12/2012   Procedure: PERMANENT PACEMAKER GENERATOR CHANGE;  Surgeon: Evans Lance, MD;  Location: [facility] CATH LAB;  Service: Cardiovascular;  Laterality: N/A;  .  RETINAL DETACHMENT SURGERY      Social History   Tobacco Use  Smoking Status Never Smoker  Smokeless Tobacco Never Used    Social History   Substance and Sexual Activity  Alcohol Use No    Family History  Problem Relation Age of Onset  . Heart disease Father   . Stroke Father   . Heart attack Father   . Heart disease Brother   . Heart disease Brother   . Hypertension Mother   . Diabetes Sister   . Heart attack Sister     Review of Systems: The review of systems is positive for  vision loss.  All other systems were reviewed and are negative.  Physical Exam: BP (!) 174/81   Pulse 80   Ht 5\' 4"  (1.626 m)   Wt 111 lb 6.4 oz (50.5 kg)   BMI 19.12 kg/m  GENERAL:  Well appearing, elderly WF in NAD. Walks with a cane. HEENT:  PERRL, EOMI, sclera are clear. Oropharynx is clear. NECK:  No jugular venous distention, carotid upstroke brisk and symmetric, no bruits, no thyromegaly or adenopathy LUNGS:  Clear to auscultation bilaterally CHEST:  Unremarkable HEART:  RRR,  PMI not displaced or sustained,S1 and S2 within normal limits, no S3, no S4: no clicks, no rubs, no murmurs ABD:  Soft, nontender. BS +, no masses or bruits. No hepatomegaly, no splenomegaly EXT:  2 + pulses throughout, no edema, no cyanosis no clubbing SKIN:  Warm and dry.  No rashes NEURO:  Alert and oriented x 3. Cranial nerves II through XII intact. Walks with a cane. Poor vision. 4/5 strength left side.  PSYCH:  Cognitively intact     LABORATORY DATA:  Lab Results  Component Value Date   WBC 4.9 06/20/2017   HGB 13.5 06/20/2017   HCT 41.5 06/20/2017   PLT 114 (L) 06/20/2017   GLUCOSE 82 08/20/2017   CHOL 139 10/31/2017   TRIG 90 10/31/2017   HDL 60 10/31/2017   LDLCALC 61 10/31/2017   ALT 9 10/31/2017   AST 17 10/31/2017   NA 145 (H) 08/20/2017   K 4.4 08/20/2017   CL 105 08/20/2017   CREATININE 1.18 (H) 08/20/2017   BUN 24 08/20/2017   CO2 23 08/20/2017   TSH 1.04 09/16/2012   INR 2.9  01/24/2018   HGBA1C 5.8 (H) 06/12/2012   Dated 07/09/17: Normal CBC, CMET, TSH  INR 2.0 today.  Echo 06/20/17: Study Conclusions  - Left ventricle: The cavity size was normal. Wall thickness was   normal. Systolic function was normal. The estimated ejection   fraction was in the range of 60% to 65%. Wall motion was normal;   there were no regional wall motion abnormalities. There was no   evidence of elevated ventricular filling pressure by Doppler   parameters. - Aortic valve: Trileaflet; moderately thickened, moderately   calcified leaflets. Valve mobility was restricted. There was   moderate regurgitation. - Mitral valve: There was moderate regurgitation. - Left atrium: The  atrium was mildly dilated. - Tricuspid valve: There was moderate regurgitation. - Pulmonary arteries: Systolic pressure was mildly increased. PA   peak pressure: 36 mm Hg (S).  Impressions:  - No cardiac source of emboli was indentified  Assessment / Plan: 1. Atrial fibrillation. She appears to be maintaining sinus rhythm well on Tikosyn. Status post ablation.  Currently on Coumadin. INR therapeutic  today.She was unable to afford NOACs. She needs to stay on Tikosyn long term.   2. Chronic chest pain. Ischemic evaluation has been negative. Some chest pain related to V pacing. Currently doing well.  3. History of CVA in past. More recent TIA in April. Still has persistent left sided weakness. Reports she has a standing DNR order.   On chronic coumadin.   4. Sick sinus syndrome and CHB status post pacemaker implant.  Patient has regular followup in pacemaker clinic.  5. Recurrent syncope, multifactorial. She is on chronic Florinef. Continue to liberalize salt and wear support hose. She is having more orthostatic symptoms. Recommend adding midodrine 2.5 mg  Tid to see if this helps with her symptoms. Follow up in 2 months.  6. Hypercholesterolemia. We had added Crestor 10 mg daily on her last visit with  marked improvement in her lipids and LFTs were normal. For some reason she is no longer taking. We will renew.

## 2018-02-06 ENCOUNTER — Ambulatory Visit (INDEPENDENT_AMBULATORY_CARE_PROVIDER_SITE_OTHER): Payer: Medicare Other | Admitting: Cardiology

## 2018-02-06 ENCOUNTER — Encounter: Payer: Self-pay | Admitting: Cardiology

## 2018-02-06 ENCOUNTER — Ambulatory Visit (INDEPENDENT_AMBULATORY_CARE_PROVIDER_SITE_OTHER): Payer: Medicare Other | Admitting: Pharmacist

## 2018-02-06 VITALS — BP 174/81 | HR 80 | Ht 64.0 in | Wt 111.4 lb

## 2018-02-06 DIAGNOSIS — I63411 Cerebral infarction due to embolism of right middle cerebral artery: Secondary | ICD-10-CM | POA: Diagnosis not present

## 2018-02-06 DIAGNOSIS — I480 Paroxysmal atrial fibrillation: Secondary | ICD-10-CM

## 2018-02-06 DIAGNOSIS — Z8673 Personal history of transient ischemic attack (TIA), and cerebral infarction without residual deficits: Secondary | ICD-10-CM | POA: Diagnosis not present

## 2018-02-06 DIAGNOSIS — I4891 Unspecified atrial fibrillation: Secondary | ICD-10-CM | POA: Diagnosis not present

## 2018-02-06 DIAGNOSIS — I951 Orthostatic hypotension: Secondary | ICD-10-CM | POA: Diagnosis not present

## 2018-02-06 DIAGNOSIS — I442 Atrioventricular block, complete: Secondary | ICD-10-CM

## 2018-02-06 DIAGNOSIS — Z5181 Encounter for therapeutic drug level monitoring: Secondary | ICD-10-CM

## 2018-02-06 DIAGNOSIS — I495 Sick sinus syndrome: Secondary | ICD-10-CM | POA: Diagnosis not present

## 2018-02-06 LAB — POCT INR: INR: 2.4 (ref 2.0–3.0)

## 2018-02-06 MED ORDER — POTASSIUM CHLORIDE CRYS ER 20 MEQ PO TBCR: 40.0000 meq | EXTENDED_RELEASE_TABLET | Freq: Every day | ORAL | 3 refills | Status: AC

## 2018-02-06 MED ORDER — ROSUVASTATIN CALCIUM 10 MG PO TABS: 10.0000 mg | ORAL_TABLET | Freq: Every day | ORAL | 3 refills | Status: DC

## 2018-02-06 MED ORDER — MIDODRINE HCL 2.5 MG PO TABS: 2.5000 mg | ORAL_TABLET | Freq: Three times a day (TID) | ORAL | 11 refills | Status: DC

## 2018-02-06 NOTE — Patient Instructions (Signed)
We will add midodrine 2.5 mg three times a day to help  With your low BP readings. Follow up in 2 months.

## 2018-03-07 ENCOUNTER — Encounter: Payer: Self-pay | Admitting: Nurse Practitioner

## 2018-03-07 ENCOUNTER — Ambulatory Visit (INDEPENDENT_AMBULATORY_CARE_PROVIDER_SITE_OTHER): Payer: Medicare Other | Admitting: Nurse Practitioner

## 2018-03-07 VITALS — BP 150/64 | HR 60 | Ht 64.0 in | Wt 113.8 lb

## 2018-03-07 DIAGNOSIS — R001 Bradycardia, unspecified: Secondary | ICD-10-CM

## 2018-03-07 DIAGNOSIS — I4819 Other persistent atrial fibrillation: Secondary | ICD-10-CM

## 2018-03-07 DIAGNOSIS — I951 Orthostatic hypotension: Secondary | ICD-10-CM | POA: Diagnosis not present

## 2018-03-07 LAB — CUP PACEART INCLINIC DEVICE CHECK
Date Time Interrogation Session: 20200102100745
Implantable Lead Location: 753859
Implantable Lead Location: 753860
Implantable Lead Model: 4458
MDC IDC LEAD IMPLANT DT: 20040309
MDC IDC LEAD IMPLANT DT: 20040309
MDC IDC LEAD SERIAL: 304362
MDC IDC PG IMPLANT DT: 20140409

## 2018-03-07 LAB — BASIC METABOLIC PANEL
BUN/Creatinine Ratio: 17 (ref 12–28)
BUN: 21 mg/dL (ref 8–27)
CO2: 24 mmol/L (ref 20–29)
CREATININE: 1.26 mg/dL — AB (ref 0.57–1.00)
Calcium: 8.9 mg/dL (ref 8.7–10.3)
Chloride: 103 mmol/L (ref 96–106)
GFR calc Af Amer: 44 mL/min/{1.73_m2} — ABNORMAL LOW (ref >59)
GFR calc non Af Amer: 38 mL/min/{1.73_m2} — ABNORMAL LOW (ref >59)
Glucose: 80 mg/dL (ref 65–99)
Potassium: 4.1 mmol/L (ref 3.5–5.2)
Sodium: 142 mmol/L (ref 134–144)

## 2018-03-07 LAB — MAGNESIUM: Magnesium: 2.5 mg/dL — ABNORMAL HIGH (ref 1.6–2.3)

## 2018-03-07 MED ORDER — MIDODRINE HCL 2.5 MG PO TABS: 2.5000 mg | ORAL_TABLET | Freq: Two times a day (BID) | ORAL | 3 refills | Status: DC

## 2018-03-07 NOTE — Patient Instructions (Addendum)
Medication Instructions:  Midodrine 2.5 mg twice per day Breakfast and Lunch  If you need a refill on your cardiac medications before your next appointment, please call your pharmacy.   Lab work:TODAY MAGNESIUM BMET  If you have labs (blood work) drawn today and your tests are completely normal, you will receive your results only by: Marland Kitchen MyChart Message (if you have MyChart) OR . A paper copy in the mail If you have any lab test that is abnormal or we need to change your treatment, we will call you to review the results.  Testing/Procedures:   Follow-Up: Amber 6 months  At [facility], you and your health needs are our priority.  As part of our continuing mission to provide you with exceptional heart care, we have created designated Provider Care Teams.  These Care Teams include your primary Cardiologist (physician) and Advanced Practice Providers (APPs -  Physician Assistants and Nurse Practitioners) who all work together to provide you with the care you need, when you need it. .   Any Other Special Instructions Will Be Listed Below (If Applicable). [phone] Remote monitoring is used to monitor your Pacemaker  from home. This monitoring reduces the number of office visits required to check your device to one time per year. It allows [place] to keep an eye on the functioning of your device to ensure it is working properly. You are scheduled for a device check from home on 04/30/18. You may send your transmission at any time that day. If you have a wireless device, the transmission will be sent automatically. After your physician reviews your transmission, you will receive a postcard with your next transmission date.

## 2018-03-07 NOTE — Progress Notes (Signed)
Electrophysiology Office Note Date: 03/07/2018  ID:  Katrina, Rodriguez December 31, 1928, MRN [id number]  PCP: Leighton Ruff, MD Primary Cardiologist: [name]: Allred  CC: Pacemaker follow-up  Katrina Rodriguez is a 85 y.o. female seen today for Dr Rayann Heman.  She presents today for routine electrophysiology followup.  Since last being seen in our clinic, the patient reports doing reasonably well.  She still lives at home and does her own cooking.  She has had Proamatine added by Dr [name]. She has been taking 3 times daily and has had fewer syncopal spells but has had worsening nausea that she feels is worsened by the proamatine. She denies chest pain, palpitations, dyspnea, PND, orthopnea, vomiting, dizziness, edema, weight gain, or early satiety.  Device History: MDT dual chamber PPM implanted 2004 for symptomatic bradycardia, gen change 2014  Past Medical History:  Diagnosis Date  . Arthritis   . Atrial fibrillation (HCC)    History of ablation - Dr. Rayann Heman  . Chest pain    Has normal coronaries per cath in 2005; negative nuclear in 2010  . Chronic anemia   . Complete heart block ([place]) 06/09/2013  . H/O: CVA (cardiovascular accident)    Right internal capsule  . Hypothyroidism 06/19/2017  . Left-sided weakness 06/19/2017  . Mild pulmonary hypertension ([place])   . 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 Orthostasis   . Retinal detachment   . Thrombocytopenia ([place])   . TIA (transient ischemic attack)    Past Surgical History:  Procedure Laterality Date  . CARDIAC CATHETERIZATION    . CARDIAC PACEMAKER PLACEMENT    . PERMANENT PACEMAKER GENERATOR CHANGE N/A 06/12/2012   Procedure: PERMANENT PACEMAKER GENERATOR CHANGE;  Surgeon: Evans Lance, MD;  Location: [facility] CATH LAB;  Service: Cardiovascular;  Laterality: N/A;  . RETINAL DETACHMENT SURGERY      Current Outpatient Medications  Medication Sig Dispense Refill  . Dofetilide (TIKOSYN PO) Tikosyn    . fludrocortisone  (FLORINEF) 0.1 MG tablet TAKE 1 TABLET (0.1 MG TOTAL) BY MOUTH 2 (TWO) TIMES DAILY. 180 tablet 3  . fludrocortisone (FLORINEF) 0.1 MG tablet fludrocortisone 0.1 mg tablet    . potassium chloride SA (K-DUR,KLOR-CON) 20 MEQ tablet Take 2 tablets (40 mEq total) by mouth daily. 180 tablet 3  . rosuvastatin (CRESTOR) 10 MG tablet Take 1 tablet (10 mg total) by mouth daily. 90 tablet 3  . warfarin (COUMADIN) 4 MG tablet Take 1/2 to 1 tablet daily as directed by coumadin clinic 90 tablet 1  . midodrine (PROAMATINE) 2.5 MG tablet Take 1 tablet (2.5 mg total) by mouth 2 (two) times daily with a meal. 180 tablet 3   No current facility-administered medications for this visit.     Allergies:   Amiodarone; Amoxicillin; Aspirin; Codeine; Darvocet [propoxyphene n-acetaminophen]; Demerol; Eliquis [apixaban]; Penicillins; and Xarelto [rivaroxaban]   Social History: Social History   Socioeconomic History  . Marital status: Widowed    Spouse name: Not on file  . Number of children: 2  . Years of education: 10  . Highest education level: Not on file  Occupational History  . Occupation: Scientist, research (physical sciences): RETIRED  Social Needs  . Financial resource strain: Not on file  . Food insecurity:    Worry: Not on file    Inability: Not on file  . Transportation needs:    Medical: Not on file    Non-medical: Not on file  Tobacco Use  . Smoking status: Never Smoker  .  Smokeless tobacco: Never Used  Substance and Sexual Activity  . Alcohol use: No  . Drug use: No  . Sexual activity: Never  Lifestyle  . Physical activity:    Days per week: Not on file    Minutes per session: Not on file  . Stress: Not on file  Relationships  . Social connections:    Talks on phone: Not on file    Gets together: Not on file    Attends religious service: Not on file    Active member of club or organization: Not on file    Attends meetings of clubs or organizations: Not on file    Relationship status: Not on file    . Intimate partner violence:    Fear of current or ex partner: Not on file    Emotionally abused: Not on file    Physically abused: Not on file    Forced sexual activity: Not on file  Other Topics Concern  . Not on file  Social History Narrative   Lives at home alone.   Caffeine use: Drinks coffee daily   Right handed     Family History: Family History  Problem Relation Age of Onset  . Heart disease Father   . Stroke Father   . Heart attack Father   . Heart disease Brother   . Heart disease Brother   . Hypertension Mother   . Diabetes Sister   . Heart attack Sister      Review of Systems: All other systems reviewed and are otherwise negative except as noted above.   Physical Exam: VS:  BP (!) 150/64   Pulse 60   Ht 5\' 4"  (1.626 m)   Wt 113 lb 12.8 oz (51.6 kg)   SpO2 96%   BMI 19.53 kg/m  , BMI Body mass index is 19.53 kg/m.  GEN- The patient is elderly and frail appearing, alert and oriented x 3 today.   HEENT: normocephalic, atraumatic; sclera clear, conjunctiva pink; hearing intact; oropharynx clear; neck supple  Lungs- Clear to ausculation bilaterally, normal work of breathing.  No wheezes, rales, rhonchi Heart- Regular rate and rhythm  GI- soft, non-tender, non-distended, bowel sounds present  Extremities- no clubbing, cyanosis, or edema  MS- no significant deformity or atrophy Skin- warm and dry, no rash or lesion; PPM pocket well healed Psych- euthymic mood, full affect Neuro- strength and sensation are intact  PPM Interrogation- reviewed in detail today,  See PACEART report  EKG:  EKG is ordered today. The ekg ordered today shows atrial pacing, rate 60, QTc 416msec  Recent Labs: 06/20/2017: Hemoglobin 13.5; Platelets 114 08/20/2017: BUN 24; Creatinine, Ser 1.18; Magnesium 2.4; Potassium 4.4; Sodium 145 10/31/2017: ALT 9   Wt Readings from Last 3 Encounters:  03/07/18 113 lb 12.8 oz (51.6 kg)  02/06/18 111 lb 6.4 oz (50.5 kg)  01/07/18 113 lb  (51.3 kg)     Other studies Reviewed: Additional studies/ records that were reviewed today include: Dr Doug Sou office notes   Assessment and Plan:  1.  Symptomatic bradycardia Normal PPM function See Pace Art report No changes today  2.  Persistent atrial fibrillation Doing well on Tikosyn QTc stable today BMET, Mg today Continue Warfarin for CHADS2VASC of 7  3.  Orthostatic hypotension Continue Florinef Midodrine added at last office visit with Dr [name] - will decrease to bid dosing and see if that helps with nausea.  Current medicines are reviewed at length with the patient today.   The patient  does not have concerns regarding her medicines.  The following changes were made today:  Decrease proamatine to bid  Labs/ tests ordered today include: BMET, Mg Orders Placed This Encounter  Procedures  . Magnesium  . Basic metabolic panel  . CUP PACEART [place]  . EKG 12-Lead     Disposition:   Follow up with [facility], Dr [name] as scheduled, me in 6 months    Signed, Chanetta Marshall, NP 03/07/2018 11:55 AM  [place] [address] [phone] (office) [phone] (fax)

## 2018-03-08 ENCOUNTER — Telehealth: Payer: Self-pay

## 2018-03-08 NOTE — Telephone Encounter (Signed)
-----   Message from Katrina Berthold, NP sent at 03/08/2018 12:33 PM EST ----- Please notify patient of stable labs.  Thanks! Museum/gallery conservator

## 2018-03-08 NOTE — Telephone Encounter (Signed)
Notes recorded by Michae Kava, CMA on 03/08/2018 at 1:26 PM EST Pt has been notified of lab results by phone with verbal understanding. Pt thanked me for the call.

## 2018-03-20 ENCOUNTER — Ambulatory Visit (INDEPENDENT_AMBULATORY_CARE_PROVIDER_SITE_OTHER): Payer: Medicare Other | Admitting: Pharmacist

## 2018-03-20 DIAGNOSIS — Z5181 Encounter for therapeutic drug level monitoring: Secondary | ICD-10-CM | POA: Diagnosis not present

## 2018-03-20 DIAGNOSIS — I4891 Unspecified atrial fibrillation: Secondary | ICD-10-CM | POA: Diagnosis not present

## 2018-03-20 LAB — POCT INR: INR: 2.4 (ref 2.0–3.0)

## 2018-03-22 LAB — CUP PACEART REMOTE DEVICE CHECK
Battery Impedance: 822 Ohm
Battery Voltage: 2.78 V
Brady Statistic AP VP Percent: 0 %
Brady Statistic AP VS Percent: 69 %
Brady Statistic AS VP Percent: 0 %
Brady Statistic AS VS Percent: 31 %
Date Time Interrogation Session: 20191126134102
Implantable Lead Implant Date: 20040309
Implantable Lead Implant Date: 20040309
Implantable Lead Location: 753859
Implantable Lead Model: 4458
Implantable Lead Serial Number: 304362
Implantable Pulse Generator Implant Date: 20140409
Lead Channel Impedance Value: 1041 Ohm
Lead Channel Pacing Threshold Amplitude: 0.5 V
Lead Channel Pacing Threshold Amplitude: 0.625 V
Lead Channel Pacing Threshold Pulse Width: 0.4 ms
Lead Channel Pacing Threshold Pulse Width: 0.4 ms
Lead Channel Setting Pacing Amplitude: 2 V
Lead Channel Setting Pacing Amplitude: 2.5 V
Lead Channel Setting Pacing Pulse Width: 0.4 ms
Lead Channel Setting Sensing Sensitivity: 4 mV
MDC IDC LEAD LOCATION: 753860
MDC IDC MSMT BATTERY REMAINING LONGEVITY: 72 mo
MDC IDC MSMT LEADCHNL RV IMPEDANCE VALUE: 663 Ohm

## 2018-04-05 ENCOUNTER — Encounter: Payer: Self-pay | Admitting: Physician Assistant

## 2018-04-05 ENCOUNTER — Ambulatory Visit (INDEPENDENT_AMBULATORY_CARE_PROVIDER_SITE_OTHER): Payer: Medicare Other | Admitting: Physician Assistant

## 2018-04-05 VITALS — BP 141/65 | HR 60 | Ht 64.0 in | Wt 113.4 lb

## 2018-04-05 DIAGNOSIS — R55 Syncope and collapse: Secondary | ICD-10-CM

## 2018-04-05 DIAGNOSIS — I4891 Unspecified atrial fibrillation: Secondary | ICD-10-CM

## 2018-04-05 DIAGNOSIS — I495 Sick sinus syndrome: Secondary | ICD-10-CM | POA: Diagnosis not present

## 2018-04-05 DIAGNOSIS — I951 Orthostatic hypotension: Secondary | ICD-10-CM | POA: Diagnosis not present

## 2018-04-05 DIAGNOSIS — E039 Hypothyroidism, unspecified: Secondary | ICD-10-CM | POA: Diagnosis not present

## 2018-04-05 DIAGNOSIS — Z950 Presence of cardiac pacemaker: Secondary | ICD-10-CM | POA: Diagnosis not present

## 2018-04-05 MED ORDER — MIDODRINE HCL 2.5 MG PO TABS: 2.5000 mg | ORAL_TABLET | Freq: Three times a day (TID) | ORAL | 3 refills | Status: DC

## 2018-04-05 NOTE — Patient Instructions (Signed)
Medication Instructions:  Increase Midodrine to 1 tablet three times daily.   --Check your blood pressure 1-2 hours after taking your morning dose. If your systolic blood pressure (top number) is less than 100, it is ok to take an extra tablet in the morning.  If you need a refill on your cardiac medications before your next appointment, please call your pharmacy.   Follow-Up: At [facility], you and your health needs are our priority.  As part of our continuing mission to provide you with exceptional heart care, we have created designated Provider Care Teams.  These Care Teams include your primary Cardiologist (physician) and Advanced Practice Providers (APPs -  Physician Assistants and Nurse Practitioners) who all work together to provide you with the care you need, when you need it. You will need a follow up appointment in 6 months with Dr. [name].  Please call our office 2 months in advance to schedule this appointment.    Advanced Practice Providers on your designated Care Team: [place], [place] . Fabian Sharp, PA-C  Any Other Special Instructions Will Be Listed Below (If Applicable). None.

## 2018-04-05 NOTE — Progress Notes (Signed)
Cardiology Office Note    Date:  04/07/2018   ID:  Katrina Rodriguez, Katrina Rodriguez [birth year], MRN [id number]  PCP:  Leighton Ruff, MD  Cardiologist:  Dr. [name]  EP: Dr. Rayann Heman  Chief Complaint  Patient presents with  . Follow-up    seen for Dr. [name].     History of Present Illness:  Katrina Rodriguez is a 85 y.o. female with PMH of PAF on tikosyn (h/o ablation), recurrent syncope, intermittent CHB s/p PPM, h/o TIA, history of chronic chest pain with normal coronaries, and hypothyroidism.  She had a normal nuclear stress test in February 2013.  She has known sensitivity with V pacing was chest pain.  Pacemaker was reprogrammed to DDD with AV search to minimize V pacing.  She was admitted in April 2019 with left-sided weakness and a diagnosed with TIA.  MRI of the brain was not able to be obtained as she had a pacemaker.  CT of the head showed no large vessel occlusion.  Echocardiogram at the time showed EF 60 to 65%, no cardiac source of emboli.  Wall motion was normal.  Carotid ultrasound only showed 1 to 39% disease bilaterally.  She could not tolerate Xarelto or Eliquis in the past and was remained on Coumadin.  She can by herself and has refused skilled nursing facility.  Her last office visit with Dr. [name] was on 02/06/2018, at which time she was doing well.  Given prior history of this syncope, Dr. [name] recommended increase salt intake and wear support hose.  She is on chronic Florinef for orthostatic hypotension.  Patient was recently seen in the EP clinic on 03/07/2017, no change was made to the device.  Patient presents today for cardiology office visit.  Her midodrine was recently reduced to twice daily dosing as it was suggested she might have nausea because of Midrin.  However after switching to twice daily dosing, she says her nausea is still going on and has not changed in the degree.  I will increase the Midrin back up to 3 times a day.  Sometimes in the morning, given after the milligram, she  noticed her systolic blood pressure in the 70s.  I asked her to recheck her blood pressure 1 to 2 hours after taking the morning Midrin and that if her systolic blood pressure still less than 100, she may take additional dose of metformin to help avoid significant orthostatic hypotension.  Otherwise she appears to be euvolemic on physical exam.  She is maintaining sinus rhythm on the Tikosyn.  QTC level today is 474.   Past Medical History:  Diagnosis Date  . Arthritis   . Atrial fibrillation (HCC)    History of ablation - Dr. Rayann Heman  . Chest pain    Has normal coronaries per cath in 2005; negative nuclear in 2010  . Chronic anemia   . Complete heart block ([place]) 06/09/2013  . H/O: CVA (cardiovascular accident)    Right internal capsule  . Hypothyroidism 06/19/2017  . Left-sided weakness 06/19/2017  . Mild pulmonary hypertension ([place])   . 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 Orthostasis   . Retinal detachment   . Thrombocytopenia ([place])   . TIA (transient ischemic attack)     Past Surgical History:  Procedure Laterality Date  . CARDIAC CATHETERIZATION    . CARDIAC PACEMAKER PLACEMENT    . PERMANENT PACEMAKER GENERATOR CHANGE N/A 06/12/2012   Procedure: PERMANENT PACEMAKER GENERATOR CHANGE;  Surgeon: Evans Lance, MD;  Location: [facility];  Service: Cardiovascular;  Laterality: N/A;  . RETINAL DETACHMENT SURGERY      Current Medications: Outpatient Medications Prior to Visit  Medication Sig Dispense Refill  . Dofetilide (TIKOSYN PO) Tikosyn    . fludrocortisone (FLORINEF) 0.1 MG tablet TAKE 1 TABLET (0.1 MG TOTAL) BY MOUTH 2 (TWO) TIMES DAILY. 180 tablet 3  . fludrocortisone (FLORINEF) 0.1 MG tablet fludrocortisone 0.1 mg tablet    . potassium chloride SA (K-DUR,KLOR-CON) 20 MEQ tablet Take 2 tablets (40 mEq total) by mouth daily. 180 tablet 3  . rosuvastatin (CRESTOR) 10 MG tablet Take 1 tablet (10 mg total) by mouth daily. 90 tablet 3  . warfarin (COUMADIN) 4 MG tablet Take  1/2 to 1 tablet daily as directed by coumadin clinic 90 tablet 1  . midodrine (PROAMATINE) 2.5 MG tablet Take 1 tablet (2.5 mg total) by mouth 2 (two) times daily with a meal. 180 tablet 3   No facility-administered medications prior to visit.      Allergies:   Amiodarone; Amoxicillin; Aspirin; Codeine; Darvocet [propoxyphene n-acetaminophen]; Demerol; Eliquis [apixaban]; Penicillins; and Xarelto [rivaroxaban]   Social History   Socioeconomic History  . Marital status: Widowed    Spouse name: Not on file  . Number of children: 2  . Years of education: 10  . Highest education level: Not on file  Occupational History  . Occupation: Scientist, research (physical sciences): RETIRED  Social Needs  . Financial resource strain: Not on file  . Food insecurity:    Worry: Not on file    Inability: Not on file  . Transportation needs:    Medical: Not on file    Non-medical: Not on file  Tobacco Use  . Smoking status: Never Smoker  . Smokeless tobacco: Never Used  Substance and Sexual Activity  . Alcohol use: No  . Drug use: No  . Sexual activity: Never  Lifestyle  . Physical activity:    Days per week: Not on file    Minutes per session: Not on file  . Stress: Not on file  Relationships  . Social connections:    Talks on phone: Not on file    Gets together: Not on file    Attends religious service: Not on file    Active member of club or organization: Not on file    Attends meetings of clubs or organizations: Not on file    Relationship status: Not on file  Other Topics Concern  . Not on file  Social History Narrative   Lives at home alone.   Caffeine use: Drinks coffee daily   Right handed      Family History:  The patient's family history includes Diabetes in her sister; Heart attack in her father and sister; Heart disease in her brother, brother, and father; Hypertension in her mother; Stroke in her father.   ROS:   Please see the history of present illness.    ROS All other systems  reviewed and are negative.   PHYSICAL EXAM:   VS:  BP (!) 141/65   Pulse 60   Ht 5\' 4"  (1.626 m)   Wt 113 lb 6.4 oz (51.4 kg)   BMI 19.47 kg/m    GEN: Well nourished, well developed, in no acute distress  HEENT: normal  Neck: no JVD, carotid bruits, or masses Cardiac: RRR; no murmurs, rubs, or gallops,no edema  Respiratory:  clear to auscultation bilaterally, normal work of breathing GI: soft, nontender, nondistended, + BS MS:  no deformity or atrophy  Skin: warm and dry, no rash Neuro:  Alert and Oriented x 3, Strength and sensation are intact Psych: euthymic mood, full affect  Wt Readings from Last 3 Encounters:  04/05/18 113 lb 6.4 oz (51.4 kg)  03/07/18 113 lb 12.8 oz (51.6 kg)  02/06/18 111 lb 6.4 oz (50.5 kg)      Studies/Labs Reviewed:   EKG:  EKG is ordered today.  The ekg ordered today demonstrates normal sinus rhythm, poor R wave progression anterior leads.  Recent Labs: 06/20/2017: Hemoglobin 13.5; Platelets 114 10/31/2017: ALT 9 03/07/2018: BUN 21; Creatinine, Ser 1.26; Magnesium 2.5; Potassium 4.1; Sodium 142   Lipid Panel    Component Value Date/Time   CHOL 139 10/31/2017 0819   TRIG 90 10/31/2017 0819   HDL 60 10/31/2017 0819   CHOLHDL 3.8 06/20/2017 0800   VLDL 18 06/20/2017 0800   LDLCALC 61 10/31/2017 0819    Additional studies/ records that were reviewed today include:   Echo 06/20/2017 LV EF: 60% -   65%  Study Conclusions  - Left ventricle: The cavity size was normal. Wall thickness was   normal. Systolic function was normal. The estimated ejection   fraction was in the range of 60% to 65%. Wall motion was normal;   there were no regional wall motion abnormalities. There was no   evidence of elevated ventricular filling pressure by Doppler   parameters. - Aortic valve: Trileaflet; moderately thickened, moderately   calcified leaflets. Valve mobility was restricted. There was   moderate regurgitation. - Mitral valve: There was moderate  regurgitation. - Left atrium: The atrium was mildly dilated. - Tricuspid valve: There was moderate regurgitation. - Pulmonary arteries: Systolic pressure was mildly increased. PA   peak pressure: 36 mm Hg (S).  Impressions:  - No cardiac source of emboli was indentified.   ASSESSMENT:    1. HYPOTENSION, ORTHOSTATIC   2. BRADYCARDIA-TACHYCARDIA SYNDROME   3. Atrial fibrillation, unspecified type ([place])   4. Vasovagal syncope   5. Pacemaker   6. Hypothyroidism, unspecified type      PLAN:  In order of problems listed above:  1. Orthostatic hypotension: Recently her Midrin was reduced to twice a day, this did not improve her nausea.  She continued to have blood pressure drops down to the 80s especially in the morning.  I will increase Midrin back to 3 times a day.  She is aware that if her systolic blood pressure is still less than 100 after she takes the morning dose of Midrin, it is okay with her to take additional tablet in the morning.  She is on the lowest dose of Midrin at this time.  2. Paroxysmal atrial fibrillation: Controlled on Tikosyn.  She is unable to tolerate any blood pressure or rate control medication.  Anticoagulated on Coumadin  3. History of pacemaker: managed by Dr. Rayann Heman    Medication Adjustments/Labs and Tests Ordered: Current medicines are reviewed at length with the patient today.  Concerns regarding medicines are outlined above.  Medication changes, Labs and Tests ordered today are listed in the Patient Instructions below. Patient Instructions  Medication Instructions:  Increase Midodrine to 1 tablet three times daily.   --Check your blood pressure 1-2 hours after taking your morning dose. If your systolic blood pressure (top number) is less than 100, it is ok to take an extra tablet in the morning.  If you need a refill on your cardiac medications before your next appointment, please call your  pharmacy.   Follow-Up: At [facility], you and  your health needs are our priority.  As part of our continuing mission to provide you with exceptional heart care, we have created designated Provider Care Teams.  These Care Teams include your primary Cardiologist (physician) and Advanced Practice Providers (APPs -  Physician Assistants and Nurse Practitioners) who all work together to provide you with the care you need, when you need it. You will need a follow up appointment in 6 months with Dr. [name].  Please call our office 2 months in advance to schedule this appointment.    Advanced Practice Providers on your designated Care Team: [place], [place] . Fabian Sharp, PA-C  Any Other Special Instructions Will Be Listed Below (If Applicable). None.      Hilbert Corrigan, [place]  04/07/2018 11:43 PM    [facility] [address], [place], [place]  [address] Phone: [phone]; Fax: [phone]

## 2018-04-07 ENCOUNTER — Encounter: Payer: Self-pay | Admitting: Physician Assistant

## 2018-04-08 ENCOUNTER — Telehealth: Payer: Self-pay | Admitting: Cardiology

## 2018-04-08 NOTE — Telephone Encounter (Signed)
New message   Pt c/o medication issue:  1. Name of Medication: Midodrine  2. How are you currently taking this medication (dosage and times per day)? 2.5mg  3x daily  3. Are you having a reaction (difficulty breathing--STAT)? No  4. What is your medication issue? PT says this morning her BP was 88/62  She also is feeling nauseous and threw up Saturday, also burps up food or a sour taste

## 2018-04-08 NOTE — Telephone Encounter (Signed)
Returned pt call. Pt sts that that her Midodrine was increased from bid to tid on 04/05/18 by Isaac Laud Meng,PA Pt sts that she has been nauseated and vomited one time over the weekend since the increase. Her BP this morning one after medication 88/62. After her second dose of Midodrine her systolic BP went up to 813, she did not record the diastolic. She is taking Midodrine with food. The medication is causing her to burp up a sour taste that is unpleasant for her. She does try to stay hydrated and she will have a salty snack when her BP is lower. She is not able to tolerate compression stockings, she does not like anything on her toes. She would like to be on the tid dosage but feels that she is not able to tolerate, she wonders if she needs to reduce back to bid. Adv pt that I will fwd an update to Dr.Jordan and call back with his recommendation. Pt voiced appreciation for the call back.

## 2018-04-08 NOTE — Telephone Encounter (Signed)
She can cut back to bid on midodrine. If she has a day when BP is running lower she can take the third dose.  Alando Colleran [name] MD, [facility]

## 2018-04-08 NOTE — Telephone Encounter (Signed)
Spoke to patient Dr.Jordan's recommendation given.Advised to call back if needed.

## 2018-04-17 ENCOUNTER — Ambulatory Visit (INDEPENDENT_AMBULATORY_CARE_PROVIDER_SITE_OTHER): Payer: Medicare Other | Admitting: Pharmacist Clinician (PhC)/ Clinical Pharmacy Specialist

## 2018-04-17 DIAGNOSIS — Z7901 Long term (current) use of anticoagulants: Secondary | ICD-10-CM | POA: Diagnosis not present

## 2018-04-17 DIAGNOSIS — Z5181 Encounter for therapeutic drug level monitoring: Secondary | ICD-10-CM

## 2018-04-17 DIAGNOSIS — I4891 Unspecified atrial fibrillation: Secondary | ICD-10-CM | POA: Diagnosis not present

## 2018-04-17 LAB — POCT INR: INR: 1.8 — AB (ref 2.0–3.0)

## 2018-04-29 ENCOUNTER — Other Ambulatory Visit: Payer: Self-pay | Admitting: Cardiology

## 2018-04-29 MED ORDER — FLUDROCORTISONE ACETATE 0.1 MG PO TABS: ORAL_TABLET | ORAL | 3 refills | Status: DC

## 2018-04-29 MED ORDER — MIDODRINE HCL 2.5 MG PO TABS: 2.5000 mg | ORAL_TABLET | Freq: Three times a day (TID) | ORAL | 3 refills | Status: DC

## 2018-04-29 MED ORDER — ROSUVASTATIN CALCIUM 10 MG PO TABS: 10.0000 mg | ORAL_TABLET | Freq: Every day | ORAL | 3 refills | Status: AC

## 2018-04-29 NOTE — Telephone Encounter (Signed)
°*  STAT* If patient is at the pharmacy, call can be transferred to refill team.   1. Which medications need to be refilled? (please list name of each medication and dose if known)  rosuvastatin (CRESTOR) 10 MG tablet  fludrocortisone (FLORINEF) 0.1 MG tablet  midodrine (PROAMATINE) 2.5 MG tablet  2. Which pharmacy/location (including street and city if local pharmacy) is medication to be sent to? [facility], [place]  3. Do they need a 30 day or 90 day supply? [place]

## 2018-04-30 ENCOUNTER — Ambulatory Visit (INDEPENDENT_AMBULATORY_CARE_PROVIDER_SITE_OTHER): Payer: Medicare Other | Admitting: *Deleted

## 2018-04-30 DIAGNOSIS — I4891 Unspecified atrial fibrillation: Secondary | ICD-10-CM | POA: Diagnosis not present

## 2018-04-30 DIAGNOSIS — I495 Sick sinus syndrome: Secondary | ICD-10-CM

## 2018-05-01 ENCOUNTER — Other Ambulatory Visit: Payer: Self-pay

## 2018-05-01 LAB — CUP PACEART REMOTE DEVICE CHECK
Battery Impedance: 953 Ohm
Battery Remaining Longevity: 66 mo
Battery Voltage: 2.77 V
Brady Statistic AP VP Percent: 0 %
Brady Statistic AP VS Percent: 59 %
Brady Statistic AS VP Percent: 0 %
Brady Statistic AS VS Percent: 41 %
Date Time Interrogation Session: 20200225142537
Implantable Lead Implant Date: 20040309
Implantable Lead Implant Date: 20040309
Implantable Lead Location: 753859
Implantable Lead Location: 753860
Implantable Lead Model: 4458
Implantable Lead Serial Number: 304362
Implantable Pulse Generator Implant Date: 20140409
Lead Channel Impedance Value: 659 Ohm
Lead Channel Pacing Threshold Amplitude: 0.75 V
Lead Channel Pacing Threshold Pulse Width: 0.4 ms
Lead Channel Pacing Threshold Pulse Width: 0.4 ms
Lead Channel Setting Pacing Amplitude: 2 V
Lead Channel Setting Pacing Amplitude: 2.5 V
Lead Channel Setting Pacing Pulse Width: 0.4 ms
Lead Channel Setting Sensing Sensitivity: 5.6 mV
MDC IDC MSMT LEADCHNL RA IMPEDANCE VALUE: 1017 Ohm
MDC IDC MSMT LEADCHNL RV PACING THRESHOLD AMPLITUDE: 0.375 V

## 2018-05-03 ENCOUNTER — Other Ambulatory Visit: Payer: Self-pay

## 2018-05-03 MED ORDER — FLUDROCORTISONE ACETATE 0.1 MG PO TABS: 0.1000 mg | ORAL_TABLET | Freq: Two times a day (BID) | ORAL | 3 refills | Status: DC

## 2018-05-03 NOTE — Telephone Encounter (Signed)
Rx(s) sent to pharmacy electronically.  

## 2018-05-07 NOTE — Progress Notes (Signed)
Remote pacemaker transmission.   

## 2018-05-12 ENCOUNTER — Emergency Department (HOSPITAL_COMMUNITY)
Admission: EM | Admit: 2018-05-12 | Discharge: 2018-05-12 | Disposition: A | Discharge status: Home / Self Care | Payer: Medicare Other | Attending: Emergency Medicine | Admitting: Emergency Medicine

## 2018-05-12 ENCOUNTER — Other Ambulatory Visit: Payer: Self-pay

## 2018-05-12 ENCOUNTER — Emergency Department (HOSPITAL_COMMUNITY): Payer: Medicare Other

## 2018-05-12 ENCOUNTER — Encounter (HOSPITAL_COMMUNITY): Payer: Self-pay | Admitting: Emergency Medicine

## 2018-05-12 DIAGNOSIS — I480 Paroxysmal atrial fibrillation: Secondary | ICD-10-CM | POA: Insufficient documentation

## 2018-05-12 DIAGNOSIS — I10 Essential (primary) hypertension: Secondary | ICD-10-CM | POA: Diagnosis not present

## 2018-05-12 DIAGNOSIS — E039 Hypothyroidism, unspecified: Secondary | ICD-10-CM | POA: Diagnosis not present

## 2018-05-12 DIAGNOSIS — R441 Visual hallucinations: Secondary | ICD-10-CM | POA: Diagnosis not present

## 2018-05-12 DIAGNOSIS — R42 Dizziness and giddiness: Secondary | ICD-10-CM | POA: Diagnosis not present

## 2018-05-12 DIAGNOSIS — Z7901 Long term (current) use of anticoagulants: Secondary | ICD-10-CM | POA: Insufficient documentation

## 2018-05-12 DIAGNOSIS — Z79899 Other long term (current) drug therapy: Secondary | ICD-10-CM | POA: Insufficient documentation

## 2018-05-12 DIAGNOSIS — Z8673 Personal history of transient ischemic attack (TIA), and cerebral infarction without residual deficits: Secondary | ICD-10-CM | POA: Diagnosis not present

## 2018-05-12 DIAGNOSIS — R4182 Altered mental status, unspecified: Secondary | ICD-10-CM | POA: Insufficient documentation

## 2018-05-12 DIAGNOSIS — R443 Hallucinations, unspecified: Secondary | ICD-10-CM | POA: Diagnosis not present

## 2018-05-12 LAB — COMPREHENSIVE METABOLIC PANEL
ALT: 13 U/L (ref 0–44)
AST: 19 U/L (ref 15–41)
Albumin: 3.9 g/dL (ref 3.5–5.0)
Alkaline Phosphatase: 63 U/L (ref 38–126)
Anion gap: 6 (ref 5–15)
BUN: 29 mg/dL — ABNORMAL HIGH (ref 8–23)
CHLORIDE: 109 mmol/L (ref 98–111)
CO2: 26 mmol/L (ref 22–32)
Calcium: 8.7 mg/dL — ABNORMAL LOW (ref 8.9–10.3)
Creatinine, Ser: 1.32 mg/dL — ABNORMAL HIGH (ref 0.44–1.00)
GFR calc Af Amer: 42 mL/min — ABNORMAL LOW (ref >60)
GFR calc non Af Amer: 36 mL/min — ABNORMAL LOW (ref >60)
Glucose, Bld: 92 mg/dL (ref 70–99)
Potassium: 3.7 mmol/L (ref 3.5–5.1)
Sodium: 141 mmol/L (ref 135–145)
Total Bilirubin: 0.6 mg/dL (ref 0.3–1.2)
Total Protein: 6.2 g/dL — ABNORMAL LOW (ref 6.5–8.1)

## 2018-05-12 LAB — CBG MONITORING, ED: Glucose-Capillary: 72 mg/dL (ref 70–99)

## 2018-05-12 LAB — CBC
HCT: 38.8 % (ref 36.0–46.0)
Hemoglobin: 12.4 g/dL (ref 12.0–15.0)
MCH: 31.5 pg (ref 26.0–34.0)
MCHC: 32 g/dL (ref 30.0–36.0)
MCV: 98.5 fL (ref 80.0–100.0)
Platelets: 108 10*3/uL — ABNORMAL LOW (ref 150–400)
RBC: 3.94 MIL/uL (ref 3.87–5.11)
RDW: 13.3 % (ref 11.5–15.5)
WBC: 4.7 10*3/uL (ref 4.0–10.5)
nRBC: 0 % (ref 0.0–0.2)

## 2018-05-12 LAB — PROTIME-INR
INR: 1.9 — ABNORMAL HIGH (ref 0.8–1.2)
Prothrombin Time: 21.7 seconds — ABNORMAL HIGH (ref 11.4–15.2)

## 2018-05-12 MED ORDER — SODIUM CHLORIDE 0.9 % IV BOLUS
1000.0000 mL | Freq: Once | INTRAVENOUS | Status: AC
  Administered 2018-05-12: 1000 mL via INTRAVENOUS

## 2018-05-12 MED ORDER — SODIUM CHLORIDE 0.9% FLUSH
3.0000 mL | Freq: Once | INTRAVENOUS | Status: AC
  Administered 2018-05-12: 3 mL via INTRAVENOUS

## 2018-05-12 NOTE — ED Notes (Signed)
Patient transported to X-ray 

## 2018-05-12 NOTE — Progress Notes (Signed)
Patient is seen by me via tele-psych and I consulted with Dr. Dwyane Dee.  Patient denies any suicidal homicidal ideations.  She does report having people in her house but per the daughter and the police there have been no one in her house.  Patient reports that the people are very nice and they have some children that are very nice to her.  She states that they are not scary and then it make her do anything that she should not. Contact patient's daughter who was in the room with her to speak to the daughter separately and there is concerns for the hallucinations however she does report that the patient had a stroke and that she recently found out her brother has cancer and that has put some stress on her.  However there is no concern for safety for the patient is concern for the hallucinations.  Patient's daughter states that she has been trying to get her into a facility and the patient will absolutely refuse to go to any other place but her own home.  She is informed that the patient does not meet inpatient criteria and she states agreement and request for the EDP to suggest that the patient follow-up with neuro as the patient does not follow directions from the family very well.  I contacted Dr. Vanita Panda and notified him of the recommendations and he stated he was suggested follow-up with neuro as well as a change of medications to see if this would benefit patient's hallucinations.  At this time patient does not meet inpatient criteria and is psychiatrically cleared.

## 2018-05-12 NOTE — ED Triage Notes (Addendum)
Patient arrives with her daughter who reports that patient is seeing people in her home that are not there. She reports patient has called her on two occasions stating someone was in her house and would not leave. Patient a/ox4, however she does report that she has seen people in her home who "get out so fast without being seen." patient reports hx of CVA with L sided residual weakness. Pts speech clear, face symmetrical, mild weakness to L hand grip which patient reports is baseline.

## 2018-05-12 NOTE — ED Notes (Signed)
Pt transported to radiology.

## 2018-05-12 NOTE — BH Assessment (Signed)
Assessment Note  Katrina Rodriguez is an 85 y.o. female who presented to [facility] (accompanied by daughter) with complaint of hallucination and possibly delusion.  Pt lives alone in [place] (has lived alone for 23 years), and she is retired.  Pt has not been assessed by TTS before, and she denied any past or current psychiatric care.  Pt and daughter [name] provided history.    Per report, Pt had a stroke in 2019.  Pt reported that over the last two days, she has had experienced silent people walk into her home (even though the doors are locked and the house is secure); she does not know these people, and she believes them to be foreign as they do not speak.  She said that the experience does not frighten her, even when the people stand on her table and sleep in the beds.  ''I don't think they mean me any harm.''  Per daughter, Pt has called the police several times to report the intruders, and she has also called Pt's daughter several times.  Police and daughter have responded and have found no disturbance.  Pt has no insight into the hallucinatory nature of these experiences.  Pt denied suicidal ideation, homicidal ideation, auditory hallucination, substance use, and self-injurious behavior.  Pt stated that in spite of daughter's concerns, she does not want to live in a nursing home or with others.  Per report, Pt was upset that daughter insisted on bringing her to the hospital for assessment.    During assessment, Pt presented as alert and oriented.  She had good eye contact and was cooperative.  Pt was gowned, and she appeared appropriately groomed.  Pt's mood was euthymic and pleasant.  Affect was friendly and calm.  Pt endorsed hallucination and difficulty with sleep.  Pt's speech was normal in rate, rhythm, and volume.  Thought processes were within normal range, and thought content was logical.  Pt had little insight into the hallucinatory nature of her experience, suggesting delusion.  Pt's memory and  concentration were intact.  Insight, judgment, and impulse control were fair.  Consulted with T. Money, NP, who also spoke with Pt, Pt's daughter, and provider.  Pt does not meet inpatient criteria and will be discharged.  Diagnosis: Hallucination; r/o Dementia; r/o brief psychotic disorder  Past Medical History:  Past Medical History:  Diagnosis Date  . Arthritis   . Atrial fibrillation (HCC)    History of ablation - Dr. Rayann Heman  . Chest pain    Has normal coronaries per cath in 2005; negative nuclear in 2010  . Chronic anemia   . Complete heart block ([place]) 06/09/2013  . H/O: CVA (cardiovascular accident)    Right internal capsule  . Hypothyroidism 06/19/2017  . Left-sided weakness 06/19/2017  . Mild pulmonary hypertension ([place])   . 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 Orthostasis   . Retinal detachment   . Thrombocytopenia ([place])   . TIA (transient ischemic attack)     Past Surgical History:  Procedure Laterality Date  . CARDIAC CATHETERIZATION    . CARDIAC PACEMAKER PLACEMENT    . PERMANENT PACEMAKER GENERATOR CHANGE N/A 06/12/2012   Procedure: PERMANENT PACEMAKER GENERATOR CHANGE;  Surgeon: Evans Lance, MD;  Location: [facility] CATH LAB;  Service: Cardiovascular;  Laterality: N/A;  . RETINAL DETACHMENT SURGERY      Family History:  Family History  Problem Relation Age of Onset  . Heart disease Father   . Stroke Father   . Heart  attack Father   . Heart disease Brother   . Heart disease Brother   . Hypertension Mother   . Diabetes Sister   . Heart attack Sister     Social History:  reports that she has never smoked. She has never used smokeless tobacco. She reports that she does not drink alcohol or use drugs.  Additional Social History:  Alcohol / Drug Use Pain Medications: See MAR Prescriptions: See MAR Over the Counter: See MAR History of alcohol / drug use?: No history of alcohol / drug abuse  CIWA: CIWA-Ar BP: 122/86 Pulse Rate: 62 COWS:    Allergies:   Allergies  Allergen Reactions  . Amiodarone Other (See Comments)    Caused thyroid problems  . Amoxicillin Hives  . Aspirin Other (See Comments)    On tikosyn and warfarin  . Codeine Other (See Comments)    Causes BP to drop  . Darvocet [Propoxyphene N-Acetaminophen] Hives  . Demerol Itching  . Eliquis [Apixaban] Nausea Only  . Penicillins Hives  . Xarelto [Rivaroxaban] Rash    Home Medications: (Not in a hospital admission)   OB/GYN Status:  No LMP recorded. Patient is postmenopausal.  General Assessment Data Assessment unable to be completed: Yes Reason for not completing assessment: numerous assessments at same time Location of Assessment: [facility] ED TTS Assessment: In system Is this a Tele or Face-to-Face Assessment?: Tele Assessment Is this an Initial Assessment or a Re-assessment for this encounter?: Initial Assessment Patient Accompanied by:: Other(Daughter [name]. Arvilla Market) Language Other than English: No Living Arrangements: (Single family home) What gender do you identify as?: Female Marital status: Widowed Pregnancy Status: No Living Arrangements: Alone Can pt return to current living arrangement?: Yes Admission Status: Voluntary Is patient capable of signing voluntary admission?: Yes Referral Source: Self/Family/Friend(Daughter) Insurance type: [facility] MCR     Crisis Care Plan Living Arrangements: Alone Legal Guardian: (None) Name of Psychiatrist: None Name of Therapist: None  Education Status Is patient currently in school?: No Is the patient employed, unemployed or receiving disability?: Unemployed(Retired)  Risk to self with the past 6 months Suicidal Ideation: No Has patient been a risk to self within the past 6 months prior to admission? : No Suicidal Intent: No Has patient had any suicidal intent within the past 6 months prior to admission? : No Is patient at risk for suicide?: No Suicidal Plan?: No Has patient had any suicidal plan within the past 6  months prior to admission? : No Access to Means: No What has been your use of drugs/alcohol within the last 12 months?: denied Previous Attempts/Gestures: No Family Suicide History: No Recent stressful life event(s): Other (Comment)(Last living brother has cancer; hx of stroke) Persecutory voices/beliefs?: No Depression: No Depression Symptoms: Insomnia Substance abuse history and/or treatment for substance abuse?: No Suicide prevention information given to non-admitted patients: Not applicable  Risk to Others within the past 6 months Homicidal Ideation: No Does patient have any lifetime risk of violence toward others beyond the six months prior to admission? : No Thoughts of Harm to Others: No Current Homicidal Intent: No Current Homicidal Plan: No Access to Homicidal Means: No History of harm to others?: No Assessment of Violence: None Noted Does patient have access to weapons?: No Criminal Charges Pending?: No Does patient have a court date: No Is patient on probation?: No  Psychosis Hallucinations: Visual(Recent hallucination ) Delusions: Unspecified(Believes hallucination; no insight)  Mental Status Report Appearance/Hygiene: In scrubs, Unremarkable Eye Contact: Good Motor Activity: Unremarkable Speech: Logical/coherent Level of  Consciousness: Alert Mood: Euthymic, Pleasant Affect: Appropriate to circumstance Anxiety Level: None Thought Processes: Coherent, Relevant Judgement: Partial Orientation: Person, Place, Time, Situation Obsessive Compulsive Thoughts/Behaviors: None  Cognitive Functioning Concentration: Normal Memory: Recent Intact, Remote Intact Is patient IDD: No Insight: Poor Impulse Control: Good Appetite: Good Have you had any weight changes? : No Change Sleep: Decreased  ADLScreening [facility] Assessment Services) Patient's cognitive ability adequate to safely complete daily activities?: Yes Patient able to express need for assistance with ADLs?:  Yes Independently performs ADLs?: Yes (appropriate for developmental age)  Prior Inpatient Therapy Prior Inpatient Therapy: No  Prior Outpatient Therapy Prior Outpatient Therapy: No Does patient have an ACCT team?: No Does patient have Intensive In-House Services?  : No Does patient have [facility] services? : No Does patient have P4CC services?: No  ADL Screening (condition at time of admission) Patient's cognitive ability adequate to safely complete daily activities?: Yes Is the patient deaf or have difficulty hearing?: No Does the patient have difficulty seeing, even when wearing glasses/contacts?: No Does the patient have difficulty concentrating, remembering, or making decisions?: No Patient able to express need for assistance with ADLs?: Yes Does the patient have difficulty dressing or bathing?: No Independently performs ADLs?: Yes (appropriate for developmental age) Does the patient have difficulty walking or climbing stairs?: No Weakness of Legs: None Weakness of Arms/Hands: None  Home Assistive Devices/Equipment Home Assistive Devices/Equipment: None  Therapy Consults (therapy consults require a physician order) PT Evaluation Needed: No OT Evalulation Needed: No SLP Evaluation Needed: No Abuse/Neglect Assessment (Assessment to be complete while patient is alone) Abuse/Neglect Assessment Can Be Completed: Yes Physical Abuse: Denies Verbal Abuse: Denies Sexual Abuse: Denies Exploitation of patient/patient's resources: Denies Self-Neglect: Denies Values / Beliefs Cultural Requests During Hospitalization: None Spiritual Requests During Hospitalization: None Consults Spiritual Care Consult Needed: No Social Work Consult Needed: No Regulatory affairs officer (For Healthcare) Does Patient Have a Medical Advance Directive?: No          Disposition:  Disposition Initial Assessment Completed for this Encounter: Yes Disposition of Patient: Discharge(Per T. Money, NP, Pt  does not meet inpt criteria)  On Site Evaluation by:   Reviewed with Physician:    Laurena Slimmer Zyla Dascenzo 05/12/2018 1:49 PM

## 2018-05-12 NOTE — ED Provider Notes (Signed)
[facility] EMERGENCY DEPARTMENT Provider Note   CSN: [id number] Arrival date & time: 05/12/18  8341    History   Chief Complaint Chief Complaint  Patient presents with  . Altered Mental Status    HPI Katrina Rodriguez is a 85 y.o. female.     HPI Patient presents at the behest of family members due to concern of hallucination. Patient herself states that she did not want to come for evaluation, but acknowledges recently seeing a group of individuals, who are seemingly not present. She notes that over the past 2 days that she has had these visions, has called the police due to concern for intruders. She denies new physical pain, lightheadedness, syncope.  She does have a history of lightheadedness, states that she takes her medication as directed, including salt supplement due to reported orthostatic lightheadedness. She denies other recent medication change, diet change, activity change. She acknowledges a history of prior stroke, states that she has some ongoing left leg weakness, unchanged, as well as loss of vision, unchanged.  Past Medical History:  Diagnosis Date  . Arthritis   . Atrial fibrillation (HCC)    History of ablation - Dr. Rayann Heman  . Chest pain    Has normal coronaries per cath in 2005; negative nuclear in 2010  . Chronic anemia   . Complete heart block ([place]) 06/09/2013  . H/O: CVA (cardiovascular accident)    Right internal capsule  . Hypothyroidism 06/19/2017  . Left-sided weakness 06/19/2017  . Mild pulmonary hypertension ([place])   . 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 Orthostasis   . Retinal detachment   . Thrombocytopenia ([place])   . TIA (transient ischemic attack)     Patient Active Problem List   Diagnosis Date Noted  . Hypothyroidism 06/19/2017  . Left-sided weakness 06/19/2017  . 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 Encounter for therapeutic drug monitoring 06/09/2013  . Complete heart block ([place]) 06/09/2013  . Syncope  04/24/2013  . H/O   . PAROXYSMAL ATRIAL FIBRILLATION 02/02/2010  . 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 DEEP VENOUS THROMBOPHLEBITIS 09/17/2008  . HYPOTENSION, ORTHOSTATIC 09/17/2008  . SYNCOPE 09/17/2008  . CHEST PAIN 09/17/2008  . PPM-Medtronic 09/17/2008    Past Surgical History:  Procedure Laterality Date  . CARDIAC CATHETERIZATION    . CARDIAC PACEMAKER PLACEMENT    . PERMANENT PACEMAKER GENERATOR CHANGE N/A 06/12/2012   Procedure: PERMANENT PACEMAKER GENERATOR CHANGE;  Surgeon: Evans Lance, MD;  Location: [facility] CATH LAB;  Service: Cardiovascular;  Laterality: N/A;  . RETINAL DETACHMENT SURGERY       OB History   No obstetric history on file.      Home Medications    Prior to Admission medications   Medication Sig Start Date End Date Taking? Authorizing Provider  Dofetilide (TIKOSYN PO) Tikosyn    [provider]  fludrocortisone (FLORINEF) 0.1 MG tablet Take 1 tablet (0.1 mg total) by mouth 2 (two) times daily. 05/03/18   [name], Peter M, MD  midodrine (PROAMATINE) 2.5 MG tablet Take 1 tablet (2.5 mg total) by mouth 3 (three) times daily with meals. 04/29/18   [name], Peter M, MD  potassium chloride SA (K-DUR,KLOR-CON) 20 MEQ tablet Take 2 tablets (40 mEq total) by mouth daily. 02/06/18   [name], Peter M, MD  rosuvastatin (CRESTOR) 10 MG tablet Take 1 tablet (10 mg total) by mouth daily. 04/29/18 07/28/18  [name], Peter M, MD  warfarin (COUMADIN) 4 MG tablet Take 1/2 to 1  tablet daily as directed by coumadin clinic 12/28/17   [name], Peter M, MD    Family History Family History  Problem Relation Age of Onset  . Heart disease Father   . Stroke Father   . Heart attack Father   . Heart disease Brother   . Heart disease Brother   . Hypertension Mother   . Diabetes Sister   . Heart attack Sister     Social History Social History   Tobacco Use  . Smoking status: Never Smoker  . Smokeless tobacco: Never Used  Substance Use Topics  . Alcohol use:  Never    Frequency: Never  . Drug use: Never     Allergies   Amiodarone; Amoxicillin; Aspirin; Codeine; Darvocet [propoxyphene n-acetaminophen]; Demerol; Eliquis [apixaban]; Penicillins; and Xarelto [rivaroxaban]   Review of Systems Review of Systems  Constitutional:       Per HPI, otherwise negative  HENT:       Per HPI, otherwise negative  Respiratory:       Per HPI, otherwise negative  Cardiovascular:       Per HPI, otherwise negative  Gastrointestinal: Negative for vomiting.  Endocrine:       Negative aside from HPI  Genitourinary:       Neg aside from HPI   Musculoskeletal:       Per HPI, otherwise negative  Skin: Negative.   Neurological: Positive for light-headedness. Negative for syncope.  Psychiatric/Behavioral: Positive for hallucinations.     Physical Exam Updated Vital Signs BP (!) 146/65   Pulse 60   Temp (!) 97.3 F (36.3 C) (Oral)   Resp 12   SpO2 100%   Physical Exam Vitals signs and nursing note reviewed.  Constitutional:      General: She is not in acute distress.    Appearance: She is well-developed.     Comments: Sickly appearing elderly female awake, alert.  HENT:     Head: Normocephalic and atraumatic.  Eyes:     Conjunctiva/sclera: Conjunctivae normal.  Cardiovascular:     Rate and Rhythm: Normal rate and regular rhythm.  Pulmonary:     Effort: Pulmonary effort is normal. No respiratory distress.     Breath sounds: Normal breath sounds. No stridor.  Abdominal:     General: There is no distension.  Skin:    General: Skin is warm and dry.  Neurological:     Mental Status: She is alert and oriented to person, place, and time.     Cranial Nerves: No cranial nerve deficit.     Comments: Diffuse atrophy, poor vision, poor hearing, but cranial nerves otherwise unremarkable. Left leg 4/5, right leg, 5/5, throughout otherwise strength exam unremarkable  Psychiatric:     Comments: Patient is awake alert, interactive, has some insight  into her visions, which she notes as possible hallucinations.      ED Treatments / Results  Labs (all labs ordered are listed, but only abnormal results are displayed) Labs Reviewed  COMPREHENSIVE METABOLIC PANEL - Abnormal; Notable for the following components:      Result Value   BUN 29 (*)    Creatinine, Ser 1.32 (*)    Calcium 8.7 (*)    Total Protein 6.2 (*)    GFR calc non Af Amer 36 (*)    GFR calc Af Amer 42 (*)    All other components within normal limits  CBC - Abnormal; Notable for the following components:   Platelets 108 (*)    All other  components within normal limits  PROTIME-INR - Abnormal; Notable for the following components:   Prothrombin Time 21.7 (*)    INR 1.9 (*)    All other components within normal limits  CBG MONITORING, ED    EKG None  Radiology Dg Chest 2 View  Result Date: 05/12/2018 CLINICAL DATA:  Altered mental status EXAM: CHEST - 2 VIEW COMPARISON:  06/19/2017 FINDINGS: Cardiomegaly with left chest multi lead pacer. Both lungs are clear. The visualized skeletal structures are unremarkable. IMPRESSION: Cardiomegaly without evidence of acute cardiopulmonary disease. Electronically Signed   By: Eddie Candle M.D.   On: 05/12/2018 10:11   Ct Head Wo Contrast  Result Date: 05/12/2018 CLINICAL DATA:  Altered mental status EXAM: CT HEAD WITHOUT CONTRAST TECHNIQUE: Contiguous axial images were obtained from the base of the skull through the vertex without intravenous contrast. COMPARISON:  06/21/2017 FINDINGS: Brain: No evidence of acute infarction, hemorrhage, hydrocephalus, extra-axial collection or mass lesion/mass effect. Periventricular white matter hypodensity and global volume loss. Vascular: No hyperdense vessel or unexpected calcification. Skull: Normal. Negative for fracture or focal lesion. Sinuses/Orbits: No acute finding. High-density vitreous of the left globe. Other: None. IMPRESSION: 1.  No acute intracranial pathology. 2. Small-vessel  white matter disease and global volume loss in keeping with advanced patient age. Electronically Signed   By: Eddie Candle M.D.   On: 05/12/2018 10:32    Procedures Procedures (including critical care time)  Medications Ordered in ED Medications  sodium chloride flush (NS) 0.9 % injection 3 mL (3 mLs Intravenous Given 05/12/18 0919)  sodium chloride 0.9 % bolus 1,000 mL (0 mLs Intravenous Stopped 05/12/18 1030)     Initial Impression / Assessment and Plan / ED Course  I have reviewed the triage vital signs and the nursing notes.  Pertinent labs & imaging results that were available during my care of the patient were reviewed by me and considered in my medical decision making (see chart for details).    Show labs notable for elevated creatinine, BUN. Patient receiving fluids empirically.   On repeat exam the patient is in no distress, awake and alert. She is now accompanied by her daughter. We discussed the patient's recent development of visual hallucination, her hesitancy to consider nursing home placement, and her recent addition of Midrin to her medication regimen for orthostasis.  On repeat exam the patient is in similar condition, remains hemodynamically unremarkable. The patient's daughter and her are aware of all findings thus far. With consideration of possible other causes for psychiatric disease, daughter requests behavioral health evaluation.   3:02 PM Behavioral health is seen and evaluated the patient and I have discussed her evaluation with their practitioner. On review exam the patient remains in no distress, awake, alert, with no ongoing constipation, though she acknowledges the frequency of these recently. We discussed possibilities for her new hallucinations, and with consideration of new medication effect, patient will stop Midrin Patient will also follow-up with neurology and primary care Absent other alarming findings, with no evidence for distress, the patient  was discharged in stable condition.  Final Clinical Impressions(s) / ED Diagnoses   Final diagnoses:  Hallucination, visual    ED Discharge Orders         [place]     05/12/18 1500    Face-to-face encounter (required for Medicare/Medicaid patients)    Comments:  I Carmin Muskrat certify that this patient is under my care and that I, or a nurse practitioner or physician's  assistant working with me, had a face-to-face encounter that meets the physician face-to-face encounter requirements with this patient on 05/12/2018. The encounter with the patient was in whole, or in part for the following medical condition(s) which is the primary reason for home health care (List medical condition): additional orders per primary care doctor.   05/12/18 1500           Carmin Muskrat, MD 05/12/18 [phone]

## 2018-05-12 NOTE — ED Notes (Signed)
Pt is not sure as to why she is here. Pt feels that her daughter thinks she is crazy. Pt is in no pain or distress.

## 2018-05-12 NOTE — ED Notes (Signed)
Patient verbalizes understanding of discharge instructions. Opportunity for questioning and answers were provided. Armband removed by staff, pt discharged from ED home via POV with family. 

## 2018-05-12 NOTE — Discharge Instructions (Signed)
As discussed, your evaluation today has been largely reassuring.  But, it is important that you monitor your condition carefully, and do not hesitate to return to the ED if you develop new, or concerning changes in your condition. ? ?Otherwise, please follow-up with your physician for appropriate ongoing care. ? ?

## 2018-05-15 ENCOUNTER — Ambulatory Visit (INDEPENDENT_AMBULATORY_CARE_PROVIDER_SITE_OTHER): Payer: Medicare Other | Admitting: Pharmacist

## 2018-05-15 ENCOUNTER — Telehealth: Payer: Self-pay | Admitting: Pharmacist

## 2018-05-15 ENCOUNTER — Other Ambulatory Visit: Payer: Self-pay

## 2018-05-15 DIAGNOSIS — I4891 Unspecified atrial fibrillation: Secondary | ICD-10-CM | POA: Diagnosis not present

## 2018-05-15 DIAGNOSIS — Z5181 Encounter for therapeutic drug level monitoring: Secondary | ICD-10-CM

## 2018-05-15 LAB — POCT INR: INR: 2.7 (ref 2.0–3.0)

## 2018-05-15 NOTE — Telephone Encounter (Signed)
Apparently stopped midodrine due to hallucinations per ED note. Ok to hold for now.   Azar South [name] MD, [facility]

## 2018-05-15 NOTE — Telephone Encounter (Signed)
Spoke to patient and her daughter [name] Dr.Jordan advised ok to hold midodrine.Advised to keep appointment as planned with Dr.Jordan and call sooner if needed.

## 2018-05-15 NOTE — Telephone Encounter (Signed)
Neurologist stopped Midodrine 2.5mg  TID and patient will like DR [name] assessment to resume due to symptomatic hypotension.  Please call patient and daughter to clarify instructions on medication.

## 2018-05-17 DIAGNOSIS — E878 Other disorders of electrolyte and fluid balance, not elsewhere classified: Secondary | ICD-10-CM | POA: Diagnosis not present

## 2018-05-17 DIAGNOSIS — R441 Visual hallucinations: Secondary | ICD-10-CM | POA: Diagnosis not present

## 2018-05-20 ENCOUNTER — Other Ambulatory Visit: Payer: Self-pay | Admitting: Cardiology

## 2018-05-20 NOTE — Telephone Encounter (Signed)
°*  STAT* If patient is at the pharmacy, call can be transferred to refill team.   1. Which medications need to be refilled? (please list name of each medication and dose if known) Dofetilide (TIKOSYN PO)  2. Which pharmacy/location (including street and city if local pharmacy) is medication to be sent to? [id number] - [place], [place] [address]  3. Do they need a 30 day or 90 day supply? 90 days

## 2018-05-21 MED ORDER — FLUDROCORTISONE ACETATE 0.1 MG PO TABS: 0.1000 mg | ORAL_TABLET | Freq: Two times a day (BID) | ORAL | 1 refills | Status: AC

## 2018-05-25 ENCOUNTER — Emergency Department (HOSPITAL_COMMUNITY)
Admission: EM | Admit: 2018-05-25 | Discharge: 2018-05-26 | Disposition: A | Discharge status: Home / Self Care | Payer: Medicare Other | Attending: Emergency Medicine | Admitting: Emergency Medicine

## 2018-05-25 ENCOUNTER — Other Ambulatory Visit: Payer: Self-pay

## 2018-05-25 ENCOUNTER — Emergency Department (HOSPITAL_COMMUNITY): Payer: Medicare Other

## 2018-05-25 DIAGNOSIS — F23 Brief psychotic disorder: Secondary | ICD-10-CM | POA: Diagnosis not present

## 2018-05-25 DIAGNOSIS — Z8673 Personal history of transient ischemic attack (TIA), and cerebral infarction without residual deficits: Secondary | ICD-10-CM | POA: Diagnosis not present

## 2018-05-25 DIAGNOSIS — Z8659 Personal history of other mental and behavioral disorders: Secondary | ICD-10-CM | POA: Diagnosis not present

## 2018-05-25 DIAGNOSIS — R442 Other hallucinations: Secondary | ICD-10-CM | POA: Diagnosis not present

## 2018-05-25 DIAGNOSIS — F4322 Adjustment disorder with anxiety: Secondary | ICD-10-CM | POA: Insufficient documentation

## 2018-05-25 DIAGNOSIS — E039 Hypothyroidism, unspecified: Secondary | ICD-10-CM | POA: Diagnosis not present

## 2018-05-25 DIAGNOSIS — R531 Weakness: Secondary | ICD-10-CM | POA: Diagnosis not present

## 2018-05-25 DIAGNOSIS — I10 Essential (primary) hypertension: Secondary | ICD-10-CM | POA: Diagnosis not present

## 2018-05-25 DIAGNOSIS — R441 Visual hallucinations: Secondary | ICD-10-CM | POA: Diagnosis not present

## 2018-05-25 DIAGNOSIS — Z7901 Long term (current) use of anticoagulants: Secondary | ICD-10-CM | POA: Diagnosis not present

## 2018-05-25 DIAGNOSIS — Z79899 Other long term (current) drug therapy: Secondary | ICD-10-CM | POA: Insufficient documentation

## 2018-05-25 DIAGNOSIS — F0390 Unspecified dementia without behavioral disturbance: Secondary | ICD-10-CM | POA: Diagnosis not present

## 2018-05-25 LAB — CBC WITH DIFFERENTIAL/PLATELET
Abs Immature Granulocytes: 0.01 10*3/uL (ref 0.00–0.07)
Basophils Absolute: 0 10*3/uL (ref 0.0–0.1)
Basophils Relative: 1 %
EOS ABS: 0 10*3/uL (ref 0.0–0.5)
Eosinophils Relative: 1 %
HCT: 39.9 % (ref 36.0–46.0)
Hemoglobin: 12.6 g/dL (ref 12.0–15.0)
Immature Granulocytes: 0 %
LYMPHS ABS: 0.9 10*3/uL (ref 0.7–4.0)
Lymphocytes Relative: 21 %
MCH: 31.4 pg (ref 26.0–34.0)
MCHC: 31.6 g/dL (ref 30.0–36.0)
MCV: 99.5 fL (ref 80.0–100.0)
Monocytes Absolute: 0.5 10*3/uL (ref 0.1–1.0)
Monocytes Relative: 10 %
Neutro Abs: 3 10*3/uL (ref 1.7–7.7)
Neutrophils Relative %: 67 %
Platelets: 107 10*3/uL — ABNORMAL LOW (ref 150–400)
RBC: 4.01 MIL/uL (ref 3.87–5.11)
RDW: 13.5 % (ref 11.5–15.5)
WBC: 4.4 10*3/uL (ref 4.0–10.5)
nRBC: 0 % (ref 0.0–0.2)

## 2018-05-25 LAB — BASIC METABOLIC PANEL
Anion gap: 7 (ref 5–15)
BUN: 20 mg/dL (ref 8–23)
CO2: 26 mmol/L (ref 22–32)
CREATININE: 0.97 mg/dL (ref 0.44–1.00)
Calcium: 9 mg/dL (ref 8.9–10.3)
Chloride: 108 mmol/L (ref 98–111)
GFR calc non Af Amer: 53 mL/min — ABNORMAL LOW (ref >60)
Glucose, Bld: 94 mg/dL (ref 70–99)
Potassium: 3.5 mmol/L (ref 3.5–5.1)
Sodium: 141 mmol/L (ref 135–145)

## 2018-05-25 LAB — RAPID URINE DRUG SCREEN, HOSP PERFORMED
Amphetamines: NOT DETECTED
Barbiturates: NOT DETECTED
Benzodiazepines: NOT DETECTED
Cocaine: NOT DETECTED
Opiates: NOT DETECTED
Tetrahydrocannabinol: NOT DETECTED

## 2018-05-25 LAB — URINALYSIS, ROUTINE W REFLEX MICROSCOPIC
Bilirubin Urine: NEGATIVE
Glucose, UA: NEGATIVE mg/dL
KETONES UR: NEGATIVE mg/dL
Leukocytes,Ua: NEGATIVE
Nitrite: NEGATIVE
Protein, ur: NEGATIVE mg/dL
Specific Gravity, Urine: 1.013 (ref 1.005–1.030)
pH: 5 (ref 5.0–8.0)

## 2018-05-25 LAB — MAGNESIUM: Magnesium: 2.4 mg/dL (ref 1.7–2.4)

## 2018-05-25 LAB — ETHANOL: Alcohol, Ethyl (B): <10 mg/dL (ref <10)

## 2018-05-25 LAB — PROTIME-INR
INR: 2.5 — ABNORMAL HIGH (ref 0.8–1.2)
Prothrombin Time: 26.9 seconds — ABNORMAL HIGH (ref 11.4–15.2)

## 2018-05-25 MED ORDER — DOFETILIDE 250 MCG PO CAPS
250.0000 ug | ORAL_CAPSULE | Freq: Two times a day (BID) | ORAL | Status: DC
  Administered 2018-05-26: 250 ug via ORAL
  Filled 2018-05-25: qty 1

## 2018-05-25 MED ORDER — FLUDROCORTISONE ACETATE 0.1 MG PO TABS
0.1000 mg | ORAL_TABLET | Freq: Two times a day (BID) | ORAL | Status: DC
  Administered 2018-05-25 – 2018-05-26 (×3): 0.1 mg via ORAL
  Filled 2018-05-25 (×3): qty 1

## 2018-05-25 MED ORDER — WARFARIN SODIUM 2 MG PO TABS: 2.0000 mg | ORAL_TABLET | ORAL | Status: DC

## 2018-05-25 MED ORDER — WARFARIN SODIUM 4 MG PO TABS: 4.0000 mg | ORAL_TABLET | ORAL | Status: DC

## 2018-05-25 MED ORDER — MIDODRINE HCL 2.5 MG PO TABS: 2.5000 mg | ORAL_TABLET | Freq: Three times a day (TID) | ORAL | Status: DC

## 2018-05-25 MED ORDER — POTASSIUM CHLORIDE CRYS ER 20 MEQ PO TBCR
40.0000 meq | EXTENDED_RELEASE_TABLET | Freq: Every day | ORAL | Status: DC
  Administered 2018-05-25 – 2018-05-26 (×2): 40 meq via ORAL
  Filled 2018-05-25 (×2): qty 2

## 2018-05-25 MED ORDER — WARFARIN - PHYSICIAN DOSING INPATIENT: Freq: Every day | Status: DC

## 2018-05-25 MED ORDER — ACETAMINOPHEN 325 MG PO TABS
650.0000 mg | ORAL_TABLET | ORAL | Status: DC | PRN
  Administered 2018-05-25: 650 mg via ORAL

## 2018-05-25 MED ORDER — ONDANSETRON HCL 4 MG PO TABS: 4.0000 mg | ORAL_TABLET | Freq: Three times a day (TID) | ORAL | Status: DC | PRN

## 2018-05-25 MED ORDER — WARFARIN SODIUM 2 MG PO TABS
2.0000 mg | ORAL_TABLET | ORAL | Status: DC
  Administered 2018-05-25: 2 mg via ORAL
  Filled 2018-05-25 (×2): qty 1

## 2018-05-25 MED ORDER — ROSUVASTATIN CALCIUM 10 MG PO TABS
10.0000 mg | ORAL_TABLET | Freq: Every day | ORAL | Status: DC
  Administered 2018-05-25 – 2018-05-26 (×2): 10 mg via ORAL
  Filled 2018-05-25 (×2): qty 1

## 2018-05-25 MED ORDER — ACETAMINOPHEN 325 MG PO TABS
650.0000 mg | ORAL_TABLET | Freq: Once | ORAL | Status: DC
  Filled 2018-05-25: qty 2

## 2018-05-25 NOTE — ED Notes (Addendum)
Up to the bathroom w/ assist. Bed alarm on, call be w/in reach

## 2018-05-25 NOTE — ED Notes (Addendum)
Up to the bathroom w/ assist, bed alarm on, call light w/in reach

## 2018-05-25 NOTE — ED Triage Notes (Signed)
Pt arrives to ED via EMS C/O hallucinations. Unknown time of onset. A+O x 4 per EMS but pt has been seeing imaginary dogs and people and mini cars asking EMS if they see them. [employer] on site and disarmed firearm from home. Ambulatory with cane and assistance. Coming from home.   170/90   HR70 afib blood thinner  R16  97%

## 2018-05-25 NOTE — ED Provider Notes (Signed)
[place] DEPT [facility] Provider Note: Georgena Spurling, MD, FACEP  CSN: [id number] MRN: [id number] ARRIVAL: 05/25/18 at [place]: WA30/WA30   CHIEF COMPLAINT  Hallucinations   HISTORY OF PRESENT ILLNESS  05/25/18 5:59 AM Katrina Rodriguez is a 85 y.o. female who is blind in the left eye and visually impaired in the right eye.  She is here after calling police this morning complaining of unwanted people in her house, including one who brought a dog.  She states these people of been coming into her house for at least 2 weeks.  Police found her barricaded in her house with a gun.  No other people were found in the house and the house was well-kept.  She was cooperative with police and they removed to the firearm from her.  EMS was called to bring her to the ED for evaluation of hallucinations.  She has no acute somatic complaint but does have a chronic tremor and left-sided weakness status post stroke.  EMS reports she was seeing "little cars" in the back of the ambulance on the way to the ED.   Past Medical History:  Diagnosis Date  . Arthritis   . Atrial fibrillation (HCC)    History of ablation - Dr. Rayann Heman  . Chest pain    Has normal coronaries per cath in 2005; negative nuclear in 2010  . Chronic anemia   . Complete heart block ([place]) 06/09/2013  . H/O: CVA (cardiovascular accident)    Right internal capsule  . Hypothyroidism 06/19/2017  . Left-sided weakness 06/19/2017  . Mild pulmonary hypertension ([place])   . 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 Orthostasis   . Retinal detachment   . Thrombocytopenia ([place])   . TIA (transient ischemic attack)     Past Surgical History:  Procedure Laterality Date  . CARDIAC CATHETERIZATION    . CARDIAC PACEMAKER PLACEMENT    . PERMANENT PACEMAKER GENERATOR CHANGE N/A 06/12/2012   Procedure: PERMANENT PACEMAKER GENERATOR CHANGE;  Surgeon: Evans Lance, MD;  Location: [facility] CATH LAB;  Service: Cardiovascular;  Laterality: N/A;  . RETINAL  DETACHMENT SURGERY      Family History  Problem Relation Age of Onset  . Heart disease Father   . Stroke Father   . Heart attack Father   . Heart disease Brother   . Heart disease Brother   . Hypertension Mother   . Diabetes Sister   . Heart attack Sister     Social History   Tobacco Use  . Smoking status: Never Smoker  . Smokeless tobacco: Never Used  Substance Use Topics  . Alcohol use: Never    Frequency: Never  . Drug use: Never    Prior to Admission medications   Medication Sig Start Date End Date Taking? Authorizing Provider  acetaminophen (TYLENOL) 500 MG tablet Take 500 mg by mouth every 6 (six) hours as needed for moderate pain.   Yes [provider]  dofetilide (TIKOSYN) 250 MCG capsule Take 250 mcg by mouth 2 (two) times daily.  05/21/18  Yes [provider]  fludrocortisone (FLORINEF) 0.1 MG tablet Take 1 tablet (0.1 mg total) by mouth 2 (two) times daily. 05/21/18  Yes [name], Peter M, MD  potassium chloride SA (K-DUR,KLOR-CON) 20 MEQ tablet Take 2 tablets (40 mEq total) by mouth daily. Patient taking differently: Take 20 mEq by mouth daily.  02/06/18  Yes [name], Peter M, MD  rosuvastatin (CRESTOR) 10 MG tablet Take 1 tablet (10 mg total) by  mouth daily. 04/29/18 07/28/18 Yes [name], Peter M, MD  warfarin (COUMADIN) 4 MG tablet Take 1/2 to 1 tablet daily as directed by coumadin clinic Patient taking differently: Take 2-4 mg by mouth as directed. Take 1 tablet (4 mg) every MWF and Take 0.5 tablet (2 mg) all other days. 12/28/17  Yes [name], Peter M, MD    Allergies Amiodarone; Amoxicillin; Aspirin; Codeine; Darvocet [propoxyphene n-acetaminophen]; Demerol; Eliquis [apixaban]; Penicillins; and Xarelto [rivaroxaban]   REVIEW OF SYSTEMS  Negative except as noted here or in the History of Present Illness.   PHYSICAL EXAMINATION  Initial Vital Signs Blood pressure (!) 155/67, pulse 70, temperature 98.1 F (36.7 C), temperature source Oral, resp.  rate 18, height 5\' 4"  (1.626 m), weight 45.4 kg, SpO2 100 %.  Examination General: Well-developed, well-nourished female in no acute distress; appearance consistent with age of record HENT: normocephalic; atraumatic Eyes: pupils equal, round and reactive to light; left lateral strabismus Neck: supple Heart: Irregular rhythm Lungs: clear to auscultation bilaterally Abdomen: soft; nondistended; nontender; bowel sounds present Extremities: No deformity; full range of motion; pulses normal Neurologic: Awake, alert and oriented x 4; motor function intact in all extremities and symmetric; no facial droop Skin: Warm and dry; chronic appearing hyperpigmentation of left ankle Psychiatric: Normal mood and affect   RESULTS  Summary of this visit's results, reviewed by myself:   EKG Interpretation  Date/Time:  Saturday May 25 2018 05:54:59 EDT Ventricular Rate:  75 PR Interval:    QRS Duration: 110 QT Interval:  488 QTC Calculation: 504 R Axis:   5 Text Interpretation:  Atrial-paced complexes Incomplete left bundle branch block Low voltage, precordial leads Prolonged QT interval Previously NSR Confirmed by Shanon Rosser [phone]) on 05/25/2018 5:59:17 AM      Laboratory Studies: Results for orders placed or performed during the hospital encounter of 05/25/18 (from the past 24 hour(s))  Magnesium     Status: None   Collection Time: 05/25/18  6:09 AM  Result Value Ref Range   Magnesium 2.4 1.7 - 2.4 mg/dL  CBC with Differential/Platelet     Status: Abnormal   Collection Time: 05/25/18  6:10 AM  Result Value Ref Range   WBC 4.4 4.0 - 10.5 K/uL   RBC 4.01 3.87 - 5.11 MIL/uL   Hemoglobin 12.6 12.0 - 15.0 g/dL   HCT 39.9 36.0 - 46.0 %   MCV 99.5 80.0 - 100.0 fL   MCH 31.4 26.0 - 34.0 pg   MCHC 31.6 30.0 - 36.0 g/dL   RDW 13.5 11.5 - 15.5 %   Platelets 107 (L) 150 - 400 K/uL   nRBC 0.0 0.0 - 0.2 %   Neutrophils Relative % 67 %   Neutro Abs 3.0 1.7 - 7.7 K/uL   Lymphocytes Relative 21  %   Lymphs Abs 0.9 0.7 - 4.0 K/uL   Monocytes Relative 10 %   Monocytes Absolute 0.5 0.1 - 1.0 K/uL   Eosinophils Relative 1 %   Eosinophils Absolute 0.0 0.0 - 0.5 K/uL   Basophils Relative 1 %   Basophils Absolute 0.0 0.0 - 0.1 K/uL   Immature Granulocytes 0 %   Abs Immature Granulocytes 0.01 0.00 - 0.07 K/uL  Basic metabolic panel     Status: Abnormal   Collection Time: 05/25/18  6:10 AM  Result Value Ref Range   Sodium 141 135 - 145 mmol/L   Potassium 3.5 3.5 - 5.1 mmol/L   Chloride 108 98 - 111 mmol/L   CO2 26 22 -  32 mmol/L   Glucose, Bld 94 70 - 99 mg/dL   BUN 20 8 - 23 mg/dL   Creatinine, Ser 0.97 0.44 - 1.00 mg/dL   Calcium 9.0 8.9 - 10.3 mg/dL   GFR calc non Af Amer 53 (L) >60 mL/min   GFR calc Af Amer >60 >60 mL/min   Anion gap 7 5 - 15  Ethanol     Status: None   Collection Time: 05/25/18  6:10 AM  Result Value Ref Range   Alcohol, Ethyl (B) <10 <10 mg/dL  Urinalysis, Routine w reflex microscopic     Status: Abnormal   Collection Time: 05/25/18  6:41 AM  Result Value Ref Range   Color, Urine YELLOW YELLOW   APPearance CLEAR CLEAR   Specific Gravity, Urine 1.013 1.005 - 1.030   pH 5.0 5.0 - 8.0   Glucose, UA NEGATIVE NEGATIVE mg/dL   Hgb urine dipstick MODERATE (A) NEGATIVE   Bilirubin Urine NEGATIVE NEGATIVE   Ketones, ur NEGATIVE NEGATIVE mg/dL   Protein, ur NEGATIVE NEGATIVE mg/dL   Nitrite NEGATIVE NEGATIVE   Leukocytes,Ua NEGATIVE NEGATIVE   RBC / HPF 11-20 0 - 5 RBC/hpf   WBC, UA 0-5 0 - 5 WBC/hpf   Bacteria, UA RARE (A) NONE SEEN   Squamous Epithelial / LPF 0-5 0 - 5   Mucus PRESENT   Rapid urine drug screen (hospital performed)     Status: None   Collection Time: 05/25/18  6:41 AM  Result Value Ref Range   Opiates NONE DETECTED NONE DETECTED   Cocaine NONE DETECTED NONE DETECTED   Benzodiazepines NONE DETECTED NONE DETECTED   Amphetamines NONE DETECTED NONE DETECTED   Tetrahydrocannabinol NONE DETECTED NONE DETECTED   Barbiturates NONE DETECTED  NONE DETECTED  Protime-INR     Status: Abnormal   Collection Time: 05/25/18  9:29 AM  Result Value Ref Range   Prothrombin Time 26.9 (H) 11.4 - 15.2 seconds   INR 2.5 (H) 0.8 - 1.2   Imaging Studies: Dg Chest 2 View  Result Date: 05/25/2018 CLINICAL DATA:  85 year old female with history of hallucinations. EXAM: CHEST - 2 VIEW COMPARISON:  Chest x-ray 05/12/2018. FINDINGS: Lung volumes are normal. No consolidative airspace disease. No pleural effusions. No pneumothorax. No pulmonary nodule or mass noted. Pulmonary vasculature and the cardiomediastinal silhouette are within normal limits. Aortic atherosclerosis. Left-sided pacemaker device in place with lead tips projecting over the expected location of the right atrium and right ventricle. IMPRESSION: 1.  No radiographic evidence of acute cardiopulmonary disease. 2. Aortic atherosclerosis. Electronically Signed   By: Vinnie Langton M.D.   On: 05/25/2018 07:01    ED COURSE and MDM  Nursing notes and initial vitals signs, including pulse oximetry, reviewed.  Vitals:   05/25/18 0830 05/25/18 0906 05/25/18 1723 05/25/18 2048  BP: (!) 130/59 (!) 158/59 117/61 (!) 143/64  Pulse: (!) 57 69 60 60  Resp: 18  18 16   Temp:   98.6 F (37 C) 97.9 F (36.6 C)  TempSrc:   Oral Oral  SpO2: 97% 99% 100% 95%  Weight:      Height:       6:53 AM The differential diagnosis includes Sherran Needs syndrome but patient is not aware of the unreality of her hallucinations.  We will have TTS assess her.  As she lives alone and is legally blind she may need placement after acute treatment.  PROCEDURES    ED DIAGNOSES     ICD-10-CM   1. Visual hallucinations  R44.1        Cloey Sferrazza, Jenny Reichmann, MD 05/25/18 2237

## 2018-05-25 NOTE — ED Notes (Addendum)
Pt alert, pleasant, introduced self to pt, pt on the phone.  Bed alarms turned on, call light given.  Pt instructed not to get up w/o assistance.  Pt verbalized understanding.

## 2018-05-25 NOTE — ED Notes (Signed)
Up to the bathroom w/ asist

## 2018-05-25 NOTE — ED Notes (Signed)
[facility] pharmacist contacted and reports that because the pt's QTC is prolonged she can not take the Tekocin

## 2018-05-25 NOTE — ED Notes (Signed)
Dr Tiburcio Pea and Theodoro Clock DNP into see.  Pt alert/oriented, denies si/hi at is time.  Pt does report that she sees things that aren't there at times.  Pt reports that  To day she saw a young man with a box and a dog, and she got her gun out.  Pt reports that she is blind in her lt eye and has a problem with the optic nerve in her rt eye.  Pt reports that when the nerve ois flaring up in hjere eye is when she sees these things and that it goes away when the nerve stops.  Pt also reports that she has been feeling weak and shakey recently and has been falling.  Pt lives alone, and has somepne come in to help her with the house and shopping.

## 2018-05-25 NOTE — ED Notes (Addendum)
Up to the bathroom w/ assist, lunch given, bed alarm remains on, call light.  Pt remains pleasant and alert.

## 2018-05-25 NOTE — ED Notes (Addendum)
Eating crackers, on the phone, bed alarm on, call light w/in reach

## 2018-05-25 NOTE — Progress Notes (Signed)
CSW acknowledged consult. Patient was seen by psychiatry and psychiatrist recommended that patient be observed and monitored for 24 hours. CSW to follow up after 24 hour observation to assist with disposition as needed.   Abundio Miu, LCSW Clinical Social Worker [place] [facility]  Cell#: [phone]

## 2018-05-25 NOTE — BH Assessment (Signed)
[place] Assessment Progress Note Case was staffed with Darleene Cleaver MD, Marion Downer who recommended patient be observed and monitored for safety.

## 2018-05-25 NOTE — ED Notes (Addendum)
Pt reports that she did not sleep last  Night because a couple that have 2 children had gotten into her house.  Pt reports that she does not know who they are, or were they live, and that this has happened several times in the past few weeks.  Bed alarm on, call light w/in reach

## 2018-05-25 NOTE — ED Notes (Addendum)
Pt declined supper, or food available in unit, has been eating graham crackers and did eat a roll off of her tray.

## 2018-05-25 NOTE — ED Notes (Signed)
Bed: WA17 Expected date:  Expected time:  Means of arrival:  Comments: EMS 85 yo hallucinating

## 2018-05-25 NOTE — ED Notes (Signed)
Lab will add mag to blood in lab

## 2018-05-25 NOTE — ED Provider Notes (Addendum)
  Physical Exam  BP 124/65 (BP Location: Left Arm)   Pulse 74   Temp 98.1 F (36.7 C) (Oral)   Resp 17   Ht 5\' 4"  (1.626 m)   Wt 45.4 kg   SpO2 97%   BMI 17.16 kg/m   Physical Exam  ED Course/Procedures     Procedures  MDM  Received patient in signout.  Labs reviewed and reassuring.  Recent head CT.  Hallucinations.  Medically cleared.      Davonna Belling, MD 05/25/18 [phone] Patient is reportedly being held for [facility] psych placement.    Davonna Belling, MD 05/25/18 [phone]

## 2018-05-25 NOTE — BH Assessment (Addendum)
Assessment Note  Katrina Rodriguez is an 85 y.o. female that presents this date voluntary with AVH. Patient denies any S/I or H/I although reports she is "afraid of what she might do" when "those men come in to my house." Patient states "strange men have been coming in her house with dogs" and "don't need to be there." Patient's daughter [name] [phone] was contacted this date with patient's permission reported patient has had a sudden onset of AVH for the last two weeks. Patient's daughter reports that patient has not had any mental health history prior to this event. She does acknowledges a history of prior stroke, states that she has some ongoing left leg weakness, unchanged, as well as loss of vision, unchanged. Patient arrives by EMS after contacting [employer] reporting that she is seeing people in her home that are not there. [employer] confirms there were no indication of intruders. [employer] did report patient was noted to have a firearm in her hand at the time of arrival. That firearm per collateral from daughter [name] reports that firearm has been secured. Arvilla Market reports there may be other firearms in that residence and will check this date and secure. Patient was last seen per notes on 05/12/18 when she presented with similar symptoms concerning hallucinations. Patient did not meet inpatient criteria at that time and was to follow up with a neurologist on May 1 to address concerns. Patient denies any prior attempts or gestures at self harm or history of SA use. Patient has not currently been diagnosed with any mental health disorder or under the care of a psychiatric provider. Patient is oriented x4 and presents with a pleasant affect. Patient does report ongoing AVH that she believes to be real. Patient's memory is intact although is vague in reference to the event that transpired earlier this date. Patient is widowed and resides alone. Patient is able to ambulate with a cane and does most of her ADL's without  assistance. Patient does report that "she might need to be in the hospital to keep her from "getting those guys."  Daughter reports patient has called her on two occasions stating someone was in her house and would not leave. Per notes, patient  arrives to ED via EMS with hallucinations. Patient is blind in the left eye and visually impaired in the right eye. She is here after calling police this morning complaining of unwanted people in her house, including one who brought a dog. She states these people have been coming into her house for at least 2 weeks. Police found her barricaded in her house with a gun. No other people were found in the house and the house was well-kept. She was cooperative with police and they removed the firearm from her. EMS was called to bring her to the ED for evaluation of hallucinations. She has no acute somatic complaint but does have a chronic tremor and left-sided weakness status post stroke. EMS reports she was seeing "little cars" in the back of the ambulance on the way to the ED. Ambulatory with cane and assistance. Case was staffed with Darleene Cleaver MD, Marion Downer who recommended patient be observed and monitored for safety.    Diagnosis: Hallucination; r/o Dementia; r/o brief psychotic disorder  Past Medical History:  Past Medical History:  Diagnosis Date  . Arthritis   . Atrial fibrillation (HCC)    History of ablation - Dr. Rayann Heman  . Chest pain    Has normal coronaries per cath in 2005; negative  nuclear in 2010  . Chronic anemia   . Complete heart block ([place]) 06/09/2013  . H/O: CVA (cardiovascular accident)    Right internal capsule  . Hypothyroidism 06/19/2017  . Left-sided weakness 06/19/2017  . Mild pulmonary hypertension ([place])   . 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 Orthostasis   . Retinal detachment   . Thrombocytopenia ([place])   . TIA (transient ischemic attack)     Past Surgical History:  Procedure Laterality Date  . CARDIAC CATHETERIZATION    .  CARDIAC PACEMAKER PLACEMENT    . PERMANENT PACEMAKER GENERATOR CHANGE N/A 06/12/2012   Procedure: PERMANENT PACEMAKER GENERATOR CHANGE;  Surgeon: Evans Lance, MD;  Location: [facility] CATH LAB;  Service: Cardiovascular;  Laterality: N/A;  . RETINAL DETACHMENT SURGERY      Family History:  Family History  Problem Relation Age of Onset  . Heart disease Father   . Stroke Father   . Heart attack Father   . Heart disease Brother   . Heart disease Brother   . Hypertension Mother   . Diabetes Sister   . Heart attack Sister     Social History:  reports that she has never smoked. She has never used smokeless tobacco. She reports that she does not drink alcohol or use drugs.  Additional Social History:  Alcohol / Drug Use Pain Medications: See MAR Prescriptions: See MAR Over the Counter: See MAR History of alcohol / drug use?: No history of alcohol / drug abuse  CIWA: CIWA-Ar BP: (!) 158/59 Pulse Rate: 69 COWS:    Allergies:  Allergies  Allergen Reactions  . Amiodarone Other (See Comments)    Caused thyroid problems  . Amoxicillin Hives  . Aspirin Other (See Comments)    On tikosyn and warfarin  . Codeine Other (See Comments)    Causes BP to drop  . Darvocet [Propoxyphene N-Acetaminophen] Hives  . Demerol Itching  . Eliquis [Apixaban] Nausea Only  . Penicillins Hives  . Xarelto [Rivaroxaban] Rash    Home Medications: (Not in a hospital admission)   OB/GYN Status:  No LMP recorded. Patient is postmenopausal.  General Assessment Data Location of Assessment: [facility] TTS Assessment: In system Is this a Tele or Face-to-Face Assessment?: Face-to-Face Is this an Initial Assessment or a Re-assessment for this encounter?: Initial Assessment Patient Accompanied by:: (NA) Language Other than English: No Living Arrangements: (Home) What gender do you identify as?: Female Marital status: Widowed Pregnancy Status: No Living Arrangements: Alone Can pt return to current living  arrangement?: Yes Admission Status: Voluntary Is patient capable of signing voluntary admission?: Yes Referral Source: Self/Family/Friend Insurance type: [place] MCR   Medical Screening Exam ([place]) Medical Exam completed: Yes  Crisis Care Plan Living Arrangements: Alone Name of Psychiatrist: None Name of Therapist: None  Education Status Is patient currently in school?: No Is the patient employed, unemployed or receiving disability?: Unemployed  Risk to self with the past 6 months Suicidal Ideation: No Has patient been a risk to self within the past 6 months prior to admission? : No Suicidal Intent: No Has patient had any suicidal intent within the past 6 months prior to admission? : No Is patient at risk for suicide?: No Suicidal Plan?: No Has patient had any suicidal plan within the past 6 months prior to admission? : No Access to Means: No What has been your use of drugs/alcohol within the last 12 months?: Denies Previous Attempts/Gestures: No How many times?: 0 Other Self Harm Risks:  NA Triggers for Past Attempts: (NA) Intentional Self Injurious Behavior: None Family Suicide History: No Recent stressful life event(s): Other (Comment)(Decline in health) Persecutory voices/beliefs?: No Depression: Yes Depression Symptoms: Insomnia, Fatigue Substance abuse history and/or treatment for substance abuse?: No Suicide prevention information given to non-admitted patients: Not applicable  Risk to Others within the past 6 months Homicidal Ideation: No Does patient have any lifetime risk of violence toward others beyond the six months prior to admission? : No Thoughts of Harm to Others: No Current Homicidal Intent: No Current Homicidal Plan: No Access to Homicidal Means: No Identified Victim: NA History of harm to others?: No Assessment of Violence: None Noted Violent Behavior Description: NA Does patient have access to weapons?: No Criminal Charges Pending?:  No Does patient have a court date: No Is patient on probation?: No  Psychosis Hallucinations: Auditory, Visual Delusions: Unspecified  Mental Status Report Appearance/Hygiene: In hospital gown Eye Contact: Good Motor Activity: Unremarkable Speech: Logical/coherent Level of Consciousness: Alert Mood: Pleasant Affect: Appropriate to circumstance Anxiety Level: Minimal Thought Processes: Thought Blocking Judgement: Partial Orientation: Person, Place, Time Obsessive Compulsive Thoughts/Behaviors: None  Cognitive Functioning Concentration: Decreased Memory: Recent Impaired Is patient IDD: No Insight: Fair Impulse Control: Fair Appetite: Fair Have you had any weight changes? : No Change Sleep: Decreased Total Hours of Sleep: 3 Vegetative Symptoms: None  ADLScreening [facility] Assessment Services) Patient's cognitive ability adequate to safely complete daily activities?: Yes Patient able to express need for assistance with ADLs?: Yes Independently performs ADLs?: Yes (appropriate for developmental age)  Prior Inpatient Therapy Prior Inpatient Therapy: No  Prior Outpatient Therapy Prior Outpatient Therapy: No Does patient have an ACCT team?: No Does patient have Intensive In-House Services?  : No Does patient have [facility] services? : No Does patient have P4CC services?: No  ADL Screening (condition at time of admission) Patient's cognitive ability adequate to safely complete daily activities?: Yes Is the patient deaf or have difficulty hearing?: No Does the patient have difficulty seeing, even when wearing glasses/contacts?: Yes Does the patient have difficulty concentrating, remembering, or making decisions?: No Patient able to express need for assistance with ADLs?: Yes Does the patient have difficulty dressing or bathing?: No Independently performs ADLs?: Yes (appropriate for developmental age) Does the patient have difficulty walking or climbing stairs?: No Weakness  of Legs: None Weakness of Arms/Hands: None  Home Assistive Devices/Equipment Home Assistive Devices/Equipment: None  Therapy Consults (therapy consults require a physician order) PT Evaluation Needed: No OT Evalulation Needed: No SLP Evaluation Needed: No Abuse/Neglect Assessment (Assessment to be complete while patient is alone) Physical Abuse: Denies Verbal Abuse: Denies Sexual Abuse: Denies Exploitation of patient/patient's resources: Denies Self-Neglect: Denies Values / Beliefs Cultural Requests During Hospitalization: None Spiritual Requests During Hospitalization: None Consults Spiritual Care Consult Needed: No Social Work Consult Needed: No Regulatory affairs officer (For Healthcare) Does Patient Have a Medical Advance Directive?: No Would patient like information on creating a medical advance directive?: No - Patient declined          Disposition: Case was staffed with Darleene Cleaver MD, Reita Cliche DNP who recommended patient be observed and monitored for safety.    Disposition Initial Assessment Completed for this Encounter: Yes Disposition of Patient: Admit Type of inpatient treatment program: Adult Patient refused recommended treatment: No Mode of transportation if patient is discharged/movement?: (Unk)  On Site Evaluation by:   Reviewed with Physician:    Mamie Nick 05/25/2018 9:26 AM

## 2018-05-26 DIAGNOSIS — R441 Visual hallucinations: Secondary | ICD-10-CM | POA: Diagnosis not present

## 2018-05-26 DIAGNOSIS — F4322 Adjustment disorder with anxiety: Secondary | ICD-10-CM | POA: Diagnosis present

## 2018-05-26 LAB — PROTIME-INR
INR: 2.3 — ABNORMAL HIGH (ref 0.8–1.2)
Prothrombin Time: 24.6 seconds — ABNORMAL HIGH (ref 11.4–15.2)

## 2018-05-26 LAB — BASIC METABOLIC PANEL
Anion gap: 6 (ref 5–15)
BUN: 17 mg/dL (ref 8–23)
CO2: 27 mmol/L (ref 22–32)
Calcium: 8.4 mg/dL — ABNORMAL LOW (ref 8.9–10.3)
Chloride: 109 mmol/L (ref 98–111)
Creatinine, Ser: 0.97 mg/dL (ref 0.44–1.00)
GFR calc Af Amer: >60 mL/min (ref >60)
GFR calc non Af Amer: 53 mL/min — ABNORMAL LOW (ref >60)
Glucose, Bld: 118 mg/dL — ABNORMAL HIGH (ref 70–99)
POTASSIUM: 3.9 mmol/L (ref 3.5–5.1)
Sodium: 142 mmol/L (ref 135–145)

## 2018-05-26 LAB — MAGNESIUM: Magnesium: 2.4 mg/dL (ref 1.7–2.4)

## 2018-05-26 MED ORDER — POTASSIUM CHLORIDE CRYS ER 20 MEQ PO TBCR: 40.0000 meq | EXTENDED_RELEASE_TABLET | Freq: Once | ORAL | Status: DC

## 2018-05-26 NOTE — BH Assessment (Signed)
LM with pt's daughter to CB. Per Dr. Darleene Cleaver and Waylan Boga, DNP, pt does not meet IP criteria and can be DC.

## 2018-05-26 NOTE — BH Assessment (Signed)
LM with Katrina Rodriguez [phone] that pt needs to be picked up.  Spoke with pt's daughter again, who said that pt has not had home health since last April after her stroke. She states that if Katrina Rodriguez can't pick pt up, she may be able to find someone else.

## 2018-05-26 NOTE — Progress Notes (Signed)
Patient has been psychiatrically cleared for discharge. Patient is her own legal guardian, daughter is healthcare P.O.A. Patient reportedly lives alone and receives home health services. CSW contacted Case Manager to inquire if patient would be eligible for more frequent home health services, case manager to follow up.  CSW signing off, no social work needs identified. Please reconsult if additional social work needs arise.   Stephanie Acre, Deputy Social Worker [phone]

## 2018-05-26 NOTE — BH Assessment (Signed)
Spoke with pt's friend, Abigail Butts who says that she can pick up pt at 2 pm. Gave pt TCU phone number to call RN when she gets to ED so that she does not have to come in.

## 2018-05-26 NOTE — TOC Initial Note (Signed)
Transition of Care [facility]) - Initial/Assessment Note    Patient Details  Name: Katrina Rodriguez MRN: [id number] Date of Birth: September 06, 1928  [facility] CM/SW Contact:    Erenest Rasher, RN Phone Number: 05/26/2018, 2:56 PM  Clinical Narrative:                 Budd Palmer with [facility] referral. Spoke to dtr and states she is working on getting [place] placed in [place] or [place]F. She had her set up at [facility] ALF but she refused. She has RW at home.   Expected Discharge Plan: [place] Barriers to Discharge: No Barriers Identified   Patient Goals and CMS Choice Patient states their goals for this hospitalization and ongoing recovery are:: concerns about her being at home alone CMS Medicare.gov Compare Post Acute Care list provided to:: Patient Represenative (must comment)(Daughter-Farra,Betty) Choice offered to / list presented to : Adult Children  Expected Discharge Plan and Services Expected Discharge Plan: [place] In-house Referral: Clinical Social Work Discharge Planning Services: CM Consult Post Acute Care Choice: [place] arrangements for the past 2 months: Apartment Expected Discharge Date: 05/26/18               DME Arranged: N/A DME Agency: NA HH Arranged: RN, PT, OT, Nurse's Aide, Social Work [employer] Agency: [place]  Prior Living Arrangements/Services Living arrangements for the past 2 months: [place] with:: Self Patient language and need for interpreter reviewed:: No Do you feel safe going back to the place where you live?: Yes      Need for Family Participation in Patient Care: Yes (Comment) Care giver support system in place?: Yes (comment) Current home services: Homehealth aide Criminal Activity/Legal Involvement Pertinent to Current Situation/Hospitalization: No - Comment as needed  Activities of Daily Living Home Assistive Devices/Equipment: None ADL Screening (condition at time of  admission) Patient's cognitive ability adequate to safely complete daily activities?: Yes Is the patient deaf or have difficulty hearing?: No Does the patient have difficulty seeing, even when wearing glasses/contacts?: Yes Does the patient have difficulty concentrating, remembering, or making decisions?: No Patient able to express need for assistance with ADLs?: Yes Does the patient have difficulty dressing or bathing?: No Independently performs ADLs?: Yes (appropriate for developmental age) Does the patient have difficulty walking or climbing stairs?: No Weakness of Legs: None Weakness of Arms/Hands: None  Permission Sought/Granted Permission sought to share information with : Case Manager, PCP Permission granted to share information with : Yes, Verbal Permission Granted  Share Information with NAME: daughter [name] [facility]           Emotional Assessment Appearance:: Appears stated age Attitude/Demeanor/Rapport: Engaged Affect (typically observed): Accepting Orientation: : Oriented to Self, Oriented to Place, Oriented to  Time, Oriented to Situation   Psych Involvement: No (comment)  Admission diagnosis:  hallucinations Patient Active Problem List   Diagnosis Date Noted  . Adjustment disorder with anxious mood 05/26/2018  . Hypothyroidism 06/19/2017  . Left-sided weakness 06/19/2017  . 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 Encounter for therapeutic drug monitoring 06/09/2013  . Complete heart block ([place]) 06/09/2013  . Syncope 04/24/2013  . H/O   . PAROXYSMAL ATRIAL FIBRILLATION 02/02/2010  . 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 DEEP VENOUS THROMBOPHLEBITIS 09/17/2008  . HYPOTENSION, ORTHOSTATIC 09/17/2008  . SYNCOPE 09/17/2008  . CHEST PAIN 09/17/2008  . PPM-Medtronic 09/17/2008   PCP:  Leighton Ruff, MD Pharmacy:  [facility] [address] [place] [address] Phone:  [phone] Fax: [phone]  [id number] [address], [address] [place] [address] Phone: [phone] Fax: [phone]     Social Determinants of Health (SDOH) Interventions    Readmission Risk Interventions No flowsheet data found.

## 2018-05-26 NOTE — Consult Note (Addendum)
[facility] Psych ED Discharge  05/26/2018 10:59 AM Katrina Rodriguez  MRN:  [id number] Principal Problem: Adjustment disorder with anxious mood Discharge Diagnoses: Principal Problem:   Adjustment disorder with anxious mood  Subjective: 85 yo female who presented to the ED related to her blood pressure being elevated.  When in the ED she mentioned seeing people in her home and not sure if they were real or not.  Thought someone was breaking in her home.  She reported yesterday she has issues with her optic nerve and when this happens she sees things and when it does not, she does not.  Katrina Rodriguez knows what she sees is not real.  Blind in her left eye and this occurs only in her right, confirmed by past appointments in her chart.  No suicidal/homicidal ideations, current hallucinations, or substance abuse.  Kept her to monitor for any concerns, remained stable.    Total Time spent with patient: 45 minutes  Past Psychiatric History: anxiety  Past Medical History:  Past Medical History:  Diagnosis Date  . Arthritis   . Atrial fibrillation (HCC)    History of ablation - Dr. Rayann Heman  . Chest pain    Has normal coronaries per cath in 2005; negative nuclear in 2010  . Chronic anemia   . Complete heart block ([place]) 06/09/2013  . H/O: CVA (cardiovascular accident)    Right internal capsule  . Hypothyroidism 06/19/2017  . Left-sided weakness 06/19/2017  . Mild pulmonary hypertension ([place])   . 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 Orthostasis   . Retinal detachment   . Thrombocytopenia ([place])   . TIA (transient ischemic attack)     Past Surgical History:  Procedure Laterality Date  . CARDIAC CATHETERIZATION    . CARDIAC PACEMAKER PLACEMENT    . PERMANENT PACEMAKER GENERATOR CHANGE N/A 06/12/2012   Procedure: PERMANENT PACEMAKER GENERATOR CHANGE;  Surgeon: Evans Lance, MD;  Location: [facility] CATH LAB;  Service: Cardiovascular;  Laterality: N/A;  . RETINAL DETACHMENT SURGERY     Family History:  Family  History  Problem Relation Age of Onset  . Heart disease Father   . Stroke Father   . Heart attack Father   . Heart disease Brother   . Heart disease Brother   . Hypertension Mother   . Diabetes Sister   . Heart attack Sister    Family Psychiatric  History: none Social History:  Social History   Substance and Sexual Activity  Alcohol Use Never  . Frequency: Never     Social History   Substance and Sexual Activity  Drug Use Never    Social History   Socioeconomic History  . Marital status: Widowed    Spouse name: Not on file  . Number of children: 2  . Years of education: 10  . Highest education level: Not on file  Occupational History  . Occupation: Scientist, research (physical sciences): RETIRED  Social Needs  . Financial resource strain: Not on file  . Food insecurity:    Worry: Not on file    Inability: Not on file  . Transportation needs:    Medical: Not on file    Non-medical: Not on file  Tobacco Use  . Smoking status: Never Smoker  . Smokeless tobacco: Never Used  Substance and Sexual Activity  . Alcohol use: Never    Frequency: Never  . Drug use: Never  . Sexual activity: Not Currently  Lifestyle  . Physical activity:    Days  per week: Not on file    Minutes per session: Not on file  . Stress: Not on file  Relationships  . Social connections:    Talks on phone: Not on file    Gets together: Not on file    Attends religious service: Not on file    Active member of club or organization: Not on file    Attends meetings of clubs or organizations: Not on file    Relationship status: Not on file  Other Topics Concern  . Not on file  Social History Narrative   Lives at home alone.   Caffeine use: Drinks coffee daily   Right handed     Has this patient used any form of tobacco in the last 30 days? (Cigarettes, Smokeless Tobacco, Cigars, and/or Pipes) NA  Current Medications: Current Facility-Administered Medications  Medication Dose Route Frequency Provider  Last Rate Last Dose  . acetaminophen (TYLENOL) tablet 650 mg  650 mg Oral Q4H PRN Molpus, John, MD   650 mg at 05/25/18 2320  . acetaminophen (TYLENOL) tablet 650 mg  650 mg Oral Once Recardo Evangelist, PA-C   Stopped at 05/26/18 0013  . dofetilide (TIKOSYN) capsule 250 mcg  250 mcg Oral BID Davonna Belling, MD   250 mcg at 05/26/18 1008  . fludrocortisone (FLORINEF) tablet 0.1 mg  0.1 mg Oral BID Davonna Belling, MD   0.1 mg at 05/26/18 1009  . ondansetron (ZOFRAN) tablet 4 mg  4 mg Oral Q8H PRN Molpus, John, MD      . potassium chloride SA (K-DUR,KLOR-CON) CR tablet 40 mEq  40 mEq Oral Daily Davonna Belling, MD   40 mEq at 05/26/18 1008  . potassium chloride SA (K-DUR,KLOR-CON) CR tablet 40 mEq  40 mEq Oral Once Fredia Sorrow, MD      . rosuvastatin (CRESTOR) tablet 10 mg  10 mg Oral Daily Davonna Belling, MD   10 mg at 05/26/18 1009  . [START ON 05/27/2018] warfarin (COUMADIN) tablet 4 mg  4 mg Oral Q M,W,F-1800 Davonna Belling, MD       And  . warfarin (COUMADIN) tablet 2 mg  2 mg Oral Q Camelia Phenes, MD   2 mg at 05/25/18 1819  . Warfarin - Physician Dosing Inpatient   Does not apply [id number] Davonna Belling, MD       Current Outpatient Medications  Medication Sig Dispense Refill  . acetaminophen (TYLENOL) 500 MG tablet Take 500 mg by mouth every 6 (six) hours as needed for moderate pain.    Marland Kitchen dofetilide (TIKOSYN) 250 MCG capsule Take 250 mcg by mouth 2 (two) times daily.     . fludrocortisone (FLORINEF) 0.1 MG tablet Take 1 tablet (0.1 mg total) by mouth 2 (two) times daily. 180 tablet 1  . potassium chloride SA (K-DUR,KLOR-CON) 20 MEQ tablet Take 2 tablets (40 mEq total) by mouth daily. (Patient taking differently: Take 20 mEq by mouth daily. ) 180 tablet 3  . rosuvastatin (CRESTOR) 10 MG tablet Take 1 tablet (10 mg total) by mouth daily. 90 tablet 3  . warfarin (COUMADIN) 4 MG tablet Take 1/2 to 1 tablet daily as directed by coumadin clinic (Patient taking  differently: Take 2-4 mg by mouth as directed. Take 1 tablet (4 mg) every MWF and Take 0.5 tablet (2 mg) all other days.) 90 tablet 1   PTA Medications: (Not in a hospital admission)   Musculoskeletal: Strength & Muscle Tone: within normal limits Gait & Station: normal Patient leans: N/A  Psychiatric Specialty Exam: Physical Exam  Nursing note and vitals reviewed. Constitutional: She is oriented to person, place, and time. She appears well-developed and well-nourished.  HENT:  Head: Normocephalic.  Neck: Normal range of motion.  Respiratory: Effort normal.  Musculoskeletal: Normal range of motion.  Neurological: She is alert and oriented to person, place, and time.  Psychiatric: Her speech is normal and behavior is normal. Judgment and thought content normal. Her mood appears anxious. Cognition and memory are normal.    Review of Systems  Psychiatric/Behavioral: The patient is nervous/anxious.   All other systems reviewed and are negative.   Blood pressure 125/66, pulse 60, temperature 98.4 F (36.9 C), temperature source Oral, resp. rate 17, height 5\' 4"  (1.626 m), weight 45.4 kg, SpO2 95 %.Body mass index is 17.16 kg/m.  General Appearance: Casual  Eye Contact:  Good  Speech:  Normal Rate  Volume:  Normal  Mood:  Anxious  Affect:  Congruent  Thought Process:  Coherent and Descriptions of Associations: Intact  Orientation:  Full (Time, Place, and Person)  Thought Content:  WDL and Logical  Suicidal Thoughts:  No  Homicidal Thoughts:  No  Memory:  Immediate;   Good Recent;   Good Remote;   Good  Judgement:  Good  Insight:  Good  Psychomotor Activity:  Normal  Concentration:  Concentration: Good and Attention Span: Good  Recall:  Good  Fund of Knowledge:  Good  Language:  Good  Akathisia:  No  Handed:  Right  AIMS (if indicated):     Assets:  Housing Leisure Time Physical Health Resilience Social Support  ADL's:  Intact  Cognition:  WNL  Sleep:         Demographic Factors:  Age 65 or older, Caucasian and Living alone  Loss Factors: NA  Historical Factors: NA  Risk Reduction Factors:   Sense of responsibility to family and Positive social support  Continued Clinical Symptoms:  Anxiety, mild  Cognitive Features That Contribute To Risk:  None    Suicide Risk:  Minimal: No identifiable suicidal ideation.  Patients presenting with no risk factors but with morbid ruminations; may be classified as minimal risk based on the severity of the depressive symptoms   Plan Of Care/Follow-up recommendations:  Adjustment disorder with mixed disturbance of depression and anxiety: -close monitoring Activity:  as tolerated Diet:  heart healthy diet  Disposition: discharge home Waylan Boga, NP 05/26/2018, 10:59 AM  Patient seen face-to-face for psychiatric evaluation, chart reviewed and case discussed with the physician extender and developed treatment plan. Reviewed the information documented and agree with the treatment plan. Corena Pilgrim, MD

## 2018-05-28 DIAGNOSIS — M150 Primary generalized (osteo)arthritis: Secondary | ICD-10-CM | POA: Diagnosis not present

## 2018-05-28 DIAGNOSIS — R441 Visual hallucinations: Secondary | ICD-10-CM | POA: Diagnosis not present

## 2018-05-28 DIAGNOSIS — D696 Thrombocytopenia, unspecified: Secondary | ICD-10-CM | POA: Diagnosis not present

## 2018-05-28 DIAGNOSIS — F4322 Adjustment disorder with anxiety: Secondary | ICD-10-CM | POA: Diagnosis not present

## 2018-05-28 DIAGNOSIS — E039 Hypothyroidism, unspecified: Secondary | ICD-10-CM | POA: Diagnosis not present

## 2018-05-28 DIAGNOSIS — Z602 Problems related to living alone: Secondary | ICD-10-CM | POA: Diagnosis not present

## 2018-05-28 DIAGNOSIS — H548 Legal blindness, as defined in USA: Secondary | ICD-10-CM | POA: Diagnosis not present

## 2018-05-28 DIAGNOSIS — I69354 Hemiplegia and hemiparesis following cerebral infarction affecting left non-dominant side: Secondary | ICD-10-CM | POA: Diagnosis not present

## 2018-05-28 DIAGNOSIS — D649 Anemia, unspecified: Secondary | ICD-10-CM | POA: Diagnosis not present

## 2018-05-28 DIAGNOSIS — Z950 Presence of cardiac pacemaker: Secondary | ICD-10-CM | POA: Diagnosis not present

## 2018-05-28 DIAGNOSIS — I700 Atherosclerosis of aorta: Secondary | ICD-10-CM | POA: Diagnosis not present

## 2018-05-28 DIAGNOSIS — I442 Atrioventricular block, complete: Secondary | ICD-10-CM | POA: Diagnosis not present

## 2018-05-28 DIAGNOSIS — I272 Pulmonary hypertension, unspecified: Secondary | ICD-10-CM | POA: Diagnosis not present

## 2018-05-28 DIAGNOSIS — H33002 Unspecified retinal detachment with retinal break, left eye: Secondary | ICD-10-CM | POA: Diagnosis not present

## 2018-05-28 DIAGNOSIS — I4891 Unspecified atrial fibrillation: Secondary | ICD-10-CM | POA: Diagnosis not present

## 2018-05-28 DIAGNOSIS — Z86718 Personal history of other venous thrombosis and embolism: Secondary | ICD-10-CM | POA: Diagnosis not present

## 2018-05-28 DIAGNOSIS — H4720 Unspecified optic atrophy: Secondary | ICD-10-CM | POA: Diagnosis not present

## 2018-05-28 DIAGNOSIS — H9190 Unspecified hearing loss, unspecified ear: Secondary | ICD-10-CM | POA: Diagnosis not present

## 2018-05-28 DIAGNOSIS — Z7901 Long term (current) use of anticoagulants: Secondary | ICD-10-CM | POA: Diagnosis not present

## 2018-05-28 DIAGNOSIS — Z9181 History of falling: Secondary | ICD-10-CM | POA: Diagnosis not present

## 2018-05-29 DIAGNOSIS — H33002 Unspecified retinal detachment with retinal break, left eye: Secondary | ICD-10-CM | POA: Diagnosis not present

## 2018-05-29 DIAGNOSIS — H548 Legal blindness, as defined in USA: Secondary | ICD-10-CM | POA: Diagnosis not present

## 2018-05-29 DIAGNOSIS — R441 Visual hallucinations: Secondary | ICD-10-CM | POA: Diagnosis not present

## 2018-05-29 DIAGNOSIS — F4322 Adjustment disorder with anxiety: Secondary | ICD-10-CM | POA: Diagnosis not present

## 2018-05-29 DIAGNOSIS — H4720 Unspecified optic atrophy: Secondary | ICD-10-CM | POA: Diagnosis not present

## 2018-05-29 DIAGNOSIS — I442 Atrioventricular block, complete: Secondary | ICD-10-CM | POA: Diagnosis not present

## 2018-05-30 DIAGNOSIS — I442 Atrioventricular block, complete: Secondary | ICD-10-CM | POA: Diagnosis not present

## 2018-05-30 DIAGNOSIS — H548 Legal blindness, as defined in USA: Secondary | ICD-10-CM | POA: Diagnosis not present

## 2018-05-30 DIAGNOSIS — R441 Visual hallucinations: Secondary | ICD-10-CM | POA: Diagnosis not present

## 2018-05-30 DIAGNOSIS — H4720 Unspecified optic atrophy: Secondary | ICD-10-CM | POA: Diagnosis not present

## 2018-05-30 DIAGNOSIS — H33002 Unspecified retinal detachment with retinal break, left eye: Secondary | ICD-10-CM | POA: Diagnosis not present

## 2018-05-30 DIAGNOSIS — F4322 Adjustment disorder with anxiety: Secondary | ICD-10-CM | POA: Diagnosis not present

## 2018-05-31 DIAGNOSIS — R441 Visual hallucinations: Secondary | ICD-10-CM | POA: Diagnosis not present

## 2018-05-31 DIAGNOSIS — I442 Atrioventricular block, complete: Secondary | ICD-10-CM | POA: Diagnosis not present

## 2018-05-31 DIAGNOSIS — F4322 Adjustment disorder with anxiety: Secondary | ICD-10-CM | POA: Diagnosis not present

## 2018-05-31 DIAGNOSIS — H4720 Unspecified optic atrophy: Secondary | ICD-10-CM | POA: Diagnosis not present

## 2018-05-31 DIAGNOSIS — H548 Legal blindness, as defined in USA: Secondary | ICD-10-CM | POA: Diagnosis not present

## 2018-05-31 DIAGNOSIS — H33002 Unspecified retinal detachment with retinal break, left eye: Secondary | ICD-10-CM | POA: Diagnosis not present

## 2018-06-01 ENCOUNTER — Emergency Department (HOSPITAL_COMMUNITY)
Admission: EM | Admit: 2018-06-01 | Discharge: 2018-06-01 | Disposition: A | Discharge status: Home / Self Care | Payer: Medicare Other | Attending: Emergency Medicine | Admitting: Emergency Medicine

## 2018-06-01 ENCOUNTER — Emergency Department (HOSPITAL_COMMUNITY): Payer: Medicare Other

## 2018-06-01 ENCOUNTER — Other Ambulatory Visit: Payer: Self-pay

## 2018-06-01 ENCOUNTER — Encounter (HOSPITAL_COMMUNITY): Payer: Self-pay | Admitting: Emergency Medicine

## 2018-06-01 DIAGNOSIS — M25552 Pain in left hip: Secondary | ICD-10-CM | POA: Diagnosis not present

## 2018-06-01 DIAGNOSIS — E860 Dehydration: Secondary | ICD-10-CM | POA: Diagnosis not present

## 2018-06-01 DIAGNOSIS — S0990XA Unspecified injury of head, initial encounter: Secondary | ICD-10-CM

## 2018-06-01 DIAGNOSIS — R443 Hallucinations, unspecified: Secondary | ICD-10-CM

## 2018-06-01 DIAGNOSIS — Z7901 Long term (current) use of anticoagulants: Secondary | ICD-10-CM | POA: Insufficient documentation

## 2018-06-01 DIAGNOSIS — S8011XA Contusion of right lower leg, initial encounter: Secondary | ICD-10-CM | POA: Diagnosis not present

## 2018-06-01 DIAGNOSIS — T07XXXA Unspecified multiple injuries, initial encounter: Secondary | ICD-10-CM

## 2018-06-01 DIAGNOSIS — Y92000 Kitchen of unspecified non-institutional (private) residence as  the place of occurrence of the external cause: Secondary | ICD-10-CM | POA: Diagnosis not present

## 2018-06-01 DIAGNOSIS — Z950 Presence of cardiac pacemaker: Secondary | ICD-10-CM | POA: Insufficient documentation

## 2018-06-01 DIAGNOSIS — R52 Pain, unspecified: Secondary | ICD-10-CM | POA: Diagnosis not present

## 2018-06-01 DIAGNOSIS — E039 Hypothyroidism, unspecified: Secondary | ICD-10-CM | POA: Insufficient documentation

## 2018-06-01 DIAGNOSIS — Y9389 Activity, other specified: Secondary | ICD-10-CM | POA: Insufficient documentation

## 2018-06-01 DIAGNOSIS — Y999 Unspecified external cause status: Secondary | ICD-10-CM | POA: Insufficient documentation

## 2018-06-01 DIAGNOSIS — F4322 Adjustment disorder with anxiety: Secondary | ICD-10-CM | POA: Insufficient documentation

## 2018-06-01 DIAGNOSIS — Z79899 Other long term (current) drug therapy: Secondary | ICD-10-CM | POA: Insufficient documentation

## 2018-06-01 DIAGNOSIS — W1830XA Fall on same level, unspecified, initial encounter: Secondary | ICD-10-CM | POA: Insufficient documentation

## 2018-06-01 DIAGNOSIS — F0391 Unspecified dementia with behavioral disturbance: Secondary | ICD-10-CM | POA: Insufficient documentation

## 2018-06-01 DIAGNOSIS — I4891 Unspecified atrial fibrillation: Secondary | ICD-10-CM | POA: Diagnosis not present

## 2018-06-01 DIAGNOSIS — M545 Low back pain: Secondary | ICD-10-CM | POA: Diagnosis not present

## 2018-06-01 DIAGNOSIS — M5489 Other dorsalgia: Secondary | ICD-10-CM | POA: Diagnosis not present

## 2018-06-01 DIAGNOSIS — W19XXXA Unspecified fall, initial encounter: Secondary | ICD-10-CM

## 2018-06-01 DIAGNOSIS — Z8673 Personal history of transient ischemic attack (TIA), and cerebral infarction without residual deficits: Secondary | ICD-10-CM | POA: Diagnosis not present

## 2018-06-01 DIAGNOSIS — R410 Disorientation, unspecified: Secondary | ICD-10-CM | POA: Diagnosis not present

## 2018-06-01 LAB — CBC WITH DIFFERENTIAL/PLATELET
Abs Immature Granulocytes: 0.02 10*3/uL (ref 0.00–0.07)
Basophils Absolute: 0 10*3/uL (ref 0.0–0.1)
Basophils Relative: 0 %
Eosinophils Absolute: 0 10*3/uL (ref 0.0–0.5)
Eosinophils Relative: 1 %
HCT: 36.6 % (ref 36.0–46.0)
HEMOGLOBIN: 12.1 g/dL (ref 12.0–15.0)
Immature Granulocytes: 0 %
LYMPHS ABS: 1.2 10*3/uL (ref 0.7–4.0)
LYMPHS PCT: 22 %
MCH: 32.1 pg (ref 26.0–34.0)
MCHC: 33.1 g/dL (ref 30.0–36.0)
MCV: 97.1 fL (ref 80.0–100.0)
Monocytes Absolute: 0.5 10*3/uL (ref 0.1–1.0)
Monocytes Relative: 10 %
Neutro Abs: 3.5 10*3/uL (ref 1.7–7.7)
Neutrophils Relative %: 67 %
Platelets: 104 10*3/uL — ABNORMAL LOW (ref 150–400)
RBC: 3.77 MIL/uL — ABNORMAL LOW (ref 3.87–5.11)
RDW: 13.2 % (ref 11.5–15.5)
WBC: 5.3 10*3/uL (ref 4.0–10.5)
nRBC: 0 % (ref 0.0–0.2)

## 2018-06-01 LAB — BASIC METABOLIC PANEL
Anion gap: 9 (ref 5–15)
BUN: 32 mg/dL — ABNORMAL HIGH (ref 8–23)
CO2: 21 mmol/L — AB (ref 22–32)
Calcium: 8.4 mg/dL — ABNORMAL LOW (ref 8.9–10.3)
Chloride: 109 mmol/L (ref 98–111)
Creatinine, Ser: 1.21 mg/dL — ABNORMAL HIGH (ref 0.44–1.00)
GFR calc Af Amer: 47 mL/min — ABNORMAL LOW (ref >60)
GFR calc non Af Amer: 40 mL/min — ABNORMAL LOW (ref >60)
Glucose, Bld: 82 mg/dL (ref 70–99)
POTASSIUM: 3.4 mmol/L — AB (ref 3.5–5.1)
Sodium: 139 mmol/L (ref 135–145)

## 2018-06-01 LAB — PROTIME-INR
INR: 1.8 — AB (ref 0.8–1.2)
Prothrombin Time: 20.3 seconds — ABNORMAL HIGH (ref 11.4–15.2)

## 2018-06-01 MED ORDER — ROSUVASTATIN CALCIUM 5 MG PO TABS
10.0000 mg | ORAL_TABLET | Freq: Every day | ORAL | Status: DC
  Filled 2018-06-01: qty 2

## 2018-06-01 MED ORDER — WARFARIN SODIUM 2 MG PO TABS: 2.0000 mg | ORAL_TABLET | ORAL | Status: DC

## 2018-06-01 MED ORDER — FLUDROCORTISONE ACETATE 0.1 MG PO TABS
0.1000 mg | ORAL_TABLET | Freq: Two times a day (BID) | ORAL | Status: DC
  Filled 2018-06-01: qty 1

## 2018-06-01 MED ORDER — POTASSIUM CHLORIDE CRYS ER 20 MEQ PO TBCR
20.0000 meq | EXTENDED_RELEASE_TABLET | Freq: Every day | ORAL | Status: DC
  Filled 2018-06-01: qty 1

## 2018-06-01 NOTE — Consult Note (Signed)
TTS requested evaluation from ED consult.- ??? Patient seen resting in bed with concerns of back pain and hip pain after a fall in her kitchen. Patient is currently denying suicidal or homicidal ideation. Denies auditory or visual hallucinations. Reports she is taken her medications as directed, although she is unable to recall the name of current medications. Katrina Rodriguez is awake, alert and oriented  * 3. Psychiatry cleared, consider additional resources for in home aid/ assistance. continue with medical clearance. Support, encouragement and reassurance was provided.

## 2018-06-01 NOTE — Discharge Instructions (Addendum)
There were no serious injuries from the fall.  You may be slightly dehydrated.  Drink more fluids to prevent another fall.  For pain take Tylenol.  Call your doctor for a follow-up appointment, in 1 week.  The psychiatry team felt you are safe for discharge home with outpatient follow-up.

## 2018-06-01 NOTE — ED Provider Notes (Signed)
Patient was previously seen by Dr. Eulis Foster.  Dr. once cleared patient for a medical standpoint after her fall and patient was going to discharge home when the family expressed concerns about mental health and TTS was then called.  TTS saw the patient and did not feel she met inpatient criteria thus, she is psychiatrically cleared.  As she is now both medically and psychiatrically cleared, patient will be discharged home per the original plan by Dr. Eulis Foster.  Family will be called to accept patient home.  Patient will be discharged.   Katrina Rodriguez, [name], MD 06/01/18 2056

## 2018-06-01 NOTE — ED Notes (Signed)
Pt transported to CT ?

## 2018-06-01 NOTE — ED Triage Notes (Signed)
Per [facility]- pt picked up from home due to fall today and low back pain. Pt was sitting on stool when she lost her balance. Family reports increased confusion x3 weeks  Pt A&ox4 per baseline.  Hx of l eye blindness r eye vision impaired. Hx of a- fib, CBG 91

## 2018-06-01 NOTE — ED Notes (Signed)
Spoke to pt daughter and daughter to come pick pt up from emergency department

## 2018-06-01 NOTE — ED Notes (Signed)
Patient verbalizes understanding of discharge instructions. Opportunity for questioning and answers were provided. Armband removed by staff, pt discharged from ED.  

## 2018-06-01 NOTE — ED Provider Notes (Signed)
[facility] Provider Note   CSN: [id number] Arrival date & time: 06/01/18  1359    History   Chief Complaint Chief Complaint  Patient presents with   Fall    HPI [facility] is a 85 y.o. female.     HPI  She presents for evaluation of injuries from fall.  He states that she was in her kitchen cleaning when she felt "like everything got black," then fell.  She states she was on the floor for 4 hours, unable to get up.  Her daughter realized there was a problem, when she tried to call the patient and was unable to reach her.  The daughter called EMS who to the patient's home and found her lying on the floor.  Patient complains of pain in her low back left hip and left pelvic region.  She also states that she has pain in the back of her head and may have hurt it when she fell.  She denies recent fever, chills, rhinorrhea, cough, dysuria, nausea, vomiting or dizziness.  There are no other known modifying factors.   Past Medical History:  Diagnosis Date   Arthritis    Atrial fibrillation [facility])    History of ablation - Dr. Rayann Heman   Chest pain    Has normal coronaries per cath in 2005; negative nuclear in 2010   Chronic anemia    Complete heart block ([place]) 06/09/2013   H/O: CVA (cardiovascular accident)    Right internal capsule   Hypothyroidism 06/19/2017   Left-sided weakness 06/19/2017   Mild pulmonary hypertension ([place])    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   Orthostasis    Retinal detachment    Thrombocytopenia ([place])    TIA (transient ischemic attack)     Patient Active Problem List   Diagnosis Date Noted   Adjustment disorder with anxious mood 05/26/2018   Hypothyroidism 06/19/2017   Left-sided weakness 06/19/2017   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   Encounter for therapeutic drug monitoring 06/09/2013   Complete heart block ([place]) 06/09/2013   Syncope 04/24/2013   H/O    PAROXYSMAL ATRIAL  FIBRILLATION 02/02/2010   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      Home Medications    Prior to Admission medications   Medication Sig Start Date End Date Taking? Authorizing Provider  acetaminophen (TYLENOL) 500 MG tablet Take 500 mg by mouth every 6 (six) hours as needed for moderate pain.    [provider]  fludrocortisone (FLORINEF) 0.1 MG tablet Take 1 tablet (0.1 mg total) by mouth 2 (two) times daily. 05/21/18   [name], Peter M, MD  potassium chloride SA (K-DUR,KLOR-CON) 20 MEQ tablet Take 2 tablets (40 mEq total) by mouth daily. Patient taking differently: Take 20 mEq by mouth daily.  02/06/18   [name], Peter M, MD  rosuvastatin (CRESTOR) 10 MG tablet Take 1 tablet (10 mg total) by mouth  daily. 04/29/18 07/28/18  [name], Peter M, MD  warfarin (COUMADIN) 4 MG tablet Take 1/2 to 1 tablet daily as directed by coumadin clinic Patient taking differently: Take 2-4 mg by mouth as directed. Take 1 tablet (4 mg) every MWF and Take 0.5 tablet (2 mg) all other days. 12/28/17   [name], Peter M, MD    Family History Family History  Problem Relation Age of Onset   Heart disease Father    Stroke Father    Heart attack Father    Heart disease Brother    Heart disease Brother    Hypertension Mother    Diabetes Sister    Heart attack Sister     Social History Social History   Tobacco Use   Smoking status:  Never Smoker   Smokeless tobacco: Never Used  Substance Use Topics   Alcohol use: Never    Frequency: Never   Drug use: Never     Allergies   Amiodarone; Amoxicillin; Aspirin; Codeine; Darvocet [propoxyphene n-acetaminophen]; Demerol; Eliquis [apixaban]; Penicillins; and Xarelto [rivaroxaban]   Review of Systems Review of Systems  All other systems reviewed and are negative.    Physical Exam Updated Vital Signs BP 133/62 (BP Location: Right Arm)    Pulse 60    Temp 98.3 F (36.8 C) (Oral)    Resp 16    Ht 5\' 4"  (1.626 m)    Wt 50.3 kg    SpO2 96%    BMI 19.05 kg/m   Physical Exam Vitals signs and nursing note reviewed.  Constitutional:      General: She is not in acute distress.    Appearance: She is well-developed. She is not ill-appearing, toxic-appearing or diaphoretic.  HENT:     Head: Normocephalic.     Comments: Tender mid occiput without crepitation deformity.  No associated abrasion or laceration.    Right Ear: External ear normal.     Left Ear: External ear normal.     Nose: No congestion or rhinorrhea.     Mouth/Throat:     Mouth: Mucous membranes are moist.     Pharynx: No oropharyngeal exudate or posterior oropharyngeal erythema.  Eyes:     Extraocular Movements: Extraocular movements intact.     Conjunctiva/sclera: Conjunctivae normal.     Pupils: Pupils are equal, round, and reactive to light.  Neck:     Musculoskeletal: Normal range of motion and neck supple.     Trachea: Phonation normal.  Cardiovascular:     Rate and Rhythm: Normal rate and regular rhythm.     Heart sounds: Normal heart sounds.  Pulmonary:     Effort: Pulmonary effort is normal.     Breath sounds: Normal breath sounds.  Chest:     Chest wall: No tenderness.  Abdominal:     General: There is no distension.     Palpations: Abdomen is soft. There is no mass.     Tenderness: There is no abdominal tenderness.     Hernia: No hernia is present.  Musculoskeletal:     Comments:  Guards against passive movement of the left hip.  No deformity of the left hip.  Mild lumbar tenderness to palpation.  She is sitting up during examination.  Normal motion arms and right leg.  Skin:    General: Skin is warm and dry.     Coloration: Skin is not jaundiced or pale.  Neurological:     Mental Status: She is alert and oriented to person, place, and time.  Cranial Nerves: No cranial nerve deficit.     Sensory: No sensory deficit.     Motor: No weakness or abnormal muscle tone.     Coordination: Coordination normal.  Psychiatric:        Behavior: Behavior normal.        Thought Content: Thought content normal.        Judgment: Judgment normal.      ED Treatments / Results  Labs (all labs ordered are listed, but only abnormal results are displayed) Labs Reviewed  BASIC METABOLIC PANEL - Abnormal; Notable for the following components:      Result Value   Potassium 3.4 (*)    CO2 21 (*)    BUN 32 (*)    Creatinine, Ser 1.21 (*)    Calcium 8.4 (*)    GFR calc non Af Amer 40 (*)    GFR calc Af Amer 47 (*)    All other components within normal limits  CBC WITH DIFFERENTIAL/PLATELET - Abnormal; Notable for the following components:   RBC 3.77 (*)    Platelets 104 (*)    All other components within normal limits  URINALYSIS, ROUTINE W REFLEX MICROSCOPIC    EKG EKG Interpretation  Date/Time:  Saturday June 01 2018 15:19:36 EDT Ventricular Rate:  66 PR Interval:    QRS Duration: 109 QT Interval:  577 QTC Calculation: 605 R Axis:   29 Text Interpretation:  Sinus rhythm Atrial premature complex Prolonged QT interval Since last tracing QT has lengthened Confirmed by Daleen Bo [phone]) on 06/01/2018 5:06:45 PM   Radiology Dg Pelvis 1-2 Views  Result Date: 06/01/2018 CLINICAL DATA:  Patient fell.  Pain. EXAM: PELVIS - 1-2 VIEW COMPARISON:  None. FINDINGS: The left hip is abducted during the study limiting evaluation. Within this limitation, no fracture or  dislocation identified in either hip. The pelvic bones are unremarkable. IMPRESSION: Mildly limited study due to positioning of the left hip. Within this limitation, no fracture or traumatic abnormality noted. Electronically Signed   By: Dorise Bullion III M.D   On: 06/01/2018 14:55   Ct Head Wo Contrast  Result Date: 06/01/2018 CLINICAL DATA:  Fall today striking head. EXAM: CT HEAD WITHOUT CONTRAST CT CERVICAL SPINE WITHOUT CONTRAST TECHNIQUE: Multidetector CT imaging of the head and cervical spine was performed following the standard protocol without intravenous contrast. Multiplanar CT image reconstructions of the cervical spine were also generated. COMPARISON:  05/12/2018 FINDINGS: CT HEAD FINDINGS Brain: The brainstem, cerebellum, cerebral peduncles, thalami, basal ganglia, basilar cisterns, and ventricular system appear within normal limits. No intracranial hemorrhage, mass lesion, or acute CVA. Vascular: There is atherosclerotic calcification of the cavernous carotid arteries bilaterally. Skull: Unremarkable Sinuses/Orbits: High-density vitreus of the left globe is once again noted. Other: No supplemental non-categorized findings. CT CERVICAL SPINE FINDINGS Alignment: No vertebral subluxation is observed. Skull base and vertebrae: No cervical spine fracture is identified. Soft tissues and spinal canal: Left greater than right common carotid artery atherosclerotic calcification. Disc levels: There is bony foraminal narrowing on the right at C3-4, C4-5, and C5-6; and on the left at C3-4 due to uncinate and facet spurring. Loss of disc height at all levels between C3 and C7. Upper chest: Mild biapical pleuroparenchymal scarring. Other: No supplemental non-categorized findings. IMPRESSION: 1. No acute intracranial findings or acute cervical spine findings. 2. Atherosclerosis. 3. Foraminal narrowing at C3-4, C4-5, and C5-6 due to chronic bony spurring. Electronically Signed   By: Van Clines M.D.    On: 06/01/2018 16:35  Ct Cervical Spine Wo Contrast  Result Date: 06/01/2018 CLINICAL DATA:  Fall today striking head. EXAM: CT HEAD WITHOUT CONTRAST CT CERVICAL SPINE WITHOUT CONTRAST TECHNIQUE: Multidetector CT imaging of the head and cervical spine was performed following the standard protocol without intravenous contrast. Multiplanar CT image reconstructions of the cervical spine were also generated. COMPARISON:  05/12/2018 FINDINGS: CT HEAD FINDINGS Brain: The brainstem, cerebellum, cerebral peduncles, thalami, basal ganglia, basilar cisterns, and ventricular system appear within normal limits. No intracranial hemorrhage, mass lesion, or acute CVA. Vascular: There is atherosclerotic calcification of the cavernous carotid arteries bilaterally. Skull: Unremarkable Sinuses/Orbits: High-density vitreus of the left globe is once again noted. Other: No supplemental non-categorized findings. CT CERVICAL SPINE FINDINGS Alignment: No vertebral subluxation is observed. Skull base and vertebrae: No cervical spine fracture is identified. Soft tissues and spinal canal: Left greater than right common carotid artery atherosclerotic calcification. Disc levels: There is bony foraminal narrowing on the right at C3-4, C4-5, and C5-6; and on the left at C3-4 due to uncinate and facet spurring. Loss of disc height at all levels between C3 and C7. Upper chest: Mild biapical pleuroparenchymal scarring. Other: No supplemental non-categorized findings. IMPRESSION: 1. No acute intracranial findings or acute cervical spine findings. 2. Atherosclerosis. 3. Foraminal narrowing at C3-4, C4-5, and C5-6 due to chronic bony spurring. Electronically Signed   By: Van Clines M.D.   On: 06/01/2018 16:35    Procedures Procedures (including critical care time)  Medications Ordered in ED Medications - No data to display   Initial Impression / Assessment and Plan / ED Course  I have reviewed the triage vital signs and the  nursing notes.  Pertinent labs & imaging results that were available during my care of the patient were reviewed by me and considered in my medical decision making (see chart for details).  Clinical Course as of May 31 1720  Sat Jun 01, 2018  1704 Normal except platelets low 104  CBC with Differential(!) [EW]  1705 Normal except potassium low, CO2 low, BUN high, creatinine high, calcium low, GFR low  Basic metabolic panel(!) [EW]  8250 No acute intracranial abnormality, images reviewed by me  CT Head Wo Contrast [EW]  1705 Fracture or dislocation, degenerative change present, images reviewed by me  CT Cervical Spine Wo Contrast [EW]  5397 No fracture, image reviewed by me  DG Pelvis 1-2 Views [EW]    Clinical Course User Index [EW] Daleen Bo, MD        Patient Vitals for the past 24 hrs:  BP Temp Temp src Pulse Resp SpO2 Height Weight  06/01/18 1717 133/62 -- -- 60 16 96 % -- --  06/01/18 1410 139/82 98.3 F (36.8 C) Oral 71 16 100 % -- --  06/01/18 1406 -- -- -- -- -- -- 5\' 4"  (1.626 m) 50.3 kg    5:06 PM Reevaluation with update and discussion. After initial assessment and treatment, an updated evaluation reveals no change in clinical status.  Findings discussed with patient's daughter on the phone, her name is [name]. She states that patient remains in respiratory self and apparently has another done in her home somewhere.  Patient continues to hallucinate and see people and things that are not really there.  [name] believes that the patient will harm herself if she goes home again.  Currently she has been referred to a neurologist but not been able to see that doctor yet, for unknown reasons.  The appointment is scheduled for May 2020.  Patient was recently seen by psychiatry and evaluated in the ED twice in the last month.  The patient is unaware of these events.  While asked TTS to evaluate the patient, once more. Daleen Bo   Medical Decision Making: Fall,  possibly secondary to presyncope.  No serious injuries.  Screening indicates mild renal insufficiency likely dehydration.  Doubt serious bacterial infection, metabolic instability or impending vascular collapse.  CRITICAL CARE-no Performed by: Daleen Bo  Nursing Notes Reviewed/ Care Coordinated Applicable Imaging Reviewed Interpretation of Laboratory Data incorporated into ED treatment   TTS Consult  Plan-as per TTS in conjunction with oncoming provider team  Final Clinical Impressions(s) / ED Diagnoses   Final diagnoses:  Fall, initial encounter  Injury of head, initial encounter  Contusion, multiple sites  Hallucination  Dehydration    ED Discharge Orders    None       Daleen Bo, MD 06/01/18 1723

## 2018-06-01 NOTE — BH Assessment (Signed)
Tele Assessment Note   Patient Name: ARRYN Rodriguez MRN: [id number] Referring Physician: Eulis Foster Location of Patient: [facility] Location of Provider: [place] Department  Per EDP Report: Patient presents for evaluation of injuries from fall.  Shee states that she was in her kitchen cleaning when she felt "like everything got black," then fell.  She states she was on the floor for 4 hours, unable to get up.  Her daughter realized there was a problem, when she tried to call the patient and was unable to reach her.  The daughter called EMS who to the patient's home and found her lying on the floor.  After initial assessment and treatment, an updated evaluation reveals no change in clinical status.  Findings discussed with patient's daughter on the phone, her name is [name].  Patient continues to hallucinate and see people and things that are not really there.  [name] believes that the patient will harm herself if she goes home again.  Currently she has been referred to a neurologist but not been able to see that doctor yet, for unknown reasons.  The appointment is scheduled for May 2020.  Patient was recently seen by psychiatry and evaluated in the ED twice in the last month.  The patient is unaware of these events.   TTS Assessment: When asked what brought her to the ED, patient stated, "I fell on the floor and couldn't get up and I was down for four and a half hours."  I informed patient that her daughter feels like she is not safe at home.  Patient stated, "that is tough, I am going home."  Patient is alert and oriented to time, person, place and situation.  Patient states that she has no history of mental illness.  She states that she has never been suicidal or homicidal in the past. Patient states that she is not currently depressed.  When questions about her hallucinations, patient stated that she doe hallucinate at times, but states that it is due to a neurological problem and states that she  has an appointment to see a neurologist.  She states that normally she does not see or hear things.  Patient has no history of drug or alcohol use, she denies any history of abuse or self-mutilation.   TTS contacted patient's daughter, [name], [phone], for collateral information. Daughter states that patient has had some confusion and states that she has been seeing people who are not there and she has had her gun out to shoot the people that she was seeing.  Daughter states that patient has also been calling the police because she has been seeing people.  When asked if patient has any history of mental illness, es, "not until 3 weeks ago."  Explained to daughter that her mother most likely has dementia and psychiatric placement is not what her mother needs.  Informed her that her mother does not need to live alone and would possibly benefit from memory care or nursing home placement.  Daughter was in agreement and has medical power of attorney, but states that her mother will not agree to placement.  Explained to her the competency process to gain control over her mother's decisions. Daughter states that she is trying to contact her neurologist on call to see if she could be seen tonight in the ED.  Patient is able to contract for safety, she is lucid and alert.  She is currently not experiencing any visual hallucinations.    Diagnosis:  F03.91 Unspecified dementia with behavioral disturbance  Past Medical History:  Past Medical History:  Diagnosis Date  . Arthritis   . Atrial fibrillation (HCC)    History of ablation - Dr. Rayann Heman  . Chest pain    Has normal coronaries per cath in 2005; negative nuclear in 2010  . Chronic anemia   . Complete heart block ([place]) 06/09/2013  . H/O: CVA (cardiovascular accident)    Right internal capsule  . Hypothyroidism 06/19/2017  . Left-sided weakness 06/19/2017  . Mild pulmonary hypertension ([place])   . 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  Orthostasis   . Retinal detachment   . Thrombocytopenia ([place])   . TIA (transient ischemic attack)     Past Surgical History:  Procedure Laterality Date  . CARDIAC CATHETERIZATION    . CARDIAC PACEMAKER PLACEMENT    . PERMANENT PACEMAKER GENERATOR CHANGE N/A 06/12/2012   Procedure: PERMANENT PACEMAKER GENERATOR CHANGE;  Surgeon: Evans Lance, MD;  Location: [facility] CATH LAB;  Service: Cardiovascular;  Laterality: N/A;  . RETINAL DETACHMENT SURGERY      Family History:  Family History  Problem Relation Age of Onset  . Heart disease Father   . Stroke Father   . Heart attack Father   . Heart disease Brother   . Heart disease Brother   . Hypertension Mother   . Diabetes Sister   . Heart attack Sister     Social History:  reports that she has never smoked. She has never used smokeless tobacco. She reports that she does not drink alcohol or use drugs.  Additional Social History:  Alcohol / Drug Use Pain Medications: See MAR Prescriptions: See MAR Over the Counter: See MAR History of alcohol / drug use?: No history of alcohol / drug abuse Longest period of sobriety (when/how long): N/A  CIWA: CIWA-Ar BP: 133/62 Pulse Rate: 60 COWS:    Allergies:  Allergies  Allergen Reactions  . Amiodarone Other (See Comments)    Caused thyroid problems  . Amoxicillin Hives  . Aspirin Other (See Comments)    On tikosyn and warfarin  . Codeine Other (See Comments)    Causes BP to drop  . Darvocet [Propoxyphene N-Acetaminophen] Hives  . Demerol Itching  . Eliquis [Apixaban] Nausea Only  . Penicillins Hives  . Xarelto [Rivaroxaban] Rash    Home Medications: (Not in a hospital admission)   OB/GYN Status:  No LMP recorded. Patient is postmenopausal.  General Assessment Data Location of Assessment: [facility] ED TTS Assessment: In system Is this a Tele or Face-to-Face Assessment?: Tele Assessment Is this an Initial Assessment or a Re-assessment for this encounter?: Initial Assessment Patient  Accompanied by:: N/A Language Other than English: No Living Arrangements: Other (Comment)(lives independently in her own home) What gender do you identify as?: Female Marital status: Widowed Living Arrangements: Alone Can pt return to current living arrangement?: Yes Admission Status: Voluntary Is patient capable of signing voluntary admission?: Yes Referral Source: Self/Family/Friend Insurance type: Medicare     Crisis Care Plan Living Arrangements: Alone Legal Guardian: Other:(self) Name of Psychiatrist: none Name of Therapist: none  Education Status Is patient currently in school?: No Is the patient employed, unemployed or receiving disability?: Unemployed, Receiving disability income(retired)  Risk to self with the past 6 months Suicidal Ideation: No Has patient been a risk to self within the past 6 months prior to admission? : No Suicidal Intent: No Has patient had any suicidal intent within the past 6 months prior to admission? :  No Is patient at risk for suicide?: No Suicidal Plan?: No Has patient had any suicidal plan within the past 6 months prior to admission? : No Access to Means: No What has been your use of drugs/alcohol within the last 12 months?: none Previous Attempts/Gestures: No How many times?: 0 Other Self Harm Risks: (NA) Triggers for Past Attempts: None known Intentional Self Injurious Behavior: None Family Suicide History: No Recent stressful life event(s): Other (Comment) Persecutory voices/beliefs?: No Depression: No Substance abuse history and/or treatment for substance abuse?: No Suicide prevention information given to non-admitted patients: Not applicable  Risk to Others within the past 6 months Homicidal Ideation: No Does patient have any lifetime risk of violence toward others beyond the six months prior to admission? : No Thoughts of Harm to Others: No Current Homicidal Intent: No Current Homicidal Plan: No Access to Homicidal Means:  No Identified Victim: none History of harm to others?: No Assessment of Violence: None Noted Violent Behavior Description: n/a Does patient have access to weapons?: No Criminal Charges Pending?: No Does patient have a court date: No Is patient on probation?: No  Psychosis Hallucinations: None noted Delusions: None noted  Mental Status Report Appearance/Hygiene: Unremarkable Eye Contact: Good Motor Activity: Unremarkable Speech: Logical/coherent Level of Consciousness: Alert Mood: Pleasant Affect: Appropriate to circumstance Anxiety Level: None Thought Processes: Coherent, Relevant Judgement: Unimpaired Orientation: Person, Place, Time, Situation Obsessive Compulsive Thoughts/Behaviors: None  Cognitive Functioning Concentration: Normal Memory: Recent Intact, Remote Intact Is patient IDD: No Insight: Good Impulse Control: Good Appetite: Good Have you had any weight changes? : No Change Sleep: Decreased Total Hours of Sleep: 4 Vegetative Symptoms: None  ADLScreening [facility]) Patient's cognitive ability adequate to safely complete daily activities?: Yes Patient able to express need for assistance with ADLs?: Yes Independently performs ADLs?: Yes (appropriate for developmental age)  Prior Inpatient Therapy Prior Inpatient Therapy: No  Prior Outpatient Therapy Prior Outpatient Therapy: No Does patient have an ACCT team?: No Does patient have Intensive In-House Services?  : No Does patient have [facility] services? : No Does patient have P4CC services?: No  ADL Screening (condition at time of admission) Patient's cognitive ability adequate to safely complete daily activities?: Yes Is the patient deaf or have difficulty hearing?: No Does the patient have difficulty seeing, even when wearing glasses/contacts?: No Does the patient have difficulty concentrating, remembering, or making decisions?: No Patient able to express need for assistance with ADLs?:  Yes Does the patient have difficulty dressing or bathing?: No Independently performs ADLs?: Yes (appropriate for developmental age) Does the patient have difficulty walking or climbing stairs?: No Weakness of Legs: None Weakness of Arms/Hands: None  Home Assistive Devices/Equipment Home Assistive Devices/Equipment: None  Therapy Consults (therapy consults require a physician order) PT Evaluation Needed: No OT Evalulation Needed: No SLP Evaluation Needed: No Abuse/Neglect Assessment (Assessment to be complete while patient is alone) Abuse/Neglect Assessment Can Be Completed: Yes Physical Abuse: Denies Verbal Abuse: Denies Sexual Abuse: Denies Self-Neglect: Denies Values / Beliefs Spiritual Requests During Hospitalization: None Consults Spiritual Care Consult Needed: No Social Work Consult Needed: No Regulatory affairs officer (For Healthcare) Does Patient Have a Medical Advance Directive?: No Would patient like information on creating a medical advance directive?: No - Patient declined Nutrition Screen- [facility] Adult/[facility]/AP Has the patient recently lost weight without trying?: No Has the patient been eating poorly because of a decreased appetite?: No Malnutrition Screening Tool Score: 0        Disposition: Per Ricky Ala, patient does not  meet inpatient admission criteria  Disposition Initial Assessment Completed for this Encounter: Yes Patient referred to: Other (Comment)(has appointment with a neurologist)  This service was provided via telemedicine using a 2-way, interactive audio and video technology.  Names of all persons participating in this telemedicine service and their role in this encounter. Name: Katrina Rodriguez Role: patient  Name: Donshay Lupinski Role: TTS  Name:  Role:   Name:  Role:     Reatha Armour 06/01/2018 6:23 PM

## 2018-06-01 NOTE — ED Notes (Signed)
TTS in progress 

## 2018-06-03 ENCOUNTER — Telehealth: Payer: Self-pay | Admitting: Adult Health

## 2018-06-03 NOTE — Telephone Encounter (Signed)
I contacted the pt's daughter betty, ok per DRP and advised we could complete a telephone visit.  Pt daughter  understands that although there may be some limitations with this type of visit, we will take all precautions to reduce any security or privacy concerns.  Pt's daughter understands that this will be treated like an in office visit and we will file with pt's insurance, and there may be a patient responsible charge related to this service.   Best call back # is the mobile # listed on file.

## 2018-06-03 NOTE — Telephone Encounter (Signed)
Pt daughter(on DPR) has called to ask about pt being seen a lot sooner due to an increase in hallucinations.  Daughter states pt informing her of lots of people in her house.  Daughter states one time pt had police to her house and she had a gun.  Please call

## 2018-06-03 NOTE — Telephone Encounter (Signed)
Per review of epic notes, she has been recently evaluated in the ED x3 for these hallucination-like episodes with psychiatry diagnosis of adjustment disorder but does not appear to have any further recommendations.  She was last seen in this office in 2019 after TIA and was stable at that time therefore recommended to follow-up as needed for stroke/TIA concerns.  Difficult to determine whether these hallucinations are secondary to dementia, current medications, neurological or psychological.  She does have an appointment scheduled with Dr. [name] in 07/2018.  If sooner available visit, potentially can be evaluated sooner through telemedicine but this will be at his discretion.  She should also ensure following up with PCP as recommended at hospital discharge along with potential need of psychiatric referral which can be placed by PCP.

## 2018-06-04 DIAGNOSIS — I442 Atrioventricular block, complete: Secondary | ICD-10-CM | POA: Diagnosis not present

## 2018-06-04 DIAGNOSIS — H33002 Unspecified retinal detachment with retinal break, left eye: Secondary | ICD-10-CM | POA: Diagnosis not present

## 2018-06-04 DIAGNOSIS — R441 Visual hallucinations: Secondary | ICD-10-CM | POA: Diagnosis not present

## 2018-06-04 DIAGNOSIS — F4322 Adjustment disorder with anxiety: Secondary | ICD-10-CM | POA: Diagnosis not present

## 2018-06-04 DIAGNOSIS — H4720 Unspecified optic atrophy: Secondary | ICD-10-CM | POA: Diagnosis not present

## 2018-06-04 DIAGNOSIS — H548 Legal blindness, as defined in USA: Secondary | ICD-10-CM | POA: Diagnosis not present

## 2018-06-05 DIAGNOSIS — F4322 Adjustment disorder with anxiety: Secondary | ICD-10-CM | POA: Diagnosis not present

## 2018-06-05 DIAGNOSIS — R441 Visual hallucinations: Secondary | ICD-10-CM | POA: Diagnosis not present

## 2018-06-05 DIAGNOSIS — H548 Legal blindness, as defined in USA: Secondary | ICD-10-CM | POA: Diagnosis not present

## 2018-06-05 DIAGNOSIS — I442 Atrioventricular block, complete: Secondary | ICD-10-CM | POA: Diagnosis not present

## 2018-06-05 DIAGNOSIS — H33002 Unspecified retinal detachment with retinal break, left eye: Secondary | ICD-10-CM | POA: Diagnosis not present

## 2018-06-05 DIAGNOSIS — H4720 Unspecified optic atrophy: Secondary | ICD-10-CM | POA: Diagnosis not present

## 2018-06-06 ENCOUNTER — Other Ambulatory Visit: Payer: Self-pay

## 2018-06-06 ENCOUNTER — Ambulatory Visit (INDEPENDENT_AMBULATORY_CARE_PROVIDER_SITE_OTHER): Payer: Medicare Other | Admitting: Neurology

## 2018-06-06 ENCOUNTER — Encounter: Payer: Self-pay | Admitting: Neurology

## 2018-06-06 DIAGNOSIS — I442 Atrioventricular block, complete: Secondary | ICD-10-CM | POA: Diagnosis not present

## 2018-06-06 DIAGNOSIS — H4720 Unspecified optic atrophy: Secondary | ICD-10-CM | POA: Diagnosis not present

## 2018-06-06 DIAGNOSIS — H548 Legal blindness, as defined in USA: Secondary | ICD-10-CM | POA: Diagnosis not present

## 2018-06-06 DIAGNOSIS — R443 Hallucinations, unspecified: Secondary | ICD-10-CM | POA: Diagnosis not present

## 2018-06-06 DIAGNOSIS — R441 Visual hallucinations: Secondary | ICD-10-CM | POA: Diagnosis not present

## 2018-06-06 DIAGNOSIS — H33002 Unspecified retinal detachment with retinal break, left eye: Secondary | ICD-10-CM | POA: Diagnosis not present

## 2018-06-06 DIAGNOSIS — F4322 Adjustment disorder with anxiety: Secondary | ICD-10-CM | POA: Diagnosis not present

## 2018-06-06 MED ORDER — QUETIAPINE FUMARATE 25 MG PO TABS: ORAL_TABLET | ORAL | 2 refills | Status: DC

## 2018-06-06 NOTE — Progress Notes (Signed)
Virtual Visit via Telephone Note  I connected with Katrina Rodriguez on 06/06/18 at 11:00 AM EDT by telephone and verified that I am speaking with the correct person using two identifiers.   I discussed the limitations, risks, security and privacy concerns of performing an evaluation and management service by telephone and the availability of in person appointments. I also discussed with the patient that there may be a patient responsible charge related to this service. The patient expressed understanding and agreed to proceed.   History of Present Illness: Katrina Rodriguez is an 85 year old right-handed white female with a history of visual impairment with essential blindness.  She also has a history of vitamin B12 deficiency, she is not currently on B12 supplementation.  The patient lives alone at home, she gets some assistance.  She is managing her own medications.  She does not operate a motor vehicle.  She has a history of cerebrovascular disease with left-sided weakness.  In the month of March, she has been to the emergency room on 3 occasions, on 8 March, 21 March, and on 28 March.  She has had hallucinations, confusion, decline in memory.  The patient is not sleeping well in the evening.  She at one point had a gun, she had called the police.  She was taken to the hospital and for some reason she was not felt to be a candidate for psychiatric admission.  The patient has delusional thinking, she believes that people inside her house doing drugs.  She has called the police on 4 occasions.  She will see people in the house as well.  She will talk to people in the house.  The patient is agitated at times.  The hallucinations may occur at any point during the day.  She has developed a short-term memory issue, she is not oriented to date but she does know where she is and she is able remember her name.  She has not had any new obvious numbness, weakness, speech change, or balance issues.  She has developed a  tremor of the right arm that is a resting tremor.  The daughter has noticed this over the last month.   Observations/Objective: Interview over the telephone with the daughter was done.  Assessment and Plan: 1.  Visual impairment, blindness  2.  Hallucinations, delusional thinking  3.  Memory impairment  4.  Right arm resting tremor, possible Lewy body dementia  The patient has had a change in her cognitive level, she is likely developing a dementia and may be developing parkinsonian features.  She could have a Lewy body dementia.  The family will be pursuing guardianship, she needs to be in a monitored environment.  She is a risk to herself and to others.  Seroquel will be added taking 25 mg in the evening and 12.5 mg in the morning.  A prescription was sent in.  She will follow-up here on 05 Jul 2018.  Follow Up Instructions: Appointment with me on Jul 05, 2018.   I discussed the assessment and treatment plan with the patient. The patient was provided an opportunity to ask questions and all were answered. The patient agreed with the plan and demonstrated an understanding of the instructions.   The patient was advised to call back or seek an in-person evaluation if the symptoms worsen or if the condition fails to improve as anticipated.  I provided 25 minutes of non-face-to-face time during this encounter.   Kathrynn Ducking, MD

## 2018-06-10 DIAGNOSIS — R441 Visual hallucinations: Secondary | ICD-10-CM | POA: Diagnosis not present

## 2018-06-10 DIAGNOSIS — F4322 Adjustment disorder with anxiety: Secondary | ICD-10-CM | POA: Diagnosis not present

## 2018-06-10 DIAGNOSIS — H548 Legal blindness, as defined in USA: Secondary | ICD-10-CM | POA: Diagnosis not present

## 2018-06-10 DIAGNOSIS — H33002 Unspecified retinal detachment with retinal break, left eye: Secondary | ICD-10-CM | POA: Diagnosis not present

## 2018-06-10 DIAGNOSIS — I442 Atrioventricular block, complete: Secondary | ICD-10-CM | POA: Diagnosis not present

## 2018-06-10 DIAGNOSIS — H4720 Unspecified optic atrophy: Secondary | ICD-10-CM | POA: Diagnosis not present

## 2018-06-12 DIAGNOSIS — H4720 Unspecified optic atrophy: Secondary | ICD-10-CM | POA: Diagnosis not present

## 2018-06-12 DIAGNOSIS — H33002 Unspecified retinal detachment with retinal break, left eye: Secondary | ICD-10-CM | POA: Diagnosis not present

## 2018-06-12 DIAGNOSIS — I442 Atrioventricular block, complete: Secondary | ICD-10-CM | POA: Diagnosis not present

## 2018-06-12 DIAGNOSIS — F4322 Adjustment disorder with anxiety: Secondary | ICD-10-CM | POA: Diagnosis not present

## 2018-06-12 DIAGNOSIS — R441 Visual hallucinations: Secondary | ICD-10-CM | POA: Diagnosis not present

## 2018-06-12 DIAGNOSIS — H548 Legal blindness, as defined in USA: Secondary | ICD-10-CM | POA: Diagnosis not present

## 2018-06-13 DIAGNOSIS — H548 Legal blindness, as defined in USA: Secondary | ICD-10-CM | POA: Diagnosis not present

## 2018-06-13 DIAGNOSIS — R441 Visual hallucinations: Secondary | ICD-10-CM | POA: Diagnosis not present

## 2018-06-13 DIAGNOSIS — H4720 Unspecified optic atrophy: Secondary | ICD-10-CM | POA: Diagnosis not present

## 2018-06-13 DIAGNOSIS — I442 Atrioventricular block, complete: Secondary | ICD-10-CM | POA: Diagnosis not present

## 2018-06-13 DIAGNOSIS — F4322 Adjustment disorder with anxiety: Secondary | ICD-10-CM | POA: Diagnosis not present

## 2018-06-13 DIAGNOSIS — H33002 Unspecified retinal detachment with retinal break, left eye: Secondary | ICD-10-CM | POA: Diagnosis not present

## 2018-06-14 DIAGNOSIS — H4720 Unspecified optic atrophy: Secondary | ICD-10-CM | POA: Diagnosis not present

## 2018-06-14 DIAGNOSIS — F4322 Adjustment disorder with anxiety: Secondary | ICD-10-CM | POA: Diagnosis not present

## 2018-06-14 DIAGNOSIS — I442 Atrioventricular block, complete: Secondary | ICD-10-CM | POA: Diagnosis not present

## 2018-06-14 DIAGNOSIS — H548 Legal blindness, as defined in USA: Secondary | ICD-10-CM | POA: Diagnosis not present

## 2018-06-14 DIAGNOSIS — R441 Visual hallucinations: Secondary | ICD-10-CM | POA: Diagnosis not present

## 2018-06-14 DIAGNOSIS — H33002 Unspecified retinal detachment with retinal break, left eye: Secondary | ICD-10-CM | POA: Diagnosis not present

## 2018-06-17 DIAGNOSIS — H548 Legal blindness, as defined in USA: Secondary | ICD-10-CM | POA: Diagnosis not present

## 2018-06-17 DIAGNOSIS — H4720 Unspecified optic atrophy: Secondary | ICD-10-CM | POA: Diagnosis not present

## 2018-06-17 DIAGNOSIS — F4322 Adjustment disorder with anxiety: Secondary | ICD-10-CM | POA: Diagnosis not present

## 2018-06-17 DIAGNOSIS — I442 Atrioventricular block, complete: Secondary | ICD-10-CM | POA: Diagnosis not present

## 2018-06-17 DIAGNOSIS — R441 Visual hallucinations: Secondary | ICD-10-CM | POA: Diagnosis not present

## 2018-06-17 DIAGNOSIS — H33002 Unspecified retinal detachment with retinal break, left eye: Secondary | ICD-10-CM | POA: Diagnosis not present

## 2018-06-19 DIAGNOSIS — I442 Atrioventricular block, complete: Secondary | ICD-10-CM | POA: Diagnosis not present

## 2018-06-19 DIAGNOSIS — R441 Visual hallucinations: Secondary | ICD-10-CM | POA: Diagnosis not present

## 2018-06-19 DIAGNOSIS — H33002 Unspecified retinal detachment with retinal break, left eye: Secondary | ICD-10-CM | POA: Diagnosis not present

## 2018-06-19 DIAGNOSIS — H4720 Unspecified optic atrophy: Secondary | ICD-10-CM | POA: Diagnosis not present

## 2018-06-19 DIAGNOSIS — F4322 Adjustment disorder with anxiety: Secondary | ICD-10-CM | POA: Diagnosis not present

## 2018-06-19 DIAGNOSIS — H548 Legal blindness, as defined in USA: Secondary | ICD-10-CM | POA: Diagnosis not present

## 2018-06-20 DIAGNOSIS — H33002 Unspecified retinal detachment with retinal break, left eye: Secondary | ICD-10-CM | POA: Diagnosis not present

## 2018-06-20 DIAGNOSIS — H4720 Unspecified optic atrophy: Secondary | ICD-10-CM | POA: Diagnosis not present

## 2018-06-20 DIAGNOSIS — I442 Atrioventricular block, complete: Secondary | ICD-10-CM | POA: Diagnosis not present

## 2018-06-20 DIAGNOSIS — H548 Legal blindness, as defined in USA: Secondary | ICD-10-CM | POA: Diagnosis not present

## 2018-06-20 DIAGNOSIS — R441 Visual hallucinations: Secondary | ICD-10-CM | POA: Diagnosis not present

## 2018-06-20 DIAGNOSIS — F4322 Adjustment disorder with anxiety: Secondary | ICD-10-CM | POA: Diagnosis not present

## 2018-06-25 DIAGNOSIS — H548 Legal blindness, as defined in USA: Secondary | ICD-10-CM | POA: Diagnosis not present

## 2018-06-25 DIAGNOSIS — H33002 Unspecified retinal detachment with retinal break, left eye: Secondary | ICD-10-CM | POA: Diagnosis not present

## 2018-06-25 DIAGNOSIS — R441 Visual hallucinations: Secondary | ICD-10-CM | POA: Diagnosis not present

## 2018-06-25 DIAGNOSIS — F4322 Adjustment disorder with anxiety: Secondary | ICD-10-CM | POA: Diagnosis not present

## 2018-06-25 DIAGNOSIS — I442 Atrioventricular block, complete: Secondary | ICD-10-CM | POA: Diagnosis not present

## 2018-06-25 DIAGNOSIS — H4720 Unspecified optic atrophy: Secondary | ICD-10-CM | POA: Diagnosis not present

## 2018-06-27 DIAGNOSIS — H33002 Unspecified retinal detachment with retinal break, left eye: Secondary | ICD-10-CM | POA: Diagnosis not present

## 2018-06-27 DIAGNOSIS — M150 Primary generalized (osteo)arthritis: Secondary | ICD-10-CM | POA: Diagnosis not present

## 2018-06-27 DIAGNOSIS — Z86718 Personal history of other venous thrombosis and embolism: Secondary | ICD-10-CM | POA: Diagnosis not present

## 2018-06-27 DIAGNOSIS — Z950 Presence of cardiac pacemaker: Secondary | ICD-10-CM | POA: Diagnosis not present

## 2018-06-27 DIAGNOSIS — R441 Visual hallucinations: Secondary | ICD-10-CM | POA: Diagnosis not present

## 2018-06-27 DIAGNOSIS — I4891 Unspecified atrial fibrillation: Secondary | ICD-10-CM | POA: Diagnosis not present

## 2018-06-27 DIAGNOSIS — D649 Anemia, unspecified: Secondary | ICD-10-CM | POA: Diagnosis not present

## 2018-06-27 DIAGNOSIS — I69354 Hemiplegia and hemiparesis following cerebral infarction affecting left non-dominant side: Secondary | ICD-10-CM | POA: Diagnosis not present

## 2018-06-27 DIAGNOSIS — D696 Thrombocytopenia, unspecified: Secondary | ICD-10-CM | POA: Diagnosis not present

## 2018-06-27 DIAGNOSIS — F4322 Adjustment disorder with anxiety: Secondary | ICD-10-CM | POA: Diagnosis not present

## 2018-06-27 DIAGNOSIS — I700 Atherosclerosis of aorta: Secondary | ICD-10-CM | POA: Diagnosis not present

## 2018-06-27 DIAGNOSIS — Z602 Problems related to living alone: Secondary | ICD-10-CM | POA: Diagnosis not present

## 2018-06-27 DIAGNOSIS — Z9181 History of falling: Secondary | ICD-10-CM | POA: Diagnosis not present

## 2018-06-27 DIAGNOSIS — E039 Hypothyroidism, unspecified: Secondary | ICD-10-CM | POA: Diagnosis not present

## 2018-06-27 DIAGNOSIS — H548 Legal blindness, as defined in USA: Secondary | ICD-10-CM | POA: Diagnosis not present

## 2018-06-27 DIAGNOSIS — Z7901 Long term (current) use of anticoagulants: Secondary | ICD-10-CM | POA: Diagnosis not present

## 2018-06-27 DIAGNOSIS — H4720 Unspecified optic atrophy: Secondary | ICD-10-CM | POA: Diagnosis not present

## 2018-06-27 DIAGNOSIS — I272 Pulmonary hypertension, unspecified: Secondary | ICD-10-CM | POA: Diagnosis not present

## 2018-06-27 DIAGNOSIS — H9190 Unspecified hearing loss, unspecified ear: Secondary | ICD-10-CM | POA: Diagnosis not present

## 2018-06-27 DIAGNOSIS — I442 Atrioventricular block, complete: Secondary | ICD-10-CM | POA: Diagnosis not present

## 2018-07-01 ENCOUNTER — Encounter (HOSPITAL_COMMUNITY): Payer: Self-pay

## 2018-07-01 ENCOUNTER — Emergency Department (HOSPITAL_COMMUNITY): Payer: Medicare Other

## 2018-07-01 ENCOUNTER — Other Ambulatory Visit: Payer: Self-pay

## 2018-07-01 ENCOUNTER — Observation Stay (HOSPITAL_COMMUNITY)
Admission: EM | Admit: 2018-07-01 | Discharge: 2018-07-03 | Disposition: A | Discharge status: Home / Self Care | Payer: Medicare Other | Attending: Internal Medicine | Admitting: Internal Medicine

## 2018-07-01 DIAGNOSIS — H548 Legal blindness, as defined in USA: Secondary | ICD-10-CM | POA: Diagnosis not present

## 2018-07-01 DIAGNOSIS — R200 Anesthesia of skin: Secondary | ICD-10-CM | POA: Diagnosis not present

## 2018-07-01 DIAGNOSIS — Z79899 Other long term (current) drug therapy: Secondary | ICD-10-CM | POA: Diagnosis not present

## 2018-07-01 DIAGNOSIS — R2990 Unspecified symptoms and signs involving the nervous system: Secondary | ICD-10-CM

## 2018-07-01 DIAGNOSIS — I442 Atrioventricular block, complete: Secondary | ICD-10-CM | POA: Diagnosis not present

## 2018-07-01 DIAGNOSIS — I951 Orthostatic hypotension: Secondary | ICD-10-CM | POA: Diagnosis present

## 2018-07-01 DIAGNOSIS — D649 Anemia, unspecified: Secondary | ICD-10-CM | POA: Diagnosis not present

## 2018-07-01 DIAGNOSIS — I480 Paroxysmal atrial fibrillation: Principal | ICD-10-CM | POA: Insufficient documentation

## 2018-07-01 DIAGNOSIS — R29818 Other symptoms and signs involving the nervous system: Secondary | ICD-10-CM | POA: Diagnosis not present

## 2018-07-01 DIAGNOSIS — I959 Hypotension, unspecified: Secondary | ICD-10-CM | POA: Diagnosis not present

## 2018-07-01 DIAGNOSIS — H4720 Unspecified optic atrophy: Secondary | ICD-10-CM | POA: Diagnosis not present

## 2018-07-01 DIAGNOSIS — Z8673 Personal history of transient ischemic attack (TIA), and cerebral infarction without residual deficits: Secondary | ICD-10-CM | POA: Insufficient documentation

## 2018-07-01 DIAGNOSIS — F4322 Adjustment disorder with anxiety: Secondary | ICD-10-CM | POA: Insufficient documentation

## 2018-07-01 DIAGNOSIS — Z8679 Personal history of other diseases of the circulatory system: Secondary | ICD-10-CM | POA: Diagnosis not present

## 2018-07-01 DIAGNOSIS — R41841 Cognitive communication deficit: Secondary | ICD-10-CM | POA: Diagnosis not present

## 2018-07-01 DIAGNOSIS — Z950 Presence of cardiac pacemaker: Secondary | ICD-10-CM | POA: Insufficient documentation

## 2018-07-01 DIAGNOSIS — R4789 Other speech disturbances: Secondary | ICD-10-CM | POA: Diagnosis not present

## 2018-07-01 DIAGNOSIS — R441 Visual hallucinations: Secondary | ICD-10-CM | POA: Diagnosis not present

## 2018-07-01 DIAGNOSIS — E039 Hypothyroidism, unspecified: Secondary | ICD-10-CM | POA: Insufficient documentation

## 2018-07-01 DIAGNOSIS — G459 Transient cerebral ischemic attack, unspecified: Secondary | ICD-10-CM | POA: Diagnosis present

## 2018-07-01 DIAGNOSIS — I639 Cerebral infarction, unspecified: Secondary | ICD-10-CM | POA: Diagnosis not present

## 2018-07-01 DIAGNOSIS — I495 Sick sinus syndrome: Secondary | ICD-10-CM | POA: Diagnosis not present

## 2018-07-01 DIAGNOSIS — R202 Paresthesia of skin: Secondary | ICD-10-CM | POA: Diagnosis not present

## 2018-07-01 DIAGNOSIS — H33002 Unspecified retinal detachment with retinal break, left eye: Secondary | ICD-10-CM | POA: Diagnosis not present

## 2018-07-01 DIAGNOSIS — I4891 Unspecified atrial fibrillation: Secondary | ICD-10-CM | POA: Diagnosis present

## 2018-07-01 DIAGNOSIS — M6281 Muscle weakness (generalized): Secondary | ICD-10-CM | POA: Diagnosis not present

## 2018-07-01 DIAGNOSIS — Z7901 Long term (current) use of anticoagulants: Secondary | ICD-10-CM | POA: Diagnosis not present

## 2018-07-01 DIAGNOSIS — R531 Weakness: Secondary | ICD-10-CM | POA: Diagnosis not present

## 2018-07-01 LAB — DIFFERENTIAL
Abs Immature Granulocytes: 0.01 10*3/uL (ref 0.00–0.07)
Basophils Absolute: 0 10*3/uL (ref 0.0–0.1)
Basophils Relative: 0 %
Eosinophils Absolute: 0.1 10*3/uL (ref 0.0–0.5)
Eosinophils Relative: 2 %
Immature Granulocytes: 0 %
Lymphocytes Relative: 29 %
Lymphs Abs: 1.3 10*3/uL (ref 0.7–4.0)
Monocytes Absolute: 0.4 10*3/uL (ref 0.1–1.0)
Monocytes Relative: 9 %
Neutro Abs: 2.8 10*3/uL (ref 1.7–7.7)
Neutrophils Relative %: 60 %

## 2018-07-01 LAB — COMPREHENSIVE METABOLIC PANEL
ALT: 13 U/L (ref 0–44)
AST: 22 U/L (ref 15–41)
Albumin: 3.8 g/dL (ref 3.5–5.0)
Alkaline Phosphatase: 83 U/L (ref 38–126)
Anion gap: 9 (ref 5–15)
BUN: 20 mg/dL (ref 8–23)
CO2: 29 mmol/L (ref 22–32)
Calcium: 8.7 mg/dL — ABNORMAL LOW (ref 8.9–10.3)
Chloride: 104 mmol/L (ref 98–111)
Creatinine, Ser: 1.33 mg/dL — ABNORMAL HIGH (ref 0.44–1.00)
GFR calc Af Amer: 41 mL/min — ABNORMAL LOW (ref >60)
GFR calc non Af Amer: 36 mL/min — ABNORMAL LOW (ref >60)
Glucose, Bld: 167 mg/dL — ABNORMAL HIGH (ref 70–99)
Potassium: 4 mmol/L (ref 3.5–5.1)
Sodium: 142 mmol/L (ref 135–145)
Total Bilirubin: 0.5 mg/dL (ref 0.3–1.2)
Total Protein: 6.4 g/dL — ABNORMAL LOW (ref 6.5–8.1)

## 2018-07-01 LAB — CBC
HCT: 42.2 % (ref 36.0–46.0)
Hemoglobin: 13.6 g/dL (ref 12.0–15.0)
MCH: 31.4 pg (ref 26.0–34.0)
MCHC: 32.2 g/dL (ref 30.0–36.0)
MCV: 97.5 fL (ref 80.0–100.0)
Platelets: 131 10*3/uL — ABNORMAL LOW (ref 150–400)
RBC: 4.33 MIL/uL (ref 3.87–5.11)
RDW: 13.2 % (ref 11.5–15.5)
WBC: 4.6 10*3/uL (ref 4.0–10.5)
nRBC: 0 % (ref 0.0–0.2)

## 2018-07-01 LAB — URINALYSIS, ROUTINE W REFLEX MICROSCOPIC
Bacteria, UA: NONE SEEN
Bilirubin Urine: NEGATIVE
Glucose, UA: NEGATIVE mg/dL
Ketones, ur: NEGATIVE mg/dL
Nitrite: NEGATIVE
Protein, ur: NEGATIVE mg/dL
Specific Gravity, Urine: 1.015 (ref 1.005–1.030)
pH: 6 (ref 5.0–8.0)

## 2018-07-01 LAB — RAPID URINE DRUG SCREEN, HOSP PERFORMED
Amphetamines: NOT DETECTED
Barbiturates: NOT DETECTED
Benzodiazepines: NOT DETECTED
Cocaine: NOT DETECTED
Opiates: NOT DETECTED
Tetrahydrocannabinol: NOT DETECTED

## 2018-07-01 LAB — APTT: aPTT: 37 seconds — ABNORMAL HIGH (ref 24–36)

## 2018-07-01 LAB — PROTIME-INR
INR: 2.2 — ABNORMAL HIGH (ref 0.8–1.2)
Prothrombin Time: 23.7 seconds — ABNORMAL HIGH (ref 11.4–15.2)

## 2018-07-01 LAB — CBG MONITORING, ED: Glucose-Capillary: 159 mg/dL — ABNORMAL HIGH (ref 70–99)

## 2018-07-01 LAB — ETHANOL: Alcohol, Ethyl (B): <10 mg/dL (ref <10)

## 2018-07-01 MED ORDER — STROKE: EARLY STAGES OF RECOVERY BOOK
Freq: Once | Status: AC
  Administered 2018-07-01: 18:00:00

## 2018-07-01 MED ORDER — SODIUM CHLORIDE 0.9 % IV SOLN
INTRAVENOUS | Status: DC
  Administered 2018-07-01: 18:00:00 via INTRAVENOUS

## 2018-07-01 MED ORDER — QUETIAPINE FUMARATE 25 MG PO TABS
25.0000 mg | ORAL_TABLET | Freq: Every day | ORAL | Status: DC
  Administered 2018-07-01 – 2018-07-02 (×2): 25 mg via ORAL
  Filled 2018-07-01 (×2): qty 1

## 2018-07-01 MED ORDER — ACETAMINOPHEN 500 MG PO TABS
500.0000 mg | ORAL_TABLET | Freq: Four times a day (QID) | ORAL | Status: DC | PRN
  Administered 2018-07-02 – 2018-07-03 (×3): 500 mg via ORAL
  Filled 2018-07-01 (×3): qty 1

## 2018-07-01 MED ORDER — QUETIAPINE FUMARATE 25 MG PO TABS
12.5000 mg | ORAL_TABLET | Freq: Every morning | ORAL | Status: DC
  Administered 2018-07-02 – 2018-07-03 (×2): 12.5 mg via ORAL
  Filled 2018-07-01 (×2): qty 1

## 2018-07-01 MED ORDER — SENNOSIDES-DOCUSATE SODIUM 8.6-50 MG PO TABS: 1.0000 | ORAL_TABLET | Freq: Every evening | ORAL | Status: DC | PRN

## 2018-07-01 MED ORDER — FLUDROCORTISONE ACETATE 0.1 MG PO TABS
0.1000 mg | ORAL_TABLET | Freq: Two times a day (BID) | ORAL | Status: DC
  Administered 2018-07-02 – 2018-07-03 (×3): 0.1 mg via ORAL
  Filled 2018-07-01 (×5): qty 1

## 2018-07-01 MED ORDER — ROSUVASTATIN CALCIUM 20 MG PO TABS
20.0000 mg | ORAL_TABLET | Freq: Every day | ORAL | Status: DC
  Administered 2018-07-02 – 2018-07-03 (×2): 20 mg via ORAL
  Filled 2018-07-01 (×2): qty 1

## 2018-07-01 NOTE — ED Notes (Signed)
ED TO INPATIENT HANDOFF REPORT  ED Nurse Name and Phone #: Hannie [phone]  S Name/Age/Gender Katrina Rodriguez 85 y.o. female Room/Bed: 034C/034C  Code Status   Code Status: Prior  Home/SNF/Other Home Patient oriented to: self, place, time and situation Is this baseline? Yes   Triage Complete: Triage complete  Chief Complaint Facial Droop; Slurred Speech; Weakness  Triage Note Pt from home; sent by pcp for evaluation of possible stroke; pt states "I felt like my face was asleep around my mouth", also c/o weakness in L leg; hx of stroke; denies feeling weak in arm; pt says she got up at 0430 this am and ate breakfast, noticed symptoms after; pt states she feel like she's having trouble getting words out; pt blind; pt states she is normally weaker on the left side from previous stroke   Allergies Allergies  Allergen Reactions  . Amiodarone Other (See Comments)    Caused thyroid problems  . Amoxicillin Hives  . Aspirin Other (See Comments)    On tikosyn and warfarin  . Codeine Other (See Comments)    Causes BP to drop  . Darvocet [Propoxyphene N-Acetaminophen] Hives  . Demerol Itching  . Eliquis [Apixaban] Nausea Only  . Penicillins Hives    Did it involve swelling of the face/tongue/throat, SOB, or low BP? Unknown Did it involve sudden or severe rash/hives, skin peeling, or any reaction on the inside of your mouth or nose? Unknown Did you need to seek medical attention at a hospital or doctor's office? Unknown When did it last happen?young adult or child?? If all above answers are "NO", may proceed with cephalosporin use.  Alveda Reasons [Rivaroxaban] Rash    Level of Care/Admitting Diagnosis ED Disposition    ED Disposition Condition [facility] Area: [place] [100100]  Level of Care: Telemetry Medical [104]  I expect the patient will be discharged within 24 hours: Yes  LOW acuity---Tx typically complete <24 hrs---ACUTE conditions  typically can be evaluated <24 hours---LABS likely to return to acceptable levels <24 hours---IS near functional baseline---EXPECTED to return to current living arrangement---NOT newly hypoxic: Meets criteria for 5C-Observation unit  Covid Evaluation: N/A  Diagnosis: TIA (transient ischemic attack) [585277]  Admitting Physician: Elmarie Shiley [phone]  Attending Physician: Niel Hummer A [[id number]  PT Class (Do Not Modify): Observation [104]  PT Acc Code (Do Not Modify): Observation [10022]       B Medical/Surgery History Past Medical History:  Diagnosis Date  . Arthritis   . Atrial fibrillation (HCC)    History of ablation - Dr. Rayann Heman  . Chest pain    Has normal coronaries per cath in 2005; negative nuclear in 2010  . Chronic anemia   . Complete heart block ([place]) 06/09/2013  . H/O: CVA (cardiovascular accident)    Right internal capsule  . Hypothyroidism 06/19/2017  . Left-sided weakness 06/19/2017  . Mild pulmonary hypertension ([place])   . 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 Orthostasis   . Retinal detachment   . Thrombocytopenia ([place])   . TIA (transient ischemic attack)    Past Surgical History:  Procedure Laterality Date  . CARDIAC CATHETERIZATION    . CARDIAC PACEMAKER PLACEMENT    . PERMANENT PACEMAKER GENERATOR CHANGE N/A 06/12/2012   Procedure: PERMANENT PACEMAKER GENERATOR CHANGE;  Surgeon: Evans Lance, MD;  Location: [facility] CATH LAB;  Service: Cardiovascular;  Laterality: N/A;  . RETINAL DETACHMENT SURGERY       A IV  Location/Drains/Wounds Patient Lines/Drains/Airways Status   Active Line/Drains/Airways    Name:   Placement date:   Placement time:   Site:   Days:   Peripheral IV 07/01/18 Left Forearm   07/01/18    1344    Forearm   less than 1          Intake/Output Last 24 hours No intake or output data in the 24 hours ending 07/01/18 1537  Labs/Imaging Results for orders placed or performed during the hospital encounter of 07/01/18 (from the past  48 hour(s))  CBG monitoring, ED     Status: Abnormal   Collection Time: 07/01/18  1:34 PM  Result Value Ref Range   Glucose-Capillary 159 (H) 70 - 99 mg/dL  Protime-INR     Status: Abnormal   Collection Time: 07/01/18  1:43 PM  Result Value Ref Range   Prothrombin Time 23.7 (H) 11.4 - 15.2 seconds   INR 2.2 (H) 0.8 - 1.2    Comment: (NOTE) INR goal varies based on device and disease states. Performed at [facility], [place] [address] [place], [place] [address]   APTT     Status: Abnormal   Collection Time: 07/01/18  1:43 PM  Result Value Ref Range   aPTT 37 (H) 24 - 36 seconds    Comment:        IF BASELINE aPTT IS ELEVATED, SUGGEST PATIENT RISK ASSESSMENT BE USED TO DETERMINE APPROPRIATE ANTICOAGULANT THERAPY. Performed at [facility], [place] [address], [place], [place] [address]   CBC     Status: Abnormal   Collection Time: 07/01/18  1:43 PM  Result Value Ref Range   WBC 4.6 4.0 - 10.5 K/uL   RBC 4.33 3.87 - 5.11 MIL/uL   Hemoglobin 13.6 12.0 - 15.0 g/dL   HCT 42.2 36.0 - 46.0 %   MCV 97.5 80.0 - 100.0 fL   MCH 31.4 26.0 - 34.0 pg   MCHC 32.2 30.0 - 36.0 g/dL   RDW 13.2 11.5 - 15.5 %   Platelets 131 (L) 150 - 400 K/uL    Comment: REPEATED TO VERIFY SPECIMEN CHECKED FOR CLOTS    nRBC 0.0 0.0 - 0.2 %    Comment: Performed at [facility], [place] [address], [place], [place] [address]  Differential     Status: None   Collection Time: 07/01/18  1:43 PM  Result Value Ref Range   Neutrophils Relative % 60 %   Neutro Abs 2.8 1.7 - 7.7 K/uL   Lymphocytes Relative 29 %   Lymphs Abs 1.3 0.7 - 4.0 K/uL   Monocytes Relative 9 %   Monocytes Absolute 0.4 0.1 - 1.0 K/uL   Eosinophils Relative 2 %   Eosinophils Absolute 0.1 0.0 - 0.5 K/uL   Basophils Relative 0 %   Basophils Absolute 0.0 0.0 - 0.1 K/uL   Immature Granulocytes 0 %   Abs Immature Granulocytes 0.01 0.00 - 0.07 K/uL    Comment: Performed at [facility] [address] [place], [place]  [address]  Comprehensive metabolic panel     Status: Abnormal   Collection Time: 07/01/18  1:43 PM  Result Value Ref Range   Sodium 142 135 - 145 mmol/L   Potassium 4.0 3.5 - 5.1 mmol/L   Chloride 104 98 - 111 mmol/L   CO2 29 22 - 32 mmol/L   Glucose, Bld 167 (H) 70 - 99 mg/dL   BUN 20 8 - 23 mg/dL  Creatinine, Ser 1.33 (H) 0.44 - 1.00 mg/dL   Calcium 8.7 (L) 8.9 - 10.3 mg/dL   Total Protein 6.4 (L) 6.5 - 8.1 g/dL   Albumin 3.8 3.5 - 5.0 g/dL   AST 22 15 - 41 U/L   ALT 13 0 - 44 U/L   Alkaline Phosphatase 83 38 - 126 U/L   Total Bilirubin 0.5 0.3 - 1.2 mg/dL   GFR calc non Af Amer 36 (L) >60 mL/min   GFR calc Af Amer 41 (L) >60 mL/min   Anion gap 9 5 - 15    Comment: Performed at [facility] [address], [place], [place] [address]  Ethanol     Status: None   Collection Time: 07/01/18  2:05 PM  Result Value Ref Range   Alcohol, Ethyl (B) <10 <10 mg/dL    Comment: (NOTE) Lowest detectable limit for serum alcohol is 10 mg/dL. For medical purposes only. Performed at [facility], [place] [address], [place], [place] [address]   Urine rapid drug screen (hosp performed)     Status: None   Collection Time: 07/01/18  2:28 PM  Result Value Ref Range   Opiates NONE DETECTED NONE DETECTED   Cocaine NONE DETECTED NONE DETECTED   Benzodiazepines NONE DETECTED NONE DETECTED   Amphetamines NONE DETECTED NONE DETECTED   Tetrahydrocannabinol NONE DETECTED NONE DETECTED   Barbiturates NONE DETECTED NONE DETECTED    Comment: (NOTE) DRUG SCREEN FOR MEDICAL PURPOSES ONLY.  IF CONFIRMATION IS NEEDED FOR ANY PURPOSE, NOTIFY LAB WITHIN 5 DAYS. LOWEST DETECTABLE LIMITS FOR URINE DRUG SCREEN Drug Class                     Cutoff (ng/mL) Amphetamine and metabolites    1000 Barbiturate and metabolites    200 Benzodiazepine                 409 Tricyclics and metabolites     300 Opiates and metabolites        300 Cocaine and metabolites        300 THC                             50 Performed at [facility], [place] [address] [place], [place] [address]   Urinalysis, Routine w reflex microscopic     Status: Abnormal   Collection Time: 07/01/18  2:28 PM  Result Value Ref Range   Color, Urine YELLOW YELLOW   APPearance CLEAR CLEAR   Specific Gravity, Urine 1.015 1.005 - 1.030   pH 6.0 5.0 - 8.0   Glucose, UA NEGATIVE NEGATIVE mg/dL   Hgb urine dipstick MODERATE (A) NEGATIVE   Bilirubin Urine NEGATIVE NEGATIVE   Ketones, ur NEGATIVE NEGATIVE mg/dL   Protein, ur NEGATIVE NEGATIVE mg/dL   Nitrite NEGATIVE NEGATIVE   Leukocytes,Ua SMALL (A) NEGATIVE   RBC / HPF 6-10 0 - 5 RBC/hpf   WBC, UA 11-20 0 - 5 WBC/hpf   Bacteria, UA NONE SEEN NONE SEEN   Squamous Epithelial / LPF 0-5 0 - 5    Comment: Performed at [facility], [place] [address], [place], [place] [address]   Ct Head Wo Contrast  Result Date: 07/01/2018 CLINICAL DATA:  Focal neuro deficit.  Left leg weakness. EXAM: CT HEAD WITHOUT CONTRAST TECHNIQUE: Contiguous axial images were obtained from the base of the skull through the vertex without intravenous contrast.  COMPARISON:  CT head 06/01/2018 FINDINGS: Brain: Mild atrophy. Negative for acute infarct, hemorrhage, or mass. Mild chronic microvascular ischemic changes in the white matter Vascular: Negative for hyperdense vessel Skull: Negative Sinuses/Orbits: Prior ocular surgery on the left with hyperdense vitreous. No orbital mass. Other: None IMPRESSION: Atrophy and mild chronic microvascular ischemic change. No acute abnormality. Electronically Signed   By: Franchot Gallo M.D.   On: 07/01/2018 15:12    Pending Labs Unresulted Labs (From admission, onward)   None      Vitals/Pain Today's Vitals   07/01/18 1415 07/01/18 1445 07/01/18 1500 07/01/18 1515  BP: (!) 142/68 (!) 141/61 138/61 133/60  Pulse: 60 (!) 59 60 60  Resp: 13 13 11 13   Temp:      TempSrc:      SpO2: 100% 99% 99% 99%  Weight:      Height:      PainSc:        Isolation  Precautions No active isolations  Medications Medications - No data to display  Mobility walks with device High fall risk   Focused Assessments Neuro Assessment Handoff:  Swallow screen pass? No  Cardiac Rhythm: Normal sinus rhythm NIH Stroke Scale ( + Modified Stroke Scale Criteria)  Level of Consciousness (1a.)   : Alert, keenly responsive LOC Questions (1b. )   +: Answers both questions correctly LOC Commands (1c. )   + : Performs both tasks correctly Best Gaze (2. )  +: Normal Visual (3. )  +: No visual loss(pt blind at baseline) Facial Palsy (4. )    : Minor paralysis(left) Motor Arm, Left (5a. )   +: Drift Motor Arm, Right (5b. )   +: No drift Motor Leg, Left (6a. )   +: Some effort against gravity Motor Leg, Right (6b. )   +: No drift Limb Ataxia (7. ): Present in two limbs Sensory (8. )   +: Mild-to-moderate sensory loss, patient feels pinprick is less sharp or is dull on the affected side, or there is a loss of superficial pain with pinprick, but patient is aware of being touched Best Language (9. )   +: No aphasia Dysarthria (10. ): Normal Extinction/Inattention (11.)   +: No Abnormality Modified SS Total  +: 4 Complete NIHSS TOTAL: 7     Neuro Assessment: Exceptions to WDL Neuro Checks:      Last Documented NIHSS Modified Score: 4 (07/01/18 1346) Has TPA been given? No If patient is a Neuro Trauma and patient is going to OR before floor call report to [place] nurse: [phone] or [phone]     R Recommendations: See Admitting Provider Note  Report given to:   Additional Notes:

## 2018-07-01 NOTE — Progress Notes (Signed)
ANTICOAGULATION CONSULT NOTE - Initial Consult  Pharmacy Consult for warfarin Indication: atrial fibrillation  Allergies  Allergen Reactions  . Amiodarone Other (See Comments)    Caused thyroid problems  . Amoxicillin Hives  . Aspirin Other (See Comments)    On tikosyn and warfarin  . Codeine Other (See Comments)    Causes BP to drop  . Darvocet [Propoxyphene N-Acetaminophen] Hives  . Demerol Itching  . Eliquis [Apixaban] Nausea Only  . Penicillins Hives    Did it involve swelling of the face/tongue/throat, SOB, or low BP? Unknown Did it involve sudden or severe rash/hives, skin peeling, or any reaction on the inside of your mouth or nose? Unknown Did you need to seek medical attention at a hospital or doctor's office? Unknown When did it last happen?young adult or child?? If all above answers are "NO", may proceed with cephalosporin use.  Alveda Reasons [Rivaroxaban] Rash    Patient Measurements: Height: 5\' 4"  (162.6 cm) Weight: 111 lb (50.3 kg) IBW/kg (Calculated) : 54.7  Vital Signs: Temp: 97.9 F (36.6 C) (04/27 1317) Temp Source: Oral (04/27 1317) BP: 135/84 (04/27 1530) Pulse Rate: 69 (04/27 1530)  Labs: Recent Labs    07/01/18 1343  HGB 13.6  HCT 42.2  PLT 131*  APTT 37*  LABPROT 23.7*  INR 2.2*  CREATININE 1.33*    Estimated Creatinine Clearance: 23.2 mL/min (A) (by C-G formula based on SCr of 1.33 mg/dL (H)).   Medical History: Past Medical History:  Diagnosis Date  . Arthritis   . Atrial fibrillation (HCC)    History of ablation - Dr. Rayann Heman  . Chest pain    Has normal coronaries per cath in 2005; negative nuclear in 2010  . Chronic anemia   . Complete heart block ([place]) 06/09/2013  . H/O: CVA (cardiovascular accident)    Right internal capsule  . Hypothyroidism 06/19/2017  . Left-sided weakness 06/19/2017  . Mild pulmonary hypertension ([place])   . 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 Orthostasis   . Retinal detachment   .  Thrombocytopenia ([place])   . TIA (transient ischemic attack)    Assessment: 85yo female presents with right facial numbness, weakness, slurred speech and left lower extremity weakness. PMH includes atrial fibrillation on coumadin, complete heart block s/p pacemaker, CVA right eye optic atrophy, retinal detachment, and h/o TIA. Pharmacy consulted to start warfarin repeat after CT head scheduled for 4/28. INR on admission therapeutic at 2.2. H/H 13.6/42.2.   Warfarin dose PTA: 4mg  MWF and 2 mg on all other days with last dose taken on 4/27 PTA  Goal of Therapy:  INR 2-3 Monitor platelets by anticoagulation protocol: Yes   Plan:  Will hold warfarin tonight. F/u scheduling dose tomorrow after head CT obtained  Monitor daily INR, CBCs, and s/sx of bleeding   Gwenlyn Found, [place] D PGY1 Pharmacy Resident  Phone [phone] 07/01/2018   4:50 PM

## 2018-07-01 NOTE — ED Triage Notes (Addendum)
Pt from home; sent by pcp for evaluation of possible stroke; pt states "I felt like my face was asleep around my mouth", also c/o weakness in L leg; hx of stroke; denies feeling weak in arm; pt says she got up at 0430 this am and ate breakfast, noticed symptoms after; pt states she feel like she's having trouble getting words out; pt blind; pt states she is normally weaker on the left side from previous stroke

## 2018-07-01 NOTE — Consult Note (Addendum)
Neurology Consultation  Reason for Consult: Left-sided weakness Referring Physician: Niel Hummer A,   History is obtained from: Patient  HPI: Katrina Rodriguez is a 85 y.o. female with history of atrial fibrillation, heart block with pacemaker, prior CVA, pulmonary hypertension who presented to the emergency department for left-sided weakness and speech changes.  She was last seen normal at 4:30 in the AM.  She awoke and was feeling okay.  She went to eat breakfast and around 430 she noticed some difficulty speaking.  She also noted that she was drooling out of the left side of her mouth along with tingling and numbness.  When she got up to walk she noticed that her left leg was weaker but was hoping this would resolve.  She was having home health physical therapy today when he also noticed that she was weaker on the left leg just told her to come into the hospital to be evaluated.  This concerned her, as she had had a stroke 1 year prior to which she does state that she was left with left-sided weakness.  Currently her major complaint is the increased left-sided weakness and tingling on the left side of her mouth.  She has no dysarthria at this time.  She states that currently she is living with a roommate.  ED course CT head was obtained that showed atrophy and mild chronic microvascular ischemic changes but no acute abnormalities.  Laboratory values  Stroke work-up 1 year ago showed: Left-sided 4/5 strength in the arm and leg LDL 134 Carotid Doppler showed bilateral ICA 1-39% stenosis Patient did have a CT angios on 4/18/ 2019 which did not show any proximal stenosis or large vessel occlusion.  No visible developing infarct in the brain.  LKW: 430 tpa given?: no, out of window Premorbid modified Rankin scale (mRS): 2 NIHSS 4  ROS: A 14 point ROS was performed and is negative except as noted in the HPI.   Past Medical History:  Diagnosis Date  . Arthritis   . Atrial fibrillation (HCC)     History of ablation - Dr. Rayann Heman  . Chest pain    Has normal coronaries per cath in 2005; negative nuclear in 2010  . Chronic anemia   . Complete heart block ([place]) 06/09/2013  . H/O: CVA (cardiovascular accident)    Right internal capsule  . Hypothyroidism 06/19/2017  . Left-sided weakness 06/19/2017  . Mild pulmonary hypertension ([employer])   . 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 Orthostasis   . Retinal detachment   . Thrombocytopenia ([place])   . TIA (transient ischemic attack)    Family History  Problem Relation Age of Onset  . Heart disease Father   . Stroke Father   . Heart attack Father   . Heart disease Brother   . Heart disease Brother   . Hypertension Mother   . Diabetes Sister   . Heart attack Sister    Social History:   reports that she has never smoked. She has never used smokeless tobacco. She reports that she does not drink alcohol or use drugs.  Medications No current facility-administered medications for this encounter.   Current Outpatient Medications:  .  acetaminophen (TYLENOL) 500 MG tablet, Take 500 mg by mouth every 6 (six) hours as needed for moderate pain., Disp: , Rfl:  .  dofetilide (TIKOSYN) 250 MCG capsule, Take 250 mcg by mouth 2 (two) times daily., Disp: , Rfl:  .  fludrocortisone (FLORINEF) 0.1 MG tablet, Take 1  tablet (0.1 mg total) by mouth 2 (two) times daily., Disp: 180 tablet, Rfl: 1 .  potassium chloride SA (K-DUR,KLOR-CON) 20 MEQ tablet, Take 2 tablets (40 mEq total) by mouth daily. (Patient taking differently: Take 20 mEq by mouth daily. ), Disp: 180 tablet, Rfl: 3 .  QUEtiapine (SEROQUEL) 25 MG tablet, 1/2 tablet in the morning, 1 tablet at night, Disp: 45 tablet, Rfl: 2 .  rosuvastatin (CRESTOR) 10 MG tablet, Take 1 tablet (10 mg total) by mouth daily., Disp: 90 tablet, Rfl: 3 .  warfarin (COUMADIN) 4 MG tablet, Take 1/2 to 1 tablet daily as directed by coumadin clinic (Patient taking differently: Take 2-4 mg by mouth See admin  instructions. Take 1 tablet (4 mg) by mouth on Monday, Wednesday, Friday, take 1/2 tablet (2 mg) on Sunday, Tuesday, Thursday, Saturday), Disp: 90 tablet, Rfl: 1   Exam: Current vital signs: BP (!) 141/61   Pulse (!) 59   Temp 97.9 F (36.6 C) (Oral)   Resp 13   Ht 5\' 4"  (1.626 m)   Wt 50.3 kg   SpO2 99%   BMI 19.05 kg/m  Vital signs in last 24 hours: Temp:  [97.9 F (36.6 C)] 97.9 F (36.6 C) (04/27 1317) Pulse Rate:  [59-73] 59 (04/27 1445) Resp:  [11-18] 13 (04/27 1445) BP: (133-146)/(61-71) 141/61 (04/27 1445) SpO2:  [98 %-100 %] 99 % (04/27 1445) Weight:  [50.3 kg] 50.3 kg (04/27 1338)  Physical Exam  Constitutional: Appears well-developed and well-nourished.  Psych: Affect appropriate to situation Eyes: No scleral injection HENT: No OP obstrucion Head: Normocephalic.  Cardiovascular: Normal rate and regular rhythm.  Respiratory: Effort normal, non-labored breathing GI: Soft.  No distension. There is no tenderness.  Skin: WDI  Neuro: Mental Status: Patient is awake, alert, oriented to person, place, month, year, and situation. Patient is able to give a clear and coherent history. Patient does have signs of neglect on the left side Cranial Nerves: II: She is blind at baseline and can only see shadows III,IV, VI: Due to surgery her left eye can deviate laterally but cannot cross midline.  And at rest the left eye is laterally deviated.  The right has full range of motion. Pupils equal, round and reactive to light-with APD V: Facial sensation decreased to light touch in the V2 3 region but bilaterally normal in the forehead VII: Facial movement is symmetric.  VIII: hearing is intact to voice X: Palat elevates symmetrically XI: Shoulder shrug is symmetric. XII: tongue is midline without atrophy or fasciculations.  Motor: She is weaker on the left with 4/5 strength in the upper extremity and 4/5 on the lower extremity.  She has drift on both the left arm greater  than leg.  Right arm and leg have 5/5 strength Sensory: Decreased on the left arm and leg.  To DSS she neglects the left side Deep Tendon Reflexes: 2+ and symmetric in the biceps and patellae.  Plantars: Toes are downgoing bilaterally.  Cerebellar: As she is blind I cannot do finger-nose-finger however when she touched her nose and reached out straight I did not notice any dysmetria  Labs I have reviewed labs in epic and the results pertinent to this consultation are:   CBC    Component Value Date/Time   WBC 4.6 07/01/2018 1343   RBC 4.33 07/01/2018 1343   HGB 13.6 07/01/2018 1343   HCT 42.2 07/01/2018 1343   PLT 131 (L) 07/01/2018 1343   MCV 97.5 07/01/2018 1343   MCH  31.4 07/01/2018 1343   MCHC 32.2 07/01/2018 1343   RDW 13.2 07/01/2018 1343   LYMPHSABS 1.3 07/01/2018 1343   MONOABS 0.4 07/01/2018 1343   EOSABS 0.1 07/01/2018 1343   BASOSABS 0.0 07/01/2018 1343    CMP     Component Value Date/Time   NA 142 07/01/2018 1343   NA 142 03/07/2018 1019   K 4.0 07/01/2018 1343   CL 104 07/01/2018 1343   CO2 29 07/01/2018 1343   GLUCOSE 167 (H) 07/01/2018 1343   BUN 20 07/01/2018 1343   BUN 21 03/07/2018 1019   CREATININE 1.33 (H) 07/01/2018 1343   CREATININE 1.25 (H) 07/30/2015 1106   CALCIUM 8.7 (L) 07/01/2018 1343   PROT 6.4 (L) 07/01/2018 1343   PROT 6.2 10/31/2017 0819   ALBUMIN 3.8 07/01/2018 1343   ALBUMIN 4.2 10/31/2017 0819   AST 22 07/01/2018 1343   ALT 13 07/01/2018 1343   ALKPHOS 83 07/01/2018 1343   BILITOT 0.5 07/01/2018 1343   BILITOT 0.6 10/31/2017 0819   GFRNONAA 36 (L) 07/01/2018 1343   GFRAA 41 (L) 07/01/2018 1343    Lipid Panel     Component Value Date/Time   CHOL 139 10/31/2017 0819   TRIG 90 10/31/2017 0819   HDL 60 10/31/2017 0819   CHOLHDL 3.8 06/20/2017 0800   VLDL 18 06/20/2017 0800   LDLCALC 61 10/31/2017 0819     Imaging I have reviewed the images obtained:  CT-scan of the brain- CT head showed atrophy and mild chronic  microvascular ischemic changes but no acute abnormality  MRI examination of the brain-unable to obtain MRI secondary to pacemaker  Etta Quill PA-C [facility] [phone]  M-F  (9:00 am- 5:00 PM)  07/01/2018, 3:21 PM     Assessment:  This is an 85 year old female with past medical history of atrial fibrillation who is on Coumadin with current INR of 2.2 presenting to the emergency department today due to increased left-sided leg weakness, transient dysarthria and paresthesia of the left side of the mouth.  Unfortunately patient cannot have an MRI secondary to pacemaker and CT of head did not show any acute infarct.  Exam shows continued 4/5 weakness of the left arm and leg and subjective decreased sensation on the left face along with neglect of the left side of the body with DSS.  I do believe patient likely has had another right-sided stroke which increased the leg weakness and gave her her dysarthria along with paresthesias.    Impression: -stroke Recommend #Transthoracic Echo,  #Hold Coumadin #Start or continue Atorvastatin 80 mg/other high intensity statin # BP goal: permissive HTN upto 185/110 mmHg # HBAIC and Lipid profile # Telemetry monitoring # Frequent neuro checks # NPO until passes stroke swallow screen # please page stroke NP  Or  PA  Or MD from 8am -4 pm  as this patient from this time will be  followed by the stroke.   You can look them up on [email or web address]  Password [email or web address]   NEUROHOSPITALIST ADDENDUM Performed a face to face diagnostic evaluation.   I have reviewed the contents of history and physical exam as documented by PA/ARNP/Resident and agree with above documentation.  I have discussed and formulated the above plan as documented. Edits to the note have been made as needed.  Patient presented with A. fib history of CVA with residual left hemiparesis presenting with worsening\left-sided weakness.  Patient on warfarin which is therapeutic at 2.2.   She is not able to get MRI  due to pacemaker-repeat CT head.  Also hold Coumadin until tomorrow, in case patient has moderate to large stroke-to reduce chances of hemorrhagic conversion. Limited stroke work-up-carotid Doppler, echocardiogram, A1c lipid profile.   Karena Addison Finn Amos MD [facility] [phone]   If 7pm to 7am, please call on call as listed on AMION.

## 2018-07-01 NOTE — H&P (Signed)
History and Physical  Katrina Rodriguez:[id number] DOB: 04-May-1928 DOA: 07/01/2018  PCP: Leighton Ruff, MD Patient coming from: Home   I have personally briefly reviewed patient's old medical records in [place]   Chief Complaint: slurred speech, left LE weakness.   HPI: Katrina Rodriguez is a 85 y.o. female  Afib on coumadin, post ablation,  Complete heart block S/P pacemaker, CVA right eye optic atrophy, retinal detachment, hypothyroidism, history of TIA who presents complaining of right facial numbness, weakness, slurred speech and left lower extremity weakness that is started the morning of admission.  Patient wake up around 4:30 AM to eat ice cream, when she noticed numbness in the right side of her face and her speech was not right.  She also noticed some left lower extremity weakness.  Patient later during the morning, was working with her physical therapist who also noticed worsening left lower extremity weakness.  On arrival to the hospital patienthas improved, she was noted to have a mild facial droop that has resolved at the time of my evaluation.  She is legally blind.  He can only see lights on her right eye. Patient denies chest pain, shortness of breath, headache.   Evaluation in the ED; CT head; Atrophy and mild chronic microvascular ischemic change. No acute abnormality.  Sodium 142, potassium 4.0, BUN 20, creatinine 1.3, INR 2.2, UA white blood cell 11-20.     Review of Systems: All systems reviewed and apart from history of presenting illness, are negative.  Past Medical History:  Diagnosis Date   Arthritis    Atrial fibrillation [facility])    History of ablation - Dr. 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 She has never used smokeless tobacco. She reports that she does not drink alcohol or use drugs.   Allergies  Allergen Reactions   Amiodarone Other (See Comments)    Caused thyroid problems   Amoxicillin Hives   Aspirin Other (See Comments)    On tikosyn and warfarin   Codeine Other (See Comments)    Causes BP to drop   Darvocet [Propoxyphene N-Acetaminophen] Hives   Demerol Itching   Eliquis [Apixaban] Nausea Only   Penicillins Hives    Did it involve swelling of the face/tongue/throat, SOB, or low BP? Unknown Did it involve sudden or severe rash/hives, skin peeling, or any reaction on the inside of your mouth or nose? Unknown Did you need to seek medical attention at a hospital or doctor's office? Unknown When did it last happen?young adult or child?? If all above answers are NO, may proceed with cephalosporin  use.   Xarelto [Rivaroxaban] Rash    Family History  Problem Relation Age of Onset   Heart disease Father    Stroke Father    Heart attack Father    Heart disease Brother    Heart disease Brother    Hypertension Mother    Diabetes Sister    Heart attack Sister     Prior to Admission medications   Medication Sig Start Date End Date Taking? Authorizing Provider  acetaminophen (TYLENOL) 500 MG tablet Take 500 mg by mouth every 6 (six) hours as needed for moderate pain.    [provider]  dofetilide (TIKOSYN) 250 MCG capsule Take 250 mcg by mouth 2 (two) times daily.    [provider]  fludrocortisone (FLORINEF) 0.1 MG tablet Take 1 tablet (0.1 mg total) by mouth 2 (two) times daily. 05/21/18   [name], Peter M, MD  potassium chloride SA (K-DUR,KLOR-CON) 20 MEQ tablet Take 2 tablets (40 mEq total) by mouth daily. Patient taking differently: Take 20 mEq by mouth daily.  02/06/18   [name], Peter M, MD  QUEtiapine (SEROQUEL) 25 MG tablet 1/2 tablet in the morning, 1 tablet at night 06/06/18   Kathrynn Ducking, MD  rosuvastatin (CRESTOR) 10 MG tablet Take 1 tablet (10 mg total) by mouth daily. 04/29/18 07/28/18  [name], Peter M, MD  warfarin (COUMADIN) 4 MG tablet Take 1/2 to 1 tablet daily as directed by coumadin clinic Patient taking differently: Take 2-4 mg by mouth See admin instructions. Take 1 tablet (4 mg) by mouth on Monday, Wednesday, Friday, take 1/2 tablet (2 mg) on Sunday, Tuesday, Thursday, Saturday 12/28/17   [name], Peter M, MD   Physical Exam: Vitals:   07/01/18 1445 07/01/18 1500 07/01/18 1515 07/01/18 1530  BP: (!) 141/61 138/61 133/60 135/84  Pulse: (!) 59 60 60 69  Resp: 13 11 13 14   Temp:      TempSrc:      SpO2: 99% 99% 99% 100%  Weight:      Height:         General exam: Moderately built and nourished patient, lying comfortably supine on the gurney in no obvious distress.  Head, eyes and ENT: Nontraumatic and normocephalic. Pupils equally reacting to light and accommodation. Oral mucosa moist.  Neck: Supple. No JVD, carotid bruit or thyromegaly.  Lymphatics: No lymphadenopathy.  Respiratory system: Clear to auscultation. No increased work of breathing.  Cardiovascular system: S1 and S2 heard, RRR. No JVD, murmurs, gallops, clicks or pedal edema.  Gastrointestinal system: Abdomen is nondistended, soft and nontender. Normal bowel sounds heard. No organomegaly or masses appreciated.  Central nervous system: Alert and oriented.   Her speech was clear, no facial droop visualized, uvula central, tongue midline, bilateral lower extremity 4 out of 5, bilateral upper extremity 5/5, weaker left hand grip.  Is legally blind, she can only see light on her right eyes she does not have patient on the left  Extremities: Symmetric 5 x 5 power. Peripheral pulses symmetrically felt.   Skin: No rashes or acute findings.  Musculoskeletal system: Negative exam.  Psychiatry: Pleasant and cooperative.   Labs on Admission:  Basic Metabolic Panel: Recent Labs  Lab 07/01/18 1343  NA 142  K 4.0  CL 104  CO2 29  GLUCOSE 167*  BUN 20  CREATININE 1.33*  CALCIUM 8.7*   Liver Function Tests: Recent Labs  Lab 07/01/18 1343  AST 22  ALT 13  ALKPHOS 83  BILITOT 0.5  PROT 6.4*  ALBUMIN 3.8   No results for input(s): LIPASE, AMYLASE in the last 168 hours. No results for input(s): AMMONIA in the last 168 hours. CBC: Recent Labs  Lab 07/01/18 1343  WBC 4.6  NEUTROABS 2.8  HGB 13.6  HCT 42.2  MCV 97.5  PLT 131*   Cardiac Enzymes: No results for input(s): CKTOTAL, CKMB, CKMBINDEX, TROPONINI in the last 168 hours.  BNP (last 3 results) No results for input(s): PROBNP in the last 8760 hours. CBG: Recent Labs  Lab 07/01/18 1334  GLUCAP 159*    Radiological Exams on Admission: Ct Head Wo Contrast  Result Date: 07/01/2018 CLINICAL DATA:  Focal neuro deficit.  Left leg weakness. EXAM: CT HEAD WITHOUT CONTRAST TECHNIQUE: Contiguous axial images were obtained from the base of the skull through the vertex without intravenous contrast. COMPARISON:  CT head 06/01/2018 FINDINGS: Brain: Mild atrophy. Negative for acute infarct, hemorrhage, or mass. Mild chronic microvascular ischemic changes in the white matter Vascular: Negative for hyperdense vessel Skull: Negative Sinuses/Orbits: Prior ocular surgery on the left with hyperdense vitreous. No orbital mass. Other: None IMPRESSION: Atrophy and mild chronic microvascular ischemic  change. No acute abnormality. Electronically Signed   By: Franchot Gallo M.D.   On: 07/01/2018 15:12    EKG: Independently reviewed. Sinus rhythm.   Assessment/Plan Active Problems:   PAROXYSMAL ATRIAL FIBRILLATION   BRADYCARDIA-TACHYCARDIA SYNDROME   HYPOTENSION, ORTHOSTATIC   TIA (transient ischemic attack)   1-Stroke like symptoms;  Patient presents with right facial weakness, slurred speech, left lower extremity weakness. Her speech has improved, she does not have any right facial droop and my examination.  Bilateral lower extremity weak 4 out of 5. Patient will be admitted to rule out acute stroke.  Unable to perform MRI because he has a pacemaker. Discussed with neurology due to Low GFR we wont do CT Angie of head and neck. We will increase Crestor to 20 mg daily. Continue with Coumadin, pharmacy to dose. Will order echo, lipid panel, hemoglobin A1c. Carotid doppler.  Discussed with neurology: Hold coumadin dose for tonight, get CT head tomorrow, if no large stroke ok to resume coumadin.  Permissive HTN.   2-Orthostatic Hypotension chronic;  Continue with fluorine.   3-PAF; on coumadin, see problem 1.      DVT Prophylaxis: On Coumadin Code Status: DNR, discussed with patient Family Communication: We will contact daughter Disposition Plan; admit to the hospital under observation for further evaluation of stroke  Time spent: 75 minutes,   Elmarie Shiley MD [facility]   07/01/2018, 4:37 PM

## 2018-07-01 NOTE — ED Notes (Signed)
Patient transported to CT 

## 2018-07-01 NOTE — ED Provider Notes (Signed)
Emergency Department Provider Note   I have reviewed the triage vital signs and the nursing notes.   HISTORY  Chief Complaint No chief complaint on file.   HPI Katrina Rodriguez is a 85 y.o. female with PMH of A-fib, heart block with pacemaker, prior CVA, and Pulmonary HTN resents to the emergency department for evaluation of worsening left-sided weakness and speech change.  Last seen normal at 4:30 AM.  Patient states that she awoke and was feeling okay.  She ate breakfast and approximately 4:30 AM she had significant difficulty speaking.  She tells me that she was "slobbering" and feeling tingly and numb around her mouth.  She states that the symptoms have improved but she felt weakness in the left leg.  She had a stroke 1 year prior and notes a history of the left arm and leg weakness at baseline.  She states that they feel more weak since this morning.  She worked with her physical therapist at home today who noted significant worsening weakness on the left compared to her baseline and advised that she present to the emergency department.  Patient denies any headache.  No fevers or chills.  No respiratory symptoms.  She has been compliant with her medications.  She tells me that her pacemaker is not MRI compliant.    Past Medical History:  Diagnosis Date   Arthritis    Atrial fibrillation [facility])    History of ablation - Dr. 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   Orthostasis    Retinal detachment    Thrombocytopenia ([place])    TIA (transient ischemic attack)     Patient Active Problem List   Diagnosis Date Noted   TIA (transient ischemic attack) 07/01/2018    Adjustment disorder with anxious mood 05/26/2018   Hypothyroidism 06/19/2017   Left-sided weakness 06/19/2017   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   Encounter for therapeutic drug monitoring 06/09/2013   Complete heart block ([place]) 06/09/2013   Syncope 04/24/2013   H/O    PAROXYSMAL ATRIAL FIBRILLATION 02/02/2010   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   DEEP VENOUS THROMBOPHLEBITIS 09/17/2008   HYPOTENSION, ORTHOSTATIC 09/17/2008   SYNCOPE 09/17/2008   CHEST PAIN 09/17/2008   PPM-Medtronic 09/17/2008    Past Surgical History:  Procedure Laterality Date   CARDIAC CATHETERIZATION     CARDIAC PACEMAKER PLACEMENT     PERMANENT PACEMAKER GENERATOR CHANGE N/A 06/12/2012   Procedure: PERMANENT PACEMAKER GENERATOR CHANGE;  Surgeon: Evans Lance, MD;  Location: [facility] CATH LAB;  Service: Cardiovascular;  Laterality: N/A;   RETINAL DETACHMENT SURGERY      Allergies Amiodarone; Amoxicillin; Aspirin; Codeine; Darvocet [propoxyphene n-acetaminophen]; Demerol; Eliquis [apixaban]; Penicillins; and Xarelto [rivaroxaban]  Family History  Problem Relation Age of Onset   Heart disease Father    Stroke Father    Heart attack Father    Heart disease Brother    Heart disease Brother    Hypertension Mother    Diabetes Sister    Heart attack Sister     Social History Social History   Tobacco Use   Smoking status: Never Smoker   Smokeless tobacco: Never Used  Substance Use Topics   Alcohol use: Never    Frequency: Never   Drug use: Never    Review of Systems  Constitutional: No fever/chills Eyes: Blindness at baseline.  ENT: No sore throat. Cardiovascular: Denies chest pain. Respiratory: Denies shortness of breath. Gastrointestinal: No abdominal pain.  No nausea, no vomiting.  No diarrhea.  No constipation. Genitourinary: Negative for dysuria. Musculoskeletal: Negative for back pain. Skin: Negative for rash. Neurological: Negative for  headaches. Speech change, word-finding difficulty, and worsening left arm/leg weakness.   10-point ROS otherwise negative.  ____________________________________________   PHYSICAL EXAM:  VITAL SIGNS: ED Triage Vitals  Enc Vitals Group     BP 07/01/18 1317 135/67     Pulse Rate 07/01/18 1317 73     Resp 07/01/18 1317 16     Temp 07/01/18 1317 97.9 F (36.6 C)     Temp Source 07/01/18 1317 Oral     SpO2 07/01/18 1317 98 %     Weight 07/01/18 1338 111 lb (50.3 kg)     Height 07/01/18 1338 5\' 4"  (1.626 m)     Pain Score 07/01/18 1341 0   Constitutional: Alert and oriented. Well appearing and in no acute distress. Eyes: Conjunctivae are normal. Light sensing vision only on the right. Complete blindness on the left.  Head: Atraumatic. Mouth/Throat: Mucous membranes are moist.  Neck: No stridor.   Cardiovascular: Normal rate, regular rhythm. Good peripheral circulation. Grossly normal heart sounds.   Respiratory: Normal respiratory effort.  No retractions. Lungs CTAB. Gastrointestinal: Soft and nontender. No distention.  Musculoskeletal: No lower extremity tenderness nor edema. No gross deformities of extremities. Neurologic:  Normal speech and language. 4/5 weakness in the LUE and LLE. Normal sensation. Decreased grip strength on the left. Slight facial droop noted on the left as well.  Skin:  Skin is warm, dry and intact. No rash noted.  ____________________________________________   LABS (all labs ordered are listed, but only abnormal results are displayed)  Labs Reviewed  PROTIME-INR - Abnormal; Notable for the following components:      Result Value   Prothrombin Time 23.7 (*)    INR 2.2 (*)    All other components within normal limits  APTT - Abnormal; Notable for the following components:   aPTT 37 (*)    All other components within normal limits  CBC - Abnormal; Notable for the following components:   Platelets 131 (*)    All other components within normal limits    COMPREHENSIVE METABOLIC PANEL - Abnormal; Notable for the following components:   Glucose, Bld 167 (*)    Creatinine, Ser 1.33 (*)    Calcium 8.7 (*)    Total Protein 6.4 (*)    GFR calc non Af Amer 36 (*)    GFR calc Af Amer 41 (*)    All other components within normal limits  URINALYSIS, ROUTINE W REFLEX MICROSCOPIC - Abnormal; Notable for the following components:   Hgb urine dipstick MODERATE (*)    Leukocytes,Ua SMALL (*)    All other components within normal limits  CBG MONITORING, ED - Abnormal; Notable for the following components:   Glucose-Capillary 159 (*)    All other components within normal limits  DIFFERENTIAL  RAPID URINE DRUG SCREEN, HOSP PERFORMED  ETHANOL   ____________________________________________  EKG   EKG Interpretation  Date/Time:  Monday July 01 2018 13:35:08 EDT Ventricular Rate:  74 PR Interval:    QRS Duration: 85 QT Interval:  439 QTC Calculation: 488 R  Axis:   42 Text Interpretation:  Sinus rhythm Borderline T abnormalities, anterior leads Borderline prolonged QT interval No STEMI.  Confirmed by Nanda Quinton [phone]) on 07/01/2018 2:10:59 PM       ____________________________________________  RADIOLOGY  Ct Head Wo Contrast  Result Date: 07/01/2018 CLINICAL DATA:  Focal neuro deficit.  Left leg weakness. EXAM: CT HEAD WITHOUT CONTRAST TECHNIQUE: Contiguous axial images were obtained from the base of the skull through the vertex without intravenous contrast. COMPARISON:  CT head 06/01/2018 FINDINGS: Brain: Mild atrophy. Negative for acute infarct, hemorrhage, or mass. Mild chronic microvascular ischemic changes in the white matter Vascular: Negative for hyperdense vessel Skull: Negative Sinuses/Orbits: Prior ocular surgery on the left with hyperdense vitreous. No orbital mass. Other: None IMPRESSION: Atrophy and mild chronic microvascular ischemic change. No acute abnormality. Electronically Signed   By: Franchot Gallo M.D.   On: 07/01/2018  15:12    ____________________________________________   PROCEDURES  Procedure(s) performed:   Procedures  None  ____________________________________________   INITIAL IMPRESSION / ASSESSMENT AND PLAN / ED COURSE  Pertinent labs & imaging results that were available during my care of the patient were reviewed by me and considered in my medical decision making (see chart for details).   Presents to the emergency department with strokelike symptoms at 4:30 AM.  Has weakness in the left upper and lower extremities without sensory deficit.  Slight left facial droop.  Patient tells me that she has weakness on the side normally.  In prior ED notes this is not documented.  Apparently her physical therapist felt like this was worse.   Spoke with Dr. Londell Moh who will consult but agrees with admit given deficits on exam. Patient cannot get MRI due to pacemaker. Labs and imaging reviewed.   Discussed patient's case with Hospitalist to request admission. Patient and family (if present) updated with plan. Care transferred to Hospitalist service.  I reviewed all nursing notes, vitals, pertinent old records, EKGs, labs, imaging (as available).  ____________________________________________  FINAL CLINICAL IMPRESSION(S) / ED DIAGNOSES  Final diagnoses:  Stroke-like symptoms    Note:  This document was prepared using Dragon voice recognition software and may include unintentional dictation errors.  Nanda Quinton, MD Emergency Medicine    Leyana Whidden, [facility], MD 07/01/18 1524

## 2018-07-02 ENCOUNTER — Observation Stay (HOSPITAL_BASED_OUTPATIENT_CLINIC_OR_DEPARTMENT_OTHER): Payer: Medicare Other

## 2018-07-02 ENCOUNTER — Observation Stay (HOSPITAL_COMMUNITY): Payer: Medicare Other

## 2018-07-02 DIAGNOSIS — I480 Paroxysmal atrial fibrillation: Secondary | ICD-10-CM | POA: Diagnosis not present

## 2018-07-02 DIAGNOSIS — I4819 Other persistent atrial fibrillation: Secondary | ICD-10-CM | POA: Diagnosis not present

## 2018-07-02 DIAGNOSIS — I639 Cerebral infarction, unspecified: Secondary | ICD-10-CM

## 2018-07-02 DIAGNOSIS — R51 Headache: Secondary | ICD-10-CM | POA: Diagnosis not present

## 2018-07-02 DIAGNOSIS — R2990 Unspecified symptoms and signs involving the nervous system: Secondary | ICD-10-CM | POA: Diagnosis not present

## 2018-07-02 LAB — CBC
HCT: 35.9 % — ABNORMAL LOW (ref 36.0–46.0)
Hemoglobin: 12 g/dL (ref 12.0–15.0)
MCH: 31.7 pg (ref 26.0–34.0)
MCHC: 33.4 g/dL (ref 30.0–36.0)
MCV: 95 fL (ref 80.0–100.0)
Platelets: 118 10*3/uL — ABNORMAL LOW (ref 150–400)
RBC: 3.78 MIL/uL — ABNORMAL LOW (ref 3.87–5.11)
RDW: 13.3 % (ref 11.5–15.5)
WBC: 4.1 10*3/uL (ref 4.0–10.5)
nRBC: 0 % (ref 0.0–0.2)

## 2018-07-02 LAB — LIPID PANEL
Cholesterol: 137 mg/dL (ref 0–200)
HDL: 58 mg/dL (ref >40)
LDL Cholesterol: 64 mg/dL (ref 0–99)
Total CHOL/HDL Ratio: 2.4 RATIO
Triglycerides: 75 mg/dL (ref <150)
VLDL: 15 mg/dL (ref 0–40)

## 2018-07-02 LAB — PROTIME-INR
INR: 2.5 — ABNORMAL HIGH (ref 0.8–1.2)
Prothrombin Time: 26.4 seconds — ABNORMAL HIGH (ref 11.4–15.2)

## 2018-07-02 LAB — ECHOCARDIOGRAM LIMITED BUBBLE STUDY
Height: 64 in
Weight: 1775.99 oz

## 2018-07-02 LAB — HEMOGLOBIN A1C
Hgb A1c MFr Bld: 5.6 % (ref 4.8–5.6)
Mean Plasma Glucose: 114.02 mg/dL

## 2018-07-02 MED ORDER — WARFARIN SODIUM 2 MG PO TABS
2.0000 mg | ORAL_TABLET | Freq: Once | ORAL | Status: DC
  Filled 2018-07-02: qty 1

## 2018-07-02 MED ORDER — WARFARIN - PHARMACIST DOSING INPATIENT: Freq: Every day | Status: DC

## 2018-07-02 MED ORDER — DOFETILIDE 250 MCG PO CAPS
250.0000 ug | ORAL_CAPSULE | Freq: Two times a day (BID) | ORAL | Status: DC
  Administered 2018-07-02 – 2018-07-03 (×2): 250 ug via ORAL
  Filled 2018-07-02 (×4): qty 1

## 2018-07-02 NOTE — Evaluation (Signed)
Speech Language Pathology Evaluation Patient Details Name: Katrina Rodriguez MRN: [id number] DOB: 1928/12/27 Today's Date: 07/02/2018 Time: 28-     Problem List:  Patient Active Problem List   Diagnosis Date Noted  . TIA (transient ischemic attack) 07/01/2018  . Adjustment disorder with anxious mood 05/26/2018  . Hypothyroidism 06/19/2017  . Left-sided weakness 06/19/2017  . 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 Encounter for therapeutic drug monitoring 06/09/2013  . Complete heart block ([place]) 06/09/2013  . Syncope 04/24/2013  . H/O   . PAROXYSMAL ATRIAL FIBRILLATION 02/02/2010  . 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 DEEP VENOUS THROMBOPHLEBITIS 09/17/2008  . HYPOTENSION, ORTHOSTATIC 09/17/2008  . SYNCOPE 09/17/2008  . CHEST PAIN 09/17/2008  . PPM-Medtronic 09/17/2008   Past Medical History:  Past Medical History:  Diagnosis Date  . Arthritis   . Atrial fibrillation ([facility])    History of ablation - Dr. Rayann Heman  . Chest pain    Has normal coronaries per cath in 2005; negative nuclear in 2010  . Chronic anemia   . Complete heart block ([place]) 06/09/2013  . H/O: CVA (cardiovascular accident)    Right internal capsule  . Hypothyroidism 06/19/2017  . Left-sided weakness 06/19/2017  . Mild pulmonary hypertension ([place])   . 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 Orthostasis   . Retinal detachment   . Thrombocytopenia ([place])   . TIA (transient ischemic attack)    Past Surgical History:  Past Surgical History:  Procedure Laterality Date  . ATRIAL FIBRILLATION ABLATION    . CARDIAC CATHETERIZATION    . CARDIAC PACEMAKER PLACEMENT    . PERMANENT PACEMAKER GENERATOR CHANGE N/A 06/12/2012   Procedure: PERMANENT PACEMAKER GENERATOR CHANGE;  Surgeon: Evans Lance, MD;  Location: [facility] CATH LAB;  Service: Cardiovascular;  Laterality: N/A;  . RETINAL DETACHMENT SURGERY     HPI:  Pt is a 85 y.o. female with Afib on coumadin, post ablation, complete heart block S/P pacemaker, CVA,  right eye optic atrophy, retinal detachment, hypothyroidism, and history of TIA who presented complaining of right facial numbness, weakness, slurred speech and left lower extremity weakness that is started on 07/01/18. CT of the head from 4/27 and 4/28 were negative.    Assessment / Plan / Recommendation Clinical Impression  Pt denied any baseline deficits in speech, language, or cognition but stated that she recently hired a 24-hour caregiver since a recent fall. Her speech and language skills are currently within normal limits and no overt cognitive deficits were noted. Further skilled SLP services are not clinically indicated at this time. Pt, and nursing were educated regarding this; both all parties verbalized understanding as well as agreement with plan of care.    SLP Assessment  SLP Recommendation/Assessment: Patient does not need any further Speech Lanaguage Pathology Services SLP Visit Diagnosis: Cognitive communication deficit (R41.841)    Follow Up Recommendations  None    Frequency and Duration           SLP Evaluation Cognition  Overall Cognitive Status: Within Functional Limits for tasks assessed Arousal/Alertness: Awake/alert Orientation Level: Oriented X4 Attention: Focused;Sustained Focused Attention: Appears intact Sustained Attention: Appears intact Memory: Appears intact(Immediate: 3/3; Delayed; 1/3; 2/2 with cues) Awareness: Appears intact Problem Solving: Appears intact(4/4) Executive Function: Reasoning;Sequencing Reasoning: Appears intact Sequencing: Appears intact       Comprehension  Auditory Comprehension Overall Auditory Comprehension: Appears within functional limits for tasks assessed Yes/No Questions: Within Functional Limits(Simple & Complex: 5/5) Commands: Within Functional Limits Conversation: Complex Visual Recognition/Discrimination Discrimination:  Not tested(Due to visual deficits) Reading Comprehension Reading Status: Not tested(due to  visual deficits)    Expression Expression Primary Mode of Expression: Verbal Verbal Expression Overall Verbal Expression: Appears within functional limits for tasks assessed Initiation: No impairment Automatic Speech: Counting;Day of week;Month of year Level of Generative/Spontaneous Verbalization: Conversation Repetition: No impairment(5/5) Naming: No impairment(Responsive: 5/5) Pragmatics: No impairment   Oral / Motor  Oral Motor/Sensory Function Overall Oral Motor/Sensory Function: Within functional limits Motor Speech Overall Motor Speech: Appears within functional limits for tasks assessed Respiration: Within functional limits Phonation: Normal Resonance: Within functional limits Articulation: Within functional limitis Intelligibility: Intelligible Motor Planning: Witnin functional limits Motor Speech Errors: Not applicable   Kinga Cassar I. Hardin Negus, [place], [place] Office number [phone] Pager [place] 07/02/2018, 11:13 AM

## 2018-07-02 NOTE — Progress Notes (Signed)
SLP Cancellation Note  Patient Details Name: Katrina Rodriguez MRN: [id number] DOB: 1928/07/24   Cancelled treatment:       Reason Eval/Treat Not Completed: Patient at procedure or test/unavailable(Pt off unit for CT. SLP will follow up. )  Neale Marzette I. Hardin Negus, [place], [place] Office number [phone] Pager [place] 07/02/2018, 8:58 AM

## 2018-07-02 NOTE — Progress Notes (Addendum)
ANTICOAGULATION CONSULT NOTE - Initial Consult  Pharmacy Consult for warfarin>>apixaban Indication: atrial fibrillation  Allergies  Allergen Reactions  . Amiodarone Other (See Comments)    Caused thyroid problems  . Amoxicillin Hives  . Aspirin Other (See Comments)    On tikosyn and warfarin  . Codeine Other (See Comments)    Causes BP to drop  . Darvocet [Propoxyphene N-Acetaminophen] Hives  . Demerol Itching  . Eliquis [Apixaban] Nausea Only  . Penicillins Hives    Did it involve swelling of the face/tongue/throat, SOB, or low BP? Unknown Did it involve sudden or severe rash/hives, skin peeling, or any reaction on the inside of your mouth or nose? Unknown Did you need to seek medical attention at a hospital or doctor's office? Unknown When did it last happen?young adult or child?? If all above answers are "NO", may proceed with cephalosporin use.  Alveda Reasons [Rivaroxaban] Rash    Patient Measurements: Height: 5\' 4"  (162.6 cm) Weight: 111 lb (50.3 kg) IBW/kg (Calculated) : 54.7  Vital Signs: Temp: 97.8 F (36.6 C) (04/28 0814) Temp Source: Oral (04/28 0814) BP: 156/83 (04/28 0814) Pulse Rate: 60 (04/28 0814)  Labs: Recent Labs    07/01/18 1343 07/02/18 0400  HGB 13.6 12.0  HCT 42.2 35.9*  PLT 131* 118*  APTT 37*  --   LABPROT 23.7* 26.4*  INR 2.2* 2.5*  CREATININE 1.33*  --     Estimated Creatinine Clearance: 23.2 mL/min (A) (by C-G formula based on SCr of 1.33 mg/dL (H)).   Medical History: Past Medical History:  Diagnosis Date  . Arthritis   . Atrial fibrillation ([facility])    History of ablation - Dr. Rayann Heman  . Chest pain    Has normal coronaries per cath in 2005; negative nuclear in 2010  . Chronic anemia   . Complete heart block ([place]) 06/09/2013  . H/O: CVA (cardiovascular accident)    Right internal capsule  . Hypothyroidism 06/19/2017  . Left-sided weakness 06/19/2017  . Mild pulmonary hypertension ([facility])   . Optic atrophy of right eye  01/18/2015  . Orthostasis   . Retinal detachment   . Thrombocytopenia ([place])   . TIA (transient ischemic attack)    Assessment: 85yo female presents with right facial numbness, weakness, slurred speech and left lower extremity weakness. PMH includes atrial fibrillation on coumadin, complete heart block s/p pacemaker, CVA right eye optic atrophy, retinal detachment, and h/o TIA. Pharmacy consulted to start warfarin repeat after CT head scheduled for 4/28. INR on admission therapeutic at 2.2. H/H 13.6/42.2. Head CT negative.  INR 2.5 today. Will continue home dose of warfarin (2mg  today). Patient will likely go home today per MD. Would continue home dosing regimen.  Warfarin dose PTA: 4mg  MWF and 2 mg on all other days with last dose taken on 4/27 PTA  Goal of Therapy:  INR 2-3 Monitor platelets by anticoagulation protocol: Yes   Plan:  Warfarin 2mg  PO x 1 tonight if not discharged.   Monitor daily INR, CBCs, and s/sx of bleeding  Dianna Deshler A. Levada Dy, PharmD, [place] Please utilize Amion for appropriate phone number to reach the unit pharmacist ([place])   Addendum:  Neurology is recommending a change to apixaban for atrial fibrillation from warfarin. Patient >61 yo and weight <60kg therefore will start apixaban 2.5mg  BID once INR <2 (INR 2.5 today). CM working on prior approval.   Plan : D/C warfarin Apixaban 2.5mg  BID once INR <2 Will f/u INR in AM  Ansen Sayegh A. Levada Dy, PharmD, [place] Please utilize Amion for appropriate phone number to reach the unit pharmacist ([place])    07/02/2018   11:43 AM

## 2018-07-02 NOTE — Evaluation (Signed)
Physical Therapy Evaluation Patient Details Name: Katrina Rodriguez MRN: [id number] DOB: Aug 30, 1928 Today's Date: 07/02/2018   History of Present Illness  Pt is an 85 y.o. female with a PMH significant for Afib on coumadin s/p ablation, complete heart block S/P pacemaker, R CVA, right eye optic atrophy, retinal detachment, legally blind, hypothyroidism, who presents complaining of right facial numbness, weakness, slurred speech and left LE weakness that started the morning of admission.  Pt presenting to the emergency department today due to increased left-sided LE weakness, transient dysarthria and paresthesia of the left side of the mouth.  Unfortunately patient cannot have an MRI secondary to pacemaker and CT of head did not show any acute infarct.  Clinical Impression  Pt admitted with above diagnosis. Pt currently with functional limitations due to the deficits listed below (see PT Problem List). At the time of PT eval pt was able to perform transfers and ambulation with up to min assist for balance support, safety, and occasional walker management (likely due to low vision). Noted pt with mild L side inattention but was able to assist her LLE up into bed at end of session without being cued to do so. Pt reports she feels she is near baseline of function. Pt will benefit from skilled PT to increase their independence and safety with mobility to allow discharge to the venue listed below.       Follow Up Recommendations Home health PT    Equipment Recommendations       Recommendations for Other Services       Precautions / Restrictions Precautions Precautions: Fall Restrictions Weight Bearing Restrictions: No      Mobility  Bed Mobility Overal bed mobility: Needs Assistance Bed Mobility: Supine to Sit;Sit to Supine     Supine to sit: Supervision;HOB elevated Sit to supine: Min guard   General bed mobility comments: Increased time but pt able to complete without assistance. Min guard  for elevating LE's up into bed however pt was able to complete by lifting LLE up with her hands.   Transfers Overall transfer level: Needs assistance Equipment used: 1 person hand held assist Transfers: Sit to/from Stand Sit to Stand: Min guard;Min assist         General transfer comment: Close guard for power-up to full stand from EOB, however at end of session pt required mild min assist for power-up from low commode height in room.   Ambulation/Gait Ambulation/Gait assistance: Min guard;Min assist Gait Distance (Feet): 175 Feet Assistive device: Rolling walker (2 wheeled) Gait Pattern/deviations: Step-through pattern;Decreased stride length;Trunk flexed;Narrow base of support Gait velocity: Decreased Gait velocity interpretation: <1.8 ft/sec, indicate of risk for recurrent falls General Gait Details: Pt ambulating very slow and with very small step/stride length. Overall pt requires min guard assist with occasional min assist for balance support and walker management (due to low vision).   Stairs            Wheelchair Mobility    Modified Rankin (Stroke Patients Only) Modified Rankin (Stroke Patients Only) Pre-Morbid Rankin Score: Slight disability Modified Rankin: Moderately severe disability     Balance Overall balance assessment: Needs assistance Sitting-balance support: Feet unsupported;No upper extremity supported Sitting balance-Leahy Scale: Fair Sitting balance - Comments: Pt reaching down while sitting to show therapist her scar under her sock on the L side.    Standing balance support: No upper extremity supported;During functional activity Standing balance-Leahy Scale: Poor Standing balance comment: Occasional mild assist for balance when standing at the sink  to wash her hands.                              Pertinent Vitals/Pain Pain Assessment: No/denies pain    Home Living Family/patient expects to be discharged to:: Private  residence Living Arrangements: Other (Comment)(Aide) Available Help at Discharge: Personal care attendant;Available 24 hours/day Type of Home: ([facility]) Home Access: Stairs to enter Entrance Stairs-Rails: Left Entrance Stairs-Number of Steps: 2 Home Layout: Two level(Has chair lift) Home Equipment: Walker - 2 wheels;Walker - 4 wheels;Bedside commode;Tub bench;Grab bars - tub/shower Additional Comments: PTA pt was working with HHPT and [place] had recommended additonal grab bars in bathroom.  Pt currently not using BSC but states she can "if I need to"    Prior Function Level of Independence: Independent with assistive device(s)               Hand Dominance   Dominant Hand: Right    Extremity/Trunk Assessment   Upper Extremity Assessment Upper Extremity Assessment: Defer to OT evaluation LUE Deficits / Details: Pt L inattention to LUE and LLE, with cues has ROM WFL's for basic self care  (limited in overhead reach).  Pt with decreased strength LUE of 3+/5  was working with HHOT PTA due to residual L sided weakness from prior stroke and falls. Pt states she feels her LUE and LE's are weaker than they were before.  LUE Sensation: decreased light touch LUE Coordination: decreased fine motor    Lower Extremity Assessment Lower Extremity Assessment: LLE deficits/detail LLE Deficits / Details: Prior L ankle injury (78 years ago) that has limited overall ROM at the ankle. Pt reports it hurts her still. Noted gross weakness compared to R LE and some gross L side inattention at times.    Cervical / Trunk Assessment Cervical / Trunk Assessment: Kyphotic  Communication   Communication: HOH  Cognition Arousal/Alertness: Awake/alert Behavior During Therapy: WFL for tasks assessed/performed Overall Cognitive Status: Within Functional Limits for tasks assessed                                 General Comments: Pt very independent however is aware of risks.  Pt has allowed  dtr to hire live in companion who can assist as needed      General Comments      Exercises     Assessment/Plan    PT Assessment Patient needs continued PT services  PT Problem List Decreased strength;Decreased activity tolerance;Decreased range of motion;Decreased balance;Decreased mobility;Decreased knowledge of use of DME;Decreased safety awareness;Decreased knowledge of precautions       PT Treatment Interventions DME instruction;Gait training;Stair training;Functional mobility training;Therapeutic activities;Therapeutic exercise;Neuromuscular re-education;Patient/family education    PT Goals (Current goals can be found in the Care Plan section)  Acute Rehab PT Goals Patient Stated Goal: Be able to walk to her mailbox and back  PT Goal Formulation: With patient/family Time For Goal Achievement: 07/16/18 Potential to Achieve Goals: Good    Frequency Min 4X/week   Barriers to discharge        Co-evaluation               AM-PAC PT "6 Clicks" Mobility  Outcome Measure Help needed turning from your back to your side while in a flat bed without using bedrails?: None Help needed moving from lying on your back to sitting on the side of a  flat bed without using bedrails?: None Help needed moving to and from a bed to a chair (including a wheelchair)?: A Little Help needed standing up from a chair using your arms (e.g., wheelchair or bedside chair)?: A Little Help needed to walk in hospital room?: A Little Help needed climbing 3-5 steps with a railing? : A Little 6 Click Score: 20    End of Session Equipment Utilized During Treatment: Gait belt Activity Tolerance: Patient tolerated treatment well Patient left: in bed;with call bell/phone within reach;Other (comment)(Vascular Lab tech present at end of session) Nurse Communication: Mobility status PT Visit Diagnosis: Unsteadiness on feet (R26.81);Other symptoms and signs involving the nervous system (R29.898)    Time:  6384-5364 PT Time Calculation (min) (ACUTE ONLY): 43 min   Charges:   PT Evaluation $PT Eval Moderate Complexity: 1 Mod PT Treatments $Gait Training: 23-37 mins        Rolinda Roan, PT, DPT Acute Rehabilitation Services Pager: [phone] Office: [phone]   Thelma Comp 07/02/2018, 1:17 PM

## 2018-07-02 NOTE — Progress Notes (Addendum)
Progress Note    Katrina Rodriguez  QVZ:[id number] DOB: Sep 28, 1928  DOA: 07/01/2018 PCP: Leighton Ruff, MD    Brief Narrative:     Medical records reviewed and are as summarized below:  Katrina Rodriguez is an 85 y.o. female with  Afib on coumadin, post ablation,  Complete heart block S/P pacemaker, CVA right eye optic atrophy, retinal detachment, hypothyroidism, history of TIA who presents complaining of right facial numbness, weakness, slurred speech and left lower extremity weakness that is started the morning of admission.  Patient wake up around 4:30 AM to eat ice cream, when she noticed numbness in the right side of her face and her speech was not right.  She also noticed some left lower extremity weakness.  Patient later during the morning, was working with her physical therapist who also noticed worsening left lower extremity weakness.  On arrival to the hospital patienthas improved, she was noted to have a mild facial droop.   Assessment/Plan:   Active Problems:   PAROXYSMAL ATRIAL FIBRILLATION   BRADYCARDIA-TACHYCARDIA SYNDROME   HYPOTENSION, ORTHOSTATIC   TIA (transient ischemic attack)  Stroke like symptoms- CVA suspected - Unable to perform MRI because she has a pacemaker. - due to Low GFR we wont do CT Angie of head and neck. -LDL: 64 -coumadin to change to eliquis if affordable-- prior authorization requested -carotids negative  Orthostatic Hypotension chronic;  Continue with fluorine.   PAF; on coumadin-- Dr. Leonie Man suggests changing to eliquis -resume tikosyn  Family Communication/Anticipated D/C date and plan/Code Status   DVT prophylaxis: coumadin Code Status: dnr Family Communication: called betty- no answer Disposition Plan: ? Change coumadin to eliquis-- await prior authorization   Medical Consultants:    neurology  Subjective:   C/o numbness around her entire mouth  Objective:    Vitals:   07/02/18 0300 07/02/18 0425 07/02/18 0814 07/02/18  1258  BP: (!) 114/48 136/65 (!) 156/83 139/65  Pulse: (!) 57 (!) 55 60 61  Resp: 16 16 15 15   Temp: 98.5 F (36.9 C) 98.1 F (36.7 C) 97.8 F (36.6 C) (!) 97.5 F (36.4 C)  TempSrc: Oral Oral Oral Oral  SpO2: 96% 97% 98% 100%  Weight:      Height:        Intake/Output Summary (Last 24 hours) at 07/02/2018 1407 Last data filed at 07/02/2018 0600 Gross per 24 hour  Intake 550 ml  Output --  Net 550 ml   Filed Weights   07/01/18 1338  Weight: 50.3 kg    Exam: In bed, NAD Pleasant and cooperative irr No LE edema  Data Reviewed:   I have personally reviewed following labs and imaging studies:  Labs: Labs show the following:   Basic Metabolic Panel: Recent Labs  Lab 07/01/18 1343  NA 142  K 4.0  CL 104  CO2 29  GLUCOSE 167*  BUN 20  CREATININE 1.33*  CALCIUM 8.7*   GFR Estimated Creatinine Clearance: 23.2 mL/min (A) (by C-G formula based on SCr of 1.33 mg/dL (H)). Liver Function Tests: Recent Labs  Lab 07/01/18 1343  AST 22  ALT 13  ALKPHOS 83  BILITOT 0.5  PROT 6.4*  ALBUMIN 3.8   No results for input(s): LIPASE, AMYLASE in the last 168 hours. No results for input(s): AMMONIA in the last 168 hours. Coagulation profile Recent Labs  Lab 07/01/18 1343 07/02/18 0400  INR 2.2* 2.5*    CBC: Recent Labs  Lab 07/01/18 1343 07/02/18 0400  WBC  4.6 4.1  NEUTROABS 2.8  --   HGB 13.6 12.0  HCT 42.2 35.9*  MCV 97.5 95.0  PLT 131* 118*   Cardiac Enzymes: No results for input(s): CKTOTAL, CKMB, CKMBINDEX, TROPONINI in the last 168 hours. BNP (last 3 results) No results for input(s): PROBNP in the last 8760 hours. CBG: Recent Labs  Lab 07/01/18 1334  GLUCAP 159*   D-Dimer: No results for input(s): DDIMER in the last 72 hours. Hgb A1c: Recent Labs    07/02/18 0400  HGBA1C 5.6   Lipid Profile: Recent Labs    07/02/18 0400  CHOL 137  HDL 58  LDLCALC 64  TRIG 75  CHOLHDL 2.4   Thyroid function studies: No results for input(s):  TSH, T4TOTAL, T3FREE, THYROIDAB in the last 72 hours.  Invalid input(s): FREET3 Anemia work up: No results for input(s): VITAMINB12, FOLATE, FERRITIN, TIBC, IRON, RETICCTPCT in the last 72 hours. Sepsis Labs: Recent Labs  Lab 07/01/18 1343 07/02/18 0400  WBC 4.6 4.1    Microbiology No results found for this or any previous visit (from the past 240 hour(s)).  Procedures and diagnostic studies:  Ct Head Wo Contrast  Result Date: 07/02/2018 CLINICAL DATA:  Headache, left-sided weakness. EXAM: CT HEAD WITHOUT CONTRAST TECHNIQUE: Contiguous axial images were obtained from the base of the skull through the vertex without intravenous contrast. COMPARISON:  CT scan of July 01, 2018. FINDINGS: Brain: Mild chronic ischemic white matter disease is noted. Minimal diffuse cortical atrophy. No mass effect or midline shift is noted. Ventricular size is within normal limits. There is no evidence of mass lesion, hemorrhage or acute infarction. Vascular: No hyperdense vessel or unexpected calcification. Skull: Normal. Negative for fracture or focal lesion. Sinuses/Orbits: No acute finding. Other: None. IMPRESSION: Mild chronic ischemic white matter disease. Minimal diffuse cortical atrophy. No acute intracranial abnormality seen. Electronically Signed   By: Marijo Conception M.D.   On: 07/02/2018 09:06   Ct Head Wo Contrast  Result Date: 07/01/2018 CLINICAL DATA:  Focal neuro deficit.  Left leg weakness. EXAM: CT HEAD WITHOUT CONTRAST TECHNIQUE: Contiguous axial images were obtained from the base of the skull through the vertex without intravenous contrast. COMPARISON:  CT head 06/01/2018 FINDINGS: Brain: Mild atrophy. Negative for acute infarct, hemorrhage, or mass. Mild chronic microvascular ischemic changes in the white matter Vascular: Negative for hyperdense vessel Skull: Negative Sinuses/Orbits: Prior ocular surgery on the left with hyperdense vitreous. No orbital mass. Other: None IMPRESSION: Atrophy and  mild chronic microvascular ischemic change. No acute abnormality. Electronically Signed   By: Franchot Gallo M.D.   On: 07/01/2018 15:12   Vas US Carotid  Result Date: 07/02/2018 Carotid Arterial Duplex Study Indications:       CVA. Comparison Study:  06/20/17 - 1-39% ICA stenosis bilaterally Performing Technologist: Oliver Hum RVT  Examination Guidelines: A complete evaluation includes B-mode imaging, spectral Doppler, color Doppler, and power Doppler as needed of all accessible portions of each vessel. Bilateral testing is considered an integral part of a complete examination. Limited examinations for reoccurring indications may be performed as noted.  Right Carotid Findings: +----------+--------+--------+--------+-----------------------+--------+             PSV cm/s EDV cm/s Stenosis Describe                Comments  +----------+--------+--------+--------+-----------------------+--------+  CCA Prox   120      8                 smooth and heterogenous  tortuous  +----------+--------+--------+--------+-----------------------+--------+  CCA Distal 57       11                smooth and heterogenous           +----------+--------+--------+--------+-----------------------+--------+  ICA Prox   39       8                 smooth and heterogenous           +----------+--------+--------+--------+-----------------------+--------+  ICA Distal 72       10                                        tortuous  +----------+--------+--------+--------+-----------------------+--------+  ECA        86       5                                                   +----------+--------+--------+--------+-----------------------+--------+ +----------+--------+-------+--------+-------------------+             PSV cm/s EDV cms Describe Arm Pressure (mmHG)  +----------+--------+-------+--------+-------------------+  Subclavian 109                                            +----------+--------+-------+--------+-------------------+  +---------+--------+--+--------+-+---------+  Vertebral PSV cm/s 46 EDV cm/s 4 Antegrade  +---------+--------+--+--------+-+---------+  Left Carotid Findings: +----------+--------+-------+--------+--------------------------------+--------+             PSV cm/s EDV     Stenosis Describe                         Comments                       cm/s                                                        +----------+--------+-------+--------+--------------------------------+--------+  CCA Prox   73       8                smooth and heterogenous                    +----------+--------+-------+--------+--------------------------------+--------+  CCA Distal 74       12               smooth and heterogenous                    +----------+--------+-------+--------+--------------------------------+--------+  ICA Prox   75       13               smooth, heterogenous and  calcific                                   +----------+--------+-------+--------+--------------------------------+--------+  ICA Distal 65       18                                                tortuous  +----------+--------+-------+--------+--------------------------------+--------+  ECA        111      0                                                           +----------+--------+-------+--------+--------------------------------+--------+ +----------+--------+--------+--------+-------------------+  Subclavian PSV cm/s EDV cm/s Describe Arm Pressure (mmHG)  +----------+--------+--------+--------+-------------------+             91                                              +----------+--------+--------+--------+-------------------+ +---------+--------+--+--------+-+---------+  Vertebral PSV cm/s 42 EDV cm/s 9 Antegrade  +---------+--------+--+--------+-+---------+  Summary: Right Carotid: Velocities in the right ICA are consistent with a 1-39% stenosis. Left Carotid: Velocities in the left ICA are  consistent with a 1-39% stenosis. Vertebrals: Bilateral vertebral arteries demonstrate antegrade flow. *See table(s) above for measurements and observations.     Preliminary     Medications:    fludrocortisone  0.1 mg Oral BID   QUEtiapine  12.5 mg Oral q morning - 10a   QUEtiapine  25 mg Oral QHS   rosuvastatin  20 mg Oral Daily   warfarin  2 mg Oral ONCE-1800   Warfarin - Pharmacist Dosing Inpatient   Does not apply q1800   Continuous Infusions:   LOS: 0 days   Geradine Girt  [facility]   How to contact the [facility] Attending or Consulting provider [place] or covering provider during after hours [place], for this patient?  1. Check the care team in [facility] and look for a) attending/consulting TR[facility] provider listed and b) the [facility] team listed 2. Log into [email or web address] and use [place]'s universal password to access. If you do not have the password, please contact the hospital operator. 3. Locate the [facility] provider you are looking for under [facility] and page to a number that you can be directly reached. 4. If you still have difficulty reaching the provider, please page the [facility] (Director on Call) for the Hospitalists listed on amion for assistance.  07/02/2018, 2:07 PM

## 2018-07-02 NOTE — TOC Initial Note (Signed)
Transition of Care [facility]) - Initial/Assessment Note    Patient Details  Name: Katrina Rodriguez MRN: [id number] Date of Birth: 06/09/1928  [facility] CM/SW Contact:    Pollie Friar, RN Phone Number: 07/02/2018, 1:54 PM  Clinical Narrative:                 Pt active with [facility] for therapy. Cory with [facility] aware of admission. She has a friend from church that pays her bills and provides transportation to appointments.   Expected Discharge Plan: [place] Barriers to Discharge: Continued Medical Work up   Patient Goals and CMS Choice   CMS Medicare.gov Compare Post Acute Care list provided to:: Patient(Pt already active with [facility] and wants to continue with them) Choice offered to / list presented to : Patient  Expected Discharge Plan and Services Expected Discharge Plan: [place]   Discharge Planning Services: CM Consult   Living arrangements for the past 2 months: Single Family Home(2 story but has stair lift)                                      Prior Living Arrangements/Services Living arrangements for the past 2 months: Single Family Home(2 story but has stair lift) Lives with:: Other (Comment)(paid aide lives with her) Patient language and need for interpreter reviewed:: Yes(no needs) Do you feel safe going back to the place where you live?: Yes      Need for Family Participation in Patient Care: Yes (Comment) Care giver support system in place?: Yes (comment)(paid aide lives with her) Current home services: DME(rollator/ walker/ 3 in 1/ cane) Criminal Activity/Legal Involvement Pertinent to Current Situation/Hospitalization: No - Comment as needed  Activities of Daily Living Home Assistive Devices/Equipment: Cane (specify quad or straight), Walker (specify type), Shower chair without back ADL Screening (condition at time of admission) Patient's cognitive ability adequate to safely complete daily activities?:  No Is the patient deaf or have difficulty hearing?: Yes Does the patient have difficulty seeing, even when wearing glasses/contacts?: Yes Does the patient have difficulty concentrating, remembering, or making decisions?: No Patient able to express need for assistance with ADLs?: Yes Does the patient have difficulty dressing or bathing?: No Independently performs ADLs?: Yes (appropriate for developmental age) Does the patient have difficulty walking or climbing stairs?: Yes Weakness of Legs: Both Weakness of Arms/Hands: None  Permission Sought/Granted                  Emotional Assessment Appearance:: Appears stated age Attitude/Demeanor/Rapport: Engaged Affect (typically observed): Accepting, Pleasant, Appropriate Orientation: : Oriented to Self, Oriented to Place, Oriented to Situation   Psych Involvement: No (comment)  Admission diagnosis:  Stroke-like symptoms [R29.90] Patient Active Problem List   Diagnosis Date Noted  . TIA (transient ischemic attack) 07/01/2018  . Adjustment disorder with anxious mood 05/26/2018  . Hypothyroidism 06/19/2017  . Left-sided weakness 06/19/2017  . 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 Encounter for therapeutic drug monitoring 06/09/2013  . Complete heart block ([place]) 06/09/2013  . Syncope 04/24/2013  . H/O   . PAROXYSMAL ATRIAL FIBRILLATION 02/02/2010  . 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 DEEP VENOUS THROMBOPHLEBITIS 09/17/2008  . HYPOTENSION, ORTHOSTATIC 09/17/2008  . SYNCOPE 09/17/2008  . CHEST PAIN 09/17/2008  . PPM-[employer]tr[employer] 09/17/2008   PCP:  Leighton Ruff, MD Pharmacy:   [facility] [address] [place] [address] Phone: [phone] Fax: [phone]  [id number] [address] [place] [address] Phone: [phone] Fax: [phone]     Social Determinants of Health (SDOH)  Interventions    Readmission Risk Interventions No flowsheet data found.

## 2018-07-02 NOTE — Progress Notes (Signed)
STROKE TEAM PROGRESS NOTE   INTERVAL HISTORY Patient states that she is doing better but still has persistent left leg weakness.  She has a prior history of TIAs and has been on long-term warfarin for A. fib and INR on admission was optimal at 2.5.  Vitals:   07/02/18 0100 07/02/18 0300 07/02/18 0425 07/02/18 0814  BP: (!) 108/56 (!) 114/48 136/65 (!) 156/83  Pulse: (!) 56 (!) 57 (!) 55 60  Resp: 16 16 16 15   Temp: 98.9 F (37.2 C) 98.5 F (36.9 C) 98.1 F (36.7 C) 97.8 F (36.6 C)  TempSrc: Oral Oral Oral Oral  SpO2: 96% 96% 97% 98%  Weight:      Height:        CBC:  Recent Labs  Lab 07/01/18 1343 07/02/18 0400  WBC 4.6 4.1  NEUTROABS 2.8  --   HGB 13.6 12.0  HCT 42.2 35.9*  MCV 97.5 95.0  PLT 131* 118*    Basic Metabolic Panel:  Recent Labs  Lab 07/01/18 1343  NA 142  K 4.0  CL 104  CO2 29  GLUCOSE 167*  BUN 20  CREATININE 1.33*  CALCIUM 8.7*   Lipid Panel:     Component Value Date/Time   CHOL 137 07/02/2018 0400   CHOL 139 10/31/2017 0819   TRIG 75 07/02/2018 0400   HDL 58 07/02/2018 0400   HDL 60 10/31/2017 0819   CHOLHDL 2.4 07/02/2018 0400   VLDL 15 07/02/2018 0400   LDLCALC 64 07/02/2018 0400   LDLCALC 61 10/31/2017 0819   HgbA1c:  Lab Results  Component Value Date   HGBA1C 5.6 07/02/2018   Urine Drug Screen:     Component Value Date/Time   LABOPIA NONE DETECTED 07/01/2018 1428   COCAINSCRNUR NONE DETECTED 07/01/2018 1428   LABBENZ NONE DETECTED 07/01/2018 1428   AMPHETMU NONE DETECTED 07/01/2018 1428   THCU NONE DETECTED 07/01/2018 1428   LABBARB NONE DETECTED 07/01/2018 1428    Alcohol Level     Component Value Date/Time   ETH <10 07/01/2018 1405    IMAGING Ct Head Wo Contrast  Result Date: 07/02/2018 CLINICAL DATA:  Headache, left-sided weakness. EXAM: CT HEAD WITHOUT CONTRAST TECHNIQUE: Contiguous axial images were obtained from the base of the skull through the vertex without intravenous contrast. COMPARISON:  CT scan of  July 01, 2018. FINDINGS: Brain: Mild chronic ischemic white matter disease is noted. Minimal diffuse cortical atrophy. No mass effect or midline shift is noted. Ventricular size is within normal limits. There is no evidence of mass lesion, hemorrhage or acute infarction. Vascular: No hyperdense vessel or unexpected calcification. Skull: Normal. Negative for fracture or focal lesion. Sinuses/Orbits: No acute finding. Other: None. IMPRESSION: Mild chronic ischemic white matter disease. Minimal diffuse cortical atrophy. No acute intracranial abnormality seen. Electronically Signed   By: Marijo Conception M.D.   On: 07/02/2018 09:06   Ct Head Wo Contrast  Result Date: 07/01/2018 CLINICAL DATA:  Focal neuro deficit.  Left leg weakness. EXAM: CT HEAD WITHOUT CONTRAST TECHNIQUE: Contiguous axial images were obtained from the base of the skull through the vertex without intravenous contrast. COMPARISON:  CT head 06/01/2018 FINDINGS: Brain: Mild atrophy. Negative for acute infarct, hemorrhage, or mass. Mild chronic microvascular ischemic changes in the white matter Vascular: Negative for hyperdense vessel Skull: Negative Sinuses/Orbits: Prior ocular surgery on the left with hyperdense vitreous. No orbital mass. Other: None IMPRESSION: Atrophy and mild chronic microvascular ischemic change. No acute abnormality. Electronically Signed   By:  Franchot Gallo M.D.   On: 07/01/2018 15:12   2D Echocardiogram w/ bubble Normal ejection fraction of greater than 65%.  No cardiac source of embolism.  PHYSICAL EXAM Pleasant elderly Caucasian lady not in distress. . Afebrile. Head is nontraumatic. Neck is supple without bruit.    Cardiac exam no murmur or gallop. Lungs are clear to auscultation. Distal pulses are well felt. Neurological Exam ;  Awake  Alert oriented x 3. Normal speech and language.eye movements full without nystagmus.fundi were not visualized. Vision acuity and fields appear normal. Hearing is normal. Palatal  movements are normal. Face symmetric. Tongue midline. Normal strength, tone, reflexes and coordination except left lower extremity strength is 3/5 proximally as well as distally.  Diminished fine finger movements on the left.  Orbits right over left upper extremity.. Normal sensation. Gait deferred.  ASSESSMENT/PLAN Ms. BERNESE DOFFING is a 85 y.o. female with history of AF, heart block w/ pacer, stroke, pulmonary HTN presenting with L sided weakness and speech changes.    Suspect small right brain subcortical infarct not visualized on CT scan x2 secondary to cardiogenic embolism from atrial fibrillation despite optimal anticoagulation with warfarin.  MRI cannot be obtained due to pacemaker.    CT head No acute stroke. Small vessel disease. Atrophy.   CT head No acute stroke. Small vessel disease. Atrophy.   Carotid Doppler pending  2D Echo w/ bubble normal ejection fraction greater than 65%.  No cardiac source of embolism.  LDL 64  HgbA1c 5.6  Warfarin for VTE prophylaxis  warfarin daily prior to admission, now on warfarin daily. Consider change to eliquis (see note under AF)  Therapy recommendations:  HH OT,    Disposition:  Return home  Atrial Fibrillation  Home anticoagulation:  warfarin daily continued in the hospital  INR 2.2 on admission  INR 2.5 today . Dr. Leonie Man recommends change to Eliquis at discharge. Have contacted CM for pricing prior to change   Orthostatic Hypotension . On flourine  Hyperlipidemia  Home meds:  crestor 10  Crestor increased to 20 in hospital  LDL 64, at goal < 70  Continue statin at discharge  Other Stroke Risk Factors  Advanced age  Hx stroke/TIA  06/2017 L brain TIA d/t AF on warfarin  ? - R PLIC stroke with L HP  Family hx stroke (father)  Hospital day # 0 I have personally obtained history,examined this patient, reviewed notes, independently viewed imaging studies, participated in medical decision making and plan of  care.ROS completed by me personally and pertinent positives fully documented  I have made any additions or clarifications directly to the above note.  She presented with left leg weakness likely due to small right subcortical infarct not visualized on CT scan x2.  She has chronic A. fib and is on warfarin with optimal INR of 2.5.  Recommend consider switching to Eliquis if she can afford her insurance co-pay.  Continue ongoing stroke work-up.  Physical occupational therapy consults.  Discussed with patient and Dr. Eliseo Squires and answered questions.  Greater than 50% time during this 35-minute visit was spent on counseling and coordination of care about her small stroke and atrial fibrillation and answering questions.  Antony Contras, MD Medical Director [facility] Pager: [phone] 07/02/2018 1:59 PM   To contact Stroke Continuity provider, please refer to [email or web address] After hours, contact General Neurology

## 2018-07-02 NOTE — TOC Benefit Eligibility Note (Signed)
Transition of Care [facility]) Benefit Eligibility Note    Patient Details  Name: Katrina Rodriguez MRN: [id number] Date of Birth: 1928/10/09   Medication/Dose: Arne Cleveland  50 MG BID        Prescription Coverage Preferred Pharmacy: YES([facility])  Spoke with Person/Company/Phone Number:: [employer]([facility] # [phone])  Co-Pay: $ 47.00     Deductible: Danley Danker Phone Number: 07/02/2018, 2:40 PM

## 2018-07-02 NOTE — Progress Notes (Signed)
  Echocardiogram 2D Echocardiogram limited with bubbles has been performed.  Darey Hershberger L Androw 07/02/2018, 10:55 AM

## 2018-07-02 NOTE — Evaluation (Signed)
Occupational Therapy Evaluation Patient Details Name: Katrina Rodriguez MRN: [id number] DOB: [birth year] Today's Date: 07/02/2018    History of Present Illness  Katrina Rodriguez is a 85 y.o. female  Afib on coumadin, post ablation,  Complete heart block S/P pacemaker, h/o of R CVA, right eye optic atrophy, retinal detachment, legally blind, hypothyroidism, history of TIA who presents complaining of right facial numbness, weakness, slurred speech and left lower extremity weakness that started the morning of admission.  Patient wake up around 4:30 AM to eat ice cream, when she noticed numbness in the right side of her face and her speech was not right.  She also noticed some left lower extremity weakness.  Patient later during the morning, was working with her physical therapist who also noticed worsening left lower extremity weakness.  Pt presenting to the emergency department today due to increased left-sided leg weakness, transient dysarthria and paresthesia of the left side of the mouth.  Unfortunately patient cannot have an MRI secondary to pacemaker and CT of head did not show any acute infarct.  Exam shows continued 4/5 weakness of the left arm and leg and subjective decreased sensation on the left face along with neglect of the left side of the body with DSS.  Neurology feels patient likely has had another right-sided stroke which increased the leg weakness and gave her her dysarthria along with paresthesias.     Clinical Impression   Pt seen for OT eval - pt very pleasant and very motivated to remain as independent as possible.  Pt presents with decreased balance, decreased functional mobility, L inattention, L hemiplegia (pt states this is now worse than before), decreased functional use of LUE, impaired sensation.  Pt is legally blind. Pt has live in companion who can assist pt - pt will benefit from skilled OT to maximize independence and decrease fall risk. Pt in agreement.      Follow Up  Recommendations  Home health OT;CIR;Supervision/Assistance - 24 hour(see comment above)    Equipment Recommendations  None recommended by OT(pt has all OT equipment)    Recommendations for Other Services Rehab consult(pt has good support at home and wishes either rehab here and then home or [facility])     Precautions / Restrictions Precautions Precautions: Fall Restrictions Weight Bearing Restrictions: No      Mobility Bed Mobility Overal bed mobility: Needs Assistance Bed Mobility: Supine to Sit     Supine to sit: Supervision;HOB elevated     General bed mobility comments: needs extra time. Pt has adjustable bed at home and sits up from [facility] elevated  Transfers Overall transfer level: Needs assistance Equipment used: 1 person hand held assist Transfers: Sit to/from Stand Sit to Stand: Mod assist         General transfer comment: Pt has walker and rollator at home. States she doesn't walk much at home - 'On a good day maybe a 100 feet."      Balance Overall balance assessment: Needs assistance Sitting-balance support: Feet unsupported;No upper extremity supported Sitting balance-Leahy Scale: Fair Sitting balance - Comments: Pt with LOB while attempting to don sock   Standing balance support: Single extremity supported;During functional activity Standing balance-Leahy Scale: Poor Standing balance comment: pt with R posterior bias - unable to maintain midline without mod assist. Pt is aware                           ADL either performed or assessed with  clinical judgement   ADL Overall ADL's : Needs assistance/impaired Eating/Feeding: Set up Eating/Feeding Details (indicate cue type and reason): difficulty opening some containers and cutting meat Grooming: Wash/dry hands;Wash/dry face;Oral care;Modified independent   Upper Body Bathing: Minimal assistance Upper Body Bathing Details (indicate cue type and reason): cue to fully incorporate LUE Lower Body  Bathing: Moderate assistance Lower Body Bathing Details (indicate cue type and reason): assistance for balance and to reach feet and perianal area Upper Body Dressing : Minimal assistance Upper Body Dressing Details (indicate cue type and reason): pt reports difficulty with buttons and sometimes getting button down shirts on     Toilet Transfer: Maximal assistance Toilet Transfer Details (indicate cue type and reason): assist to don over feet as well as for balance and to hike pants, assist with socks and shoes Toileting- Clothing Manipulation and Hygiene: Moderate assistance       Functional mobility during ADLs: Moderate assistance General ADL Comments: Pt with posterior R bias in standing - pt is aware      Vision Baseline Vision/History: Legally blind Patient Visual Report: No change from baseline Vision Assessment?: Vision impaired- to be further tested in functional context     Perception     Praxis      Pertinent Vitals/Pain Pain Assessment: No/denies pain     Hand Dominance Right   Extremity/Trunk Assessment Upper Extremity Assessment Upper Extremity Assessment: LUE deficits/detail LUE Deficits / Details: Pt L inattention to LUE and LLE, with cues has ROM WFL's for basic self care  (limited in overhead reach).  Pt with decreased strength LUE of 3+/5  was working with HHOT PTA due to residual L sided weakness from prior stroke and falls. Pt states she feels her LUE and LE's are weaker than they were before.  LUE Sensation: decreased light touch LUE Coordination: decreased fine motor           Communication     Cognition Arousal/Alertness: Awake/alert Behavior During Therapy: WFL for tasks assessed/performed Overall Cognitive Status: Within Functional Limits for tasks assessed                                 General Comments: Pt very independent however is aware of risks.  Pt has allowed dtr to hire live in companion who can assist as needed    General Comments       Exercises     Shoulder Instructions      Home Living Family/patient expects to be discharged to:: Private residence Living Arrangements: Other (Comment)(pt reports one week before this happened her dtr hired live in young woman to assist) Available Help at Discharge: Other (Comment)(young woman just moved in PTA to help "when I need it") Type of Home: House             Bathroom Shower/Tub: Teacher, early years/pre: Handicapped height     Home Equipment: Environmental consultant - 2 wheels;Bedside commode;Tub bench;Other (comment);Grab bars - tub/shower(Also has rollator)   Additional Comments: PTA pt was working with [place] [facility] [place] had recommended additonal grab bars in bathroom.  Pt currently not using BSC but states she can "if I need to"  Lives With: Other (Comment)(caregiver see above)    Prior Functioning/Environment Level of Independence: Independent with assistive device(s)                 OT Problem List: Decreased strength;Decreased range of motion;Decreased activity  tolerance;Impaired balance (sitting and/or standing);Impaired sensation;Impaired UE functional use(pt is legally blind)      OT Treatment/Interventions: Self-care/ADL training;Therapeutic exercise;Neuromuscular education;DME and/or AE instruction;Therapeutic activities;Balance training;Patient/family education    OT Goals(Current goals can be found in the care plan section) Acute Rehab OT Goals Patient Stated Goal: I want to get stronger OT Goal Formulation: With patient Time For Goal Achievement: 07/09/18 Potential to Achieve Goals: Good  OT Frequency: Min 3X/week   Barriers to D/C: Other (comment)  feel pt would benefit from either CIR or SNF level rehab - pt has refused - "I only want therapy while I am here and at home"       Co-evaluation              AM-PAC OT "6 Clicks" Daily Activity     Outcome Measure Help from another person eating meals?: A  Little Help from another person taking care of personal grooming?: None Help from another person toileting, which includes using toliet, bedpan, or urinal?: A Lot Help from another person bathing (including washing, rinsing, drying)?: A Lot Help from another person to put on and taking off regular upper body clothing?: A Lot Help from another person to put on and taking off regular lower body clothing?: A Lot 6 Click Score: 15   End of Session Equipment Utilized During Treatment: Gait belt  Activity Tolerance: Patient tolerated treatment well Patient left: in bed;with call bell/phone within reach;with bed alarm set  OT Visit Diagnosis: Unsteadiness on feet (R26.81);Muscle weakness (generalized) (M62.81);Other symptoms and signs involving the nervous system (R29.898);Hemiplegia and hemiparesis Hemiplegia - Right/Left: Left Hemiplegia - dominant/non-dominant: Non-Dominant Hemiplegia - caused by: Cerebral infarction                Time: 9678-9381 OT Time Calculation (min): 27 min Charges:  OT General Charges $OT Visit: 1 Visit OT Evaluation $OT Eval Moderate Complexity: 1 Mod OT Treatments $Self Care/Home Management : 8-22 mins    Quay Burow, OTR/L 07/02/2018, 11:32 AM

## 2018-07-02 NOTE — Progress Notes (Signed)
Carotid artery duplex has been completed. Preliminary results can be found in CV Proc through chart review.   07/02/18 12:47 PM Katrina Rodriguez RVT

## 2018-07-02 NOTE — Progress Notes (Signed)
PT Cancellation Note  Patient Details Name: Katrina Rodriguez MRN: [id number] DOB: 03-Dec-1928   Cancelled Treatment:    Reason Eval/Treat Not Completed: Patient at procedure or test/unavailable. Attempted x2 this morning. Other staff present in room each attempt. Will continue to follow and initiate PT evaluation when pt is available and agreeable to participate.    Thelma Comp 07/02/2018, 10:59 AM   Rolinda Roan, PT, DPT Acute Rehabilitation Services Pager: [phone] Office: [phone]

## 2018-07-03 DIAGNOSIS — G459 Transient cerebral ischemic attack, unspecified: Secondary | ICD-10-CM

## 2018-07-03 DIAGNOSIS — I480 Paroxysmal atrial fibrillation: Secondary | ICD-10-CM | POA: Diagnosis not present

## 2018-07-03 DIAGNOSIS — R2990 Unspecified symptoms and signs involving the nervous system: Secondary | ICD-10-CM | POA: Diagnosis not present

## 2018-07-03 LAB — CBC
HCT: 35.9 % — ABNORMAL LOW (ref 36.0–46.0)
Hemoglobin: 11.9 g/dL — ABNORMAL LOW (ref 12.0–15.0)
MCH: 31.7 pg (ref 26.0–34.0)
MCHC: 33.1 g/dL (ref 30.0–36.0)
MCV: 95.7 fL (ref 80.0–100.0)
Platelets: 120 10*3/uL — ABNORMAL LOW (ref 150–400)
RBC: 3.75 MIL/uL — ABNORMAL LOW (ref 3.87–5.11)
RDW: 13.4 % (ref 11.5–15.5)
WBC: 3.9 10*3/uL — ABNORMAL LOW (ref 4.0–10.5)
nRBC: 0 % (ref 0.0–0.2)

## 2018-07-03 LAB — PROTIME-INR
INR: 2 — ABNORMAL HIGH (ref 0.8–1.2)
Prothrombin Time: 22.1 seconds — ABNORMAL HIGH (ref 11.4–15.2)

## 2018-07-03 MED ORDER — APIXABAN 2.5 MG PO TABS: 2.5000 mg | ORAL_TABLET | Freq: Two times a day (BID) | ORAL | Status: DC

## 2018-07-03 MED ORDER — APIXABAN 2.5 MG PO TABS: 2.5000 mg | ORAL_TABLET | Freq: Two times a day (BID) | ORAL | 3 refills | Status: DC

## 2018-07-03 MED FILL — ELIQUIS 2.5 MG TABLET: 2.5 | 30 days supply | Qty: 60 | Fill #0

## 2018-07-03 NOTE — TOC Transition Note (Signed)
Transition of Care [facility]) - CM/SW Discharge Note   Patient Details  Name: Katrina Rodriguez MRN: [id number] Date of Birth: 12-04-1928  Transition of Care [facility]) CM/SW Contact:  Pollie Friar, RN Phone Number: 07/03/2018, 12:06 PM   Clinical Narrative:    Pt discharging home with resumption of [place] services through [facility]. Cory with [facility] aware of d/c home today.  Pt asked that CM called [facility] for transportation home. CM spoke to [place] and she will pick the patient up at 1 pm. Bedside RN updated.  Med to be delivered to the room by [facility] pharmacy.    Final next level of care: [place] Barriers to Discharge: Barriers Resolved   Patient Goals and CMS Choice Patient states their goals for this hospitalization and ongoing recovery are:: to get back home  CMS Medicare.gov Compare Post Acute Care list provided to:: Patient(Pt already active with [facility] and wants to continue with them) Choice offered to / list presented to : Patient  Discharge Placement                       Discharge Plan and Services   Discharge Planning Services: CM Consult Post Acute Care Choice: Home Health, Resumption of Svcs/PTA Provider                    HH Arranged: PT, OT HH Agency: [facility] Date [place]: 07/02/18 Time [place]: 1504 Representative spoke with at [facility]: [place] ([place]) Interventions     Readmission Risk Interventions No flowsheet data found.

## 2018-07-03 NOTE — Progress Notes (Signed)
ANTICOAGULATION CONSULT NOTE - Initial Consult  Pharmacy Consult for Eliquis Indication: atrial fibrillation  Allergies  Allergen Reactions  . Amiodarone Other (See Comments)    Caused thyroid problems  . Amoxicillin Hives  . Aspirin Other (See Comments)    On tikosyn and warfarin  . Codeine Other (See Comments)    Causes BP to drop  . Darvocet [Propoxyphene N-Acetaminophen] Hives  . Demerol Itching  . Eliquis [Apixaban] Nausea Only  . Penicillins Hives    Did it involve swelling of the face/tongue/throat, SOB, or low BP? Unknown Did it involve sudden or severe rash/hives, skin peeling, or any reaction on the inside of your mouth or nose? Unknown Did you need to seek medical attention at a hospital or doctor's office? Unknown When did it last happen?young adult or child?? If all above answers are "NO", may proceed with cephalosporin use.  Alveda Reasons [Rivaroxaban] Rash    Patient Measurements: Height: 5\' 4"  (162.6 cm) Weight: 111 lb (50.3 kg) IBW/kg (Calculated) : 54.7  Vital Signs: Temp: 97.4 F (36.3 C) (04/29 0747) Temp Source: Oral (04/29 0747) BP: 147/67 (04/29 0747) Pulse Rate: 60 (04/29 0747)  Labs: Recent Labs    07/01/18 1343 07/02/18 0400 07/03/18 0428  HGB 13.6 12.0 11.9*  HCT 42.2 35.9* 35.9*  PLT 131* 118* 120*  APTT 37*  --   --   LABPROT 23.7* 26.4* 22.1*  INR 2.2* 2.5* 2.0*  CREATININE 1.33*  --   --     Estimated Creatinine Clearance: 23.2 mL/min (A) (by C-G formula based on SCr of 1.33 mg/dL (H)).   Medical History: Past Medical History:  Diagnosis Date  . Arthritis   . Atrial fibrillation (HCC)    History of ablation - Dr. Rayann Heman  . Chest pain    Has normal coronaries per cath in 2005; negative nuclear in 2010  . Chronic anemia   . Complete heart block ([place]) 06/09/2013  . H/O: CVA (cardiovascular accident)    Right internal capsule  . Hypothyroidism 06/19/2017  . Left-sided weakness 06/19/2017  . Mild pulmonary hypertension  ([place])   . 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 Orthostasis   . Retinal detachment   . Thrombocytopenia ([place])   . TIA (transient ischemic attack)     Assessment: Anticoag: warfarin for afib. Change to apixaban per Neuro. INR 2.5>2. this AM. Hgb 11.9 this AM. Plts 120 low but stable.  Goal of Therapy:  Therapeutic oral anticoagulation   Plan:  Apixaban 2.5mg  BID once INR <2 (start 4/29 PM) Note: INR will rising again when starts Eliquis due to drug/lab interaction. Note: Apixaban allergy with nausea?  Moreen Piggott S. Alford Highland, PharmD, BCPS Clinical Staff Pharmacist Eilene Ghazi [place] 07/03/2018,10:11 AM

## 2018-07-03 NOTE — Plan of Care (Signed)
Adequate for release

## 2018-07-03 NOTE — Discharge Instructions (Signed)

## 2018-07-03 NOTE — Progress Notes (Signed)
STROKE TEAM PROGRESS NOTE   INTERVAL HISTORY Patients neurological condition is stable. No changes  Vitals:   07/02/18 2336 07/03/18 0310 07/03/18 0747 07/03/18 1203  BP: (!) 110/57 116/61 (!) 147/67 (!) 142/66  Pulse: 61 71 60 60  Resp: 18 20 18 18   Temp: 98 F (36.7 C) 98.3 F (36.8 C) (!) 97.4 F (36.3 C) 98.2 F (36.8 C)  TempSrc: Oral Oral Oral Oral  SpO2: 99% 100% 95% 97%  Weight:      Height:        CBC:  Recent Labs  Lab 07/01/18 1343 07/02/18 0400 07/03/18 0428  WBC 4.6 4.1 3.9*  NEUTROABS 2.8  --   --   HGB 13.6 12.0 11.9*  HCT 42.2 35.9* 35.9*  MCV 97.5 95.0 95.7  PLT 131* 118* 120*    Basic Metabolic Panel:  Recent Labs  Lab 07/01/18 1343  NA 142  K 4.0  CL 104  CO2 29  GLUCOSE 167*  BUN 20  CREATININE 1.33*  CALCIUM 8.7*   Lipid Panel:     Component Value Date/Time   CHOL 137 07/02/2018 0400   CHOL 139 10/31/2017 0819   TRIG 75 07/02/2018 0400   HDL 58 07/02/2018 0400   HDL 60 10/31/2017 0819   CHOLHDL 2.4 07/02/2018 0400   VLDL 15 07/02/2018 0400   LDLCALC 64 07/02/2018 0400   LDLCALC 61 10/31/2017 0819   HgbA1c:  Lab Results  Component Value Date   HGBA1C 5.6 07/02/2018   Urine Drug Screen:     Component Value Date/Time   LABOPIA NONE DETECTED 07/01/2018 1428   COCAINSCRNUR NONE DETECTED 07/01/2018 1428   LABBENZ NONE DETECTED 07/01/2018 1428   AMPHETMU NONE DETECTED 07/01/2018 1428   THCU NONE DETECTED 07/01/2018 1428   LABBARB NONE DETECTED 07/01/2018 1428    Alcohol Level     Component Value Date/Time   ETH <10 07/01/2018 1405    IMAGING Ct Head Wo Contrast  Result Date: 07/02/2018 CLINICAL DATA:  Headache, left-sided weakness. EXAM: CT HEAD WITHOUT CONTRAST TECHNIQUE: Contiguous axial images were obtained from the base of the skull through the vertex without intravenous contrast. COMPARISON:  CT scan of July 01, 2018. FINDINGS: Brain: Mild chronic ischemic white matter disease is noted. Minimal diffuse cortical  atrophy. No mass effect or midline shift is noted. Ventricular size is within normal limits. There is no evidence of mass lesion, hemorrhage or acute infarction. Vascular: No hyperdense vessel or unexpected calcification. Skull: Normal. Negative for fracture or focal lesion. Sinuses/Orbits: No acute finding. Other: None. IMPRESSION: Mild chronic ischemic white matter disease. Minimal diffuse cortical atrophy. No acute intracranial abnormality seen. Electronically Signed   By: Marijo Conception M.D.   On: 07/02/2018 09:06   Ct Head Wo Contrast  Result Date: 07/01/2018 CLINICAL DATA:  Focal neuro deficit.  Left leg weakness. EXAM: CT HEAD WITHOUT CONTRAST TECHNIQUE: Contiguous axial images were obtained from the base of the skull through the vertex without intravenous contrast. COMPARISON:  CT head 06/01/2018 FINDINGS: Brain: Mild atrophy. Negative for acute infarct, hemorrhage, or mass. Mild chronic microvascular ischemic changes in the white matter Vascular: Negative for hyperdense vessel Skull: Negative Sinuses/Orbits: Prior ocular surgery on the left with hyperdense vitreous. No orbital mass. Other: None IMPRESSION: Atrophy and mild chronic microvascular ischemic change. No acute abnormality. Electronically Signed   By: Franchot Gallo M.D.   On: 07/01/2018 15:12   Vas US Carotid  Result Date: 07/02/2018 Carotid Arterial Duplex Study Indications:  CVA. Comparison Study:  06/20/17 - 1-39% ICA stenosis bilaterally Performing Technologist: Oliver Hum RVT  Examination Guidelines: A complete evaluation includes B-mode imaging, spectral Doppler, color Doppler, and power Doppler as needed of all accessible portions of each vessel. Bilateral testing is considered an integral part of a complete examination. Limited examinations for reoccurring indications may be performed as noted.  Right Carotid Findings: +----------+--------+--------+--------+-----------------------+--------+           PSV cm/sEDV  cm/sStenosisDescribe               Comments +----------+--------+--------+--------+-----------------------+--------+ CCA Prox  120     8               smooth and heterogenoustortuous +----------+--------+--------+--------+-----------------------+--------+ CCA Distal57      11              smooth and heterogenous         +----------+--------+--------+--------+-----------------------+--------+ ICA Prox  39      8               smooth and heterogenous         +----------+--------+--------+--------+-----------------------+--------+ ICA Distal72      10                                     tortuous +----------+--------+--------+--------+-----------------------+--------+ ECA       86      5                                               +----------+--------+--------+--------+-----------------------+--------+ +----------+--------+-------+--------+-------------------+           PSV cm/sEDV cmsDescribeArm Pressure (mmHG) +----------+--------+-------+--------+-------------------+ Subclavian109                                        +----------+--------+-------+--------+-------------------+ +---------+--------+--+--------+-+---------+ VertebralPSV cm/s46EDV cm/s4Antegrade +---------+--------+--+--------+-+---------+  Left Carotid Findings: +----------+--------+-------+--------+--------------------------------+--------+           PSV cm/sEDV    StenosisDescribe                        Comments                   cm/s                                                    +----------+--------+-------+--------+--------------------------------+--------+ CCA Prox  73      8              smooth and heterogenous                  +----------+--------+-------+--------+--------------------------------+--------+ CCA Distal74      12             smooth and heterogenous                   +----------+--------+-------+--------+--------------------------------+--------+ ICA Prox  75      13             smooth, heterogenous and  calcific                                 +----------+--------+-------+--------+--------------------------------+--------+ ICA Distal65      18                                             tortuous +----------+--------+-------+--------+--------------------------------+--------+ ECA       111     0                                                       +----------+--------+-------+--------+--------------------------------+--------+ +----------+--------+--------+--------+-------------------+ SubclavianPSV cm/sEDV cm/sDescribeArm Pressure (mmHG) +----------+--------+--------+--------+-------------------+           91                                          +----------+--------+--------+--------+-------------------+ +---------+--------+--+--------+-+---------+ VertebralPSV cm/s42EDV cm/s9Antegrade +---------+--------+--+--------+-+---------+  Summary: Right Carotid: Velocities in the right ICA are consistent with a 1-39% stenosis. Left Carotid: Velocities in the left ICA are consistent with a 1-39% stenosis. Vertebrals: Bilateral vertebral arteries demonstrate antegrade flow. *See table(s) above for measurements and observations.  Electronically signed by Harold Barban MD on 07/02/2018 at 5:12:08 PM.    Final    2D Echocardiogram w/ bubble Normal ejection fraction of greater than 65%.  No cardiac source of embolism.  PHYSICAL EXAM: Pleasant elderly Caucasian lady not in distress. . Afebrile. Head is nontraumatic. Neck is supple without bruit.    Cardiac exam no murmur or gallop. Lungs are clear to auscultation. Distal pulses are well felt. Neurological Exam ;  Awake  Alert oriented x 3. Normal speech and language.eye movements full without nystagmus.fundi were not visualized. Vision  acuity and fields appear normal. Hearing is normal. Palatal movements are normal. Face symmetric. Tongue midline. Normal strength, tone, reflexes and coordination except left lower extremity strength is 3/5 proximally as well as distally.  Diminished fine finger movements on the left.  Orbits right over left upper extremity.. Normal sensation. Gait deferred.   ASSESSMENT/PLAN Katrina Rodriguez is a 85 y.o. female with history of AF, heart block w/ pacer, stroke, pulmonary HTN presenting with L sided weakness and speech changes.    Suspect small right brain subcortical infarct not visualized on CT scan x2 secondary to cardiogenic embolism from atrial fibrillation despite optimal anticoagulation with warfarin.  MRI cannot be obtained due to pacemaker.    CT head No acute stroke. Small vessel disease. Atrophy.   CT head No acute stroke. Small vessel disease. Atrophy.   Carotid Doppler B ICA 1-39% stenosis, VAs antegrade   2D Echo w/ bubble normal ejection fraction greater than 65%.  No cardiac source of embolism.  LDL 64  HgbA1c 5.6  Warfarin for VTE prophylaxis  warfarin daily prior to admission, changed to eliquis at d/c   Therapy recommendations:  [facility] OT, PT - resume previously active HH    Disposition:  Return home  Atrial Fibrillation  Home anticoagulation:  warfarin daily continued in the hospital  INR 2.2 on admission . Dr. Leonie Man recommended change to Eliquis at discharge.  She is agreeable.  . To start Eliquis at d/c   Orthostatic Hypotension . On flourine  Hyperlipidemia  Home meds:  crestor 10  Crestor increased to 20 in hospital  LDL 64, at goal < 70  Continue statin at discharge  Other Stroke Risk Factors  Advanced age  Hx stroke/TIA  06/2017 L brain TIA d/t AF on warfarin  ? - R [facility] stroke with L HP  Family hx stroke (father)  Hospital day # 0      She presented with left leg weakness likely due to small right subcortical infarct not visualized  on CT scan x2.  She has chronic A. fib and is on warfarin with optimal INR of 2.5.  Recommend consider switching to Eliquis if she can afford her insurance co-pay.    Stroke team will sign off. Call for questions. Antony Contras, MD Medical Director [facility] Pager: [phone] 07/03/2018 2:01 PM   To contact Stroke Continuity provider, please refer to [email or web address] After hours, contact General Neurology

## 2018-07-03 NOTE — Discharge Summary (Signed)
Physician Discharge Summary  Katrina Rodriguez VOH:[id number] DOB: [birth year] DOA: 07/01/2018  PCP: Leighton Ruff, MD  Admit date: 07/01/2018 Discharge date: 07/03/2018  Admitted From: Home. Disposition: Home with home health and outpatient PT  Recommendations for Outpatient Follow-up:  1. Follow up with PCP in 1-2 weeks 2. Please obtain BMP/CBC in one week 3. Please follow up on the following pending results:  Home Health: PT OT Equipment/Devices: None  Discharge Condition: Stable CODE STATUS: Full code Diet recommendation: Regular diet  Brief/Interim Summary: 85 year old female with history of A. 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  On arrival to the emergency room she had minimal numbness and tingling on the right face and some weakness on her left ankle otherwise no other gross neurological deficit.  Discharge Diagnoses:  Active Problems:   PAROXYSMAL ATRIAL FIBRILLATION   BRADYCARDIA-TACHYCARDIA SYNDROME   HYPOTENSION, ORTHOSTATIC   TIA (transient ischemic attack)  Strokelike symptoms and suspected stroke: Patient unable to have MRI because she has a pacemaker.  Patient remained largely clinically stable.  Seen and followed by neurology.  She was suggested to discontinue Coumadin and start on Eliquis.  Previous history of nausea with Eliquis, however she wants to try.  She will start Eliquis tonight as her INR is 2 today.  Patient is on sinus rhythm, she is on Tikosyn.   Discharge Instructions  Discharge Instructions    Call MD for:  persistant dizziness or light-headedness   Complete by:  As directed    Diet - low sodium heart healthy   Complete by:  As directed    Increase activity slowly   Complete by:  As directed      Allergies as of 07/03/2018      Reactions    Amiodarone Other (See Comments)   Caused thyroid problems   Amoxicillin Hives   Aspirin Other (See Comments)   On tikosyn and warfarin   Codeine Other (See Comments)   Causes BP to drop   Darvocet [propoxyphene N-acetaminophen] Hives   Demerol Itching   Eliquis [apixaban] Nausea Only   Penicillins Hives   Did it involve swelling of the face/tongue/throat, SOB, or low BP? Unknown Did it involve sudden or severe rash/hives, skin peeling, or any reaction on the inside of your mouth or nose? Unknown Did you need to seek medical attention at a hospital or doctor's office? Unknown When did it last happen?young adult or child?? If all above answers are "NO", may proceed with cephalosporin use.   Xarelto [rivaroxaban] Rash      Medication List    STOP taking these medications   warfarin 4 MG tablet Commonly known as:  COUMADIN     TAKE these medications   acetaminophen 500 MG tablet Commonly known as:  TYLENOL Take 500 mg by mouth every 6 (six) hours as needed for moderate pain.   apixaban 2.5 MG Tabs tablet Commonly known as:  ELIQUIS Take 1 tablet (2.5 mg total) by mouth 2 (two) times daily.   dofetilide 250 MCG capsule Commonly known as:  TIKOSYN Take 250 mcg by mouth 2 (two) times daily.   fludrocortisone 0.1 MG tablet Commonly known as:  FLORINEF Take 1 tablet (0.1 mg total) by mouth 2 (two) times daily.   potassium chloride SA 20 MEQ tablet Commonly known as:  K-DUR  Take 2 tablets (40 mEq total) by mouth daily. What changed:  how much to take   QUEtiapine 25 MG tablet Commonly known as:  SEROquel 1/2 tablet in the morning, 1 tablet at night What changed:    how much to take  how to take this  when to take this  additional instructions   rosuvastatin 10 MG tablet Commonly known as:  CRESTOR Take 1 tablet (10 mg total) by mouth daily.       Allergies  Allergen Reactions  . Amiodarone Other (See Comments)    Caused thyroid problems  .  Amoxicillin Hives  . Aspirin Other (See Comments)    On tikosyn and warfarin  . Codeine Other (See Comments)    Causes BP to drop  . Darvocet [Propoxyphene N-Acetaminophen] Hives  . Demerol Itching  . Eliquis [Apixaban] Nausea Only  . Penicillins Hives    Did it involve swelling of the face/tongue/throat, SOB, or low BP? Unknown Did it involve sudden or severe rash/hives, skin peeling, or any reaction on the inside of your mouth or nose? Unknown Did you need to seek medical attention at a hospital or doctor's office? Unknown When did it last happen?young adult or child?? If all above answers are "NO", may proceed with cephalosporin use.  Alveda Reasons [Rivaroxaban] Rash    Consultations:  Neurology    Procedures/Studies: Ct Head Wo Contrast  Result Date: 07/02/2018 CLINICAL DATA:  Headache, left-sided weakness. EXAM: CT HEAD WITHOUT CONTRAST TECHNIQUE: Contiguous axial images were obtained from the base of the skull through the vertex without intravenous contrast. COMPARISON:  CT scan of July 01, 2018. FINDINGS: Brain: Mild chronic ischemic white matter disease is noted. Minimal diffuse cortical atrophy. No mass effect or midline shift is noted. Ventricular size is within normal limits. There is no evidence of mass lesion, hemorrhage or acute infarction. Vascular: No hyperdense vessel or unexpected calcification. Skull: Normal. Negative for fracture or focal lesion. Sinuses/Orbits: No acute finding. Other: None. IMPRESSION: Mild chronic ischemic white matter disease. Minimal diffuse cortical atrophy. No acute intracranial abnormality seen. Electronically Signed   By: Marijo Conception M.D.   On: 07/02/2018 09:06   Ct Head Wo Contrast  Result Date: 07/01/2018 CLINICAL DATA:  Focal neuro deficit.  Left leg weakness. EXAM: CT HEAD WITHOUT CONTRAST TECHNIQUE: Contiguous axial images were obtained from the base of the skull through the vertex without intravenous contrast. COMPARISON:  CT  head 06/01/2018 FINDINGS: Brain: Mild atrophy. Negative for acute infarct, hemorrhage, or mass. Mild chronic microvascular ischemic changes in the white matter Vascular: Negative for hyperdense vessel Skull: Negative Sinuses/Orbits: Prior ocular surgery on the left with hyperdense vitreous. No orbital mass. Other: None IMPRESSION: Atrophy and mild chronic microvascular ischemic change. No acute abnormality. Electronically Signed   By: Franchot Gallo M.D.   On: 07/01/2018 15:12   Vas US Carotid  Result Date: 07/02/2018 Carotid Arterial Duplex Study Indications:       CVA. Comparison Study:  06/20/17 - 1-39% ICA stenosis bilaterally Performing Technologist: Oliver Hum RVT  Examination Guidelines: A complete evaluation includes B-mode imaging, spectral Doppler, color Doppler, and power Doppler as needed of all accessible portions of each vessel. Bilateral testing is considered an integral part of a complete examination. Limited examinations for reoccurring indications may be performed as noted.  Right Carotid Findings: +----------+--------+--------+--------+-----------------------+--------+           PSV cm/sEDV cm/sStenosisDescribe  Comments +----------+--------+--------+--------+-----------------------+--------+ CCA Prox  120     8               smooth and heterogenoustortuous +----------+--------+--------+--------+-----------------------+--------+ CCA Distal57      11              smooth and heterogenous         +----------+--------+--------+--------+-----------------------+--------+ ICA Prox  39      8               smooth and heterogenous         +----------+--------+--------+--------+-----------------------+--------+ ICA Distal72      10                                     tortuous +----------+--------+--------+--------+-----------------------+--------+ ECA       86      5                                                +----------+--------+--------+--------+-----------------------+--------+ +----------+--------+-------+--------+-------------------+           PSV cm/sEDV cmsDescribeArm Pressure (mmHG) +----------+--------+-------+--------+-------------------+ Subclavian109                                        +----------+--------+-------+--------+-------------------+ +---------+--------+--+--------+-+---------+ VertebralPSV cm/s46EDV cm/s4Antegrade +---------+--------+--+--------+-+---------+  Left Carotid Findings: +----------+--------+-------+--------+--------------------------------+--------+           PSV cm/sEDV    StenosisDescribe                        Comments                   cm/s                                                    +----------+--------+-------+--------+--------------------------------+--------+ CCA Prox  73      8              smooth and heterogenous                  +----------+--------+-------+--------+--------------------------------+--------+ CCA Distal74      12             smooth and heterogenous                  +----------+--------+-------+--------+--------------------------------+--------+ ICA Prox  75      13             smooth, heterogenous and                                                  calcific                                 +----------+--------+-------+--------+--------------------------------+--------+ ICA Distal65      18  tortuous +----------+--------+-------+--------+--------------------------------+--------+ ECA       111     0                                                       +----------+--------+-------+--------+--------------------------------+--------+ +----------+--------+--------+--------+-------------------+ SubclavianPSV cm/sEDV cm/sDescribeArm Pressure (mmHG) +----------+--------+--------+--------+-------------------+           91                                           +----------+--------+--------+--------+-------------------+ +---------+--------+--+--------+-+---------+ VertebralPSV cm/s42EDV cm/s9Antegrade +---------+--------+--+--------+-+---------+  Summary: Right Carotid: Velocities in the right ICA are consistent with a 1-39% stenosis. Left Carotid: Velocities in the left ICA are consistent with a 1-39% stenosis. Vertebrals: Bilateral vertebral arteries demonstrate antegrade flow. *See table(s) above for measurements and observations.  Electronically signed by Harold Barban MD on 07/02/2018 at 5:12:08 PM.    Final        Subjective: Patient was seen and examined on the day of discharge.  She still has some numbness on her right side of the mouth and cannot of the lips otherwise denies any complaints.  She was eager to go home.   Discharge Exam: Vitals:   07/03/18 0747 07/03/18 1203  BP: (!) 147/67 (!) 142/66  Pulse: 60 60  Resp: 18 18  Temp: (!) 97.4 F (36.3 C) 98.2 F (36.8 C)  SpO2: 95% 97%   Vitals:   07/02/18 2336 07/03/18 0310 07/03/18 0747 07/03/18 1203  BP: (!) 110/57 116/61 (!) 147/67 (!) 142/66  Pulse: 61 71 60 60  Resp: 18 20 18 18   Temp: 98 F (36.7 C) 98.3 F (36.8 C) (!) 97.4 F (36.3 C) 98.2 F (36.8 C)  TempSrc: Oral Oral Oral Oral  SpO2: 99% 100% 95% 97%  Weight:      Height:        General: Pt is alert, awake, not in acute distress Cardiovascular: RRR, S1/S2 +, no rubs, no gallops Respiratory: CTA bilaterally, no wheezing, no rhonchi Abdominal: Soft, NT, ND, bowel sounds + Extremities: no edema, no cyanosis No neurological deficits.   The results of significant diagnostics from this hospitalization (including imaging, microbiology, ancillary and laboratory) are listed below for reference.     Microbiology: No results found for this or any previous visit (from the past 240 hour(s)).   Labs: BNP (last 3 results) No results for input(s): BNP in the last 8760  hours. Basic Metabolic Panel: Recent Labs  Lab 07/01/18 1343  NA 142  K 4.0  CL 104  CO2 29  GLUCOSE 167*  BUN 20  CREATININE 1.33*  CALCIUM 8.7*   Liver Function Tests: Recent Labs  Lab 07/01/18 1343  AST 22  ALT 13  ALKPHOS 83  BILITOT 0.5  PROT 6.4*  ALBUMIN 3.8   No results for input(s): LIPASE, AMYLASE in the last 168 hours. No results for input(s): AMMONIA in the last 168 hours. CBC: Recent Labs  Lab 07/01/18 1343 07/02/18 0400 07/03/18 0428  WBC 4.6 4.1 3.9*  NEUTROABS 2.8  --   --   HGB 13.6 12.0 11.9*  HCT 42.2 35.9* 35.9*  MCV 97.5 95.0 95.7  PLT 131* 118* 120*   Cardiac Enzymes: No results for input(s): CKTOTAL, CKMB, CKMBINDEX, TROPONINI  in the last 168 hours. BNP: Invalid input(s): POCBNP CBG: Recent Labs  Lab 07/01/18 1334  GLUCAP 159*   D-Dimer No results for input(s): DDIMER in the last 72 hours. Hgb A1c Recent Labs    07/02/18 0400  HGBA1C 5.6   Lipid Profile Recent Labs    07/02/18 0400  CHOL 137  HDL 58  LDLCALC 64  TRIG 75  CHOLHDL 2.4   Thyroid function studies No results for input(s): TSH, T4TOTAL, T3FREE, THYROIDAB in the last 72 hours.  Invalid input(s): FREET3 Anemia work up No results for input(s): VITAMINB12, FOLATE, FERRITIN, TIBC, IRON, RETICCTPCT in the last 72 hours. Urinalysis    Component Value Date/Time   COLORURINE YELLOW 07/01/2018 1428   APPEARANCEUR CLEAR 07/01/2018 1428   LABSPEC 1.015 07/01/2018 1428   PHURINE 6.0 07/01/2018 1428   GLUCOSEU NEGATIVE 07/01/2018 1428   HGBUR MODERATE (A) 07/01/2018 1428   BILIRUBINUR NEGATIVE 07/01/2018 1428   KETONESUR NEGATIVE 07/01/2018 1428   PROTEINUR NEGATIVE 07/01/2018 1428   NITRITE NEGATIVE 07/01/2018 1428   LEUKOCYTESUR SMALL (A) 07/01/2018 1428   Sepsis Labs Invalid input(s): PROCALCITONIN,  WBC,  LACTICIDVEN Microbiology No results found for this or any previous visit (from the past 240 hour(s)).   Time coordinating discharge:  25  minutes  SIGNED:   Barb Merino, MD  [facility] 07/03/2018, 1:59 PM Pager   If 7PM-7AM, please contact night-coverage [email or web address] Password [email or web address]

## 2018-07-03 NOTE — Progress Notes (Signed)
Physical Therapy Treatment Patient Details Name: Katrina Rodriguez MRN: [id number] DOB: Dec 13, 1928 Today's Date: 07/03/2018    History of Present Illness Pt is an 85 y.o. female with a PMH significant for Afib on coumadin s/p ablation, complete heart block S/P pacemaker, R CVA, right eye optic atrophy, retinal detachment, legally blind, hypothyroidism, who presents complaining of right facial numbness, weakness, slurred speech and left LE weakness that started the morning of admission.  Pt presenting to the emergency department today due to increased left-sided LE weakness, transient dysarthria and paresthesia of the left side of the mouth.  Unfortunately patient cannot have an MRI secondary to pacemaker and CT of head did not show any acute infarct.    PT Comments    Pt progressing towards physical therapy goals. Was able to perform transfers and ambulation with gross min guard assist progressing to supervision for safety with RW. Pt with improved tolerance for functional activity today and only took 1 standing rest break at nurses' station prior to returning to room. Will continue to follow and progress as able per POC.    Follow Up Recommendations  Home health PT     Equipment Recommendations  None recommended by PT    Recommendations for Other Services       Precautions / Restrictions Precautions Precautions: Fall Restrictions Weight Bearing Restrictions: No    Mobility  Bed Mobility Overal bed mobility: Needs Assistance Bed Mobility: Supine to Sit;Sit to Supine     Supine to sit: Supervision;HOB elevated Sit to supine: Supervision   General bed mobility comments: Increased time required. Pt was able to get LE's back up into bed this session without assistance.   Transfers Overall transfer level: Needs assistance Equipment used: Rolling walker (2 wheeled) Transfers: Sit to/from Stand Sit to Stand: Min guard;Supervision         General transfer comment: Light guard to  supervision with RW for support. Pt required cues for hand placement on seated surfce for safety prior to initiating stand>sit.   Ambulation/Gait Ambulation/Gait assistance: Min guard;Supervision Gait Distance (Feet): 175 Feet Assistive device: Rolling walker (2 wheeled) Gait Pattern/deviations: Step-through pattern;Decreased stride length;Trunk flexed;Narrow base of support Gait velocity: Decreased Gait velocity interpretation: <1.8 ft/sec, indicate of risk for recurrent falls General Gait Details: Pt ambulating very slow and with very small step/stride length. Initially pt requiring min guard assist with progression to supervision for safety by end of gait training. Pt requires occasional cues for walker management due to low vision.    Stairs             Wheelchair Mobility    Modified Rankin (Stroke Patients Only) Modified Rankin (Stroke Patients Only) Pre-Morbid Rankin Score: Slight disability Modified Rankin: Moderately severe disability     Balance Overall balance assessment: Needs assistance Sitting-balance support: Feet unsupported;No upper extremity supported Sitting balance-Leahy Scale: Fair Sitting balance - Comments: Pt reaching down while sitting to show therapist her scar under her sock on the L side.    Standing balance support: No upper extremity supported;During functional activity Standing balance-Leahy Scale: Fair Standing balance comment: Pt able to lean over and wet head in the sink without LOB                            Cognition Arousal/Alertness: Awake/alert Behavior During Therapy: WFL for tasks assessed/performed Overall Cognitive Status: Within Functional Limits for tasks assessed  General Comments: Pt very independent however is aware of risks.  Pt has allowed dtr to hire live in companion who can assist as needed      Exercises      General Comments        Pertinent  Vitals/Pain Pain Assessment: No/denies pain    Home Living                      Prior Function            PT Goals (current goals can now be found in the care plan section) Acute Rehab PT Goals Patient Stated Goal: Be able to walk to her mailbox and back  PT Goal Formulation: With patient/family Time For Goal Achievement: 07/16/18 Potential to Achieve Goals: Good Progress towards PT goals: Progressing toward goals    Frequency    Min 4X/week      PT Plan Current plan remains appropriate    Co-evaluation              AM-PAC PT "6 Clicks" Mobility   Outcome Measure  Help needed turning from your back to your side while in a flat bed without using bedrails?: None Help needed moving from lying on your back to sitting on the side of a flat bed without using bedrails?: None Help needed moving to and from a bed to a chair (including a wheelchair)?: A Little Help needed standing up from a chair using your arms (e.g., wheelchair or bedside chair)?: A Little Help needed to walk in hospital room?: A Little Help needed climbing 3-5 steps with a railing? : A Little 6 Click Score: 20    End of Session Equipment Utilized During Treatment: Gait belt Activity Tolerance: Patient tolerated treatment well Patient left: in bed;with call bell/phone within reach Nurse Communication: Mobility status PT Visit Diagnosis: Unsteadiness on feet (R26.81);Other symptoms and signs involving the nervous system (N00.370)     Time: 4888-9169 PT Time Calculation (min) (ACUTE ONLY): 33 min  Charges:  $Gait Training: 23-37 mins                     Rolinda Roan, PT, DPT Acute Rehabilitation Services Pager: [phone] Office: [phone]    Thelma Comp 07/03/2018, 10:48 AM

## 2018-07-04 DIAGNOSIS — F4322 Adjustment disorder with anxiety: Secondary | ICD-10-CM | POA: Diagnosis not present

## 2018-07-04 DIAGNOSIS — I442 Atrioventricular block, complete: Secondary | ICD-10-CM | POA: Diagnosis not present

## 2018-07-04 DIAGNOSIS — H33002 Unspecified retinal detachment with retinal break, left eye: Secondary | ICD-10-CM | POA: Diagnosis not present

## 2018-07-04 DIAGNOSIS — R441 Visual hallucinations: Secondary | ICD-10-CM | POA: Diagnosis not present

## 2018-07-04 DIAGNOSIS — H548 Legal blindness, as defined in USA: Secondary | ICD-10-CM | POA: Diagnosis not present

## 2018-07-04 DIAGNOSIS — H4720 Unspecified optic atrophy: Secondary | ICD-10-CM | POA: Diagnosis not present

## 2018-07-05 ENCOUNTER — Ambulatory Visit: Payer: Medicare Other | Admitting: Neurology

## 2018-07-05 DIAGNOSIS — R441 Visual hallucinations: Secondary | ICD-10-CM | POA: Diagnosis not present

## 2018-07-05 DIAGNOSIS — I442 Atrioventricular block, complete: Secondary | ICD-10-CM | POA: Diagnosis not present

## 2018-07-05 DIAGNOSIS — H548 Legal blindness, as defined in USA: Secondary | ICD-10-CM | POA: Diagnosis not present

## 2018-07-05 DIAGNOSIS — H33002 Unspecified retinal detachment with retinal break, left eye: Secondary | ICD-10-CM | POA: Diagnosis not present

## 2018-07-05 DIAGNOSIS — H4720 Unspecified optic atrophy: Secondary | ICD-10-CM | POA: Diagnosis not present

## 2018-07-05 DIAGNOSIS — F4322 Adjustment disorder with anxiety: Secondary | ICD-10-CM | POA: Diagnosis not present

## 2018-07-08 DIAGNOSIS — H4720 Unspecified optic atrophy: Secondary | ICD-10-CM | POA: Diagnosis not present

## 2018-07-08 DIAGNOSIS — H548 Legal blindness, as defined in USA: Secondary | ICD-10-CM | POA: Diagnosis not present

## 2018-07-08 DIAGNOSIS — F4322 Adjustment disorder with anxiety: Secondary | ICD-10-CM | POA: Diagnosis not present

## 2018-07-08 DIAGNOSIS — R441 Visual hallucinations: Secondary | ICD-10-CM | POA: Diagnosis not present

## 2018-07-08 DIAGNOSIS — H33002 Unspecified retinal detachment with retinal break, left eye: Secondary | ICD-10-CM | POA: Diagnosis not present

## 2018-07-08 DIAGNOSIS — I442 Atrioventricular block, complete: Secondary | ICD-10-CM | POA: Diagnosis not present

## 2018-07-10 DIAGNOSIS — I442 Atrioventricular block, complete: Secondary | ICD-10-CM | POA: Diagnosis not present

## 2018-07-10 DIAGNOSIS — H4720 Unspecified optic atrophy: Secondary | ICD-10-CM | POA: Diagnosis not present

## 2018-07-10 DIAGNOSIS — H548 Legal blindness, as defined in USA: Secondary | ICD-10-CM | POA: Diagnosis not present

## 2018-07-10 DIAGNOSIS — H33002 Unspecified retinal detachment with retinal break, left eye: Secondary | ICD-10-CM | POA: Diagnosis not present

## 2018-07-10 DIAGNOSIS — F4322 Adjustment disorder with anxiety: Secondary | ICD-10-CM | POA: Diagnosis not present

## 2018-07-10 DIAGNOSIS — R441 Visual hallucinations: Secondary | ICD-10-CM | POA: Diagnosis not present

## 2018-07-11 ENCOUNTER — Telehealth: Payer: Self-pay

## 2018-07-11 DIAGNOSIS — F4322 Adjustment disorder with anxiety: Secondary | ICD-10-CM | POA: Diagnosis not present

## 2018-07-11 DIAGNOSIS — H33002 Unspecified retinal detachment with retinal break, left eye: Secondary | ICD-10-CM | POA: Diagnosis not present

## 2018-07-11 DIAGNOSIS — H548 Legal blindness, as defined in USA: Secondary | ICD-10-CM | POA: Diagnosis not present

## 2018-07-11 DIAGNOSIS — H4720 Unspecified optic atrophy: Secondary | ICD-10-CM | POA: Diagnosis not present

## 2018-07-11 DIAGNOSIS — R441 Visual hallucinations: Secondary | ICD-10-CM | POA: Diagnosis not present

## 2018-07-11 DIAGNOSIS — I442 Atrioventricular block, complete: Secondary | ICD-10-CM | POA: Diagnosis not present

## 2018-07-11 NOTE — Telephone Encounter (Signed)
Spoke to patient she stated she was recently in hospital with a stroke.Stated she is at home recovering.Stated she is receiving PT at her home.Stated in hospital Dr told her to stop warfarin and start on Eliquis 2.5 mg twice a day.Stated she tried to tell him she could not take Eliquis it made her sick,but he said to try.Stated she cannot take makes her nauseated she want to go back on warfarin.Stated she still has warfarin 4 mg tablets.Advised I will send message to our pharmacist.

## 2018-07-12 DIAGNOSIS — H6123 Impacted cerumen, bilateral: Secondary | ICD-10-CM | POA: Diagnosis not present

## 2018-07-12 DIAGNOSIS — Z8673 Personal history of transient ischemic attack (TIA), and cerebral infarction without residual deficits: Secondary | ICD-10-CM | POA: Diagnosis not present

## 2018-07-12 DIAGNOSIS — Z79899 Other long term (current) drug therapy: Secondary | ICD-10-CM | POA: Diagnosis not present

## 2018-07-12 DIAGNOSIS — Z7901 Long term (current) use of anticoagulants: Secondary | ICD-10-CM | POA: Diagnosis not present

## 2018-07-12 DIAGNOSIS — E538 Deficiency of other specified B group vitamins: Secondary | ICD-10-CM | POA: Diagnosis not present

## 2018-07-12 DIAGNOSIS — Z66 Do not resuscitate: Secondary | ICD-10-CM | POA: Diagnosis not present

## 2018-07-12 DIAGNOSIS — D649 Anemia, unspecified: Secondary | ICD-10-CM | POA: Diagnosis not present

## 2018-07-12 NOTE — Telephone Encounter (Signed)
Pt discharge on Eliquis due to suspected stroke with therapeutic INR (INR 2.2 on admission). I agree with discharging team that Eliquis is likely more ideal to reduce risk of stroke moving forward.   If would like to transition back to warfarin will need lovenox while making transition due to recent stroke.   Spoke with patient and explained the benefit of Eliquis over warfarin from a stroke risk. She states she is willing to try to continue Eliquis for now and will discuss with Dr. [name] at upcoming visit. She has been experiencing acidic burps about an hour after each dose and otherwise tolerating well.   She will call back if additional questions or if completely unable to tolerate Eliquis. She requests that 90 day supply be sent to pharmacy after visit with Dr. [name]. Advised her to request this during visit.

## 2018-07-16 DIAGNOSIS — H4720 Unspecified optic atrophy: Secondary | ICD-10-CM | POA: Diagnosis not present

## 2018-07-16 DIAGNOSIS — I442 Atrioventricular block, complete: Secondary | ICD-10-CM | POA: Diagnosis not present

## 2018-07-16 DIAGNOSIS — H548 Legal blindness, as defined in USA: Secondary | ICD-10-CM | POA: Diagnosis not present

## 2018-07-16 DIAGNOSIS — H33002 Unspecified retinal detachment with retinal break, left eye: Secondary | ICD-10-CM | POA: Diagnosis not present

## 2018-07-16 DIAGNOSIS — F4322 Adjustment disorder with anxiety: Secondary | ICD-10-CM | POA: Diagnosis not present

## 2018-07-16 DIAGNOSIS — R441 Visual hallucinations: Secondary | ICD-10-CM | POA: Diagnosis not present

## 2018-07-17 ENCOUNTER — Telehealth: Payer: Medicare Other | Admitting: Cardiology

## 2018-07-17 ENCOUNTER — Ambulatory Visit: Payer: Medicare Other | Admitting: Cardiology

## 2018-07-17 DIAGNOSIS — F4322 Adjustment disorder with anxiety: Secondary | ICD-10-CM | POA: Diagnosis not present

## 2018-07-17 DIAGNOSIS — I442 Atrioventricular block, complete: Secondary | ICD-10-CM | POA: Diagnosis not present

## 2018-07-17 DIAGNOSIS — H548 Legal blindness, as defined in USA: Secondary | ICD-10-CM | POA: Diagnosis not present

## 2018-07-17 DIAGNOSIS — H4720 Unspecified optic atrophy: Secondary | ICD-10-CM | POA: Diagnosis not present

## 2018-07-17 DIAGNOSIS — R441 Visual hallucinations: Secondary | ICD-10-CM | POA: Diagnosis not present

## 2018-07-17 DIAGNOSIS — H33002 Unspecified retinal detachment with retinal break, left eye: Secondary | ICD-10-CM | POA: Diagnosis not present

## 2018-07-25 DIAGNOSIS — H33002 Unspecified retinal detachment with retinal break, left eye: Secondary | ICD-10-CM | POA: Diagnosis not present

## 2018-07-25 DIAGNOSIS — I442 Atrioventricular block, complete: Secondary | ICD-10-CM | POA: Diagnosis not present

## 2018-07-25 DIAGNOSIS — H548 Legal blindness, as defined in USA: Secondary | ICD-10-CM | POA: Diagnosis not present

## 2018-07-25 DIAGNOSIS — F4322 Adjustment disorder with anxiety: Secondary | ICD-10-CM | POA: Diagnosis not present

## 2018-07-25 DIAGNOSIS — R441 Visual hallucinations: Secondary | ICD-10-CM | POA: Diagnosis not present

## 2018-07-25 DIAGNOSIS — H4720 Unspecified optic atrophy: Secondary | ICD-10-CM | POA: Diagnosis not present

## 2018-07-31 ENCOUNTER — Ambulatory Visit (INDEPENDENT_AMBULATORY_CARE_PROVIDER_SITE_OTHER): Payer: Medicare Other | Admitting: *Deleted

## 2018-07-31 DIAGNOSIS — I495 Sick sinus syndrome: Secondary | ICD-10-CM | POA: Diagnosis not present

## 2018-07-31 DIAGNOSIS — E878 Other disorders of electrolyte and fluid balance, not elsewhere classified: Secondary | ICD-10-CM | POA: Diagnosis not present

## 2018-08-01 ENCOUNTER — Telehealth: Payer: Self-pay

## 2018-08-01 LAB — CUP PACEART REMOTE DEVICE CHECK
Battery Impedance: 978 Ohm
Battery Remaining Longevity: 66 mo
Battery Voltage: 2.77 V
Brady Statistic AP VP Percent: 0 %
Brady Statistic AP VS Percent: 52 %
Brady Statistic AS VP Percent: 0 %
Brady Statistic AS VS Percent: 48 %
Date Time Interrogation Session: 20200528142333
Implantable Lead Implant Date: 20040309
Implantable Lead Implant Date: 20040309
Implantable Lead Location: 753859
Implantable Lead Location: 753860
Implantable Lead Model: 4458
Implantable Lead Serial Number: 304362
Implantable Pulse Generator Implant Date: 20140409
Lead Channel Impedance Value: 1017 Ohm
Lead Channel Impedance Value: 627 Ohm
Lead Channel Pacing Threshold Amplitude: 0.375 V
Lead Channel Pacing Threshold Amplitude: 0.75 V
Lead Channel Pacing Threshold Pulse Width: 0.4 ms
Lead Channel Pacing Threshold Pulse Width: 0.4 ms
Lead Channel Sensing Intrinsic Amplitude: 11.2 mV
Lead Channel Setting Pacing Amplitude: 2 V
Lead Channel Setting Pacing Amplitude: 2.5 V
Lead Channel Setting Pacing Pulse Width: 0.4 ms
Lead Channel Setting Sensing Sensitivity: 5.6 mV

## 2018-08-01 NOTE — Telephone Encounter (Signed)
Spoke with patient to remind of missed remote transmission 

## 2018-08-06 ENCOUNTER — Ambulatory Visit: Payer: Self-pay | Admitting: Pharmacist Clinician (PhC)/ Clinical Pharmacy Specialist

## 2018-08-06 DIAGNOSIS — Z5181 Encounter for therapeutic drug level monitoring: Secondary | ICD-10-CM

## 2018-08-06 DIAGNOSIS — I4819 Other persistent atrial fibrillation: Secondary | ICD-10-CM

## 2018-08-08 ENCOUNTER — Encounter: Payer: Self-pay | Admitting: Cardiology

## 2018-08-08 NOTE — Progress Notes (Signed)
Remote pacemaker transmission.   

## 2018-10-21 DIAGNOSIS — H04123 Dry eye syndrome of bilateral lacrimal glands: Secondary | ICD-10-CM | POA: Diagnosis not present

## 2018-10-21 DIAGNOSIS — H0102B Squamous blepharitis left eye, upper and lower eyelids: Secondary | ICD-10-CM | POA: Diagnosis not present

## 2018-10-21 DIAGNOSIS — H1851 Endothelial corneal dystrophy: Secondary | ICD-10-CM | POA: Diagnosis not present

## 2018-10-21 DIAGNOSIS — H4720 Unspecified optic atrophy: Secondary | ICD-10-CM | POA: Diagnosis not present

## 2018-10-21 DIAGNOSIS — H0102A Squamous blepharitis right eye, upper and lower eyelids: Secondary | ICD-10-CM | POA: Diagnosis not present

## 2018-10-21 DIAGNOSIS — H1859 Other hereditary corneal dystrophies: Secondary | ICD-10-CM | POA: Diagnosis not present

## 2018-10-21 DIAGNOSIS — Z961 Presence of intraocular lens: Secondary | ICD-10-CM | POA: Diagnosis not present

## 2018-10-30 ENCOUNTER — Ambulatory Visit (INDEPENDENT_AMBULATORY_CARE_PROVIDER_SITE_OTHER): Payer: Medicare Other | Admitting: *Deleted

## 2018-10-30 DIAGNOSIS — I4819 Other persistent atrial fibrillation: Secondary | ICD-10-CM

## 2018-10-30 DIAGNOSIS — I495 Sick sinus syndrome: Secondary | ICD-10-CM | POA: Diagnosis not present

## 2018-10-30 LAB — CUP PACEART REMOTE DEVICE CHECK
Battery Impedance: 1057 Ohm
Battery Remaining Longevity: 62 mo
Battery Voltage: 2.77 V
Brady Statistic AP VP Percent: 0 %
Brady Statistic AP VS Percent: 53 %
Brady Statistic AS VP Percent: 0 %
Brady Statistic AS VS Percent: 47 %
Date Time Interrogation Session: 20200826172514
Implantable Lead Implant Date: 20040309
Implantable Lead Implant Date: 20040309
Implantable Lead Location: 753859
Implantable Lead Location: 753860
Implantable Lead Model: 4458
Implantable Lead Serial Number: 304362
Implantable Pulse Generator Implant Date: 20140409
Lead Channel Impedance Value: 1017 Ohm
Lead Channel Impedance Value: 619 Ohm
Lead Channel Pacing Threshold Amplitude: 0.5 V
Lead Channel Pacing Threshold Amplitude: 0.75 V
Lead Channel Pacing Threshold Pulse Width: 0.4 ms
Lead Channel Pacing Threshold Pulse Width: 0.4 ms
Lead Channel Setting Pacing Amplitude: 2 V
Lead Channel Setting Pacing Amplitude: 2.5 V
Lead Channel Setting Pacing Pulse Width: 0.4 ms
Lead Channel Setting Sensing Sensitivity: 5.6 mV

## 2018-11-04 ENCOUNTER — Other Ambulatory Visit: Payer: Self-pay | Admitting: Cardiology

## 2018-11-05 NOTE — Telephone Encounter (Signed)
Pt calling again about her medication Eliquis, needing a refill, pt out of medication. Please address

## 2018-11-06 MED ORDER — APIXABAN 2.5 MG PO TABS: 2.5000 mg | ORAL_TABLET | Freq: Two times a day (BID) | ORAL | 1 refills | Status: AC

## 2018-11-06 NOTE — Telephone Encounter (Signed)
57f 51.4kg Scr 1.33 07/01/18 Lovw/meng 04/05/18 Pt requesting refill of a eliquis 2.5mg  which they have a documented allergy to that causes them nausea needs a pharmd review.

## 2018-11-06 NOTE — Telephone Encounter (Signed)
Pt's care giver called requesting refill again x 3rd day, states pt has been out of Eliquis x 3 days now with Hx of afib and TIA.  She states pt had nausea when she first started on Eliquis, but has been taking it for sometime now without any side effects.  Documented nausea was not at true allergy.   Pt last saw Almyra Deforest, [place] on 04/05/18, last labs 07/01/18 Creat 1.33, age 85, weight 50.3kg, based on specified criteria pt is on appropriate dosage of Eliquis 2.5mg  BID.  Will refill rx. 90 day supply with 1 refill sent to [facility] per pt request.

## 2018-11-08 ENCOUNTER — Other Ambulatory Visit: Payer: Self-pay | Admitting: Cardiology

## 2018-11-08 NOTE — Progress Notes (Signed)
Remote pacemaker transmission.   

## 2018-11-15 DIAGNOSIS — Z23 Encounter for immunization: Secondary | ICD-10-CM | POA: Diagnosis not present

## 2018-11-15 DIAGNOSIS — R2681 Unsteadiness on feet: Secondary | ICD-10-CM | POA: Diagnosis not present

## 2018-11-15 DIAGNOSIS — D696 Thrombocytopenia, unspecified: Secondary | ICD-10-CM | POA: Diagnosis not present

## 2018-11-15 DIAGNOSIS — I6930 Unspecified sequelae of cerebral infarction: Secondary | ICD-10-CM | POA: Diagnosis not present

## 2018-11-15 DIAGNOSIS — E878 Other disorders of electrolyte and fluid balance, not elsewhere classified: Secondary | ICD-10-CM | POA: Diagnosis not present

## 2018-11-15 DIAGNOSIS — R296 Repeated falls: Secondary | ICD-10-CM | POA: Diagnosis not present

## 2018-11-15 DIAGNOSIS — R7989 Other specified abnormal findings of blood chemistry: Secondary | ICD-10-CM | POA: Diagnosis not present

## 2018-11-28 ENCOUNTER — Emergency Department (HOSPITAL_COMMUNITY)
Admission: EM | Admit: 2018-11-28 | Discharge: 2018-11-28 | Disposition: A | Discharge status: Home / Self Care | Payer: Medicare Other | Attending: Emergency Medicine | Admitting: Emergency Medicine

## 2018-11-28 ENCOUNTER — Other Ambulatory Visit: Payer: Self-pay

## 2018-11-28 ENCOUNTER — Emergency Department (HOSPITAL_COMMUNITY): Payer: Medicare Other

## 2018-11-28 ENCOUNTER — Other Ambulatory Visit: Payer: Self-pay | Admitting: Neurology

## 2018-11-28 DIAGNOSIS — Y92008 Other place in unspecified non-institutional (private) residence as the place of occurrence of the external cause: Secondary | ICD-10-CM | POA: Diagnosis not present

## 2018-11-28 DIAGNOSIS — E039 Hypothyroidism, unspecified: Secondary | ICD-10-CM | POA: Insufficient documentation

## 2018-11-28 DIAGNOSIS — S0093XA Contusion of unspecified part of head, initial encounter: Secondary | ICD-10-CM | POA: Diagnosis not present

## 2018-11-28 DIAGNOSIS — Z7901 Long term (current) use of anticoagulants: Secondary | ICD-10-CM | POA: Insufficient documentation

## 2018-11-28 DIAGNOSIS — S0990XA Unspecified injury of head, initial encounter: Secondary | ICD-10-CM | POA: Diagnosis not present

## 2018-11-28 DIAGNOSIS — R0789 Other chest pain: Secondary | ICD-10-CM | POA: Insufficient documentation

## 2018-11-28 DIAGNOSIS — Z79899 Other long term (current) drug therapy: Secondary | ICD-10-CM | POA: Diagnosis not present

## 2018-11-28 DIAGNOSIS — R0781 Pleurodynia: Secondary | ICD-10-CM | POA: Diagnosis not present

## 2018-11-28 DIAGNOSIS — I442 Atrioventricular block, complete: Secondary | ICD-10-CM | POA: Insufficient documentation

## 2018-11-28 DIAGNOSIS — Y999 Unspecified external cause status: Secondary | ICD-10-CM | POA: Insufficient documentation

## 2018-11-28 DIAGNOSIS — I480 Paroxysmal atrial fibrillation: Secondary | ICD-10-CM | POA: Insufficient documentation

## 2018-11-28 DIAGNOSIS — Z950 Presence of cardiac pacemaker: Secondary | ICD-10-CM | POA: Insufficient documentation

## 2018-11-28 DIAGNOSIS — Z7401 Bed confinement status: Secondary | ICD-10-CM | POA: Diagnosis not present

## 2018-11-28 DIAGNOSIS — M25552 Pain in left hip: Secondary | ICD-10-CM | POA: Diagnosis not present

## 2018-11-28 DIAGNOSIS — Y939 Activity, unspecified: Secondary | ICD-10-CM | POA: Diagnosis not present

## 2018-11-28 DIAGNOSIS — R52 Pain, unspecified: Secondary | ICD-10-CM | POA: Diagnosis not present

## 2018-11-28 DIAGNOSIS — S79912A Unspecified injury of left hip, initial encounter: Secondary | ICD-10-CM | POA: Diagnosis not present

## 2018-11-28 DIAGNOSIS — W19XXXA Unspecified fall, initial encounter: Secondary | ICD-10-CM | POA: Diagnosis not present

## 2018-11-28 DIAGNOSIS — S299XXA Unspecified injury of thorax, initial encounter: Secondary | ICD-10-CM | POA: Diagnosis not present

## 2018-11-28 DIAGNOSIS — M2550 Pain in unspecified joint: Secondary | ICD-10-CM | POA: Diagnosis not present

## 2018-11-28 DIAGNOSIS — R51 Headache: Secondary | ICD-10-CM | POA: Insufficient documentation

## 2018-11-28 DIAGNOSIS — R42 Dizziness and giddiness: Secondary | ICD-10-CM | POA: Diagnosis not present

## 2018-11-28 LAB — BASIC METABOLIC PANEL
Anion gap: 10 (ref 5–15)
BUN: 26 mg/dL — ABNORMAL HIGH (ref 8–23)
CO2: 25 mmol/L (ref 22–32)
Calcium: 8.8 mg/dL — ABNORMAL LOW (ref 8.9–10.3)
Chloride: 107 mmol/L (ref 98–111)
Creatinine, Ser: 1.36 mg/dL — ABNORMAL HIGH (ref 0.44–1.00)
GFR calc Af Amer: 40 mL/min — ABNORMAL LOW (ref >60)
GFR calc non Af Amer: 35 mL/min — ABNORMAL LOW (ref >60)
Glucose, Bld: 103 mg/dL — ABNORMAL HIGH (ref 70–99)
Potassium: 3.8 mmol/L (ref 3.5–5.1)
Sodium: 142 mmol/L (ref 135–145)

## 2018-11-28 LAB — CBC WITH DIFFERENTIAL/PLATELET
Abs Immature Granulocytes: 0.03 10*3/uL (ref 0.00–0.07)
Basophils Absolute: 0 10*3/uL (ref 0.0–0.1)
Basophils Relative: 0 %
Eosinophils Absolute: 0 10*3/uL (ref 0.0–0.5)
Eosinophils Relative: 1 %
HCT: 42.2 % (ref 36.0–46.0)
Hemoglobin: 13.3 g/dL (ref 12.0–15.0)
Immature Granulocytes: 0 %
Lymphocytes Relative: 18 %
Lymphs Abs: 1.2 10*3/uL (ref 0.7–4.0)
MCH: 31.6 pg (ref 26.0–34.0)
MCHC: 31.5 g/dL (ref 30.0–36.0)
MCV: 100.2 fL — ABNORMAL HIGH (ref 80.0–100.0)
Monocytes Absolute: 0.7 10*3/uL (ref 0.1–1.0)
Monocytes Relative: 10 %
Neutro Abs: 4.8 10*3/uL (ref 1.7–7.7)
Neutrophils Relative %: 71 %
Platelets: 109 10*3/uL — ABNORMAL LOW (ref 150–400)
RBC: 4.21 MIL/uL (ref 3.87–5.11)
RDW: 13.8 % (ref 11.5–15.5)
WBC: 6.7 10*3/uL (ref 4.0–10.5)
nRBC: 0 % (ref 0.0–0.2)

## 2018-11-28 MED ORDER — LIDOCAINE 5 % EX PTCH
1.0000 | MEDICATED_PATCH | CUTANEOUS | Status: DC
  Administered 2018-11-28: 1 via TRANSDERMAL
  Filled 2018-11-28: qty 1

## 2018-11-28 NOTE — ED Provider Notes (Signed)
[facility] EMERGENCY DEPARTMENT Provider Note   CSN: [id number] Arrival date & time: 11/28/18  0732     History   Chief Complaint Chief Complaint  Patient presents with   Fall    HPI Katrina Rodriguez is a 85 y.o. female.     Patient with history of atrial fibrillation on blood thinners who presents the ED after mechanical fall at home.  Patient hit the back of her head, left side of her ribs.  Uses a cane at home to ambulate.  Was able to walk afterwards but continues to have headache.  Denies any nausea, vomiting.  The history is provided by the patient.  Fall This is a new problem. The current episode started 3 to 5 hours ago. The problem has been resolved. Associated symptoms include chest pain (left side of ribs) and headaches. Pertinent negatives include no abdominal pain and no shortness of breath. The symptoms are aggravated by walking. Nothing relieves the symptoms. She has tried nothing for the symptoms. The treatment provided no relief.    Past Medical History:  Diagnosis Date   Arthritis    Atrial fibrillation [facility])    History of ablation - Dr. Rayann Heman   Chest pain    Has normal coronaries per cath in 2005; negative nuclear in 2010   Chronic anemia    Complete heart block ([place]) 06/09/2013   H/O: CVA (cardiovascular accident)    Right internal capsule   Hypothyroidism 06/19/2017   Left-sided weakness 06/19/2017   Mild pulmonary hypertension ([place])    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   Orthostasis    Retinal detachment    Thrombocytopenia ([place])    TIA (transient ischemic attack)     Patient Active Problem List   Diagnosis Date Noted   TIA (transient ischemic attack) 07/01/2018   Adjustment disorder with anxious mood 05/26/2018   Hypothyroidism 06/19/2017   Left-sided weakness 06/19/2017   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   Encounter for therapeutic drug monitoring 06/09/2013   Complete heart block ([place])  06/09/2013   Syncope 04/24/2013   H/O    PAROXYSMAL ATRIAL FIBRILLATION 02/02/2010   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   DEEP VENOUS THROMBOPHLEBITIS 09/17/2008   HYPOTENSION, ORTHOSTATIC 09/17/2008   SYNCOPE 09/17/2008   CHEST PAIN 09/17/2008   PPM-[employer] 09/17/2008    Past Surgical History:  Procedure Laterality Date   ATRIAL FIBRILLATION ABLATION     CARDIAC CATHETERIZATION     CARDIAC PACEMAKER PLACEMENT     PERMANENT PACEMAKER GENERATOR CHANGE N/A 06/12/2012   Procedure: PERMANENT PACEMAKER GENERATOR CHANGE;  Surgeon: Evans Lance, MD;  Location: [facility] CATH LAB;  Service: Cardiovascular;  Laterality: N/A;   RETINAL DETACHMENT SURGERY       OB History   No obstetric history on file.      Home Medications    Prior to Admission medications   Medication Sig Start Date End Date Taking? Authorizing Provider  acetaminophen (TYLENOL) 500 MG tablet Take 500 mg by mouth every 6 (six) hours as needed for moderate pain.    [provider]  apixaban (ELIQUIS) 2.5 MG TABS tablet Take 1 tablet (2.5 mg total) by mouth 2 (two) times daily. 11/06/18   [name], Peter M, MD  dofetilide (TIKOSYN) 250 MCG capsule TAKE 1 CAPSULE (250 MCG TOTAL) BY MOUTH 2 (TWO) TIMES DAILY. 11/08/18   Almyra Deforest, PA  fludrocortisone (FLORINEF) 0.1 MG tablet Take 1 tablet (0.1 mg total) by mouth 2 (two) times daily. 05/21/18  [name], Peter M, MD  potassium chloride SA (K-DUR,KLOR-CON) 20 MEQ tablet Take 2 tablets (40 mEq total) by mouth daily. Patient taking differently: Take 20 mEq by mouth daily.  02/06/18   [name], Peter M, MD  QUEtiapine (SEROQUEL) 25 MG tablet TAKE 1/2 TABLET BY MOUTH EVERY MORNING AND 1 TABLET AT NIGHT 11/28/18   Kathrynn Ducking, MD  rosuvastatin (CRESTOR) 10 MG tablet Take 1 tablet (10 mg total) by mouth daily. 04/29/18 07/28/18  [name], Peter M, MD    Family History Family History  Problem Relation Age of Onset   Heart disease Father    Stroke Father     Heart attack Father    Heart disease Brother    Heart disease Brother    Hypertension Mother    Diabetes Sister    Heart attack Sister     Social History Social History   Tobacco Use   Smoking status: Never Smoker   Smokeless tobacco: Never Used  Substance Use Topics   Alcohol use: Never    Frequency: Never   Drug use: Never     Allergies   Amiodarone, Amoxicillin, Aspirin, Codeine, Darvocet [propoxyphene n-acetaminophen], Demerol, Eliquis [apixaban], Penicillins, and Xarelto [rivaroxaban]   Review of Systems Review of Systems  Constitutional: Negative for chills and fever.  HENT: Negative for ear pain and sore throat.   Eyes: Negative for pain and visual disturbance.  Respiratory: Negative for cough and shortness of breath.   Cardiovascular: Positive for chest pain (left side of ribs). Negative for palpitations.  Gastrointestinal: Negative for abdominal pain and vomiting.  Genitourinary: Negative for dysuria and hematuria.  Musculoskeletal: Negative for arthralgias and back pain.  Skin: Negative for color change and rash.  Neurological: Positive for headaches. Negative for dizziness, tremors, seizures, syncope, facial asymmetry, speech difficulty, weakness, light-headedness and numbness.  All other systems reviewed and are negative.    Physical Exam Updated Vital Signs  ED Triage Vitals  Enc Vitals Group     BP 11/28/18 0743 (!) 149/69     Pulse Rate 11/28/18 0743 69     Resp 11/28/18 0743 12     Temp 11/28/18 0743 98.3 F (36.8 C)     Temp Source 11/28/18 0743 Oral     SpO2 11/28/18 0734 99 %     Weight --      Height --      Head Circumference --      Peak Flow --      Pain Score 11/28/18 0737 10     Pain Loc --      Pain Edu? --      Excl. in [place]? --     Physical Exam Vitals signs and nursing note reviewed.  Constitutional:      General: She is not in acute distress.    Appearance: She is well-developed.  HENT:     Head: Normocephalic  and atraumatic.     Nose: Nose normal.     Mouth/Throat:     Mouth: Mucous membranes are moist.  Eyes:     Extraocular Movements: Extraocular movements intact.     Conjunctiva/sclera: Conjunctivae normal.  Neck:     Musculoskeletal: Normal range of motion and neck supple. No muscular tenderness.  Cardiovascular:     Rate and Rhythm: Normal rate and regular rhythm.     Pulses: Normal pulses.     Heart sounds: Normal heart sounds. No murmur.  Pulmonary:     Effort: Pulmonary effort is normal. No respiratory  distress.     Breath sounds: Normal breath sounds.  Abdominal:     Palpations: Abdomen is soft.     Tenderness: There is no abdominal tenderness.  Musculoskeletal: Normal range of motion.        General: Tenderness (left side of ribs) present.     Comments: No midline spinal pain  Skin:    General: Skin is warm and dry.     Capillary Refill: Capillary refill takes less than 2 seconds.  Neurological:     General: No focal deficit present.     Mental Status: She is alert and oriented to person, place, and time.     Cranial Nerves: No cranial nerve deficit.     Sensory: No sensory deficit.     Motor: No weakness.     Coordination: Coordination normal.      ED Treatments / Results  Labs (all labs ordered are listed, but only abnormal results are displayed) Labs Reviewed  CBC WITH DIFFERENTIAL/PLATELET - Abnormal; Notable for the following components:      Result Value   MCV 100.2 (*)    Platelets 109 (*)    All other components within normal limits  BASIC METABOLIC PANEL - Abnormal; Notable for the following components:   Glucose, Bld 103 (*)    BUN 26 (*)    Creatinine, Ser 1.36 (*)    Calcium 8.8 (*)    GFR calc non Af Amer 35 (*)    GFR calc Af Amer 40 (*)    All other components within normal limits    EKG EKG Interpretation  Date/Time:  Thursday November 28 2018 07:44:20 EDT Ventricular Rate:  67 PR Interval:    QRS Duration: 100 QT Interval:  443 QTC  Calculation: 468 R Axis:   26 Text Interpretation:  Sinus rhythm Abnormal R-wave progression, early transition Confirmed by Lennice Sites [phone]) on 11/28/2018 8:03:54 AM   Radiology Dg Chest 2 View  Result Date: 11/28/2018 CLINICAL DATA:  Fall today. EXAM: CHEST - 2 VIEW COMPARISON:  Radiographs of May 25, 2018. FINDINGS: Stable cardiomediastinal silhouette. Atherosclerosis of thoracic aorta is noted. No pneumothorax or pleural effusion is noted. Lungs are clear. Bony thorax is unremarkable. Left-sided pacemaker is unchanged. IMPRESSION: No active cardiopulmonary disease. Aortic Atherosclerosis (ICD10-I70.0). Electronically Signed   By: Marijo Conception M.D.   On: 11/28/2018 09:00   Ct Head Wo Contrast  Result Date: 11/28/2018 CLINICAL DATA:  Syncopal episode.  Fell. EXAM: CT HEAD WITHOUT CONTRAST CT CERVICAL SPINE WITHOUT CONTRAST TECHNIQUE: Multidetector CT imaging of the head and cervical spine was performed following the standard protocol without intravenous contrast. Multiplanar CT image reconstructions of the cervical spine were also generated. COMPARISON:  Head CT 07/02/2018 and neck CT 06/01/2018 FINDINGS: CT HEAD FINDINGS Brain: Stable age related cerebral atrophy, ventriculomegaly and periventricular white matter disease. No extra-axial fluid collections are identified. No CT findings for acute hemispheric infarction or intracranial hemorrhage. No mass lesions. The brainstem and cerebellum are normal. Vascular: Stable vascular calcifications. No aneurysm or hyperdense vessels. Skull: No acute skull fracture.  No bone lesions. Sinuses/Orbits: The paranasal sinuses and mastoid air cells are clear. Stable opacification of the left globe. Other: No scalp lesions or hematoma. CT CERVICAL SPINE FINDINGS Alignment: Normal. Skull base and vertebrae: No acute fracture. No primary bone lesion or focal pathologic process. Soft tissues and spinal canal: No prevertebral fluid or swelling. No visible canal  hematoma. Disc levels: Stable advanced degenerative cervical spondylosis with multilevel disc disease  and facet disease. The spinal canal is fairly generous in is no significant spinal stenosis. No large disc protrusions are identified. Moderate multilevel foraminal stenosis due to uncinate spurring and facet disease greater on the right than the left. Upper chest: The lung apices are grossly clear. Other: No other neck mass or adenopathy. Stable carotid artery calcifications on the left. IMPRESSION: 1. Stable age related cerebral atrophy, ventriculomegaly and periventricular white matter disease. 2. No acute intracranial findings or skull fracture. 3. Chronic opacification of the left globe. 4. Degenerative cervical spondylosis with multilevel disc disease and facet disease but no acute cervical spine fracture or spinal canal compromise. Electronically Signed   By: Marijo Sanes M.D.   On: 11/28/2018 09:20   Ct Cervical Spine Wo Contrast  Result Date: 11/28/2018 CLINICAL DATA:  Syncopal episode.  Fell. EXAM: CT HEAD WITHOUT CONTRAST CT CERVICAL SPINE WITHOUT CONTRAST TECHNIQUE: Multidetector CT imaging of the head and cervical spine was performed following the standard protocol without intravenous contrast. Multiplanar CT image reconstructions of the cervical spine were also generated. COMPARISON:  Head CT 07/02/2018 and neck CT 06/01/2018 FINDINGS: CT HEAD FINDINGS Brain: Stable age related cerebral atrophy, ventriculomegaly and periventricular white matter disease. No extra-axial fluid collections are identified. No CT findings for acute hemispheric infarction or intracranial hemorrhage. No mass lesions. The brainstem and cerebellum are normal. Vascular: Stable vascular calcifications. No aneurysm or hyperdense vessels. Skull: No acute skull fracture.  No bone lesions. Sinuses/Orbits: The paranasal sinuses and mastoid air cells are clear. Stable opacification of the left globe. Other: No scalp lesions or  hematoma. CT CERVICAL SPINE FINDINGS Alignment: Normal. Skull base and vertebrae: No acute fracture. No primary bone lesion or focal pathologic process. Soft tissues and spinal canal: No prevertebral fluid or swelling. No visible canal hematoma. Disc levels: Stable advanced degenerative cervical spondylosis with multilevel disc disease and facet disease. The spinal canal is fairly generous in is no significant spinal stenosis. No large disc protrusions are identified. Moderate multilevel foraminal stenosis due to uncinate spurring and facet disease greater on the right than the left. Upper chest: The lung apices are grossly clear. Other: No other neck mass or adenopathy. Stable carotid artery calcifications on the left. IMPRESSION: 1. Stable age related cerebral atrophy, ventriculomegaly and periventricular white matter disease. 2. No acute intracranial findings or skull fracture. 3. Chronic opacification of the left globe. 4. Degenerative cervical spondylosis with multilevel disc disease and facet disease but no acute cervical spine fracture or spinal canal compromise. Electronically Signed   By: Marijo Sanes M.D.   On: 11/28/2018 09:20   Dg Hip Unilat With Pelvis 2-3 Views Left  Result Date: 11/28/2018 CLINICAL DATA:  Left hip pain after fall. EXAM: DG HIP (WITH OR WITHOUT PELVIS) 2-3V LEFT COMPARISON:  Radiographs of December 09, 2013. FINDINGS: There is no evidence of hip fracture or dislocation. There is no evidence of arthropathy or other focal bone abnormality. IMPRESSION: Negative. Electronically Signed   By: Marijo Conception M.D.   On: 11/28/2018 09:02    Procedures Procedures (including critical care time)  Medications Ordered in ED Medications  lidocaine (LIDODERM) 5 % 1 patch (1 patch Transdermal Patch Applied 11/28/18 0801)     Initial Impression / Assessment and Plan / ED Course  I have reviewed the triage vital signs and the nursing notes.  Pertinent labs & imaging results that were  available during my care of the patient were reviewed by me and considered in my medical decision making (  see chart for details).        Katrina Rodriguez is an 85 year old female with history of atrial fibrillation on blood thinners who presents the ED after a fall.  Patient with normal vitals.  No fever.  Fall happened several hours ago.  Hit the back of her head and left side of her ribs.  Ambulates with a cane.  Lives by herself with home aids.  Patient came because she continues to have headache.  She was able to ambulate after the fall.  She states that she has low vision and tripped over a chair.  Patient has some tenderness to the left side of the chest wall but has clear breath sounds, no lacerations.  No bony tenderness otherwise.  No midline spinal pain.  Will get head imaging, neck imaging, chest x-ray, pelvis x-ray.  Will give lidocaine patch for pain.  Patient does not want narcotics or Tylenol.  EKG shows sinus rhythm.  No ischemic changes.  Lab work unremarkable.  CT of the head and neck were normal with no injuries.  X-ray of the chest and hip showed no acute injuries.  Discharged from ED in good condition.  Given return precautions.  This chart was dictated using voice recognition software.  Despite best efforts to proofread,  errors can occur which can change the documentation meaning.    Final Clinical Impressions(s) / ED Diagnoses   Final diagnoses:  Fall, initial encounter  Contusion of head, unspecified part of head, initial encounter    ED Discharge Orders    None       Lennice Sites, DO 11/28/18 [phone]

## 2018-11-28 NOTE — ED Triage Notes (Signed)
Pt sts she got up from bed too fast this morning, got dizzy, and fell. No LOC. Pt takes eliquis and thinks she hit her head. Pt c/o L rib cage pain. Fall 3-4 hours ago. Demand pacemaker in place. Pt sts this routinely happens where she stands too fast and gets dizzy.

## 2018-11-28 NOTE — ED Notes (Signed)
Patient transported to X-ray 

## 2018-11-28 NOTE — ED Notes (Addendum)
PTAR for transport.  

## 2018-11-29 DIAGNOSIS — E878 Other disorders of electrolyte and fluid balance, not elsewhere classified: Secondary | ICD-10-CM | POA: Diagnosis not present

## 2018-12-03 DIAGNOSIS — N183 Chronic kidney disease, stage 3 (moderate): Secondary | ICD-10-CM | POA: Diagnosis not present

## 2018-12-03 DIAGNOSIS — R079 Chest pain, unspecified: Secondary | ICD-10-CM | POA: Diagnosis not present

## 2018-12-03 DIAGNOSIS — R0789 Other chest pain: Secondary | ICD-10-CM | POA: Diagnosis not present

## 2018-12-03 DIAGNOSIS — M25512 Pain in left shoulder: Secondary | ICD-10-CM | POA: Diagnosis not present

## 2018-12-03 DIAGNOSIS — D696 Thrombocytopenia, unspecified: Secondary | ICD-10-CM | POA: Diagnosis not present

## 2018-12-06 DIAGNOSIS — R0781 Pleurodynia: Secondary | ICD-10-CM | POA: Diagnosis not present

## 2018-12-09 ENCOUNTER — Other Ambulatory Visit: Payer: Self-pay | Admitting: Neurology

## 2018-12-10 ENCOUNTER — Telehealth: Payer: Self-pay | Admitting: Neurology

## 2018-12-10 DIAGNOSIS — M25512 Pain in left shoulder: Secondary | ICD-10-CM | POA: Diagnosis not present

## 2018-12-10 DIAGNOSIS — D696 Thrombocytopenia, unspecified: Secondary | ICD-10-CM | POA: Diagnosis not present

## 2018-12-10 DIAGNOSIS — M800AXD Age-related osteoporosis with current pathological fracture, other site, subsequent encounter for fracture with routine healing: Secondary | ICD-10-CM | POA: Diagnosis not present

## 2018-12-10 DIAGNOSIS — Z7901 Long term (current) use of anticoagulants: Secondary | ICD-10-CM | POA: Diagnosis not present

## 2018-12-10 DIAGNOSIS — I69398 Other sequelae of cerebral infarction: Secondary | ICD-10-CM | POA: Diagnosis not present

## 2018-12-10 DIAGNOSIS — I4891 Unspecified atrial fibrillation: Secondary | ICD-10-CM | POA: Diagnosis not present

## 2018-12-10 DIAGNOSIS — N183 Chronic kidney disease, stage 3 unspecified: Secondary | ICD-10-CM | POA: Diagnosis not present

## 2018-12-10 DIAGNOSIS — H359 Unspecified retinal disorder: Secondary | ICD-10-CM | POA: Diagnosis not present

## 2018-12-10 DIAGNOSIS — H548 Legal blindness, as defined in USA: Secondary | ICD-10-CM | POA: Diagnosis not present

## 2018-12-10 DIAGNOSIS — Z9181 History of falling: Secondary | ICD-10-CM | POA: Diagnosis not present

## 2018-12-10 DIAGNOSIS — R0789 Other chest pain: Secondary | ICD-10-CM | POA: Diagnosis not present

## 2018-12-10 DIAGNOSIS — M1990 Unspecified osteoarthritis, unspecified site: Secondary | ICD-10-CM | POA: Diagnosis not present

## 2018-12-10 DIAGNOSIS — Z950 Presence of cardiac pacemaker: Secondary | ICD-10-CM | POA: Diagnosis not present

## 2018-12-10 DIAGNOSIS — R2681 Unsteadiness on feet: Secondary | ICD-10-CM | POA: Diagnosis not present

## 2018-12-10 DIAGNOSIS — I495 Sick sinus syndrome: Secondary | ICD-10-CM | POA: Diagnosis not present

## 2018-12-10 DIAGNOSIS — R441 Visual hallucinations: Secondary | ICD-10-CM | POA: Diagnosis not present

## 2018-12-10 MED ORDER — ARIPIPRAZOLE 2 MG PO TABS: ORAL_TABLET | ORAL | 3 refills | Status: DC

## 2018-12-10 NOTE — Telephone Encounter (Signed)
I reached out to the pt's daughter. I advised I have received the message and will be in touch once Dr. [name] has an opening. Right now he is full through the end of October.

## 2018-12-10 NOTE — Telephone Encounter (Signed)
Pt's daughter called and lvm stating that the QUEtiapine (SEROQUEL) 25 MG tablet is making the pt nauseous. Daughter would also like to discuss other situations with the RN or Provider. Please advise.

## 2018-12-10 NOTE — Telephone Encounter (Signed)
I called and talk with the daughter, the patient is still having problems with delusional thinking and hallucinations, calling the police frequently.  The Seroquel does help, but the patient cannot tolerate it secondary to nausea.  I will start Abilify in low-dose, we need to see the patient back, apparently no revisit was set up after her virtual visit in April.  The patient may be developing signs of parkinsonism, will need to be careful with the use of Abilify.

## 2018-12-11 DIAGNOSIS — I69398 Other sequelae of cerebral infarction: Secondary | ICD-10-CM | POA: Diagnosis not present

## 2018-12-11 DIAGNOSIS — M800AXD Age-related osteoporosis with current pathological fracture, other site, subsequent encounter for fracture with routine healing: Secondary | ICD-10-CM | POA: Diagnosis not present

## 2018-12-11 DIAGNOSIS — M25512 Pain in left shoulder: Secondary | ICD-10-CM | POA: Diagnosis not present

## 2018-12-11 DIAGNOSIS — R2681 Unsteadiness on feet: Secondary | ICD-10-CM | POA: Diagnosis not present

## 2018-12-11 DIAGNOSIS — M1990 Unspecified osteoarthritis, unspecified site: Secondary | ICD-10-CM | POA: Diagnosis not present

## 2018-12-11 DIAGNOSIS — R0789 Other chest pain: Secondary | ICD-10-CM | POA: Diagnosis not present

## 2018-12-11 MED ORDER — ARIPIPRAZOLE 5 MG PO TABS: 5.0000 mg | ORAL_TABLET | Freq: Every day | ORAL | 2 refills | Status: DC

## 2018-12-11 NOTE — Addendum Note (Signed)
Addended by: Kathrynn Ducking on: 12/11/2018 01:01 PM   Modules accepted: Orders

## 2018-12-11 NOTE — Telephone Encounter (Signed)
Insurance apparently only cover 1 Abilify tablet daily, I will convert her to the 5 mg tablet taking 1 in the evening.

## 2018-12-11 NOTE — Telephone Encounter (Signed)
[facility] reached out via fax stating insurance will only cover Abilify 2 mg 1tab per day.

## 2018-12-11 NOTE — Telephone Encounter (Signed)
I reached back out to the pt's daughter and we scheduled f/u for 12/19/2018 at 330 pm.

## 2018-12-13 ENCOUNTER — Other Ambulatory Visit: Payer: Self-pay

## 2018-12-13 ENCOUNTER — Emergency Department (HOSPITAL_COMMUNITY)
Admission: EM | Admit: 2018-12-13 | Discharge: 2018-12-14 | Disposition: A | Discharge status: Home / Self Care | Payer: Medicare Other | Attending: Emergency Medicine | Admitting: Emergency Medicine

## 2018-12-13 ENCOUNTER — Encounter (HOSPITAL_COMMUNITY): Payer: Self-pay

## 2018-12-13 ENCOUNTER — Emergency Department (HOSPITAL_COMMUNITY): Payer: Medicare Other

## 2018-12-13 DIAGNOSIS — M1990 Unspecified osteoarthritis, unspecified site: Secondary | ICD-10-CM | POA: Diagnosis not present

## 2018-12-13 DIAGNOSIS — Z7901 Long term (current) use of anticoagulants: Secondary | ICD-10-CM | POA: Diagnosis not present

## 2018-12-13 DIAGNOSIS — R2681 Unsteadiness on feet: Secondary | ICD-10-CM | POA: Diagnosis not present

## 2018-12-13 DIAGNOSIS — M25512 Pain in left shoulder: Secondary | ICD-10-CM | POA: Diagnosis not present

## 2018-12-13 DIAGNOSIS — I442 Atrioventricular block, complete: Secondary | ICD-10-CM | POA: Insufficient documentation

## 2018-12-13 DIAGNOSIS — R0789 Other chest pain: Secondary | ICD-10-CM | POA: Diagnosis not present

## 2018-12-13 DIAGNOSIS — Z79899 Other long term (current) drug therapy: Secondary | ICD-10-CM | POA: Diagnosis not present

## 2018-12-13 DIAGNOSIS — R55 Syncope and collapse: Secondary | ICD-10-CM | POA: Diagnosis not present

## 2018-12-13 DIAGNOSIS — I480 Paroxysmal atrial fibrillation: Secondary | ICD-10-CM | POA: Insufficient documentation

## 2018-12-13 DIAGNOSIS — Z950 Presence of cardiac pacemaker: Secondary | ICD-10-CM | POA: Insufficient documentation

## 2018-12-13 DIAGNOSIS — E039 Hypothyroidism, unspecified: Secondary | ICD-10-CM | POA: Diagnosis not present

## 2018-12-13 DIAGNOSIS — W19XXXA Unspecified fall, initial encounter: Secondary | ICD-10-CM | POA: Diagnosis not present

## 2018-12-13 DIAGNOSIS — M542 Cervicalgia: Secondary | ICD-10-CM | POA: Diagnosis not present

## 2018-12-13 DIAGNOSIS — M800AXD Age-related osteoporosis with current pathological fracture, other site, subsequent encounter for fracture with routine healing: Secondary | ICD-10-CM | POA: Diagnosis not present

## 2018-12-13 DIAGNOSIS — I69398 Other sequelae of cerebral infarction: Secondary | ICD-10-CM | POA: Diagnosis not present

## 2018-12-13 LAB — COMPREHENSIVE METABOLIC PANEL
ALT: 12 U/L (ref 0–44)
AST: 17 U/L (ref 15–41)
Albumin: 4 g/dL (ref 3.5–5.0)
Alkaline Phosphatase: 78 U/L (ref 38–126)
Anion gap: 11 (ref 5–15)
BUN: 31 mg/dL — ABNORMAL HIGH (ref 8–23)
CO2: 24 mmol/L (ref 22–32)
Calcium: 8.6 mg/dL — ABNORMAL LOW (ref 8.9–10.3)
Chloride: 103 mmol/L (ref 98–111)
Creatinine, Ser: 0.97 mg/dL (ref 0.44–1.00)
GFR calc Af Amer: >60 mL/min (ref >60)
GFR calc non Af Amer: 52 mL/min — ABNORMAL LOW (ref >60)
Glucose, Bld: 97 mg/dL (ref 70–99)
Potassium: 3.5 mmol/L (ref 3.5–5.1)
Sodium: 138 mmol/L (ref 135–145)
Total Bilirubin: 0.8 mg/dL (ref 0.3–1.2)
Total Protein: 6.6 g/dL (ref 6.5–8.1)

## 2018-12-13 NOTE — ED Notes (Signed)
Pt stated she has been having problems with constipation.  She also stated that she passed out 2 weeks ago and then passed out again today.  She has concerns about living in a long-term care facility.

## 2018-12-13 NOTE — ED Provider Notes (Signed)
[place] DEPT Provider Note   CSN: [id number] Arrival date & time: 12/13/18  2016     History   Chief Complaint Chief Complaint  Patient presents with   Syncopal Episode    HPI Katrina Rodriguez is a 85 y.o. female with past medical history significant for paroxysmal A. fib on Eliquis, Tikosyn, pacemaker, with history of ablation, CVA with residual left-sided weakness, right optic atrophy, poor vision, and previous syncopal episodes who presents today with syncopal episode.  Patient reports that she was at home when she stood up to go to the kitchen and started to feel dizzy.  She was not able to make it back to her chair and reports that she passed out.  She woke up on the ground and was able to get up and call 911.  Patient does have home aides at home, however she reports that the home aide can be difficult to alert as she is a heavy sleeper.  Patient denies any chest pain, difficulty breathing.       HPI  Past Medical History:  Diagnosis Date   Arthritis    Atrial fibrillation [facility])    History of ablation - Dr. 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   Orthostasis    Retinal detachment    Thrombocytopenia ([place])    TIA (transient ischemic attack)     Patient Active Problem List   Diagnosis Date Noted   TIA (transient ischemic attack) 07/01/2018   Adjustment disorder with anxious mood 05/26/2018   Hypothyroidism 06/19/2017   Left-sided weakness 06/19/2017   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   Encounter for therapeutic drug monitoring 06/09/2013   Complete heart block ([place]) 06/09/2013   Syncope 04/24/2013   H/O     PAROXYSMAL ATRIAL FIBRILLATION 02/02/2010   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   DEEP VENOUS THROMBOPHLEBITIS 09/17/2008   HYPOTENSION, ORTHOSTATIC 09/17/2008   SYNCOPE 09/17/2008   CHEST PAIN 09/17/2008   PPM-[employer] 09/17/2008    Past Surgical History:  Procedure Laterality Date   ATRIAL FIBRILLATION ABLATION     CARDIAC CATHETERIZATION     CARDIAC PACEMAKER PLACEMENT     PERMANENT PACEMAKER GENERATOR CHANGE N/A 06/12/2012   Procedure: PERMANENT PACEMAKER GENERATOR CHANGE;  Surgeon: Evans Lance, MD;  Location: [facility] CATH LAB;  Service: Cardiovascular;  Laterality: N/A;   RETINAL DETACHMENT SURGERY       OB History   No obstetric history on file.      Home Medications    Prior to Admission medications   Medication Sig Start Date End Date Taking? Authorizing Provider  acetaminophen (TYLENOL) 500 MG tablet Take 500 mg by mouth every 6 (six) hours as needed for moderate pain.   Yes [provider]  apixaban (ELIQUIS) 2.5 MG TABS tablet Take 1 tablet (2.5 mg total) by mouth 2 (two) times daily. 11/06/18  Yes [name], Peter M, MD  ARIPiprazole (ABILIFY) 5 MG tablet Take 1 tablet (5 mg total) by mouth at bedtime. 12/11/18  Yes Kathrynn Ducking, MD  dofetilide (TIKOSYN) 250 MCG capsule TAKE 1 CAPSULE (250 MCG TOTAL) BY MOUTH 2 (TWO) TIMES DAILY. Patient taking  differently: Take 250 mcg by mouth 2 (two) times daily.  11/08/18  Yes Almyra Deforest, PA  fludrocortisone (FLORINEF) 0.1 MG tablet Take 1 tablet (0.1 mg total) by mouth 2 (two) times daily. 05/21/18  Yes [name], Peter M, MD  potassium chloride SA (K-DUR,KLOR-CON) 20 MEQ tablet Take 2 tablets (40 mEq total) by mouth daily. Patient taking differently: Take 20 mEq by mouth daily.  02/06/18  Yes [name], Peter M, MD  tiZANidine (ZANAFLEX) 2 MG tablet Take 2 mg by mouth 3 (three) times daily as needed. 12/03/18  Yes [provider]  traMADol (ULTRAM) 50 MG tablet Take 50 mg by mouth at bedtime as needed.  12/08/18  Yes [provider]  rosuvastatin (CRESTOR) 10 MG tablet Take 1 tablet (10 mg total) by mouth daily. Patient not taking: Reported on 12/13/2018 04/29/18 12/13/18  [name], Peter M, MD    Family History Family History  Problem Relation Age of Onset   Heart disease Father    Stroke Father    Heart attack Father    Heart disease Brother    Heart disease Brother    Hypertension Mother    Diabetes Sister    Heart attack Sister     Social History Social History   Tobacco Use   Smoking status: Never Smoker   Smokeless tobacco: Never Used  Substance Use Topics   Alcohol use: Never    Frequency: Never   Drug use: Never     Allergies   Amiodarone, Amoxicillin, Aspirin, Codeine, Darvocet [propoxyphene n-acetaminophen], Demerol, Eliquis [apixaban], Penicillins, and Xarelto [rivaroxaban]   Review of Systems Review of Systems  Constitutional: Negative for fever.  HENT: Negative.   Respiratory: Negative for chest tightness and shortness of breath.   Cardiovascular: Negative for chest pain and leg swelling.  Gastrointestinal: Negative for abdominal distention, abdominal pain, constipation, diarrhea and vomiting.  Genitourinary: Negative for dysuria and pelvic pain.  Musculoskeletal: Positive for neck pain.  Neurological: Positive for dizziness, tremors and syncope. Negative for seizures and speech difficulty.  Psychiatric/Behavioral: Negative for confusion.     Physical Exam Updated Vital Signs BP (!) 125/106    Pulse (!) 59    Temp 98.4 F (36.9 C) (Oral)    Resp (!) 21    SpO2 99%   Physical Exam Constitutional:      General: She is not in acute distress.    Appearance: Normal appearance.  HENT:     Head: Normocephalic and atraumatic.     Mouth/Throat:     Mouth: Mucous membranes are moist.  Eyes:     Extraocular Movements: Extraocular movements intact.     Conjunctiva/sclera: Conjunctivae normal.     Pupils: Pupils are equal, round, and  reactive to light.  Neck:     Musculoskeletal: Normal range of motion. Muscular tenderness present. No neck rigidity.     Comments: TTP on right posterior neck.  Cardiovascular:     Rate and Rhythm: Normal rate. Rhythm irregular.     Pulses: Normal pulses.  Pulmonary:     Breath sounds: Normal breath sounds.  Abdominal:     General: Abdomen is flat. Bowel sounds are normal.     Palpations: Abdomen is soft.  Musculoskeletal:     Right lower leg: No edema.     Left lower leg: No edema.  Lymphadenopathy:     Cervical: No cervical adenopathy.  Skin:    General: Skin is warm.     Capillary Refill: Capillary refill takes less than 2  seconds.  Neurological:     General: No focal deficit present.     Mental Status: She is alert and oriented to person, place, and time.     Comments: 4/5 strength in RUE. 2/5 strength in LUE. 3/5 strength in RLE. 2/5 strenth in LLE.       ED Treatments / Results  Labs (all labs ordered are listed, but only abnormal results are displayed) Labs Reviewed  COMPREHENSIVE METABOLIC PANEL - Abnormal; Notable for the following components:      Result Value   BUN 31 (*)    Calcium 8.6 (*)    GFR calc non Af Amer 52 (*)    All other components within normal limits    EKG EKG Interpretation  Date/Time:  Friday December 13 2018 20:32:49 EDT Ventricular Rate:  60 PR Interval:    QRS Duration: 106 QT Interval:  491 QTC Calculation: 491 R Axis:   24 Text Interpretation:  Sinus rhythm Atrial premature complex Borderline prolonged QT interval low voltage p waves Artifact Abnormal ekg Confirmed by Carmin Muskrat [phone]) on 12/13/2018 10:44:41 PM   Radiology Ct Head Wo Contrast  Result Date: 12/13/2018 CLINICAL DATA:  Syncope. EXAM: CT HEAD WITHOUT CONTRAST CT CERVICAL SPINE WITHOUT CONTRAST TECHNIQUE: Multidetector CT imaging of the head and cervical spine was performed following the standard protocol without intravenous contrast. Multiplanar CT image  reconstructions of the cervical spine were also generated. COMPARISON:  CT head and cervical spine dated November 28, 2018. FINDINGS: CT HEAD FINDINGS Brain: No evidence of acute infarction, hemorrhage, hydrocephalus, extra-axial collection or mass lesion/mass effect. Stable atrophy and chronic microvascular ischemic changes. Vascular: Atherosclerotic vascular calcification of the carotid siphons. No hyperdense vessel. Skull: Normal. Negative for fracture or focal lesion. Sinuses/Orbits: No acute finding. Stable hyperdensity of the left globe likely related to prior intra-ocular silicone injection. Other: None. CT CERVICAL SPINE FINDINGS Alignment: Normal. Skull base and vertebrae: No acute fracture. No primary bone lesion or focal pathologic process. Soft tissues and spinal canal: No prevertebral fluid or swelling. No visible canal hematoma. Disc levels: Stable diffuse cervical spondylosis with advanced right-sided neuroforaminal stenosis at C4-C5 and C5-C6 due to uncovertebral hypertrophy. Upper chest: Negative. Other: None. IMPRESSION: 1.  No acute intracranial abnormality. 2.  No acute cervical spine fracture. Electronically Signed   By: Titus Dubin M.D.   On: 12/13/2018 22:54   Ct Cervical Spine Wo Contrast  Result Date: 12/13/2018 CLINICAL DATA:  Syncope. EXAM: CT HEAD WITHOUT CONTRAST CT CERVICAL SPINE WITHOUT CONTRAST TECHNIQUE: Multidetector CT imaging of the head and cervical spine was performed following the standard protocol without intravenous contrast. Multiplanar CT image reconstructions of the cervical spine were also generated. COMPARISON:  CT head and cervical spine dated November 28, 2018. FINDINGS: CT HEAD FINDINGS Brain: No evidence of acute infarction, hemorrhage, hydrocephalus, extra-axial collection or mass lesion/mass effect. Stable atrophy and chronic microvascular ischemic changes. Vascular: Atherosclerotic vascular calcification of the carotid siphons. No hyperdense vessel.  Skull: Normal. Negative for fracture or focal lesion. Sinuses/Orbits: No acute finding. Stable hyperdensity of the left globe likely related to prior intra-ocular silicone injection. Other: None. CT CERVICAL SPINE FINDINGS Alignment: Normal. Skull base and vertebrae: No acute fracture. No primary bone lesion or focal pathologic process. Soft tissues and spinal canal: No prevertebral fluid or swelling. No visible canal hematoma. Disc levels: Stable diffuse cervical spondylosis with advanced right-sided neuroforaminal stenosis at C4-C5 and C5-C6 due to uncovertebral hypertrophy. Upper chest: Negative. Other: None. IMPRESSION: 1.  No acute  intracranial abnormality. 2.  No acute cervical spine fracture. Electronically Signed   By: Titus Dubin M.D.   On: 12/13/2018 22:54    Procedures Procedures (including critical care time)  Medications Ordered in ED Medications - No data to display   Initial Impression / Assessment and Plan / ED Course  I have reviewed the triage vital signs and the nursing notes.  Pertinent labs & imaging results that were available during my care of the patient were reviewed by me and considered in my medical decision making (see chart for details).  Patient with repeat syncopal event with unclear cause.  Will interrogate pacemaker to see if any cardiac abnormalities may have caused this given history of A. fib.  Patient's most recent echocardiogram and carotid duplex were in April 2020.  Echo was significant for 65% ejection fraction, grade 2 diastolic dysfunction, with no obvious valve abnormalities.  Carotid duplex significant for 1 to 39% stenosis in right and left carotid.  CT is negative for any stroke, hemorrhage, acute injury. Glucose within normal limits. On EKG, patient in a fib with QTc prolongation of 491.   Vitals & cardiac monitoring have been stable while in ED.  Most likely cause of syncopal event is medication induced, orthostasis versus vasovagal.  Patient is on  Abilify which can prolong QTC increasing risk for cardiogenic causes of syncope.  Patient also has prescriptions for Zanaflex and tramadol which both will increase risk of dizziness and falling.     Final Clinical Impressions(s) / ED Diagnoses   Final diagnoses:  Syncope, unspecified syncope type   Patient is at high risk for falls given her impaired vision, generalized weakness, chronic left lower extremity weakness, age, hx of CVA with residual deficit.  She lives at home but does have home care takers who stay at the house and some who come during the week. She does not have any reason for hospitalization at this time as her CT was negative for any acute findings. Patient is adamant about going home; she does not want to go to any nursing facility.  Plans to discharge patient back to home with return precautions and instructions for safety.  Will send chart to patient's PCP.  Would recommend medication reevaluation with Abilify, Zanaflex, tramadol given patient's high fall and bleeding risk.   Patient's discharge pending pacemaker interrogation.  Patient signed out to Dr. Roxanne Mins at [id number].    ED Discharge Orders    None       Wilber Oliphant, MD 12/14/18 [id number]    Carmin Muskrat, MD 12/15/18 2025

## 2018-12-13 NOTE — ED Triage Notes (Addendum)
Patient coming from home with complaints of a syncopal episode. Patient went to stand, became dizzy, and passed out. Positive for LOC. Does not remember if she hit her head but is not complaining of any new pain from fall. Patient has a history of syncope. Patient is on eliquis for afib. Patient is A&O x4. CBG 122.

## 2018-12-14 DIAGNOSIS — Z7401 Bed confinement status: Secondary | ICD-10-CM | POA: Diagnosis not present

## 2018-12-14 DIAGNOSIS — H538 Other visual disturbances: Secondary | ICD-10-CM | POA: Diagnosis not present

## 2018-12-14 DIAGNOSIS — R262 Difficulty in walking, not elsewhere classified: Secondary | ICD-10-CM | POA: Diagnosis not present

## 2018-12-14 DIAGNOSIS — M2550 Pain in unspecified joint: Secondary | ICD-10-CM | POA: Diagnosis not present

## 2018-12-14 NOTE — Discharge Instructions (Addendum)
You were brought to the ED due to a syncopal episode. You did complain of some pain in your neck and we did head imaging to make sure there were no acute injuries and all of the imaging was negative.  You did not have any other symptoms that would have required hospitalization.  The cause for your passing out w is not 100% clear and we would like you to follow-up with your PCP for further work-up as you have been having increased syncopal events recently.  We discussed your discharge plan and you wanted to go back to the comfort of your own home.  Please practice caution when getting up and walking around.

## 2018-12-14 NOTE — ED Notes (Signed)
Pacemaker interrogated. Results to be faxed.

## 2018-12-14 NOTE — ED Notes (Signed)
Shannon from [employer] called and results to be faxed.

## 2018-12-14 NOTE — ED Provider Notes (Signed)
Patient signed out to me pending interrogation of pacemaker.  She has had having ongoing difficulty with syncope.  Pacemaker interrogation does show over 500 episodes runs of PVCs over the last 9 months, but no arrhythmia lasting longer than 8seconds.  It is unlikely that these are the cause of her syncope.  She is felt to be safe for discharge but recommended follow-up with her cardiologist.   Delora Fuel, [place] [id number] [phone]

## 2018-12-14 NOTE — ED Notes (Signed)
ptar called  Paper work complete   

## 2018-12-17 DIAGNOSIS — M800AXD Age-related osteoporosis with current pathological fracture, other site, subsequent encounter for fracture with routine healing: Secondary | ICD-10-CM | POA: Diagnosis not present

## 2018-12-17 DIAGNOSIS — R0789 Other chest pain: Secondary | ICD-10-CM | POA: Diagnosis not present

## 2018-12-17 DIAGNOSIS — M1990 Unspecified osteoarthritis, unspecified site: Secondary | ICD-10-CM | POA: Diagnosis not present

## 2018-12-17 DIAGNOSIS — I69398 Other sequelae of cerebral infarction: Secondary | ICD-10-CM | POA: Diagnosis not present

## 2018-12-17 DIAGNOSIS — R2681 Unsteadiness on feet: Secondary | ICD-10-CM | POA: Diagnosis not present

## 2018-12-17 DIAGNOSIS — M25512 Pain in left shoulder: Secondary | ICD-10-CM | POA: Diagnosis not present

## 2018-12-19 ENCOUNTER — Other Ambulatory Visit: Payer: Self-pay

## 2018-12-19 ENCOUNTER — Encounter: Payer: Self-pay | Admitting: Neurology

## 2018-12-19 ENCOUNTER — Ambulatory Visit (INDEPENDENT_AMBULATORY_CARE_PROVIDER_SITE_OTHER): Payer: Medicare Other | Admitting: Neurology

## 2018-12-19 VITALS — BP 154/64 | HR 65 | Temp 98.2°F | Ht 65.0 in | Wt 112.0 lb

## 2018-12-19 DIAGNOSIS — H547 Unspecified visual loss: Secondary | ICD-10-CM | POA: Insufficient documentation

## 2018-12-19 DIAGNOSIS — R269 Unspecified abnormalities of gait and mobility: Secondary | ICD-10-CM | POA: Diagnosis not present

## 2018-12-19 DIAGNOSIS — R443 Hallucinations, unspecified: Secondary | ICD-10-CM | POA: Diagnosis not present

## 2018-12-19 HISTORY — DX: Unspecified visual loss: H54.7

## 2018-12-19 HISTORY — DX: Hallucinations, unspecified: R44.3

## 2018-12-19 MED ORDER — ARIPIPRAZOLE 10 MG PO TABS: 10.0000 mg | ORAL_TABLET | Freq: Every day | ORAL | 3 refills | Status: DC

## 2018-12-19 NOTE — Patient Instructions (Signed)
We will go up on the abilify to 10 mg daily.

## 2018-12-19 NOTE — Progress Notes (Signed)
Reason for visit: Visual loss, memory disturbance, hallucinations, gait disturbance  Katrina Rodriguez is an 85 y.o. female  History of present illness:  Katrina Rodriguez is an 85 year old right-handed white female with a history of progressive blindness, she has very minimal useful vision at this point.  She lives at home, there is someone there with her, but she is currently managing her own medications even though she cannot see well enough to read what is on the bottle.  The patient is having a combination of visual hallucinations and delusional thinking and behavior.  She will see people in the house, she believes that someone is living with her who is not and she sees babies in the house that she cares for.  The patient has called the police on various occasions, she was placed on Seroquel but got no benefit from this.  She was switched to Abilify, currently on 5 mg at night and this has not been effective whatsoever for the hallucinations.  The patient will occasionally have some tremors, with her walking she will sometimes freeze up, she is not consistently using a cane or a walker.  She has fallen on several occasions, she fell on 28 November 2018, and she had a syncopal type event on 13 December 2018.  She went to the emergency room for these events, she did have a CT scan of the brain that did not show any acute changes.  She is on blood thinners.  She has a pacemaker in place, she cannot have MRI evaluations.  She comes to the office today for an evaluation.  Past Medical History:  Diagnosis Date  . Arthritis   . Atrial fibrillation ([facility])    History of ablation - Dr. Rayann Heman  . Chest pain    Has normal coronaries per cath in 2005; negative nuclear in 2010  . Chronic anemia   . Complete heart block ([place]) 06/09/2013  . H/O: CVA (cardiovascular accident)    Right internal capsule  . Hypothyroidism 06/19/2017  . Left-sided weakness 06/19/2017  . Mild pulmonary hypertension ([place])   . Optic atrophy of  right eye 01/18/2015  . Orthostasis   . Retinal detachment   . Thrombocytopenia ([place])   . TIA (transient ischemic attack)     Past Surgical History:  Procedure Laterality Date  . ATRIAL FIBRILLATION ABLATION    . CARDIAC CATHETERIZATION    . CARDIAC PACEMAKER PLACEMENT    . PERMANENT PACEMAKER GENERATOR CHANGE N/A 06/12/2012   Procedure: PERMANENT PACEMAKER GENERATOR CHANGE;  Surgeon: Evans Lance, MD;  Location[facility] C[facility] LAB;  Service: Cardiovascular;  Laterality: N/A;  . RETINAL DETACHMENT SURGERY      Family History  Problem Relation Age of Onset  . Heart disease Father   . Stroke Father   . Heart attack Father   . Heart disease Brother   . Heart disease Brother   . Hypertension Mother   . Diabetes Sister   . Heart attack Sister     Social history:  reports that she has never smoked. She has never used smokeless tobacco. She reports that she does not drink alcohol or use drugs.    Allergies  Allergen Reactions  . Amiodarone Other (See Comments)    Caused thyroid problems  . Amoxicillin Hives  . Aspirin Other (See Comments)    On tikosyn and warfarin  . Codeine Other (See Comments)    Causes BP to drop  . Darvocet [Propoxyphene N-Acetaminophen] Hives  . Demerol  Itching  . Eliquis [Apixaban] Nausea Only  . Penicillins Hives    Did it involve swelling of the face/tongue/throat, SOB, or low BP? Unknown Did it involve sudden or severe rash/hives, skin peeling, or any reaction on the inside of your mouth or nose? Unknown Did you need to seek medical attention at a hospital or doctor's office? Unknown When did it last happen?young adult or child?? If all above answers are "NO", may proceed with cephalosporin use.  Alveda Reasons [Rivaroxaban] Rash    Medications:  Prior to Admission medications   Medication Sig Start Date End Date Taking? Authorizing Provider  acetaminophen (TYLENOL) 500 MG tablet Take 500 mg by mouth every 6 (six) hours as needed for moderate  pain.   Yes [provider]  apixaban (ELIQUIS) 2.5 MG TABS tablet Take 1 tablet (2.5 mg total) by mouth 2 (two) times daily. 11/06/18  Yes [name], Peter M, MD  ARIPiprazole (ABILIFY) 5 MG tablet Take 1 tablet (5 mg total) by mouth at bedtime. 12/11/18  Yes Kathrynn Ducking, MD  dofetilide (TIKOSYN) 250 MCG capsule TAKE 1 CAPSULE (250 MCG TOTAL) BY MOUTH 2 (TWO) TIMES DAILY. Patient taking differently: Take 250 mcg by mouth 2 (two) times daily.  11/08/18  Yes Almyra Deforest, PA  fludrocortisone (FLORINEF) 0.1 MG tablet Take 1 tablet (0.1 mg total) by mouth 2 (two) times daily. 05/21/18  Yes [name], Peter M, MD  potassium chloride SA (K-DUR,KLOR-CON) 20 MEQ tablet Take 2 tablets (40 mEq total) by mouth daily. Patient taking differently: Take 20 mEq by mouth daily.  02/06/18  Yes [name], Peter M, MD  tiZANidine (ZANAFLEX) 2 MG tablet Take 2 mg by mouth 3 (three) times daily as needed. 12/03/18  Yes [provider]  traMADol (ULTRAM) 50 MG tablet Take 50 mg by mouth at bedtime as needed. 12/08/18  Yes [provider]  rosuvastatin (CRESTOR) 10 MG tablet Take 1 tablet (10 mg total) by mouth daily. Patient not taking: Reported on 12/13/2018 04/29/18 12/13/18  [name], Peter M, MD    ROS:  Out of a complete 14 system review of symptoms, the patient complains only of the following symptoms, and all other reviewed systems are negative.  Walking difficulty Falls Memory disturbance Hallucinations Visual loss  Blood pressure (!) 154/64, pulse 65, temperature 98.2 F (36.8 C), height 5\' 5"  (1.651 m), weight 112 lb (50.8 kg).   Blood pressure, right arm, sitting is 138/74.  Blood pressure, right arm, standing is 136/70.  Physical Exam  General: The patient is alert and cooperative at the time of the examination.  Skin: No significant peripheral edema is noted.   Neurologic Exam  Mental status: The patient is alert and oriented x 3 at the time of the examination. The Mini-Mental  status examination done today shows a total score 22/30.   Cranial nerves: Facial symmetry is present. Speech is normal, no aphasia or dysarthria is noted. Extraocular movements are full. Visual fields are restricted, the patient has significant visual impairment, she cannot consistently count fingers.  Motor: The patient has good strength in all 4 extremities.  Sensory examination: Soft touch sensation is symmetric on the face, arms, and legs, with exception of some slight decrease in soft touch sensation on the left leg as compared to the right.  Coordination: The patient has good finger-nose-finger and heel-to-shin bilaterally.  Gait and station: The patient has a slightly wide-based gait, the patient can walk with the examiner.  Romberg is negative.  Tandem gait was  not attempted.  Reflexes: Deep tendon reflexes are symmetric.   Assessment/Plan:  1.  Progressive blindness  2.  Visual hallucinations, delusional thinking  3.  Memory disorder  4.  Gait disorder, falls  The patient will be increased on the Abilify to 10 mg daily, some of the visual hallucinations may be in part related to the [facility] syndrome, but the patient is also having delusional thinking as well.  The patient likely has a mild underlying dementia.  She should not be managing her own medications secondary to her severe visual problems and memory disorder.  I will try to get physical therapy out to the house to work on gait training.  She will follow-up here in 4 months.  Jill Alexanders MD 12/19/2018 4:32 PM  [facility] [address] [place], [place] [address]  Phone [phone] Fax [phone]

## 2018-12-20 DIAGNOSIS — R2681 Unsteadiness on feet: Secondary | ICD-10-CM | POA: Diagnosis not present

## 2018-12-20 DIAGNOSIS — R0789 Other chest pain: Secondary | ICD-10-CM | POA: Diagnosis not present

## 2018-12-20 DIAGNOSIS — M1990 Unspecified osteoarthritis, unspecified site: Secondary | ICD-10-CM | POA: Diagnosis not present

## 2018-12-20 DIAGNOSIS — M25512 Pain in left shoulder: Secondary | ICD-10-CM | POA: Diagnosis not present

## 2018-12-20 DIAGNOSIS — M800AXD Age-related osteoporosis with current pathological fracture, other site, subsequent encounter for fracture with routine healing: Secondary | ICD-10-CM | POA: Diagnosis not present

## 2018-12-20 DIAGNOSIS — I69398 Other sequelae of cerebral infarction: Secondary | ICD-10-CM | POA: Diagnosis not present

## 2018-12-23 DIAGNOSIS — M25512 Pain in left shoulder: Secondary | ICD-10-CM | POA: Diagnosis not present

## 2018-12-23 DIAGNOSIS — R0789 Other chest pain: Secondary | ICD-10-CM | POA: Diagnosis not present

## 2018-12-23 DIAGNOSIS — I69398 Other sequelae of cerebral infarction: Secondary | ICD-10-CM | POA: Diagnosis not present

## 2018-12-23 DIAGNOSIS — R2681 Unsteadiness on feet: Secondary | ICD-10-CM | POA: Diagnosis not present

## 2018-12-23 DIAGNOSIS — M800AXD Age-related osteoporosis with current pathological fracture, other site, subsequent encounter for fracture with routine healing: Secondary | ICD-10-CM | POA: Diagnosis not present

## 2018-12-23 DIAGNOSIS — M1990 Unspecified osteoarthritis, unspecified site: Secondary | ICD-10-CM | POA: Diagnosis not present

## 2018-12-25 DIAGNOSIS — R2681 Unsteadiness on feet: Secondary | ICD-10-CM | POA: Diagnosis not present

## 2018-12-25 DIAGNOSIS — I69398 Other sequelae of cerebral infarction: Secondary | ICD-10-CM | POA: Diagnosis not present

## 2018-12-25 DIAGNOSIS — R0789 Other chest pain: Secondary | ICD-10-CM | POA: Diagnosis not present

## 2018-12-25 DIAGNOSIS — M25512 Pain in left shoulder: Secondary | ICD-10-CM | POA: Diagnosis not present

## 2018-12-25 DIAGNOSIS — M1990 Unspecified osteoarthritis, unspecified site: Secondary | ICD-10-CM | POA: Diagnosis not present

## 2018-12-25 DIAGNOSIS — M800AXD Age-related osteoporosis with current pathological fracture, other site, subsequent encounter for fracture with routine healing: Secondary | ICD-10-CM | POA: Diagnosis not present

## 2018-12-30 ENCOUNTER — Emergency Department (HOSPITAL_COMMUNITY): Payer: Medicare Other

## 2018-12-30 ENCOUNTER — Encounter (HOSPITAL_COMMUNITY): Payer: Self-pay | Admitting: *Deleted

## 2018-12-30 ENCOUNTER — Inpatient Hospital Stay (HOSPITAL_COMMUNITY): Payer: Medicare Other

## 2018-12-30 ENCOUNTER — Other Ambulatory Visit: Payer: Self-pay

## 2018-12-30 ENCOUNTER — Inpatient Hospital Stay (HOSPITAL_COMMUNITY)
Admission: EM | Admit: 2018-12-30 | Discharge: 2019-01-03 | DRG: 522 | Disposition: A | Discharge status: Skilled Nursing Facility | Payer: Medicare Other | Attending: Internal Medicine | Admitting: Internal Medicine

## 2018-12-30 DIAGNOSIS — Z03818 Encounter for observation for suspected exposure to other biological agents ruled out: Secondary | ICD-10-CM | POA: Diagnosis not present

## 2018-12-30 DIAGNOSIS — W010XXA Fall on same level from slipping, tripping and stumbling without subsequent striking against object, initial encounter: Secondary | ICD-10-CM | POA: Diagnosis present

## 2018-12-30 DIAGNOSIS — Z79899 Other long term (current) drug therapy: Secondary | ICD-10-CM | POA: Diagnosis not present

## 2018-12-30 DIAGNOSIS — M25521 Pain in right elbow: Secondary | ICD-10-CM | POA: Diagnosis not present

## 2018-12-30 DIAGNOSIS — M2550 Pain in unspecified joint: Secondary | ICD-10-CM | POA: Diagnosis not present

## 2018-12-30 DIAGNOSIS — Z01818 Encounter for other preprocedural examination: Secondary | ICD-10-CM | POA: Diagnosis not present

## 2018-12-30 DIAGNOSIS — M25551 Pain in right hip: Secondary | ICD-10-CM | POA: Diagnosis not present

## 2018-12-30 DIAGNOSIS — Z950 Presence of cardiac pacemaker: Secondary | ICD-10-CM

## 2018-12-30 DIAGNOSIS — W19XXXA Unspecified fall, initial encounter: Secondary | ICD-10-CM

## 2018-12-30 DIAGNOSIS — M25572 Pain in left ankle and joints of left foot: Secondary | ICD-10-CM | POA: Diagnosis not present

## 2018-12-30 DIAGNOSIS — M25552 Pain in left hip: Secondary | ICD-10-CM | POA: Diagnosis not present

## 2018-12-30 DIAGNOSIS — G3183 Dementia with Lewy bodies: Secondary | ICD-10-CM | POA: Diagnosis not present

## 2018-12-30 DIAGNOSIS — R410 Disorientation, unspecified: Secondary | ICD-10-CM | POA: Diagnosis not present

## 2018-12-30 DIAGNOSIS — R443 Hallucinations, unspecified: Secondary | ICD-10-CM | POA: Diagnosis not present

## 2018-12-30 DIAGNOSIS — Z79891 Long term (current) use of opiate analgesic: Secondary | ICD-10-CM

## 2018-12-30 DIAGNOSIS — R2681 Unsteadiness on feet: Secondary | ICD-10-CM | POA: Diagnosis not present

## 2018-12-30 DIAGNOSIS — I480 Paroxysmal atrial fibrillation: Secondary | ICD-10-CM | POA: Diagnosis present

## 2018-12-30 DIAGNOSIS — I442 Atrioventricular block, complete: Secondary | ICD-10-CM | POA: Diagnosis present

## 2018-12-30 DIAGNOSIS — Z8249 Family history of ischemic heart disease and other diseases of the circulatory system: Secondary | ICD-10-CM

## 2018-12-30 DIAGNOSIS — Z9181 History of falling: Secondary | ICD-10-CM | POA: Diagnosis not present

## 2018-12-30 DIAGNOSIS — S72001A Fracture of unspecified part of neck of right femur, initial encounter for closed fracture: Secondary | ICD-10-CM | POA: Diagnosis not present

## 2018-12-30 DIAGNOSIS — E039 Hypothyroidism, unspecified: Secondary | ICD-10-CM | POA: Diagnosis present

## 2018-12-30 DIAGNOSIS — Z419 Encounter for procedure for purposes other than remedying health state, unspecified: Secondary | ICD-10-CM

## 2018-12-30 DIAGNOSIS — F0280 Dementia in other diseases classified elsewhere without behavioral disturbance: Secondary | ICD-10-CM | POA: Diagnosis not present

## 2018-12-30 DIAGNOSIS — Z7901 Long term (current) use of anticoagulants: Secondary | ICD-10-CM | POA: Diagnosis not present

## 2018-12-30 DIAGNOSIS — I495 Sick sinus syndrome: Secondary | ICD-10-CM | POA: Diagnosis not present

## 2018-12-30 DIAGNOSIS — S72041A Displaced fracture of base of neck of right femur, initial encounter for closed fracture: Secondary | ICD-10-CM | POA: Diagnosis not present

## 2018-12-30 DIAGNOSIS — R52 Pain, unspecified: Secondary | ICD-10-CM | POA: Diagnosis not present

## 2018-12-30 DIAGNOSIS — Z833 Family history of diabetes mellitus: Secondary | ICD-10-CM

## 2018-12-30 DIAGNOSIS — Z880 Allergy status to penicillin: Secondary | ICD-10-CM | POA: Diagnosis not present

## 2018-12-30 DIAGNOSIS — Z886 Allergy status to analgesic agent status: Secondary | ICD-10-CM

## 2018-12-30 DIAGNOSIS — F0390 Unspecified dementia without behavioral disturbance: Secondary | ICD-10-CM | POA: Diagnosis not present

## 2018-12-30 DIAGNOSIS — Z66 Do not resuscitate: Secondary | ICD-10-CM | POA: Diagnosis present

## 2018-12-30 DIAGNOSIS — H547 Unspecified visual loss: Secondary | ICD-10-CM | POA: Diagnosis present

## 2018-12-30 DIAGNOSIS — T07XXXA Unspecified multiple injuries, initial encounter: Secondary | ICD-10-CM | POA: Diagnosis not present

## 2018-12-30 DIAGNOSIS — E43 Unspecified severe protein-calorie malnutrition: Secondary | ICD-10-CM | POA: Diagnosis not present

## 2018-12-30 DIAGNOSIS — R296 Repeated falls: Secondary | ICD-10-CM | POA: Diagnosis present

## 2018-12-30 DIAGNOSIS — I4891 Unspecified atrial fibrillation: Secondary | ICD-10-CM | POA: Diagnosis present

## 2018-12-30 DIAGNOSIS — Z823 Family history of stroke: Secondary | ICD-10-CM

## 2018-12-30 DIAGNOSIS — Z885 Allergy status to narcotic agent status: Secondary | ICD-10-CM

## 2018-12-30 DIAGNOSIS — I272 Pulmonary hypertension, unspecified: Secondary | ICD-10-CM | POA: Diagnosis present

## 2018-12-30 DIAGNOSIS — R41841 Cognitive communication deficit: Secondary | ICD-10-CM | POA: Diagnosis not present

## 2018-12-30 DIAGNOSIS — T148XXA Other injury of unspecified body region, initial encounter: Secondary | ICD-10-CM

## 2018-12-30 DIAGNOSIS — Z471 Aftercare following joint replacement surgery: Secondary | ICD-10-CM | POA: Diagnosis not present

## 2018-12-30 DIAGNOSIS — Z888 Allergy status to other drugs, medicaments and biological substances status: Secondary | ICD-10-CM | POA: Diagnosis not present

## 2018-12-30 DIAGNOSIS — D649 Anemia, unspecified: Secondary | ICD-10-CM | POA: Diagnosis present

## 2018-12-30 DIAGNOSIS — M1990 Unspecified osteoarthritis, unspecified site: Secondary | ICD-10-CM | POA: Diagnosis present

## 2018-12-30 DIAGNOSIS — S59901A Unspecified injury of right elbow, initial encounter: Secondary | ICD-10-CM | POA: Diagnosis not present

## 2018-12-30 DIAGNOSIS — G459 Transient cerebral ischemic attack, unspecified: Secondary | ICD-10-CM | POA: Diagnosis not present

## 2018-12-30 DIAGNOSIS — S7290XA Unspecified fracture of unspecified femur, initial encounter for closed fracture: Secondary | ICD-10-CM | POA: Diagnosis not present

## 2018-12-30 DIAGNOSIS — I517 Cardiomegaly: Secondary | ICD-10-CM | POA: Diagnosis not present

## 2018-12-30 DIAGNOSIS — D696 Thrombocytopenia, unspecified: Secondary | ICD-10-CM | POA: Diagnosis present

## 2018-12-30 DIAGNOSIS — R9431 Abnormal electrocardiogram [ECG] [EKG]: Secondary | ICD-10-CM | POA: Diagnosis not present

## 2018-12-30 DIAGNOSIS — I69354 Hemiplegia and hemiparesis following cerebral infarction affecting left non-dominant side: Secondary | ICD-10-CM

## 2018-12-30 DIAGNOSIS — R000 Tachycardia, unspecified: Secondary | ICD-10-CM | POA: Diagnosis not present

## 2018-12-30 DIAGNOSIS — S72011A Unspecified intracapsular fracture of right femur, initial encounter for closed fracture: Secondary | ICD-10-CM | POA: Diagnosis not present

## 2018-12-30 DIAGNOSIS — S72009A Fracture of unspecified part of neck of unspecified femur, initial encounter for closed fracture: Secondary | ICD-10-CM | POA: Diagnosis present

## 2018-12-30 DIAGNOSIS — S72041D Displaced fracture of base of neck of right femur, subsequent encounter for closed fracture with routine healing: Secondary | ICD-10-CM | POA: Diagnosis not present

## 2018-12-30 DIAGNOSIS — S0990XA Unspecified injury of head, initial encounter: Secondary | ICD-10-CM | POA: Diagnosis not present

## 2018-12-30 DIAGNOSIS — Z96641 Presence of right artificial hip joint: Secondary | ICD-10-CM | POA: Diagnosis not present

## 2018-12-30 DIAGNOSIS — I959 Hypotension, unspecified: Secondary | ICD-10-CM | POA: Diagnosis not present

## 2018-12-30 DIAGNOSIS — Z20828 Contact with and (suspected) exposure to other viral communicable diseases: Secondary | ICD-10-CM | POA: Diagnosis present

## 2018-12-30 DIAGNOSIS — I4581 Long QT syndrome: Secondary | ICD-10-CM | POA: Diagnosis not present

## 2018-12-30 DIAGNOSIS — Z7401 Bed confinement status: Secondary | ICD-10-CM | POA: Diagnosis not present

## 2018-12-30 DIAGNOSIS — M6281 Muscle weakness (generalized): Secondary | ICD-10-CM | POA: Diagnosis not present

## 2018-12-30 DIAGNOSIS — R1310 Dysphagia, unspecified: Secondary | ICD-10-CM | POA: Diagnosis not present

## 2018-12-30 DIAGNOSIS — R0902 Hypoxemia: Secondary | ICD-10-CM | POA: Diagnosis not present

## 2018-12-30 LAB — CBC WITH DIFFERENTIAL/PLATELET
Abs Immature Granulocytes: 0.02 10*3/uL (ref 0.00–0.07)
Basophils Absolute: 0 10*3/uL (ref 0.0–0.1)
Basophils Relative: 1 %
Eosinophils Absolute: 0.1 10*3/uL (ref 0.0–0.5)
Eosinophils Relative: 1 %
HCT: 37.4 % (ref 36.0–46.0)
Hemoglobin: 12.3 g/dL (ref 12.0–15.0)
Immature Granulocytes: 1 %
Lymphocytes Relative: 18 %
Lymphs Abs: 0.8 10*3/uL (ref 0.7–4.0)
MCH: 32.2 pg (ref 26.0–34.0)
MCHC: 32.9 g/dL (ref 30.0–36.0)
MCV: 97.9 fL (ref 80.0–100.0)
Monocytes Absolute: 0.5 10*3/uL (ref 0.1–1.0)
Monocytes Relative: 11 %
Neutro Abs: 3 10*3/uL (ref 1.7–7.7)
Neutrophils Relative %: 68 %
Platelets: 126 10*3/uL — ABNORMAL LOW (ref 150–400)
RBC: 3.82 MIL/uL — ABNORMAL LOW (ref 3.87–5.11)
RDW: 13.5 % (ref 11.5–15.5)
WBC: 4.3 10*3/uL (ref 4.0–10.5)
nRBC: 0 % (ref 0.0–0.2)

## 2018-12-30 LAB — BASIC METABOLIC PANEL
Anion gap: 10 (ref 5–15)
BUN: 33 mg/dL — ABNORMAL HIGH (ref 8–23)
CO2: 24 mmol/L (ref 22–32)
Calcium: 8.6 mg/dL — ABNORMAL LOW (ref 8.9–10.3)
Chloride: 105 mmol/L (ref 98–111)
Creatinine, Ser: 1.2 mg/dL — ABNORMAL HIGH (ref 0.44–1.00)
GFR calc Af Amer: 47 mL/min — ABNORMAL LOW (ref >60)
GFR calc non Af Amer: 40 mL/min — ABNORMAL LOW (ref >60)
Glucose, Bld: 112 mg/dL — ABNORMAL HIGH (ref 70–99)
Potassium: 3.7 mmol/L (ref 3.5–5.1)
Sodium: 139 mmol/L (ref 135–145)

## 2018-12-30 LAB — MAGNESIUM: Magnesium: 2.2 mg/dL (ref 1.7–2.4)

## 2018-12-30 LAB — SURGICAL PCR SCREEN
MRSA, PCR: NEGATIVE
Staphylococcus aureus: NEGATIVE

## 2018-12-30 LAB — CBC
HCT: 36.4 % (ref 36.0–46.0)
Hemoglobin: 11.9 g/dL — ABNORMAL LOW (ref 12.0–15.0)
MCH: 31.7 pg (ref 26.0–34.0)
MCHC: 32.7 g/dL (ref 30.0–36.0)
MCV: 97.1 fL (ref 80.0–100.0)
Platelets: 109 10*3/uL — ABNORMAL LOW (ref 150–400)
RBC: 3.75 MIL/uL — ABNORMAL LOW (ref 3.87–5.11)
RDW: 13.8 % (ref 11.5–15.5)
WBC: 8 10*3/uL (ref 4.0–10.5)
nRBC: 0 % (ref 0.0–0.2)

## 2018-12-30 LAB — SARS CORONAVIRUS 2 (TAT 6-24 HRS): SARS Coronavirus 2: NEGATIVE

## 2018-12-30 LAB — CBG MONITORING, ED: Glucose-Capillary: 111 mg/dL — ABNORMAL HIGH (ref 70–99)

## 2018-12-30 MED ORDER — MUPIROCIN 2 % EX OINT
1.0000 "application " | TOPICAL_OINTMENT | Freq: Two times a day (BID) | CUTANEOUS | Status: DC
  Administered 2018-12-30 – 2019-01-03 (×9): 1 via NASAL
  Filled 2018-12-30 (×6): qty 22

## 2018-12-30 MED ORDER — FENTANYL CITRATE (PF) 100 MCG/2ML IJ SOLN
50.0000 ug | Freq: Once | INTRAMUSCULAR | Status: DC | PRN
  Filled 2018-12-30: qty 2

## 2018-12-30 MED ORDER — POTASSIUM CHLORIDE CRYS ER 20 MEQ PO TBCR
40.0000 meq | EXTENDED_RELEASE_TABLET | Freq: Once | ORAL | Status: AC
  Administered 2018-12-30: 40 meq via ORAL
  Filled 2018-12-30: qty 2

## 2018-12-30 MED ORDER — HYDROCODONE-ACETAMINOPHEN 5-325 MG PO TABS
1.0000 | ORAL_TABLET | Freq: Four times a day (QID) | ORAL | Status: DC | PRN
  Administered 2018-12-30: 1 via ORAL
  Administered 2018-12-31: 2 via ORAL
  Administered 2018-12-31 – 2019-01-03 (×3): 1 via ORAL
  Filled 2018-12-30 (×2): qty 1
  Filled 2018-12-30: qty 2
  Filled 2018-12-30 (×2): qty 1
  Filled 2018-12-30: qty 2
  Filled 2018-12-30: qty 1

## 2018-12-30 MED ORDER — POTASSIUM CHLORIDE CRYS ER 20 MEQ PO TBCR: 40.0000 meq | EXTENDED_RELEASE_TABLET | Freq: Every day | ORAL | Status: DC

## 2018-12-30 MED ORDER — POTASSIUM CHLORIDE CRYS ER 20 MEQ PO TBCR
40.0000 meq | EXTENDED_RELEASE_TABLET | Freq: Every day | ORAL | Status: DC
  Administered 2018-12-31 – 2019-01-03 (×4): 40 meq via ORAL
  Filled 2018-12-30 (×4): qty 2

## 2018-12-30 MED ORDER — POVIDONE-IODINE 10 % EX SWAB: 2.0000 "application " | Freq: Once | CUTANEOUS | Status: DC

## 2018-12-30 MED ORDER — MORPHINE SULFATE (PF) 2 MG/ML IV SOLN
0.5000 mg | INTRAVENOUS | Status: DC | PRN
  Administered 2018-12-30: 0.5 mg via INTRAVENOUS
  Filled 2018-12-30: qty 1

## 2018-12-30 MED ORDER — ENSURE PRE-SURGERY PO LIQD
296.0000 mL | Freq: Once | ORAL | Status: DC
  Filled 2018-12-30: qty 296

## 2018-12-30 MED ORDER — DOFETILIDE 250 MCG PO CAPS
250.0000 ug | ORAL_CAPSULE | Freq: Two times a day (BID) | ORAL | Status: DC
  Administered 2018-12-30: 250 ug via ORAL
  Filled 2018-12-30 (×2): qty 1

## 2018-12-30 MED ORDER — CLINDAMYCIN PHOSPHATE 900 MG/50ML IV SOLN
900.0000 mg | INTRAVENOUS | Status: AC
  Filled 2018-12-30: qty 50

## 2018-12-30 MED ORDER — CHLORHEXIDINE GLUCONATE 4 % EX LIQD: 60.0000 mL | Freq: Once | CUTANEOUS | Status: DC

## 2018-12-30 MED ORDER — FENTANYL CITRATE (PF) 100 MCG/2ML IJ SOLN
50.0000 ug | Freq: Once | INTRAMUSCULAR | Status: AC
  Administered 2018-12-30: 50 ug via INTRAVENOUS

## 2018-12-30 NOTE — Consult Note (Signed)
Reason for Consult:Right hip fx Referring Physician: D Tat  Katrina Rodriguez is an 85 y.o. female.  HPI: Katrina Rodriguez was trying to put on her shoes and lost her balance and fell. She had immediate pain and could not get up. She was brought to the ED where x-rays showed a femoral neck fx and orthopedic surgery was consulted. She c/o localized pain to the hip. She has fallen multiple times this year.  Past Medical History:  Diagnosis Date  . Arthritis   . Atrial fibrillation (HCC)    History of ablation - Dr. Rayann Heman  . Chest pain    Has normal coronaries per cath in 2005; negative nuclear in 2010  . Chronic anemia   . Complete heart block ([place]) 06/09/2013  . H/O: CVA (cardiovascular accident)    Right internal capsule  . Hallucinations 12/19/2018  . Hypothyroidism 06/19/2017  . Left-sided weakness 06/19/2017  . Mild pulmonary hypertension ([place])   . 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 Orthostasis   . Retinal detachment   . Thrombocytopenia ([place])   . TIA (transient ischemic attack)   . Visual impairment 12/19/2018    Past Surgical History:  Procedure Laterality Date  . ATRIAL FIBRILLATION ABLATION    . CARDIAC CATHETERIZATION    . CARDIAC PACEMAKER PLACEMENT    . PERMANENT PACEMAKER GENERATOR CHANGE N/A 06/12/2012   Procedure: PERMANENT PACEMAKER GENERATOR CHANGE;  Surgeon: Evans Lance, MD;  Location: [facility] CATH LAB;  Service: Cardiovascular;  Laterality: N/A;  . RETINAL DETACHMENT SURGERY      Family History  Problem Relation Age of Onset  . Heart disease Father   . Stroke Father   . Heart attack Father   . Heart disease Brother   . Heart disease Brother   . Hypertension Mother   . Diabetes Sister   . Heart attack Sister     Social History:  reports that she has never smoked. She has never used smokeless tobacco. She reports that she does not drink alcohol or use drugs.  Allergies:  Allergies  Allergen Reactions  . Amiodarone Other (See Comments)    Caused thyroid problems   . Amoxicillin Hives  . Aspirin Other (See Comments)    On tikosyn and warfarin  . Codeine Other (See Comments)    Causes BP to drop  . Darvocet [Propoxyphene N-Acetaminophen] Hives  . Demerol Itching  . Eliquis [Apixaban] Nausea Only  . Penicillins Hives    Did it involve swelling of the face/tongue/throat, SOB, or low BP? Unknown Did it involve sudden or severe rash/hives, skin peeling, or any reaction on the inside of your mouth or nose? Unknown Did you need to seek medical attention at a hospital or doctor's office? Unknown When did it last happen?young adult or child?? If all above answers are "NO", may proceed with cephalosporin use.  Alveda Reasons [Rivaroxaban] Rash    Medications: I have reviewed the patient's current medications.  Results for orders placed or performed during the hospital encounter of 12/30/18 (from the past 48 hour(s))  SARS CORONAVIRUS 2 (TAT 6-24 HRS) Nasopharyngeal Nasopharyngeal Swab     Status: None   Collection Time: 12/30/18  2:05 AM   Specimen: Nasopharyngeal Swab  Result Value Ref Range   SARS Coronavirus 2 NEGATIVE NEGATIVE    Comment: (NOTE) SARS-CoV-2 target nucleic acids are NOT DETECTED. The SARS-CoV-2 RNA is generally detectable in upper and lower respiratory specimens during the acute phase of infection. Negative results do not preclude SARS-CoV-2  infection, do not rule out co-infections with other pathogens, and should not be used as the sole basis for treatment or other patient management decisions. Negative results must be combined with clinical observations, patient history, and epidemiological information. The expected result is Negative. Fact Sheet for Patients: SugarRoll.be Fact Sheet for Healthcare Providers: https://www.woods-mathews.com/ This test is not yet approved or cleared by the [place] FDA and  has been authorized for detection and/or diagnosis of SARS-CoV-2 by FDA  under an Emergency Use Authorization (EUA). This EUA will remain  in effect (meaning this test can be used) for the duration of the COVID-19 declaration under Section 56 4(b)(1) of the Act, 21 U.S.C. section 360bbb-3(b)(1), unless the authorization is terminated or revoked sooner. Performed at [facility], [place] [address], [place], [place] [address]   CBC with Differential     Status: Abnormal   Collection Time: 12/30/18  2:20 AM  Result Value Ref Range   WBC 4.3 4.0 - 10.5 K/uL   RBC 3.82 (L) 3.87 - 5.11 MIL/uL   Hemoglobin 12.3 12.0 - 15.0 g/dL   HCT 37.4 36.0 - 46.0 %   MCV 97.9 80.0 - 100.0 fL   MCH 32.2 26.0 - 34.0 pg   MCHC 32.9 30.0 - 36.0 g/dL   RDW 13.5 11.5 - 15.5 %   Platelets 126 (L) 150 - 400 K/uL   nRBC 0.0 0.0 - 0.2 %   Neutrophils Relative % 68 %   Neutro Abs 3.0 1.7 - 7.7 K/uL   Lymphocytes Relative 18 %   Lymphs Abs 0.8 0.7 - 4.0 K/uL   Monocytes Relative 11 %   Monocytes Absolute 0.5 0.1 - 1.0 K/uL   Eosinophils Relative 1 %   Eosinophils Absolute 0.1 0.0 - 0.5 K/uL   Basophils Relative 1 %   Basophils Absolute 0.0 0.0 - 0.1 K/uL   Immature Granulocytes 1 %   Abs Immature Granulocytes 0.02 0.00 - 0.07 K/uL    Comment: Performed at [facility] [address] [place], [place] [id number]  Basic metabolic panel     Status: Abnormal   Collection Time: 12/30/18  2:20 AM  Result Value Ref Range   Sodium 139 135 - 145 mmol/L   Potassium 3.7 3.5 - 5.1 mmol/L   Chloride 105 98 - 111 mmol/L   CO2 24 22 - 32 mmol/L   Glucose, Bld 112 (H) 70 - 99 mg/dL   BUN 33 (H) 8 - 23 mg/dL   Creatinine, Ser 1.20 (H) 0.44 - 1.00 mg/dL   Calcium 8.6 (L) 8.9 - 10.3 mg/dL   GFR calc non Af Amer 40 (L) >60 mL/min   GFR calc Af Amer 47 (L) >60 mL/min   Anion gap 10 5 - 15    Comment: Performed at [facility] [address], [place], [place] [address]  CBG monitoring, ED     Status: Abnormal   Collection Time: 12/30/18  5:25 AM  Result Value Ref Range    Glucose-Capillary 111 (H) 70 - 99 mg/dL  CBC     Status: Abnormal   Collection Time: 12/30/18  8:40 AM  Result Value Ref Range   WBC 8.0 4.0 - 10.5 K/uL   RBC 3.75 (L) 3.87 - 5.11 MIL/uL   Hemoglobin 11.9 (L) 12.0 - 15.0 g/dL   HCT 36.4 36.0 - 46.0 %   MCV 97.1 80.0 - 100.0 fL   MCH 31.7 26.0 - 34.0 pg   MCHC  32.7 30.0 - 36.0 g/dL   RDW 13.8 11.5 - 15.5 %   Platelets 109 (L) 150 - 400 K/uL    Comment: REPEATED TO VERIFY Immature Platelet Fraction may be clinically indicated, consider ordering this additional test [id number]    nRBC 0.0 0.0 - 0.2 %    Comment: Performed at [facility] [address] [place], [place] [address]    Dg Chest 1 View  Result Date: 12/30/2018 CLINICAL DATA:  85 year old female with right hip fracture. EXAM: CHEST  1 VIEW COMPARISON:  Chest radiograph dated 12/03/2018 FINDINGS: Evaluation is limited due to patient's positioning. Mild eventration of the right hemidiaphragm. No focal consolidation, pleural effusion, or pneumothorax. There is mild cardiomegaly. Left pectoral pacemaker device. Atherosclerotic calcification of the aorta. Osteopenia with degenerative changes of the spine. IMPRESSION: 1. No acute cardiopulmonary process. 2. Mild cardiomegaly. Electronically Signed   By: Anner Crete M.D.   On: 12/30/2018 02:48   Dg Elbow Complete Right  Result Date: 12/30/2018 CLINICAL DATA:  85 year old female with fall and right elbow pain. EXAM: RIGHT ELBOW - COMPLETE 3+ VIEW COMPARISON:  None. FINDINGS: No acute fracture or dislocation. The bones are osteopenic. No significant arthritic changes. No joint effusion. The soft tissues are unremarkable. IMPRESSION: No acute fracture or dislocation. Electronically Signed   By: Anner Crete M.D.   On: 12/30/2018 02:50   Ct Head Wo Contrast  Result Date: 12/30/2018 CLINICAL DATA:  Recent fall EXAM: CT HEAD WITHOUT CONTRAST TECHNIQUE: Contiguous axial images were obtained from the base of the skull  through the vertex without intravenous contrast. COMPARISON:  12/13/2018 FINDINGS: Brain: Chronic atrophic and ischemic changes are noted. No findings to suggest acute hemorrhage, acute infarction or space-occupying mass lesion are noted. Vascular: No hyperdense vessel or unexpected calcification. Skull: Normal. Negative for fracture or focal lesion. Sinuses/Orbits: No acute finding. Other: None. IMPRESSION: Chronic atrophic and ischemic changes without acute abnormality. Electronically Signed   By: [name] M.D.   On: 12/30/2018 03:07   Dg Hip Unilat W Or Wo Pelvis 2-3 Views Right  Result Date: 12/30/2018 CLINICAL DATA:  85 year old female with fall and right hip pain. EXAM: DG HIP (WITH OR WITHOUT PELVIS) 2-3V RIGHT COMPARISON:  Pelvic radiograph dated 06/01/2018 FINDINGS: There is a displaced fracture of the right femoral neck with proximal migration of the femoral shaft and impaction. No dislocation. The bones are osteopenic. The soft tissues are unremarkable. IMPRESSION: Displaced and impacted fracture of the right femoral neck. Electronically Signed   By: Anner Crete M.D.   On: 12/30/2018 02:47    Review of Systems  Constitutional: Negative for weight loss.  HENT: Negative for ear discharge, ear pain, hearing loss and tinnitus.   Eyes: Negative for blurred vision, double vision, photophobia and pain.  Respiratory: Negative for cough, sputum production and shortness of breath.   Cardiovascular: Negative for chest pain.  Gastrointestinal: Negative for abdominal pain, nausea and vomiting.  Genitourinary: Negative for dysuria, flank pain, frequency and urgency.  Musculoskeletal: Positive for joint pain (Right hip). Negative for back pain, falls, myalgias and neck pain.  Neurological: Negative for dizziness, tingling, sensory change, focal weakness, loss of consciousness and headaches.  Endo/Heme/Allergies: Does not bruise/bleed easily.  Psychiatric/Behavioral: Negative for depression,  memory loss and substance abuse. The patient is not nervous/anxious.    Blood pressure (!) 115/50, pulse 68, resp. rate 14, weight 50.8 kg, SpO2 93 %. Physical Exam  Constitutional: She appears well-developed and well-nourished. No distress.  HENT:  Head: Normocephalic and atraumatic.  Eyes: Conjunctivae are normal. Right eye exhibits no discharge. Left eye exhibits no discharge. No scleral icterus.  Neck: Normal range of motion.  Cardiovascular: Normal rate and regular rhythm.  Respiratory: Effort normal. No respiratory distress.  Musculoskeletal:     Comments: LLE No traumatic wounds, ecchymosis, or rash  Mild TTP hip, mod TTP knee  No knee or ankle effusion  Knee stable to varus/ valgus and anterior/posterior stress  Sens DPN, SPN, TN intact  Motor EHL, ext, flex, evers 5/5  DP 2+, PT 0, No significant edema  Neurological: She is alert.  Skin: Skin is warm and dry. She is not diaphoretic.  Psychiatric: She has a normal mood and affect. Her behavior is normal.    Assessment/Plan: Right hip fx -- plan hip hemi with Dr. Lyla Glassing on Wednesday afternoon to give time for Eliquis to wear off and for cardiology to address her prolonged Qtc. Will await clearance but will go ahead and schedule. Multiple medical problems including paroxysmal atrial fibrillation on Eliquis, complete heart block status post PPM, CVA with residual left-sided weakness, poor vision, hypothyroidism, TIA, thrombocytopenia -- per primary service    Lisette Abu, PA-C Orthopedic Surgery [phone] 12/30/2018, 9:38 AM

## 2018-12-30 NOTE — Progress Notes (Signed)
Dr Marlowe Sax was sent a secure epic message regarding diet order

## 2018-12-30 NOTE — ED Notes (Signed)
Ems started an iv and was given fentanyl 50 mcg on the way here

## 2018-12-30 NOTE — ED Provider Notes (Signed)
[facility] EMERGENCY DEPARTMENT Provider Note   CSN: [id number] Arrival date & time: 12/30/18  0149     History   Chief Complaint Chief Complaint  Patient presents with   Fall    HPI Katrina Rodriguez is a 85 y.o. female.    85 year old female with history of PAF on Eliquis, Tikosyn, pacemaker, with history of ablation, CVA with residual left-sided weakness, poor vision, and previous syncopal episodes presents to the emergency department following a fall at home.  She lives in a house where she has someone assist her during the day.  States that she got up because she wanted to have an early breakfast.  She was getting off of her chair lift when she stumbled and fell on her right side.  Denies any chest pain or lightheadedness prior to the fall.  C/o right hip pain which is worse with movement.  Received 48mcg Fentanyl PTA with EMS.  Notes some tingling in her right foot, but no associated numbness.  The history is provided by the patient. No language interpreter was used.  Fall    Past Medical History:  Diagnosis Date   Arthritis    Atrial fibrillation [facility])    History of ablation - Dr. 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   Orthostasis    Retinal detachment    Thrombocytopenia ([place])    TIA (transient ischemic attack)    Visual impairment 12/19/2018    Patient Active Problem List   Diagnosis Date Noted   Hip fracture ([place]) 12/30/2018   Visual impairment 12/19/2018   Hallucinations 12/19/2018   TIA (transient ischemic attack) 07/01/2018   Adjustment disorder with anxious mood 05/26/2018    Hypothyroidism 06/19/2017   Left-sided weakness 06/19/2017   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   Encounter for therapeutic drug monitoring 06/09/2013   Complete heart block ([place]) 06/09/2013   Syncope 04/24/2013   H/O    PAROXYSMAL ATRIAL FIBRILLATION 02/02/2010   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   DEEP VENOUS THROMBOPHLEBITIS 09/17/2008   HYPOTENSION, ORTHOSTATIC 09/17/2008   SYNCOPE 09/17/2008   CHEST PAIN 09/17/2008   PPM-[employer]tr[employer] 09/17/2008    Past Surgical History:  Procedure Laterality Date   ATRIAL FIBRILLATION ABLATION     CARDIAC CATHETERIZATION     CARDIAC PACEMAKER PLACEMENT     PERMANENT PACEMAKER GENERATOR CHANGE N/A 06/12/2012   Procedure: PERMANENT PACEMAKER GENERATOR CHANGE;  Surgeon: Evans Lance, MD;  Location: [facility] CATH LAB;  Service: Cardiovascular;  Laterality: N/A;   RETINAL DETACHMENT SURGERY       OB History   No obstetric history on file.      Home Medications    Prior to Admission medications   Medication Sig Start Date End Date Taking? Authorizing Provider  acetaminophen (TYLENOL) 500 MG tablet Take 500 mg by mouth every 6 (six) hours as needed for moderate pain.   Yes [provider]  apixaban (ELIQUIS) 2.5 MG TABS tablet Take 1 tablet (2.5 mg total) by mouth 2 (two) times daily. 11/06/18  Yes [name], Peter M, MD  dofetilide (TIKOSYN) 250 MCG capsule TAKE 1 CAPSULE (250 MCG  TOTAL) BY MOUTH 2 (TWO) TIMES DAILY. Patient taking differently: Take 250 mcg by mouth 2 (two) times daily.  11/08/18  Yes Almyra Deforest, PA  potassium chloride SA (K-DUR,KLOR-CON) 20 MEQ tablet Take 2 tablets (40 mEq total) by mouth daily. Patient taking differently: Take 20 mEq by mouth daily as needed (pt pref).  02/06/18  Yes [name], Peter M, MD  QUEtiapine (SEROQUEL) 25 MG tablet Take 25 mg by mouth at bedtime. 12/21/18  Yes [provider]  traMADol (ULTRAM) 50 MG tablet Take 50 mg by mouth at bedtime as needed for moderate pain.   12/08/18  Yes [provider]  ARIPiprazole (ABILIFY) 10 MG tablet Take 1 tablet (10 mg total) by mouth daily. Patient not taking: Reported on 12/30/2018 12/19/18   Kathrynn Ducking, MD  fludrocortisone (FLORINEF) 0.1 MG tablet Take 1 tablet (0.1 mg total) by mouth 2 (two) times daily. 05/21/18   [name], Peter M, MD  rosuvastatin (CRESTOR) 10 MG tablet Take 1 tablet (10 mg total) by mouth daily. Patient not taking: Reported on 12/13/2018 04/29/18 12/13/18  [name], Peter M, MD    Family History Family History  Problem Relation Age of Onset   Heart disease Father    Stroke Father    Heart attack Father    Heart disease Brother    Heart disease Brother    Hypertension Mother    Diabetes Sister    Heart attack Sister     Social History Social History   Tobacco Use   Smoking status: Never Smoker   Smokeless tobacco: Never Used  Substance Use Topics   Alcohol use: Never    Frequency: Never   Drug use: Never     Allergies   Amiodarone, Amoxicillin, Aspirin, Codeine, Darvocet [propoxyphene n-acetaminophen], Demerol, Eliquis [apixaban], Penicillins, and Xarelto [rivaroxaban]   Review of Systems Review of Systems Ten systems reviewed and are negative for acute change, except as noted in the HPI.    Physical Exam Updated Vital Signs BP 123/62    Pulse 62    Resp 14    Wt 50.8 kg    SpO2 97%    BMI 18.64 kg/m   Physical Exam Vitals signs and nursing note reviewed.  Constitutional:      General: She is not in acute distress.    Appearance: She is well-developed. She is not diaphoretic.     Comments: Nontoxic appearing and in NAD. Thin, frail appearing.  HENT:     Head: Normocephalic and atraumatic.     Comments: No battle's sign or raccoon's eyes. No hematoma or contusion to scalp. Eyes:     General: No scleral icterus.    Conjunctiva/sclera: Conjunctivae normal.  Neck:     Musculoskeletal: Normal range of motion.  Cardiovascular:     Rate and  Rhythm: Normal rate and regular rhythm.     Pulses: Normal pulses.     Comments: DP pulse 2+ in the RLE Pacemaker to left upper chest. Pulmonary:     Effort: Pulmonary effort is normal. No respiratory distress.     Comments: Respirations even and unlabored Musculoskeletal:        General: Tenderness and deformity present.     Comments: Shortening of the RLE with external rotation. TTP to the R hip without crepitus.  Skin:    General: Skin is warm and dry.     Coloration: Skin is not pale.     Findings: No erythema or rash.  Neurological:     General:  No focal deficit present.     Mental Status: She is alert and oriented to person, place, and time.     Coordination: Coordination normal.     Comments: Sensation to light touch intact in the RLE. Patient able to wiggle all toes of R foot.  Psychiatric:        Behavior: Behavior normal.      ED Treatments / Results  Labs (all labs ordered are listed, but only abnormal results are displayed) Labs Reviewed  CBC WITH DIFFERENTIAL/PLATELET - Abnormal; Notable for the following components:      Result Value   RBC 3.82 (*)    Platelets 126 (*)    All other components within normal limits  BASIC METABOLIC PANEL - Abnormal; Notable for the following components:   Glucose, Bld 112 (*)    BUN 33 (*)    Creatinine, Ser 1.20 (*)    Calcium 8.6 (*)    GFR calc non Af Amer 40 (*)    GFR calc Af Amer 47 (*)    All other components within normal limits  SARS CORONAVIRUS 2 (TAT 6-24 HRS)    EKG EKG Interpretation  Date/Time:  Monday December 30 2018 02:00:56 EDT Ventricular Rate:  67 PR Interval:    QRS Duration: 95 QT Interval:  484 QTC Calculation: 511 R Axis:   1 Text Interpretation:  Sinus rhythm Borderline T abnormalities, anterior leads Prolonged QT interval Interpretation limited secondary to artifact Confirmed by Ripley Fraise [phone]) on 12/30/2018 2:03:44 AM   Radiology Dg Chest 1 View  Result Date:  12/30/2018 CLINICAL DATA:  85 year old female with right hip fracture. EXAM: CHEST  1 VIEW COMPARISON:  Chest radiograph dated 12/03/2018 FINDINGS: Evaluation is limited due to patient's positioning. Mild eventration of the right hemidiaphragm. No focal consolidation, pleural effusion, or pneumothorax. There is mild cardiomegaly. Left pectoral pacemaker device. Atherosclerotic calcification of the aorta. Osteopenia with degenerative changes of the spine. IMPRESSION: 1. No acute cardiopulmonary process. 2. Mild cardiomegaly. Electronically Signed   By: Anner Crete M.D.   On: 12/30/2018 02:48   Dg Elbow Complete Right  Result Date: 12/30/2018 CLINICAL DATA:  85 year old female with fall and right elbow pain. EXAM: RIGHT ELBOW - COMPLETE 3+ VIEW COMPARISON:  None. FINDINGS: No acute fracture or dislocation. The bones are osteopenic. No significant arthritic changes. No joint effusion. The soft tissues are unremarkable. IMPRESSION: No acute fracture or dislocation. Electronically Signed   By: Anner Crete M.D.   On: 12/30/2018 02:50   Ct Head Wo Contrast  Result Date: 12/30/2018 CLINICAL DATA:  Recent fall EXAM: CT HEAD WITHOUT CONTRAST TECHNIQUE: Contiguous axial images were obtained from the base of the skull through the vertex without intravenous contrast. COMPARISON:  12/13/2018 FINDINGS: Brain: Chronic atrophic and ischemic changes are noted. No findings to suggest acute hemorrhage, acute infarction or space-occupying mass lesion are noted. Vascular: No hyperdense vessel or unexpected calcification. Skull: Normal. Negative for fracture or focal lesion. Sinuses/Orbits: No acute finding. Other: None. IMPRESSION: Chronic atrophic and ischemic changes without acute abnormality. Electronically Signed   By: [name] M.D.   On: 12/30/2018 03:07   Dg Hip Unilat W Or Wo Pelvis 2-3 Views Right  Result Date: 12/30/2018 CLINICAL DATA:  85 year old female with fall and right hip pain. EXAM: DG HIP  (WITH OR WITHOUT PELVIS) 2-3V RIGHT COMPARISON:  Pelvic radiograph dated 06/01/2018 FINDINGS: There is a displaced fracture of the right femoral neck with proximal migration of the femoral shaft and impaction.  No dislocation. The bones are osteopenic. The soft tissues are unremarkable. IMPRESSION: Displaced and impacted fracture of the right femoral neck. Electronically Signed   By: Anner Crete M.D.   On: 12/30/2018 02:47    Procedures Procedures (including critical care time)  Medications Ordered in ED Medications  fentaNYL (SUBLIMAZE) injection 50 mcg (has no administration in time range)       Initial Impression / Assessment and Plan / ED Course  I have reviewed the triage vital signs and the nursing notes.  Pertinent labs & imaging results that were available during my care of the patient were reviewed by me and considered in my medical decision making (see chart for details).        2:00 AM 85 y/o female presenting from home s/p mechanical fall. Noted to have RLE shortening and external rotation. Neurovascularly intact. Will obtain Xrays for further evaluation of suspected hip fx.  3:08 AM Xrays reviewed by me; c/w fracture. Spoke with Dr. Doran Durand about patient case. [facility] to see patient in the AM. Plan for admission to [facility]. Patient updated and agreeable to plan.  3:14 AM Attempted to call patient's daughter, [name], with no answer.  3:46 AM Case discussed with Dr. Marlowe Sax who will admit.   Final Clinical Impressions(s) / ED Diagnoses   Final diagnoses:  Closed fracture of neck of right femur, initial encounter [facility])  Fall, initial encounter    ED Discharge Orders    None       Antonietta Breach, PA-C 12/30/18 0346    Ripley Fraise, MD 12/30/18 [phone]

## 2018-12-30 NOTE — Consult Note (Signed)
Cardiology Consultation:   Patient ID: Katrina Rodriguez MRN: [id number]; DOB: 06-17-1928  Admit date: 12/30/2018 Date of Consult: 12/30/2018  Primary Care Provider: Leighton Ruff, MD Primary Cardiologist: Peter [name], MD  Primary Electrophysiologist:  Thompson Grayer, MD    Patient Profile:   Katrina Rodriguez is a 85 y.o. female with a hx of paroxysmal atrial fibrillation s/p ablation, on Tikosyn and Eliquis, recurrent syncope, complete heart block s/p pacemaker, CVA with residual left-sided weakness, hypothyroidism, chronic chest pain with normal coronaries, visual impairment and orthostatic hypotension who is being seen today for the evaluation of preoperative clearance and prolonged QT at the request of Dr. Carles Collet.  History of chronic hypertension with recurrent syncope.  She was on midodrine which seems discontinued due to nausea. On Florinef.   Last echocardiogram April 2020 showed hyperdynamic LV function greater than 65%, pseudonormal diastolic dysfunction, mild to moderate aortic regurgitation.  She was last seen in ER 12/13/2018 with unspecified syncope episode.  Patient felt high risk for falls given impaired vision, residual weakness from stroke and generalized weakness.  History of Present Illness:   Katrina Rodriguez presented after mechanical fall yesterday.  Work-up revealed right humeral neck fracture.  Seen by Ortho and tentative plan for surgery on Wednesday after Eliquis washout.  Cardiology is asked for surgical clearance and prolonged QT.  Baseline, she lives her by self it will help.  Currently somewhat lethargic due to pain medication answer very sort questions.  Limiting to obtain detailed history.  Serum creatinine 1.2.  Hemoglobin 11.9. COVID negative.  CT of head without acute abnormality.  Heart Pathway Score:     Past Medical History:  Diagnosis Date  . Arthritis   . Atrial fibrillation (HCC)    History of ablation - Dr. Rayann Heman  . Chest pain    Has normal coronaries per  cath in 2005; negative nuclear in 2010  . Chronic anemia   . Complete heart block ([place]) 06/09/2013  . H/O: CVA (cardiovascular accident)    Right internal capsule  . Hallucinations 12/19/2018  . Hypothyroidism 06/19/2017  . Left-sided weakness 06/19/2017  . Mild pulmonary hypertension ([place])   . 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 Orthostasis   . Retinal detachment   . Thrombocytopenia ([place])   . TIA (transient ischemic attack)   . Visual impairment 12/19/2018    Past Surgical History:  Procedure Laterality Date  . ATRIAL FIBRILLATION ABLATION    . CARDIAC CATHETERIZATION    . CARDIAC PACEMAKER PLACEMENT    . PERMANENT PACEMAKER GENERATOR CHANGE N/A 06/12/2012   Procedure: PERMANENT PACEMAKER GENERATOR CHANGE;  Surgeon: Evans Lance, MD;  Location: [facility] CATH LAB;  Service: Cardiovascular;  Laterality: N/A;  . RETINAL DETACHMENT SURGERY      Inpatient Medications: Scheduled Meds:  Continuous Infusions:  PRN Meds: HYDROcodone-acetaminophen, morphine injection  Allergies:    Allergies  Allergen Reactions  . Amiodarone Other (See Comments)    Caused thyroid problems  . Amoxicillin Hives  . Aspirin Other (See Comments)    On tikosyn and warfarin  . Codeine Other (See Comments)    Causes BP to drop  . Darvocet [Propoxyphene N-Acetaminophen] Hives  . Demerol Itching  . Eliquis [Apixaban] Nausea Only  . Penicillins Hives    Did it involve swelling of the face/tongue/throat, SOB, or low BP? Unknown Did it involve sudden or severe rash/hives, skin peeling, or any reaction on the inside of your mouth or nose? Unknown Did you need to seek medical attention  at a hospital or doctor's office? Unknown When did it last happen?young adult or child?? If all above answers are "NO", may proceed with cephalosporin use.  Alveda Reasons [Rivaroxaban] Rash    Social History:   Social History   Socioeconomic History  . Marital status: Widowed    Spouse name: Not on file  . Number of  children: 2  . Years of education: 10  . Highest education level: Not on file  Occupational History  . Occupation: Scientist, research (physical sciences): RETIRED  Social Needs  . Financial resource strain: Not on file  . Food insecurity    Worry: Not on file    Inability: Not on file  . Transportation needs    Medical: Not on file    Non-medical: Not on file  Tobacco Use  . Smoking status: Never Smoker  . Smokeless tobacco: Never Used  Substance and Sexual Activity  . Alcohol use: Never    Frequency: Never  . Drug use: Never  . Sexual activity: Not Currently  Lifestyle  . Physical activity    Days per week: Not on file    Minutes per session: Not on file  . Stress: Not on file  Relationships  . Social Herbalist on phone: Not on file    Gets together: Not on file    Attends religious service: Not on file    Active member of club or organization: Not on file    Attends meetings of clubs or organizations: Not on file    Relationship status: Not on file  . Intimate partner violence    Fear of current or ex partner: Not on file    Emotionally abused: Not on file    Physically abused: Not on file    Forced sexual activity: Not on file  Other Topics Concern  . Not on file  Social History Narrative   Lives at home alone.   Caffeine use: Drinks coffee daily   Right handed     Family History:   Family History  Problem Relation Age of Onset  . Heart disease Father   . Stroke Father   . Heart attack Father   . Heart disease Brother   . Heart disease Brother   . Hypertension Mother   . Diabetes Sister   . Heart attack Sister      ROS:  Please see the history of present illness.  All other ROS reviewed and negative.     Physical Exam/Data:   Vitals:   12/30/18 0730 12/30/18 0900 12/30/18 0930 12/30/18 0938  BP: (!) 115/50 (!) 119/50 (!) 133/55   Pulse:    (!) 59  Resp: 14 (!) 21 14 19   SpO2:    94%  Weight:       No intake or output data in the 24 hours ending  12/30/18 1042 Last 3 Weights 12/30/2018 12/19/2018 07/01/2018  Weight (lbs) 111 lb 15.9 oz 112 lb 111 lb  Weight (kg) 50.8 kg 50.803 kg 50.349 kg     Body mass index is 18.64 kg/m.  General: Elderly frail female in no acute distress  HEENT: normal Lymph: no adenopathy Neck: no JVD Endocrine:  No thryomegaly Vascular: No carotid bruits; FA pulses 2+ bilaterally without bruits  Cardiac:  normal S1, S2; RRR; no murmur  Lungs:  clear to auscultation bilaterally, no wheezing, rhonchi or rales  Abd: soft, nontender, no hepatomegaly  Ext: no edema Musculoskeletal:  No deformitiesl Skin: warm and  dry  Neuro:   no focal abnormalities noted Psych: Somewhat lethargic but answer question appropriately  EKG:  The EKG was personally reviewed and demonstrates: Sinus rhythm at rate of 67 bpm, nonspecific T wave inversion in anterior leads, QTC 511 ms   Relevant CV Studies:   Echo with bubble study 07/02/2018  1. The left ventricle has hyperdynamic systolic function, with an ejection fraction of >65%. The cavity size was normal. Left ventricular diastolic Doppler parameters are consistent with pseudonormal.  2. The right ventricle has normal systolc function. The cavity was normal. There is no increase in right ventricular wall thickness.  3. The aortic valve is grossly normal Aortic valve regurgitation is mild to moderate by color flow Doppler. No stenosis of the aortic valve.  4. The aortic root and ascending aorta are normal in size and structure.  5. The interatrial septum was not assessed.  Laboratory Data:   Chemistry Recent Labs  Lab 12/30/18 0220  NA 139  K 3.7  CL 105  CO2 24  GLUCOSE 112*  BUN 33*  CREATININE 1.20*  CALCIUM 8.6*  GFRNONAA 40*  GFRAA 47*  ANIONGAP 10    Hematology Recent Labs  Lab 12/30/18 0220 12/30/18 0840  WBC 4.3 8.0  RBC 3.82* 3.75*  HGB 12.3 11.9*  HCT 37.4 36.4  MCV 97.9 97.1  MCH 32.2 31.7  MCHC 32.9 32.7  RDW 13.5 13.8  PLT 126* 109*     Radiology/Studies:  Dg Chest 1 View  Result Date: 12/30/2018 CLINICAL DATA:  85 year old female with right hip fracture. EXAM: CHEST  1 VIEW COMPARISON:  Chest radiograph dated 12/03/2018 FINDINGS: Evaluation is limited due to patient's positioning. Mild eventration of the right hemidiaphragm. No focal consolidation, pleural effusion, or pneumothorax. There is mild cardiomegaly. Left pectoral pacemaker device. Atherosclerotic calcification of the aorta. Osteopenia with degenerative changes of the spine. IMPRESSION: 1. No acute cardiopulmonary process. 2. Mild cardiomegaly. Electronically Signed   By: Anner Crete M.D.   On: 12/30/2018 02:48   Dg Elbow Complete Right  Result Date: 12/30/2018 CLINICAL DATA:  85 year old female with fall and right elbow pain. EXAM: RIGHT ELBOW - COMPLETE 3+ VIEW COMPARISON:  None. FINDINGS: No acute fracture or dislocation. The bones are osteopenic. No significant arthritic changes. No joint effusion. The soft tissues are unremarkable. IMPRESSION: No acute fracture or dislocation. Electronically Signed   By: Anner Crete M.D.   On: 12/30/2018 02:50   Dg Knee 1-2 Views Right  Result Date: 12/30/2018 CLINICAL DATA:  Right hip fracture. EXAM: RIGHT KNEE - 1-2 VIEW COMPARISON:  None. FINDINGS: No evidence of fracture, dislocation, or joint effusion. No evidence of arthropathy or other focal bone abnormality. Soft tissues are unremarkable. IMPRESSION: Negative. Electronically Signed   By: Marijo Conception M.D.   On: 12/30/2018 10:29   Ct Head Wo Contrast  Result Date: 12/30/2018 CLINICAL DATA:  Recent fall EXAM: CT HEAD WITHOUT CONTRAST TECHNIQUE: Contiguous axial images were obtained from the base of the skull through the vertex without intravenous contrast. COMPARISON:  12/13/2018 FINDINGS: Brain: Chronic atrophic and ischemic changes are noted. No findings to suggest acute hemorrhage, acute infarction or space-occupying mass lesion are noted. Vascular: No  hyperdense vessel or unexpected calcification. Skull: Normal. Negative for fracture or focal lesion. Sinuses/Orbits: No acute finding. Other: None. IMPRESSION: Chronic atrophic and ischemic changes without acute abnormality. Electronically Signed   By: [name] M.D.   On: 12/30/2018 03:07   Dg Hip Unilat W Or Wo Pelvis  2-3 Views Right  Result Date: 12/30/2018 CLINICAL DATA:  85 year old female with fall and right hip pain. EXAM: DG HIP (WITH OR WITHOUT PELVIS) 2-3V RIGHT COMPARISON:  Pelvic radiograph dated 06/01/2018 FINDINGS: There is a displaced fracture of the right femoral neck with proximal migration of the femoral shaft and impaction. No dislocation. The bones are osteopenic. The soft tissues are unremarkable. IMPRESSION: Displaced and impacted fracture of the right femoral neck. Electronically Signed   By: Anner Crete M.D.   On: 12/30/2018 02:47    Assessment and Plan:   1. Paroxysmal atrial fibrillation status post remote ablation - Maintaining sinus rhythm.  Eliquis on hold for surgery. CHADSVACs score of 5 for sex, age and CVA. - Resume Eliquis post surgery per direction of surgeon. - On Tikosyn (see below)  2.  Long QT -QTc 511 ms today, 459ms on 10/9, 431ms 9/24, 462ms on 4/27 -She is on Tikosyn and Abilify which both can prolong QTC -Benjaman Pott, EP NP reveiwed EKG with Dr. Lovena Le who felt QT is table.  Continue Tikosyn as current dose.  3.  Right hip fracture -Surgery tentatively scheduled on Wednesday -Hard to determine her activity status currently given somewhat lethargic state.  However, she lives by herself with little help.  Last echocardiogram April 2020 showed preserved LV function without wall motion abnormality.  Stress test in February 2013.  RCRI - class II risk, 0.9% risk of major cardiac event   Dr. Radford Pax to see. Will sign off. Call with questions.   For questions or updates, please contact [place] Please consult [email or web address] for contact info  under     Jarrett Soho, PA  12/30/2018 10:42 AM

## 2018-12-30 NOTE — H&P (Signed)
History and Physical    Katrina Rodriguez [id number] DOB: 07-31-1928 DOA: 12/30/2018  PCP: Leighton Ruff, MD Patient coming from: Home  Chief Complaint: Fall  HPI: Katrina Rodriguez is a 85 y.o. female with medical history significant of paroxysmal atrial fibrillation on Eliquis, complete heart block status post PPM, CVA with residual left-sided weakness, poor vision, hypothyroidism, TIA, thrombocytopenia presenting with complaints of right hip pain after a fall.  Patient states she has been unsteady when walking since her stroke.  She does see physical therapy.  Last night she was in the kitchen trying to put on shoes and somehow lost her balance and fell.  Since then she is having severe pain in her right hip which is worse with movement.  Denies any chest pain, heart palpitations, or shortness of breath prior to the fall.  ED Course: Hemodynamically stable.  No leukocytosis.  Hemoglobin normal.  X-ray of right hip and pelvis showing displaced and impacted fracture of the right femoral neck.  Chest x-ray negative with mild cardiomegaly and no acute cardiopulmonary process.  X-ray of right elbow showing no acute fracture or dislocation.  Head CT negative for acute finding.  Review of Systems:  All systems reviewed and apart from history of presenting illness, are negative.  Past Medical History:  Diagnosis Date  . Arthritis   . Atrial fibrillation (HCC)    History of ablation - Dr. Rayann Heman  . Chest pain    Has normal coronaries per cath in 2005; negative nuclear in 2010  . Chronic anemia   . Complete heart block ([place]) 06/09/2013  . H/O: CVA (cardiovascular accident)    Right internal capsule  . Hallucinations 12/19/2018  . Hypothyroidism 06/19/2017  . Left-sided weakness 06/19/2017  . Mild pulmonary hypertension ([place])   . 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 Orthostasis   . Retinal detachment   . Thrombocytopenia ([place])   . TIA (transient ischemic attack)   . Visual impairment  12/19/2018    Past Surgical History:  Procedure Laterality Date  . ATRIAL FIBRILLATION ABLATION    . CARDIAC CATHETERIZATION    . CARDIAC PACEMAKER PLACEMENT    . PERMANENT PACEMAKER GENERATOR CHANGE N/A 06/12/2012   Procedure: PERMANENT PACEMAKER GENERATOR CHANGE;  Surgeon: Evans Lance, MD;  Location: [facility] CATH LAB;  Service: Cardiovascular;  Laterality: N/A;  . RETINAL DETACHMENT SURGERY       reports that she has never smoked. She has never used smokeless tobacco. She reports that she does not drink alcohol or use drugs.  Allergies  Allergen Reactions  . Amiodarone Other (See Comments)    Caused thyroid problems  . Amoxicillin Hives  . Aspirin Other (See Comments)    On tikosyn and warfarin  . Codeine Other (See Comments)    Causes BP to drop  . Darvocet [Propoxyphene N-Acetaminophen] Hives  . Demerol Itching  . Eliquis [Apixaban] Nausea Only  . Penicillins Hives    Did it involve swelling of the face/tongue/throat, SOB, or low BP? Unknown Did it involve sudden or severe rash/hives, skin peeling, or any reaction on the inside of your mouth or nose? Unknown Did you need to seek medical attention at a hospital or doctor's office? Unknown When did it last happen?young adult or child?? If all above answers are "NO", may proceed with cephalosporin use.  Alveda Reasons [Rivaroxaban] Rash    Family History  Problem Relation Age of Onset  . Heart disease Father   . Stroke Father   .  Heart attack Father   . Heart disease Brother   . Heart disease Brother   . Hypertension Mother   . Diabetes Sister   . Heart attack Sister     Prior to Admission medications   Medication Sig Start Date End Date Taking? Authorizing Provider  acetaminophen (TYLENOL) 500 MG tablet Take 500 mg by mouth every 6 (six) hours as needed for moderate pain.   Yes [provider]  apixaban (ELIQUIS) 2.5 MG TABS tablet Take 1 tablet (2.5 mg total) by mouth 2 (two) times daily. 11/06/18  Yes  [name], Peter M, MD  dofetilide (TIKOSYN) 250 MCG capsule TAKE 1 CAPSULE (250 MCG TOTAL) BY MOUTH 2 (TWO) TIMES DAILY. Patient taking differently: Take 250 mcg by mouth 2 (two) times daily.  11/08/18  Yes Almyra Deforest, PA  potassium chloride SA (K-DUR,KLOR-CON) 20 MEQ tablet Take 2 tablets (40 mEq total) by mouth daily. Patient taking differently: Take 20 mEq by mouth daily as needed (pt pref).  02/06/18  Yes [name], Peter M, MD  QUEtiapine (SEROQUEL) 25 MG tablet Take 25 mg by mouth at bedtime. 12/21/18  Yes [provider]  traMADol (ULTRAM) 50 MG tablet Take 50 mg by mouth at bedtime as needed for moderate pain.  12/08/18  Yes [provider]  ARIPiprazole (ABILIFY) 10 MG tablet Take 1 tablet (10 mg total) by mouth daily. Patient not taking: Reported on 12/30/2018 12/19/18   Kathrynn Ducking, MD  fludrocortisone (FLORINEF) 0.1 MG tablet Take 1 tablet (0.1 mg total) by mouth 2 (two) times daily. 05/21/18   [name], Peter M, MD  rosuvastatin (CRESTOR) 10 MG tablet Take 1 tablet (10 mg total) by mouth daily. Patient not taking: Reported on 12/13/2018 04/29/18 12/13/18  [name], Peter M, MD    Physical Exam: Vitals:   12/30/18 0152 12/30/18 0200  BP:  123/62  Pulse:  62  Resp:  14  SpO2:  97%  Weight: 50.8 kg     Physical Exam  Constitutional: She is oriented to person, place, and time. No distress.  HENT:  Head: Normocephalic.  Eyes: Right eye exhibits no discharge. Left eye exhibits no discharge.  Neck: Neck supple.  Cardiovascular: Normal rate, regular rhythm and intact distal pulses.  Pulmonary/Chest: Effort normal and breath sounds normal. No respiratory distress. She has no wheezes. She has no rales.  Abdominal: Soft. Bowel sounds are normal. She exhibits no distension. There is no abdominal tenderness. There is no guarding.  Musculoskeletal:        General: No edema.     Comments: Right lower extremity shortened and externally rotated Dorsalis pedis pulse +2  bilaterally  Neurological: She is alert and oriented to person, place, and time.  Skin: Skin is warm and dry. She is not diaphoretic.     Labs on Admission: I have personally reviewed following labs and imaging studies  CBC: Recent Labs  Lab 12/30/18 0220  WBC 4.3  NEUTROABS 3.0  HGB 12.3  HCT 37.4  MCV 97.9  PLT 123XX123*   Basic Metabolic Panel: Recent Labs  Lab 12/30/18 0220  NA 139  K 3.7  CL 105  CO2 24  GLUCOSE 112*  BUN 33*  CREATININE 1.20*  CALCIUM 8.6*   GFR: Estimated Creatinine Clearance: 26 mL/min (A) (by C-G formula based on SCr of 1.2 mg/dL (H)). Liver Function Tests: No results for input(s): AST, ALT, ALKPHOS, BILITOT, PROT, ALBUMIN in the last 168 hours. No results for input(s): LIPASE, AMYLASE in the last 168  hours. No results for input(s): AMMONIA in the last 168 hours. Coagulation Profile: No results for input(s): INR, PROTIME in the last 168 hours. Cardiac Enzymes: No results for input(s): CKTOTAL, CKMB, CKMBINDEX, TROPONINI in the last 168 hours. BNP (last 3 results) No results for input(s): PROBNP in the last 8760 hours. HbA1C: No results for input(s): HGBA1C in the last 72 hours. CBG: No results for input(s): GLUCAP in the last 168 hours. Lipid Profile: No results for input(s): CHOL, HDL, LDLCALC, TRIG, CHOLHDL, LDLDIRECT in the last 72 hours. Thyroid Function Tests: No results for input(s): TSH, T4TOTAL, FREET4, T3FREE, THYROIDAB in the last 72 hours. Anemia Panel: No results for input(s): VITAMINB12, FOLATE, FERRITIN, TIBC, IRON, RETICCTPCT in the last 72 hours. Urine analysis:    Component Value Date/Time   COLORURINE YELLOW 07/01/2018 [place] 07/01/2018 1428   LABSPEC 1.015 07/01/2018 1428   PHURINE 6.0 07/01/2018 1428   GLUCOSEU NEGATIVE 07/01/2018 1428   HGBUR MODERATE (A) 07/01/2018 1428   BILIRUBINUR NEGATIVE 07/01/2018 1428   KETONESUR NEGATIVE 07/01/2018 1428   PROTEINUR NEGATIVE 07/01/2018 1428   NITRITE  NEGATIVE 07/01/2018 1428   LEUKOCYTESUR SMALL (A) 07/01/2018 1428    Radiological Exams on Admission: Dg Chest 1 View  Result Date: 12/30/2018 CLINICAL DATA:  85 year old female with right hip fracture. EXAM: CHEST  1 VIEW COMPARISON:  Chest radiograph dated 12/03/2018 FINDINGS: Evaluation is limited due to patient's positioning. Mild eventration of the right hemidiaphragm. No focal consolidation, pleural effusion, or pneumothorax. There is mild cardiomegaly. Left pectoral pacemaker device. Atherosclerotic calcification of the aorta. Osteopenia with degenerative changes of the spine. IMPRESSION: 1. No acute cardiopulmonary process. 2. Mild cardiomegaly. Electronically Signed   By: Anner Crete M.D.   On: 12/30/2018 02:48   Dg Elbow Complete Right  Result Date: 12/30/2018 CLINICAL DATA:  85 year old female with fall and right elbow pain. EXAM: RIGHT ELBOW - COMPLETE 3+ VIEW COMPARISON:  None. FINDINGS: No acute fracture or dislocation. The bones are osteopenic. No significant arthritic changes. No joint effusion. The soft tissues are unremarkable. IMPRESSION: No acute fracture or dislocation. Electronically Signed   By: Anner Crete M.D.   On: 12/30/2018 02:50   Ct Head Wo Contrast  Result Date: 12/30/2018 CLINICAL DATA:  Recent fall EXAM: CT HEAD WITHOUT CONTRAST TECHNIQUE: Contiguous axial images were obtained from the base of the skull through the vertex without intravenous contrast. COMPARISON:  12/13/2018 FINDINGS: Brain: Chronic atrophic and ischemic changes are noted. No findings to suggest acute hemorrhage, acute infarction or space-occupying mass lesion are noted. Vascular: No hyperdense vessel or unexpected calcification. Skull: Normal. Negative for fracture or focal lesion. Sinuses/Orbits: No acute finding. Other: None. IMPRESSION: Chronic atrophic and ischemic changes without acute abnormality. Electronically Signed   By: [name] M.D.   On: 12/30/2018 03:07   Dg Hip Unilat  W Or Wo Pelvis 2-3 Views Right  Result Date: 12/30/2018 CLINICAL DATA:  85 year old female with fall and right hip pain. EXAM: DG HIP (WITH OR WITHOUT PELVIS) 2-3V RIGHT COMPARISON:  Pelvic radiograph dated 06/01/2018 FINDINGS: There is a displaced fracture of the right femoral neck with proximal migration of the femoral shaft and impaction. No dislocation. The bones are osteopenic. The soft tissues are unremarkable. IMPRESSION: Displaced and impacted fracture of the right femoral neck. Electronically Signed   By: Anner Crete M.D.   On: 12/30/2018 02:47    EKG: Independently reviewed.  Sinus rhythm, nonspecific T wave abnormality.  QTc 511.  QTc was  491 on prior EKG from 12/13/2018.  Assessment/Plan Principal Problem:   Hip fracture (HCC) Active Problems:   PAROXYSMAL ATRIAL FIBRILLATION   QT prolongation   Thrombocytopenia (HCC)   Right hip fracture secondary to mechanical fall X-ray of right hip and pelvis showing displaced and impacted fracture of the right femoral neck. -ED provider discussed with [facility], will consult in a.m. -Pain management: Morphine as needed, Norco as needed -Nonweightbearing  Paroxysmal atrial fibrillation, history of ablation Currently in sinus rhythm. -Hold Eliquis in anticipation of surgery -Currently on Tikosyn, last dose at home was 10/25.  Prior EKG from 12/13/2018 with QTC 491.  QTc now slightly worsened at 511.  Decision regarding resuming this medication needs to be discussed with cardiology in the morning.  QT prolongation on EKG -Takes Tikosyn at home.  Please discuss with cardiology in a.m. as mentioned above. -Also takes Seroquel, will hold at this time. -Keep potassium above 4 and magnesium above 2 -Cardiac monitoring -Repeat EKG in a.m.  Chronic mild thrombocytopenia Platelet count 126,000, chronically mildly low.  No signs of active bleeding. -Continue to monitor  DVT prophylaxis: SCDs Code Status: Patient wishes to be DNR.  Family Communication: No family available. Disposition Plan: Anticipate discharge improvement. Consults called: Orthopedics Admission status: It is my clinical opinion that admission to [facility] is reasonable and necessary in this 85 y.o. female . presenting with right hip fracture secondary to mechanical fall.  Ortho will see the patient in the morning and she will likely undergo surgery.  Given the aforementioned, the predictability of an adverse outcome is felt to be significant. I expect that the patient will require at least 2 midnights in the hospital to treat this condition.   The medical decision making on this patient was of high complexity and the patient is at high risk for clinical deterioration, therefore this is a level 3 visit.  Shela Leff MD [facility] Pager [phone]  If 7PM-7AM, please contact night-coverage [email or web address] Password [email or web address]  12/30/2018, 5:17 AM

## 2018-12-30 NOTE — Progress Notes (Signed)
Cardiac tele monitor person called and has printed a heart rate of 218.  The nurse was at the bedside at the time of "incident"- the patient was being moved in order to take pain medication.

## 2018-12-30 NOTE — H&P (View-Only) (Signed)
Reason for Consult:Right hip fx Referring Physician: D Tat  Katrina Rodriguez is an 85 y.o. female.  HPI: Katrina Rodriguez was trying to put on her shoes and lost her balance and fell. She had immediate pain and could not get up. She was brought to the ED where x-rays showed a femoral neck fx and orthopedic surgery was consulted. She c/o localized pain to the hip. She has fallen multiple times this year.  Past Medical History:  Diagnosis Date  . Arthritis   . Atrial fibrillation (HCC)    History of ablation - Dr. Rayann Heman  . Chest pain    Has normal coronaries per cath in 2005; negative nuclear in 2010  . Chronic anemia   . Complete heart block ([place]) 06/09/2013  . H/O: CVA (cardiovascular accident)    Right internal capsule  . Hallucinations 12/19/2018  . Hypothyroidism 06/19/2017  . Left-sided weakness 06/19/2017  . Mild pulmonary hypertension ([place])   . 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 Orthostasis   . Retinal detachment   . Thrombocytopenia ([place])   . TIA (transient ischemic attack)   . Visual impairment 12/19/2018    Past Surgical History:  Procedure Laterality Date  . ATRIAL FIBRILLATION ABLATION    . CARDIAC CATHETERIZATION    . CARDIAC PACEMAKER PLACEMENT    . PERMANENT PACEMAKER GENERATOR CHANGE N/A 06/12/2012   Procedure: PERMANENT PACEMAKER GENERATOR CHANGE;  Surgeon: Evans Lance, MD;  Location[facility] C[facility] LAB;  Service: Cardiovascular;  Laterality: N/A;  . RETINAL DETACHMENT SURGERY      Family History  Problem Relation Age of Onset  . Heart disease Father   . Stroke Father   . Heart attack Father   . Heart disease Brother   . Heart disease Brother   . Hypertension Mother   . Diabetes Sister   . Heart attack Sister     Social History:  reports that she has never smoked. She has never used smokeless tobacco. She reports that she does not drink alcohol or use drugs.  Allergies:  Allergies  Allergen Reactions  . Amiodarone Other (See Comments)    Caused thyroid problems   . Amoxicillin Hives  . Aspirin Other (See Comments)    On tikosyn and warfarin  . Codeine Other (See Comments)    Causes BP to drop  . Darvocet [Propoxyphene N-Acetaminophen] Hives  . Demerol Itching  . Eliquis [Apixaban] Nausea Only  . Penicillins Hives    Did it involve swelling of the face/tongue/throat, SOB, or low BP? Unknown Did it involve sudden or severe rash/hives, skin peeling, or any reaction on the inside of your mouth or nose? Unknown Did you need to seek medical attention at a hospital or doctor's office? Unknown When did it last happen?young adult or child?? If all above answers are "NO", may proceed with cephalosporin use.  Alveda Reasons [Rivaroxaban] Rash    Medications: I have reviewed the patient's current medications.  Results for orders placed or performed during the hospital encounter of 12/30/18 (from the past 48 hour(s))  SARS CORONAVIRUS 2 (TAT 6-24 HRS) Nasopharyngeal Nasopharyngeal Swab     Status: None   Collection Time: 12/30/18  2:05 AM   Specimen: Nasopharyngeal Swab  Result Value Ref Range   SARS Coronavirus 2 NEGATIVE NEGATIVE    Comment: (NOTE) SARS-CoV-2 target nucleic acids are NOT DETECTED. The SARS-CoV-2 RNA is generally detectable in upper and lower respiratory specimens during the acute phase of infection. Negative results do not preclude SARS-CoV-2  infection, do not rule out co-infections with other pathogens, and should not be used as the sole basis for treatment or other patient management decisions. Negative results must be combined with clinical observations, patient history, and epidemiological information. The expected result is Negative. Fact Sheet for Patients: SugarRoll.be Fact Sheet for Healthcare Providers: https://www.woods-mathews.com/ This test is not yet approved or cleared by the [place] FDA and  has been authorized for detection and/or diagnosis of SARS-CoV-2 by FDA  under an Emergency Use Authorization (EUA). This EUA will remain  in effect (meaning this test can be used) for the duration of the COVID-19 declaration under Section 56 4(b)(1) of the Act, 21 U.S.C. section 360bbb-3(b)(1), unless the authorization is terminated or revoked sooner. Performed at [facility], [place] [address], [place], [place] [address]   CBC with Differential     Status: Abnormal   Collection Time: 12/30/18  2:20 AM  Result Value Ref Range   WBC 4.3 4.0 - 10.5 K/uL   RBC 3.82 (L) 3.87 - 5.11 MIL/uL   Hemoglobin 12.3 12.0 - 15.0 g/dL   HCT 37.4 36.0 - 46.0 %   MCV 97.9 80.0 - 100.0 fL   MCH 32.2 26.0 - 34.0 pg   MCHC 32.9 30.0 - 36.0 g/dL   RDW 13.5 11.5 - 15.5 %   Platelets 126 (L) 150 - 400 K/uL   nRBC 0.0 0.0 - 0.2 %   Neutrophils Relative % 68 %   Neutro Abs 3.0 1.7 - 7.7 K/uL   Lymphocytes Relative 18 %   Lymphs Abs 0.8 0.7 - 4.0 K/uL   Monocytes Relative 11 %   Monocytes Absolute 0.5 0.1 - 1.0 K/uL   Eosinophils Relative 1 %   Eosinophils Absolute 0.1 0.0 - 0.5 K/uL   Basophils Relative 1 %   Basophils Absolute 0.0 0.0 - 0.1 K/uL   Immature Granulocytes 1 %   Abs Immature Granulocytes 0.02 0.00 - 0.07 K/uL    Comment: Performed at [facility] [address], [place], [place] [id number]  Basic metabolic panel     Status: Abnormal   Collection Time: 12/30/18  2:20 AM  Result Value Ref Range   Sodium 139 135 - 145 mmol/L   Potassium 3.7 3.5 - 5.1 mmol/L   Chloride 105 98 - 111 mmol/L   CO2 24 22 - 32 mmol/L   Glucose, Bld 112 (H) 70 - 99 mg/dL   BUN 33 (H) 8 - 23 mg/dL   Creatinine, Ser 1.20 (H) 0.44 - 1.00 mg/dL   Calcium 8.6 (L) 8.9 - 10.3 mg/dL   GFR calc non Af Amer 40 (L) >60 mL/min   GFR calc Af Amer 47 (L) >60 mL/min   Anion gap 10 5 - 15    Comment: Performed at [facility] [address], [place], [place] [address]  CBG monitoring, ED     Status: Abnormal   Collection Time: 12/30/18  5:25 AM  Result Value Ref Range    Glucose-Capillary 111 (H) 70 - 99 mg/dL  CBC     Status: Abnormal   Collection Time: 12/30/18  8:40 AM  Result Value Ref Range   WBC 8.0 4.0 - 10.5 K/uL   RBC 3.75 (L) 3.87 - 5.11 MIL/uL   Hemoglobin 11.9 (L) 12.0 - 15.0 g/dL   HCT 36.4 36.0 - 46.0 %   MCV 97.1 80.0 - 100.0 fL   MCH 31.7 26.0 - 34.0 pg   MCHC  32.7 30.0 - 36.0 g/dL   RDW 13.8 11.5 - 15.5 %   Platelets 109 (L) 150 - 400 K/uL    Comment: REPEATED TO VERIFY Immature Platelet Fraction may be clinically indicated, consider ordering this additional test [id number]    nRBC 0.0 0.0 - 0.2 %    Comment: Performed at [facility] [address] [place], [place] [address]    Dg Chest 1 View  Result Date: 12/30/2018 CLINICAL DATA:  85 year old female with right hip fracture. EXAM: CHEST  1 VIEW COMPARISON:  Chest radiograph dated 12/03/2018 FINDINGS: Evaluation is limited due to patient's positioning. Mild eventration of the right hemidiaphragm. No focal consolidation, pleural effusion, or pneumothorax. There is mild cardiomegaly. Left pectoral pacemaker device. Atherosclerotic calcification of the aorta. Osteopenia with degenerative changes of the spine. IMPRESSION: 1. No acute cardiopulmonary process. 2. Mild cardiomegaly. Electronically Signed   By: Anner Crete M.D.   On: 12/30/2018 02:48   Dg Elbow Complete Right  Result Date: 12/30/2018 CLINICAL DATA:  85 year old female with fall and right elbow pain. EXAM: RIGHT ELBOW - COMPLETE 3+ VIEW COMPARISON:  None. FINDINGS: No acute fracture or dislocation. The bones are osteopenic. No significant arthritic changes. No joint effusion. The soft tissues are unremarkable. IMPRESSION: No acute fracture or dislocation. Electronically Signed   By: Anner Crete M.D.   On: 12/30/2018 02:50   Ct Head Wo Contrast  Result Date: 12/30/2018 CLINICAL DATA:  Recent fall EXAM: CT HEAD WITHOUT CONTRAST TECHNIQUE: Contiguous axial images were obtained from the base of the skull  through the vertex without intravenous contrast. COMPARISON:  12/13/2018 FINDINGS: Brain: Chronic atrophic and ischemic changes are noted. No findings to suggest acute hemorrhage, acute infarction or space-occupying mass lesion are noted. Vascular: No hyperdense vessel or unexpected calcification. Skull: Normal. Negative for fracture or focal lesion. Sinuses/Orbits: No acute finding. Other: None. IMPRESSION: Chronic atrophic and ischemic changes without acute abnormality. Electronically Signed   By: [name] M.D.   On: 12/30/2018 03:07   Dg Hip Unilat W Or Wo Pelvis 2-3 Views Right  Result Date: 12/30/2018 CLINICAL DATA:  85 year old female with fall and right hip pain. EXAM: DG HIP (WITH OR WITHOUT PELVIS) 2-3V RIGHT COMPARISON:  Pelvic radiograph dated 06/01/2018 FINDINGS: There is a displaced fracture of the right femoral neck with proximal migration of the femoral shaft and impaction. No dislocation. The bones are osteopenic. The soft tissues are unremarkable. IMPRESSION: Displaced and impacted fracture of the right femoral neck. Electronically Signed   By: Anner Crete M.D.   On: 12/30/2018 02:47    Review of Systems  Constitutional: Negative for weight loss.  HENT: Negative for ear discharge, ear pain, hearing loss and tinnitus.   Eyes: Negative for blurred vision, double vision, photophobia and pain.  Respiratory: Negative for cough, sputum production and shortness of breath.   Cardiovascular: Negative for chest pain.  Gastrointestinal: Negative for abdominal pain, nausea and vomiting.  Genitourinary: Negative for dysuria, flank pain, frequency and urgency.  Musculoskeletal: Positive for joint pain (Right hip). Negative for back pain, falls, myalgias and neck pain.  Neurological: Negative for dizziness, tingling, sensory change, focal weakness, loss of consciousness and headaches.  Endo/Heme/Allergies: Does not bruise/bleed easily.  Psychiatric/Behavioral: Negative for depression,  memory loss and substance abuse. The patient is not nervous/anxious.    Blood pressure (!) 115/50, pulse 68, resp. rate 14, weight 50.8 kg, SpO2 93 %. Physical Exam  Constitutional: She appears well-developed and well-nourished. No distress.  HENT:  Head: Normocephalic and atraumatic.  Eyes: Conjunctivae are normal. Right eye exhibits no discharge. Left eye exhibits no discharge. No scleral icterus.  Neck: Normal range of motion.  Cardiovascular: Normal rate and regular rhythm.  Respiratory: Effort normal. No respiratory distress.  Musculoskeletal:     Comments: LLE No traumatic wounds, ecchymosis, or rash  Mild TTP hip, mod TTP knee  No knee or ankle effusion  Knee stable to varus/ valgus and anterior/posterior stress  Sens DPN, SPN, TN intact  Motor EHL, ext, flex, evers 5/5  DP 2+, PT 0, No significant edema  Neurological: She is alert.  Skin: Skin is warm and dry. She is not diaphoretic.  Psychiatric: She has a normal mood and affect. Her behavior is normal.    Assessment/Plan: Right hip fx -- plan hip hemi with Dr. Lyla Glassing on Wednesday afternoon to give time for Eliquis to wear off and for cardiology to address her prolonged Qtc. Will await clearance but will go ahead and schedule. Multiple medical problems including paroxysmal atrial fibrillation on Eliquis, complete heart block status post PPM, CVA with residual left-sided weakness, poor vision, hypothyroidism, TIA, thrombocytopenia -- per primary service    Lisette Abu, PA-C Orthopedic Surgery [phone] 12/30/2018, 9:38 AM

## 2018-12-30 NOTE — ED Notes (Signed)
The pt is having more pain when she moves

## 2018-12-30 NOTE — Care Management (Signed)
CM following to assist with any SNF/HH/DME needs. Awaiting PT/OT eval for DCP recommendations and will continue to follow.  Midge Minium RN, BSN, NCM-BC, ACM-RN [phone]

## 2018-12-30 NOTE — ED Triage Notes (Signed)
The pt arrived by gems from home  She was getting off her chair lift and she fell  When she stumbled and fell  Onto her rt side.  C/o pain in her rt hip  Alert   The pt is blind

## 2018-12-30 NOTE — Progress Notes (Signed)
PROGRESS NOTE  Katrina Rodriguez [id number] DOB: 1928-03-28 DOA: 12/30/2018 PCP: Leighton Ruff, MD  Brief History:  85 year old female with history of paroxysmal atrial fibrillation on apixaban, complete heart block status post PPM, stroke with left hemiparesis, hypothyroidism, thrombocytopenia, orthostatic hypotension presenting after mechanical fall resulting in right hip pain.  The patient has had gait instability since her previous stroke.  She lost her balance and fell onto her right side.  In the ED, x-ray showed impacted right femoral neck fracture.  [facility] was consulted to assist with management.  Cardiology was consulted for cardiac clearance.  Assessment/Plan: Right hip fracture secondary to mechanical fall X-ray of right hip and pelvis showing displaced and impacted fracture of the right femoral neck. -ED provider discussed with [facility], will consult in a.m. -Pain management: Morphine as needed, Norco as needed -Nonweightbearing -cardiology consulted for cardiac clearance  Paroxysmal atrial fibrillation, history of ablation -Currently in sinus rhythm. -Hold Eliquis in anticipation of surgery -Currently on Tikosyn, last dose at home was 10/25.   -Prior EKG from 12/13/2018 with QTC 491.  QTc now slightly worsened at 511.   -Decision regarding resuming this medication per cardiology -personally reviewed EKG--sinus, nonspecific T wave change  QT prolongation on EKG -Takes Tikosyn at home.   -cardiology consulted -Also takes Seroquel, will hold at this time. -Keep potassium above 4 and magnesium above 2 -Cardiac monitoring  Chronic mild thrombocytopenia Platelet count 126,000 -chronic dating back to 2010 -Continue to monitor       Disposition Plan:   Home in 2-3 days  Family Communication:   No Family at bedside  Consultants:  [facility] cardiology, [facility]  Code Status:  FULL  DVT Prophylaxis:  apixaban   Procedures: As Listed in  Progress Note Above  Antibiotics: None   Total time spent 35 minutes.  Greater than 50% spent face to face counseling and coordinating care.      Subjective: Patient denies fevers, chills, headache, chest pain, dyspnea, nausea, vomiting, diarrhea, abdominal pain, dysuria,    Objective: Vitals:   12/30/18 0630 12/30/18 0645 12/30/18 0715 12/30/18 0730  BP: 125/63 (!) 116/53 115/64 (!) 115/50  Pulse: 68     Resp: 14 14 19 14   SpO2: 93%     Weight:       No intake or output data in the 24 hours ending 12/30/18 0853 Weight change:  Exam:   General:  Pt is alert, follows commands appropriately, not in acute distress  HEENT: No icterus, No thrush, No neck mass, [place]/AT  Cardiovascular: RRR, S1/S2, no rubs, no gallops  Respiratory: CTA bilaterally, no wheezing, no crackles, no rhonchi  Abdomen: Soft/+BS, non tender, non distended, no guarding  Extremities: 1+ LE edema, No lymphangitis, No petechiae, No rashes, no synovitis   Data Reviewed: I have personally reviewed following labs and imaging studies Basic Metabolic Panel: Recent Labs  Lab 12/30/18 0220  NA 139  K 3.7  CL 105  CO2 24  GLUCOSE 112*  BUN 33*  CREATININE 1.20*  CALCIUM 8.6*   Liver Function Tests: No results for input(s): AST, ALT, ALKPHOS, BILITOT, PROT, ALBUMIN in the last 168 hours. No results for input(s): LIPASE, AMYLASE in the last 168 hours. No results for input(s): AMMONIA in the last 168 hours. Coagulation Profile: No results for input(s): INR, PROTIME in the last 168 hours. CBC: Recent Labs  Lab 12/30/18 0220  WBC 4.3  NEUTROABS 3.0  HGB 12.3  HCT  37.4  MCV 97.9  PLT 126*   Cardiac Enzymes: No results for input(s): CKTOTAL, CKMB, CKMBINDEX, TROPONINI in the last 168 hours. BNP: Invalid input(s): POCBNP CBG: Recent Labs  Lab 12/30/18 0525  GLUCAP 111*   HbA1C: No results for input(s): HGBA1C in the last 72 hours. Urine analysis:    Component Value Date/Time    COLORURINE YELLOW 07/01/2018 [place] 07/01/2018 1428   LABSPEC 1.015 07/01/2018 1428   PHURINE 6.0 07/01/2018 1428   GLUCOSEU NEGATIVE 07/01/2018 1428   HGBUR MODERATE (A) 07/01/2018 1428   BILIRUBINUR NEGATIVE 07/01/2018 1428   KETONESUR NEGATIVE 07/01/2018 1428   PROTEINUR NEGATIVE 07/01/2018 1428   NITRITE NEGATIVE 07/01/2018 1428   LEUKOCYTESUR SMALL (A) 07/01/2018 1428   Sepsis Labs: @LABRCNTIP (procalcitonin:4,lacticidven:4) ) Recent Results (from the past 240 hour(s))  SARS CORONAVIRUS 2 (Yaeli Hartung 6-24 HRS) Nasopharyngeal Nasopharyngeal Swab     Status: None   Collection Time: 12/30/18  2:05 AM   Specimen: Nasopharyngeal Swab  Result Value Ref Range Status   SARS Coronavirus 2 NEGATIVE NEGATIVE Final    Comment: (NOTE) SARS-CoV-2 target nucleic acids are NOT DETECTED. The SARS-CoV-2 RNA is generally detectable in upper and lower respiratory specimens during the acute phase of infection. Negative results do not preclude SARS-CoV-2 infection, do not rule out co-infections with other pathogens, and should not be used as the sole basis for treatment or other patient management decisions. Negative results must be combined with clinical observations, patient history, and epidemiological information. The expected result is Negative. Fact Sheet for Patients: SugarRoll.be Fact Sheet for Healthcare Providers: https://www.woods-mathews.com/ This test is not yet approved or cleared by the [place] FDA and  has been authorized for detection and/or diagnosis of SARS-CoV-2 by FDA under an Emergency Use Authorization (EUA). This EUA will remain  in effect (meaning this test can be used) for the duration of the COVID-19 declaration under Section 56 4(b)(1) of the Act, 21 U.S.C. section 360bbb-3(b)(1), unless the authorization is terminated or revoked sooner. Performed at [facility], [place] [address] [place],  [place] [address]      Scheduled Meds: Continuous Infusions:  Procedures/Studies: Dg Chest 1 View  Result Date: 12/30/2018 CLINICAL DATA:  85 year old female with right hip fracture. EXAM: CHEST  1 VIEW COMPARISON:  Chest radiograph dated 12/03/2018 FINDINGS: Evaluation is limited due to patient's positioning. Mild eventration of the right hemidiaphragm. No focal consolidation, pleural effusion, or pneumothorax. There is mild cardiomegaly. Left pectoral pacemaker device. Atherosclerotic calcification of the aorta. Osteopenia with degenerative changes of the spine. IMPRESSION: 1. No acute cardiopulmonary process. 2. Mild cardiomegaly. Electronically Signed   By: Anner Crete M.D.   On: 12/30/2018 02:48   Dg Elbow Complete Right  Result Date: 12/30/2018 CLINICAL DATA:  85 year old female with fall and right elbow pain. EXAM: RIGHT ELBOW - COMPLETE 3+ VIEW COMPARISON:  None. FINDINGS: No acute fracture or dislocation. The bones are osteopenic. No significant arthritic changes. No joint effusion. The soft tissues are unremarkable. IMPRESSION: No acute fracture or dislocation. Electronically Signed   By: Anner Crete M.D.   On: 12/30/2018 02:50   Ct Head Wo Contrast  Result Date: 12/30/2018 CLINICAL DATA:  Recent fall EXAM: CT HEAD WITHOUT CONTRAST TECHNIQUE: Contiguous axial images were obtained from the base of the skull through the vertex without intravenous contrast. COMPARISON:  12/13/2018 FINDINGS: Brain: Chronic atrophic and ischemic changes are noted. No findings to suggest acute hemorrhage, acute infarction or space-occupying mass lesion are noted. Vascular:  No hyperdense vessel or unexpected calcification. Skull: Normal. Negative for fracture or focal lesion. Sinuses/Orbits: No acute finding. Other: None. IMPRESSION: Chronic atrophic and ischemic changes without acute abnormality. Electronically Signed   By: [name] M.D.   On: 12/30/2018 03:07   Ct Head Wo Contrast  Result Date:  12/13/2018 CLINICAL DATA:  Syncope. EXAM: CT HEAD WITHOUT CONTRAST CT CERVICAL SPINE WITHOUT CONTRAST TECHNIQUE: Multidetector CT imaging of the head and cervical spine was performed following the standard protocol without intravenous contrast. Multiplanar CT image reconstructions of the cervical spine were also generated. COMPARISON:  CT head and cervical spine dated November 28, 2018. FINDINGS: CT HEAD FINDINGS Brain: No evidence of acute infarction, hemorrhage, hydrocephalus, extra-axial collection or mass lesion/mass effect. Stable atrophy and chronic microvascular ischemic changes. Vascular: Atherosclerotic vascular calcification of the carotid siphons. No hyperdense vessel. Skull: Normal. Negative for fracture or focal lesion. Sinuses/Orbits: No acute finding. Stable hyperdensity of the left globe likely related to prior intra-ocular silicone injection. Other: None. CT CERVICAL SPINE FINDINGS Alignment: Normal. Skull base and vertebrae: No acute fracture. No primary bone lesion or focal pathologic process. Soft tissues and spinal canal: No prevertebral fluid or swelling. No visible canal hematoma. Disc levels: Stable diffuse cervical spondylosis with advanced right-sided neuroforaminal stenosis at C4-C5 and C5-C6 due to uncovertebral hypertrophy. Upper chest: Negative. Other: None. IMPRESSION: 1.  No acute intracranial abnormality. 2.  No acute cervical spine fracture. Electronically Signed   By: Titus Dubin M.D.   On: 12/13/2018 22:54   Ct Cervical Spine Wo Contrast  Result Date: 12/13/2018 CLINICAL DATA:  Syncope. EXAM: CT HEAD WITHOUT CONTRAST CT CERVICAL SPINE WITHOUT CONTRAST TECHNIQUE: Multidetector CT imaging of the head and cervical spine was performed following the standard protocol without intravenous contrast. Multiplanar CT image reconstructions of the cervical spine were also generated. COMPARISON:  CT head and cervical spine dated November 28, 2018. FINDINGS: CT HEAD FINDINGS Brain: No  evidence of acute infarction, hemorrhage, hydrocephalus, extra-axial collection or mass lesion/mass effect. Stable atrophy and chronic microvascular ischemic changes. Vascular: Atherosclerotic vascular calcification of the carotid siphons. No hyperdense vessel. Skull: Normal. Negative for fracture or focal lesion. Sinuses/Orbits: No acute finding. Stable hyperdensity of the left globe likely related to prior intra-ocular silicone injection. Other: None. CT CERVICAL SPINE FINDINGS Alignment: Normal. Skull base and vertebrae: No acute fracture. No primary bone lesion or focal pathologic process. Soft tissues and spinal canal: No prevertebral fluid or swelling. No visible canal hematoma. Disc levels: Stable diffuse cervical spondylosis with advanced right-sided neuroforaminal stenosis at C4-C5 and C5-C6 due to uncovertebral hypertrophy. Upper chest: Negative. Other: None. IMPRESSION: 1.  No acute intracranial abnormality. 2.  No acute cervical spine fracture. Electronically Signed   By: Titus Dubin M.D.   On: 12/13/2018 22:54   Dg Hip Unilat W Or Wo Pelvis 2-3 Views Right  Result Date: 12/30/2018 CLINICAL DATA:  85 year old female with fall and right hip pain. EXAM: DG HIP (WITH OR WITHOUT PELVIS) 2-3V RIGHT COMPARISON:  Pelvic radiograph dated 06/01/2018 FINDINGS: There is a displaced fracture of the right femoral neck with proximal migration of the femoral shaft and impaction. No dislocation. The bones are osteopenic. The soft tissues are unremarkable. IMPRESSION: Displaced and impacted fracture of the right femoral neck. Electronically Signed   By: Anner Crete M.D.   On: 12/30/2018 02:47    Orson Eva, DO  [facility] Pager [phone]  If 7PM-7AM, please contact night-coverage [email or web address] Password [email or web address] 12/30/2018, 8:53 AM   LOS: 0  days

## 2018-12-30 NOTE — ED Notes (Signed)
ED TO INPATIENT HANDOFF REPORT  ED Nurse Name and Phone #: Luellen Pucker [phone]  S Name/Age/Gender Katrina Rodriguez 85 y.o. female Room/Bed: 019C/019C  Code Status   Code Status: DNR  Home/SNF/Other Rehab Patient oriented to: self, place, time and situation Is this baseline? Yes   Triage Complete: Triage complete  Chief Complaint fall hip injury  Triage Note The pt arrived by gems from home  She was getting off her chair lift and she fell  When she stumbled and fell  Onto her rt side.  C/o pain in her rt hip  Alert   The pt is blind   Allergies Allergies  Allergen Reactions  . Amiodarone Other (See Comments)    Caused thyroid problems  . Amoxicillin Hives  . Aspirin Other (See Comments)    On tikosyn and warfarin  . Codeine Other (See Comments)    Causes BP to drop  . Darvocet [Propoxyphene N-Acetaminophen] Hives  . Demerol Itching  . Eliquis [Apixaban] Nausea Only  . Penicillins Hives    Did it involve swelling of the face/tongue/throat, SOB, or low BP? Unknown Did it involve sudden or severe rash/hives, skin peeling, or any reaction on the inside of your mouth or nose? Unknown Did you need to seek medical attention at a hospital or doctor's office? Unknown When did it last happen?young adult or child?? If all above answers are "NO", may proceed with cephalosporin use.  Alveda Reasons [Rivaroxaban] Rash    Level of Care/Admitting Diagnosis ED Disposition    ED Disposition Condition [facility] Area: [place] [[id number]]  Level of Care: Telemetry Cardiac [103]  Covid Evaluation: Asymptomatic Screening Protocol (No Symptoms)  [facility]: [id number]  Admitting Physician: Shela Leff [id number]  Attending Physician: Shela Leff [phone]  Estimated length of stay: past midnight tomorrow  Certification:: I certify this patient will need inpatient services for at least 2 midnights  PT Class (Do Not Modify):  Inpatient [101]  PT Acc Code (Do Not Modify): Private [1]       B Medical/Surgery History Past Medical History:  Diagnosis Date  . Arthritis   . Atrial fibrillation (HCC)    History of ablation - Dr. Rayann Heman  . Chest pain    Has normal coronaries per cath in 2005; negative nuclear in 2010  . Chronic anemia   . Complete heart block ([place]) 06/09/2013  . H/O: CVA (cardiovascular accident)    Right internal capsule  . Hallucinations 12/19/2018  . Hypothyroidism 06/19/2017  . Left-sided weakness 06/19/2017  . Mild pulmonary hypertension ([place])   . 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 Orthostasis   . Retinal detachment   . Thrombocytopenia ([place])   . TIA (transient ischemic attack)   . Visual impairment 12/19/2018   Past Surgical History:  Procedure Laterality Date  . ATRIAL FIBRILLATION ABLATION    . CARDIAC CATHETERIZATION    . CARDIAC PACEMAKER PLACEMENT    . PERMANENT PACEMAKER GENERATOR CHANGE N/A 06/12/2012   Procedure: PERMANENT PACEMAKER GENERATOR CHANGE;  Surgeon: Evans Lance, MD;  Location: [facility] CATH LAB;  Service: Cardiovascular;  Laterality: N/A;  . RETINAL DETACHMENT SURGERY       A IV Location/Drains/Wounds Patient Lines/Drains/Airways Status   Active Line/Drains/Airways    Name:   Placement date:   Placement time:   Site:   Days:   Peripheral IV 12/30/18 Left Forearm   12/30/18    0330    Forearm  less than 1   External Urinary Catheter   12/13/18    2156    -   17          Intake/Output Last 24 hours No intake or output data in the 24 hours ending 12/30/18 0747  Labs/Imaging Results for orders placed or performed during the hospital encounter of 12/30/18 (from the past 48 hour(s))  SARS CORONAVIRUS 2 (TAT 6-24 HRS) Nasopharyngeal Nasopharyngeal Swab     Status: None   Collection Time: 12/30/18  2:05 AM   Specimen: Nasopharyngeal Swab  Result Value Ref Range   SARS Coronavirus 2 NEGATIVE NEGATIVE    Comment: (NOTE) SARS-CoV-2 target nucleic acids  are NOT DETECTED. The SARS-CoV-2 RNA is generally detectable in upper and lower respiratory specimens during the acute phase of infection. Negative results do not preclude SARS-CoV-2 infection, do not rule out co-infections with other pathogens, and should not be used as the sole basis for treatment or other patient management decisions. Negative results must be combined with clinical observations, patient history, and epidemiological information. The expected result is Negative. Fact Sheet for Patients: SugarRoll.be Fact Sheet for Healthcare Providers: https://www.woods-mathews.com/ This test is not yet approved or cleared by the [place] FDA and  has been authorized for detection and/or diagnosis of SARS-CoV-2 by FDA under an Emergency Use Authorization (EUA). This EUA will remain  in effect (meaning this test can be used) for the duration of the COVID-19 declaration under Section 56 4(b)(1) of the Act, 21 U.S.C. section 360bbb-3(b)(1), unless the authorization is terminated or revoked sooner. Performed at [facility], [place] [address], [place], [place] [address]   CBC with Differential     Status: Abnormal   Collection Time: 12/30/18  2:20 AM  Result Value Ref Range   WBC 4.3 4.0 - 10.5 K/uL   RBC 3.82 (L) 3.87 - 5.11 MIL/uL   Hemoglobin 12.3 12.0 - 15.0 g/dL   HCT 37.4 36.0 - 46.0 %   MCV 97.9 80.0 - 100.0 fL   MCH 32.2 26.0 - 34.0 pg   MCHC 32.9 30.0 - 36.0 g/dL   RDW 13.5 11.5 - 15.5 %   Platelets 126 (L) 150 - 400 K/uL   nRBC 0.0 0.0 - 0.2 %   Neutrophils Relative % 68 %   Neutro Abs 3.0 1.7 - 7.7 K/uL   Lymphocytes Relative 18 %   Lymphs Abs 0.8 0.7 - 4.0 K/uL   Monocytes Relative 11 %   Monocytes Absolute 0.5 0.1 - 1.0 K/uL   Eosinophils Relative 1 %   Eosinophils Absolute 0.1 0.0 - 0.5 K/uL   Basophils Relative 1 %   Basophils Absolute 0.0 0.0 - 0.1 K/uL   Immature Granulocytes 1 %   Abs Immature Granulocytes  0.02 0.00 - 0.07 K/uL    Comment: Performed at [facility] [address], [place], [place] [id number]  Basic metabolic panel     Status: Abnormal   Collection Time: 12/30/18  2:20 AM  Result Value Ref Range   Sodium 139 135 - 145 mmol/L   Potassium 3.7 3.5 - 5.1 mmol/L   Chloride 105 98 - 111 mmol/L   CO2 24 22 - 32 mmol/L   Glucose, Bld 112 (H) 70 - 99 mg/dL   BUN 33 (H) 8 - 23 mg/dL   Creatinine, Ser 1.20 (H) 0.44 - 1.00 mg/dL   Calcium 8.6 (L) 8.9 - 10.3 mg/dL   GFR calc non Af Amer 40 (L) >  60 mL/min   GFR calc Af Amer 47 (L) >60 mL/min   Anion gap 10 5 - 15    Comment: Performed at [facility] [address], [place], [place] [address]  CBG monitoring, ED     Status: Abnormal   Collection Time: 12/30/18  5:25 AM  Result Value Ref Range   Glucose-Capillary 111 (H) 70 - 99 mg/dL   Dg Chest 1 View  Result Date: 12/30/2018 CLINICAL DATA:  85 year old female with right hip fracture. EXAM: CHEST  1 VIEW COMPARISON:  Chest radiograph dated 12/03/2018 FINDINGS: Evaluation is limited due to patient's positioning. Mild eventration of the right hemidiaphragm. No focal consolidation, pleural effusion, or pneumothorax. There is mild cardiomegaly. Left pectoral pacemaker device. Atherosclerotic calcification of the aorta. Osteopenia with degenerative changes of the spine. IMPRESSION: 1. No acute cardiopulmonary process. 2. Mild cardiomegaly. Electronically Signed   By: Anner Crete M.D.   On: 12/30/2018 02:48   Dg Elbow Complete Right  Result Date: 12/30/2018 CLINICAL DATA:  85 year old female with fall and right elbow pain. EXAM: RIGHT ELBOW - COMPLETE 3+ VIEW COMPARISON:  None. FINDINGS: No acute fracture or dislocation. The bones are osteopenic. No significant arthritic changes. No joint effusion. The soft tissues are unremarkable. IMPRESSION: No acute fracture or dislocation. Electronically Signed   By: Anner Crete M.D.   On: 12/30/2018 02:50   Ct Head Wo  Contrast  Result Date: 12/30/2018 CLINICAL DATA:  Recent fall EXAM: CT HEAD WITHOUT CONTRAST TECHNIQUE: Contiguous axial images were obtained from the base of the skull through the vertex without intravenous contrast. COMPARISON:  12/13/2018 FINDINGS: Brain: Chronic atrophic and ischemic changes are noted. No findings to suggest acute hemorrhage, acute infarction or space-occupying mass lesion are noted. Vascular: No hyperdense vessel or unexpected calcification. Skull: Normal. Negative for fracture or focal lesion. Sinuses/Orbits: No acute finding. Other: None. IMPRESSION: Chronic atrophic and ischemic changes without acute abnormality. Electronically Signed   By: [name] M.D.   On: 12/30/2018 03:07   Dg Hip Unilat W Or Wo Pelvis 2-3 Views Right  Result Date: 12/30/2018 CLINICAL DATA:  85 year old female with fall and right hip pain. EXAM: DG HIP (WITH OR WITHOUT PELVIS) 2-3V RIGHT COMPARISON:  Pelvic radiograph dated 06/01/2018 FINDINGS: There is a displaced fracture of the right femoral neck with proximal migration of the femoral shaft and impaction. No dislocation. The bones are osteopenic. The soft tissues are unremarkable. IMPRESSION: Displaced and impacted fracture of the right femoral neck. Electronically Signed   By: Anner Crete M.D.   On: 12/30/2018 02:47    Pending Labs Unresulted Labs (From admission, onward)    Start     Ordered   12/30/18 0500  CBC  Tomorrow morning,   R     12/30/18 0447   12/30/18 0447  Magnesium  Add-on,   AD     12/30/18 0447          Vitals/Pain Today's Vitals   12/30/18 0645 12/30/18 0715 12/30/18 0730 12/30/18 0734  BP: (!) 116/53 115/64 (!) 115/50   Pulse:      Resp: 14 19 14    SpO2:      Weight:      PainSc:    0-No pain    Isolation Precautions No active isolations  Medications Medications  HYDROcodone-acetaminophen (NORCO/VICODIN) 5-325 MG per tablet 1-2 tablet (has no administration in time range)  morphine 2 MG/ML injection  0.5 mg (has no administration in time range)  fentaNYL (SUBLIMAZE)  injection 50 mcg (50 mcg Intravenous Given 12/30/18 0400)  potassium chloride SA (KLOR-CON) CR tablet 40 mEq (40 mEq Oral Given 12/30/18 0649)    Mobility non-ambulatory High fall risk   Focused Assessments Cardiac Assessment Handoff:  Cardiac Rhythm: Normal sinus rhythm Lab Results  Component Value Date   CKTOTAL 92 12/02/2010   CKMB 4.1 (H) 12/02/2010   TROPONINI <0.03 06/20/2017   Lab Results  Component Value Date   DDIMER  07/28/2009    0.48        AT THE INHOUSE ESTABLISHED CUTOFF VALUE OF 0.48 ug/mL FEU, THIS ASSAY HAS BEEN DOCUMENTED IN THE LITERATURE TO HAVE A SENSITIVITY AND NEGATIVE PREDICTIVE VALUE OF AT LEAST 98 TO 99%.  THE TEST RESULT SHOULD BE CORRELATED WITH AN ASSESSMENT OF THE CLINICAL PROBABILITY OF DVT / VTE.   Does the Patient currently have chest pain? No     R Recommendations: See Admitting Provider Note  Report given to:   Additional Notes:

## 2018-12-30 NOTE — ED Provider Notes (Signed)
Patient seen/examined in the Emergency Department in conjunction with Advanced Practice Provider [facility] Patient reports she fell at home.  She hit her head, has right hip pain and right elbow pain Exam : awake/alert, no distress, right LE is rotated/shortened.  No elbow deformity Plan: imaging pending at this time.      Ripley Fraise, MD 01/04/19 [phone]

## 2018-12-31 DIAGNOSIS — S72001A Fracture of unspecified part of neck of right femur, initial encounter for closed fracture: Secondary | ICD-10-CM | POA: Diagnosis not present

## 2018-12-31 DIAGNOSIS — I480 Paroxysmal atrial fibrillation: Secondary | ICD-10-CM | POA: Diagnosis not present

## 2018-12-31 LAB — CBC
HCT: 36.1 % (ref 36.0–46.0)
Hemoglobin: 12 g/dL (ref 12.0–15.0)
MCH: 32.2 pg (ref 26.0–34.0)
MCHC: 33.2 g/dL (ref 30.0–36.0)
MCV: 96.8 fL (ref 80.0–100.0)
Platelets: 101 10*3/uL — ABNORMAL LOW (ref 150–400)
RBC: 3.73 MIL/uL — ABNORMAL LOW (ref 3.87–5.11)
RDW: 13.9 % (ref 11.5–15.5)
WBC: 6.9 10*3/uL (ref 4.0–10.5)
nRBC: 0 % (ref 0.0–0.2)

## 2018-12-31 LAB — BASIC METABOLIC PANEL
Anion gap: 9 (ref 5–15)
BUN: 26 mg/dL — ABNORMAL HIGH (ref 8–23)
CO2: 23 mmol/L (ref 22–32)
Calcium: 8.7 mg/dL — ABNORMAL LOW (ref 8.9–10.3)
Chloride: 106 mmol/L (ref 98–111)
Creatinine, Ser: 1.1 mg/dL — ABNORMAL HIGH (ref 0.44–1.00)
GFR calc Af Amer: 52 mL/min — ABNORMAL LOW (ref >60)
GFR calc non Af Amer: 45 mL/min — ABNORMAL LOW (ref >60)
Glucose, Bld: 120 mg/dL — ABNORMAL HIGH (ref 70–99)
Potassium: 3.8 mmol/L (ref 3.5–5.1)
Sodium: 138 mmol/L (ref 135–145)

## 2018-12-31 MED ORDER — DOFETILIDE 125 MCG PO CAPS
125.0000 ug | ORAL_CAPSULE | Freq: Two times a day (BID) | ORAL | Status: DC
  Administered 2018-12-31 – 2019-01-03 (×5): 125 ug via ORAL
  Filled 2018-12-31 (×8): qty 1

## 2018-12-31 MED ORDER — POTASSIUM CHLORIDE CRYS ER 20 MEQ PO TBCR
40.0000 meq | EXTENDED_RELEASE_TABLET | Freq: Once | ORAL | Status: DC
  Filled 2018-12-31: qty 2

## 2018-12-31 MED ORDER — CHLORHEXIDINE GLUCONATE 4 % EX LIQD: 60.0000 mL | Freq: Once | CUTANEOUS | Status: DC

## 2018-12-31 MED ORDER — POVIDONE-IODINE 10 % EX SWAB: 2.0000 "application " | Freq: Once | CUTANEOUS | Status: DC

## 2018-12-31 MED ORDER — ENSURE PRE-SURGERY PO LIQD
296.0000 mL | Freq: Once | ORAL | Status: DC
  Filled 2018-12-31: qty 296

## 2018-12-31 MED ORDER — LORAZEPAM 2 MG/ML IJ SOLN: 0.5000 mg | Freq: Four times a day (QID) | INTRAMUSCULAR | Status: DC | PRN

## 2018-12-31 NOTE — Plan of Care (Signed)
  Problem: Activity: Goal: Risk for activity intolerance will decrease Outcome: Progressing   Problem: Nutrition: Goal: Adequate nutrition will be maintained Outcome: Progressing   Problem: Coping: Goal: Level of anxiety will decrease Outcome: Progressing   Problem: Pain Managment: Goal: General experience of comfort will improve Outcome: Progressing   Problem: Safety: Goal: Ability to remain free from injury will improve Outcome: Progressing   Problem: Safety: Goal: Ability to remain free from injury will improve Outcome: Progressing   Problem: Skin Integrity: Goal: Risk for impaired skin integrity will decrease Outcome: Progressing

## 2018-12-31 NOTE — Progress Notes (Signed)
PROGRESS NOTE    Katrina Rodriguez  [id number] DOB: [birth year] DOA: 12/30/2018 PCP: Leighton Ruff, MD    Brief Narrative:  85 year old female with history of paroxysmal atrial fibrillation on apixaban, complete heart block status post PPM, stroke with left hemiparesis, hypothyroidism, thrombocytopenia, orthostatic hypotension presenting after mechanical fall resulting in right hip pain.  The patient has had gait instability since her previous stroke.  She lost her balance and fell onto her right side.  In the ED, x-ray showed impacted right femoral neck fracture.  [facility] was consulted to assist with management.  Cardiology was consulted for cardiac clearance.  Assessment & Plan:   Principal Problem:   Hip fracture (HCC) Active Problems:   PAROXYSMAL ATRIAL FIBRILLATION   BRADYCARDIA-TACHYCARDIA SYNDROME   Complete heart block (HCC)   QT prolongation   Thrombocytopenia (HCC)  Right hip fracture secondary to mechanical fall -X-ray of right hip and pelvis showing displaced and impacted fracture of the right femoral neck. -Orthopedic Surgery consulted. Plan for surgery 10/28 -Pain management: Morphine as needed, Norco as needed -Nonweightbearing at this time -Cardiology consulted for cardiac clearance, cleared for surgery from Cardiology standpoint  Paroxysmal atrial fibrillation, history of ablation -Holding Eliquis in anticipation of surgery -Currently on Tikosyn, last doseat home was 10/25. -Prior EKG from 12/13/2018 with QTC 491. QTc now slightly worsenedat 511.  -Decision regarding resuming this medication per cardiology  QT prolongation on EKG -Takes Tikosyn at home. -cardiology consulted -Also takes Seroquel,will hold at this time. -Keep potassium above 4 and magnesium above 2 -Cardiac monitoring  Chronic mild thrombocytopenia Platelet count 126,000 -chronic dating back to 2010 -Continue to monitor  DVT prophylaxis: Eliquis, SCD's Code Status:  DNR Family Communication: Pt in room, family not at bedside Disposition Plan: Uncertain at this time  Consultants:   Orthopedic Surgery  Cardiology  Procedures:     Antimicrobials: Anti-infectives (From admission, onward)   Start     Dose/Rate Route Frequency Ordered Stop   12/30/18 1115  clindamycin (CLEOCIN) IVPB 900 mg     900 mg 100 mL/hr over 30 Minutes Intravenous On call to O.R. 12/30/18 1105 12/31/18 0559       Subjective: Without complaints this AM  Objective: Vitals:   12/30/18 1932 12/30/18 2316 12/31/18 0339 12/31/18 1512  BP: (!) 131/58 120/68 (!) 116/53 (!) 136/50  Pulse: (!) 54 60 (!) 52 63  Resp: 15 14 15 17   Temp: 98.9 F (37.2 C) 98.6 F (37 C) 98.3 F (36.8 C) 98.3 F (36.8 C)  TempSrc:   Oral Oral  SpO2: 94% 94%  97%  Weight:      Height:        Intake/Output Summary (Last 24 hours) at 12/31/2018 1558 Last data filed at 12/31/2018 1100 Gross per 24 hour  Intake 120 ml  Output 150 ml  Net -30 ml   Filed Weights   12/30/18 0152 12/30/18 1131  Weight: 50.8 kg 50.8 kg    Examination:  General exam: Appears calm and comfortable  Respiratory system: Clear to auscultation. Respiratory effort normal. Cardiovascular system: S1 & S2 heard, RRR. Gastrointestinal system: Abdomen is nondistended, soft and nontender. No organomegaly or masses felt. Normal bowel sounds heard. Central nervous system: Alert and oriented. No focal neurological deficits. Extremities: Symmetric 5 x 5 power. Skin: No rashes, lesions Psychiatry: Judgement and insight appear normal. Mood & affect appropriate.   Data Reviewed: I have personally reviewed following labs and imaging studies  CBC: Recent Labs  Lab 12/30/18 0220  12/30/18 0840 12/31/18 0349  WBC 4.3 8.0 6.9  NEUTROABS 3.0  --   --   HGB 12.3 11.9* 12.0  HCT 37.4 36.4 36.1  MCV 97.9 97.1 96.8  PLT 126* 109* [id number]*   Basic Metabolic Panel: Recent Labs  Lab 12/30/18 0220 12/30/18 1100 12/31/18  0349  NA 139  --  138  K 3.7  --  3.8  CL 105  --  106  CO2 24  --  23  GLUCOSE 112*  --  120*  BUN 33*  --  26*  CREATININE 1.20*  --  1.10*  CALCIUM 8.6*  --  8.7*  MG  --  2.2  --    GFR: Estimated Creatinine Clearance: 28.4 mL/min (A) (by C-G formula based on SCr of 1.1 mg/dL (H)). Liver Function Tests: No results for input(s): AST, ALT, ALKPHOS, BILITOT, PROT, ALBUMIN in the last 168 hours. No results for input(s): LIPASE, AMYLASE in the last 168 hours. No results for input(s): AMMONIA in the last 168 hours. Coagulation Profile: No results for input(s): INR, PROTIME in the last 168 hours. Cardiac Enzymes: No results for input(s): CKTOTAL, CKMB, CKMBINDEX, TROPONINI in the last 168 hours. BNP (last 3 results) No results for input(s): PROBNP in the last 8760 hours. HbA1C: No results for input(s): HGBA1C in the last 72 hours. CBG: Recent Labs  Lab 12/30/18 0525  GLUCAP 111*   Lipid Profile: No results for input(s): CHOL, HDL, LDLCALC, TRIG, CHOLHDL, LDLDIRECT in the last 72 hours. Thyroid Function Tests: No results for input(s): TSH, T4TOTAL, FREET4, T3FREE, THYROIDAB in the last 72 hours. Anemia Panel: No results for input(s): VITAMINB12, FOLATE, FERRITIN, TIBC, IRON, RETICCTPCT in the last 72 hours. Sepsis Labs: No results for input(s): PROCALCITON, LATICACIDVEN in the last 168 hours.  Recent Results (from the past 240 hour(s))  SARS CORONAVIRUS 2 (TAT 6-24 HRS) Nasopharyngeal Nasopharyngeal Swab     Status: None   Collection Time: 12/30/18  2:05 AM   Specimen: Nasopharyngeal Swab  Result Value Ref Range Status   SARS Coronavirus 2 NEGATIVE NEGATIVE Final    Comment: (NOTE) SARS-CoV-2 target nucleic acids are NOT DETECTED. The SARS-CoV-2 RNA is generally detectable in upper and lower respiratory specimens during the acute phase of infection. Negative results do not preclude SARS-CoV-2 infection, do not rule out co-infections with other pathogens, and should not  be used as the sole basis for treatment or other patient management decisions. Negative results must be combined with clinical observations, patient history, and epidemiological information. The expected result is Negative. Fact Sheet for Patients: SugarRoll.be Fact Sheet for Healthcare Providers: https://www.woods-mathews.com/ This test is not yet approved or cleared by the [place] FDA and  has been authorized for detection and/or diagnosis of SARS-CoV-2 by FDA under an Emergency Use Authorization (EUA). This EUA will remain  in effect (meaning this test can be used) for the duration of the COVID-19 declaration under Section 56 4(b)(1) of the Act, 21 U.S.C. section 360bbb-3(b)(1), unless the authorization is terminated or revoked sooner. Performed at [facility], [place] [address], [place], [place] [address]   Surgical PCR screen     Status: None   Collection Time: 12/30/18 12:46 PM   Specimen: Nasal Mucosa; Nasal Swab  Result Value Ref Range Status   MRSA, PCR NEGATIVE NEGATIVE Final   Staphylococcus aureus NEGATIVE NEGATIVE Final    Comment: (NOTE) The Xpert SA Assay (FDA approved for NASAL specimens in patients 33 years of age and older), is  one component of a comprehensive surveillance program. It is not intended to diagnose infection nor to guide or monitor treatment. Performed at [facility], [place] [address] [place], [place] [address]      Radiology Studies: Dg Chest 1 View  Result Date: 12/30/2018 CLINICAL DATA:  85 year old female with right hip fracture. EXAM: CHEST  1 VIEW COMPARISON:  Chest radiograph dated 12/03/2018 FINDINGS: Evaluation is limited due to patient's positioning. Mild eventration of the right hemidiaphragm. No focal consolidation, pleural effusion, or pneumothorax. There is mild cardiomegaly. Left pectoral pacemaker device. Atherosclerotic calcification of the aorta. Osteopenia with  degenerative changes of the spine. IMPRESSION: 1. No acute cardiopulmonary process. 2. Mild cardiomegaly. Electronically Signed   By: Anner Crete M.D.   On: 12/30/2018 02:48   Dg Elbow Complete Right  Result Date: 12/30/2018 CLINICAL DATA:  85 year old female with fall and right elbow pain. EXAM: RIGHT ELBOW - COMPLETE 3+ VIEW COMPARISON:  None. FINDINGS: No acute fracture or dislocation. The bones are osteopenic. No significant arthritic changes. No joint effusion. The soft tissues are unremarkable. IMPRESSION: No acute fracture or dislocation. Electronically Signed   By: Anner Crete M.D.   On: 12/30/2018 02:50   Dg Knee 1-2 Views Right  Result Date: 12/30/2018 CLINICAL DATA:  Right hip fracture. EXAM: RIGHT KNEE - 1-2 VIEW COMPARISON:  None. FINDINGS: No evidence of fracture, dislocation, or joint effusion. No evidence of arthropathy or other focal bone abnormality. Soft tissues are unremarkable. IMPRESSION: Negative. Electronically Signed   By: Marijo Conception M.D.   On: 12/30/2018 10:29   Ct Head Wo Contrast  Result Date: 12/30/2018 CLINICAL DATA:  Recent fall EXAM: CT HEAD WITHOUT CONTRAST TECHNIQUE: Contiguous axial images were obtained from the base of the skull through the vertex without intravenous contrast. COMPARISON:  12/13/2018 FINDINGS: Brain: Chronic atrophic and ischemic changes are noted. No findings to suggest acute hemorrhage, acute infarction or space-occupying mass lesion are noted. Vascular: No hyperdense vessel or unexpected calcification. Skull: Normal. Negative for fracture or focal lesion. Sinuses/Orbits: No acute finding. Other: None. IMPRESSION: Chronic atrophic and ischemic changes without acute abnormality. Electronically Signed   By: [name] M.D.   On: 12/30/2018 03:07   Dg Hip Unilat W Or Wo Pelvis 2-3 Views Right  Result Date: 12/30/2018 CLINICAL DATA:  85 year old female with fall and right hip pain. EXAM: DG HIP (WITH OR WITHOUT PELVIS) 2-3V RIGHT  COMPARISON:  Pelvic radiograph dated 06/01/2018 FINDINGS: There is a displaced fracture of the right femoral neck with proximal migration of the femoral shaft and impaction. No dislocation. The bones are osteopenic. The soft tissues are unremarkable. IMPRESSION: Displaced and impacted fracture of the right femoral neck. Electronically Signed   By: Anner Crete M.D.   On: 12/30/2018 02:47    Scheduled Meds: . chlorhexidine  60 mL Topical Once  . chlorhexidine  60 mL Topical Once  . dofetilide  125 mcg Oral BID  . feeding supplement  296 mL Oral Once  . feeding supplement  296 mL Oral Once  . mupirocin ointment  1 application Nasal BID  . potassium chloride  40 mEq Oral Daily  . potassium chloride  40 mEq Oral Once  . povidone-iodine  2 application Topical Once  . povidone-iodine  2 application Topical Once  . povidone-iodine  2 application Topical Once   Continuous Infusions:   LOS: 1 day   Marylu [place], MD [facility] Pager On Amion  If 7PM-7AM, please contact night-coverage 12/31/2018,  3:58 PM

## 2018-12-31 NOTE — Plan of Care (Signed)
  Problem: Safety: Goal: Ability to remain free from injury will improve Outcome: Progressing   

## 2018-12-31 NOTE — Progress Notes (Signed)
Pt has become increasingly agitated and confused throughout the day. Attempted to give medications multiple times, but pt refused. Dr. Wyline Copas notified. Pt's daughter has now arrived to the unit and the pt seems more relaxed.

## 2019-01-01 ENCOUNTER — Inpatient Hospital Stay (HOSPITAL_COMMUNITY): Payer: Medicare Other | Admitting: Anesthesiology

## 2019-01-01 ENCOUNTER — Encounter (HOSPITAL_COMMUNITY)
Admission: EM | Disposition: A | Discharge status: Skilled Nursing Facility | Payer: Self-pay | Source: Home / Self Care | Attending: Internal Medicine

## 2019-01-01 ENCOUNTER — Inpatient Hospital Stay (HOSPITAL_COMMUNITY): Payer: Medicare Other

## 2019-01-01 ENCOUNTER — Encounter (HOSPITAL_COMMUNITY): Payer: Self-pay | Admitting: Orthopedic Surgery

## 2019-01-01 DIAGNOSIS — S72001A Fracture of unspecified part of neck of right femur, initial encounter for closed fracture: Secondary | ICD-10-CM | POA: Diagnosis not present

## 2019-01-01 DIAGNOSIS — I442 Atrioventricular block, complete: Secondary | ICD-10-CM | POA: Diagnosis not present

## 2019-01-01 DIAGNOSIS — D696 Thrombocytopenia, unspecified: Secondary | ICD-10-CM | POA: Diagnosis not present

## 2019-01-01 DIAGNOSIS — I480 Paroxysmal atrial fibrillation: Secondary | ICD-10-CM | POA: Diagnosis not present

## 2019-01-01 HISTORY — PX: HIP ARTHROPLASTY: SHX981

## 2019-01-01 LAB — CBC
HCT: 43.3 % (ref 36.0–46.0)
Hemoglobin: 14.1 g/dL (ref 12.0–15.0)
MCH: 31.9 pg (ref 26.0–34.0)
MCHC: 32.6 g/dL (ref 30.0–36.0)
MCV: 98 fL (ref 80.0–100.0)
Platelets: 102 10*3/uL — ABNORMAL LOW (ref 150–400)
RBC: 4.42 MIL/uL (ref 3.87–5.11)
RDW: 13.7 % (ref 11.5–15.5)
WBC: 7.9 10*3/uL (ref 4.0–10.5)
nRBC: 0 % (ref 0.0–0.2)

## 2019-01-01 LAB — COMPREHENSIVE METABOLIC PANEL
ALT: 22 U/L (ref 0–44)
AST: 37 U/L (ref 15–41)
Albumin: 3.6 g/dL (ref 3.5–5.0)
Alkaline Phosphatase: 74 U/L (ref 38–126)
Anion gap: 11 (ref 5–15)
BUN: 25 mg/dL — ABNORMAL HIGH (ref 8–23)
CO2: 24 mmol/L (ref 22–32)
Calcium: 8.6 mg/dL — ABNORMAL LOW (ref 8.9–10.3)
Chloride: 104 mmol/L (ref 98–111)
Creatinine, Ser: 1.07 mg/dL — ABNORMAL HIGH (ref 0.44–1.00)
GFR calc Af Amer: 54 mL/min — ABNORMAL LOW (ref >60)
GFR calc non Af Amer: 46 mL/min — ABNORMAL LOW (ref >60)
Glucose, Bld: 105 mg/dL — ABNORMAL HIGH (ref 70–99)
Potassium: 3.4 mmol/L — ABNORMAL LOW (ref 3.5–5.1)
Sodium: 139 mmol/L (ref 135–145)
Total Bilirubin: 1.2 mg/dL (ref 0.3–1.2)
Total Protein: 6.4 g/dL — ABNORMAL LOW (ref 6.5–8.1)

## 2019-01-01 LAB — GLUCOSE, CAPILLARY: Glucose-Capillary: 120 mg/dL — ABNORMAL HIGH (ref 70–99)

## 2019-01-01 SURGERY — HEMIARTHROPLASTY, HIP, DIRECT ANTERIOR APPROACH, FOR FRACTURE
Anesthesia: General | Site: Hip | Laterality: Right

## 2019-01-01 MED ORDER — ROCURONIUM BROMIDE 10 MG/ML (PF) SYRINGE
PREFILLED_SYRINGE | INTRAVENOUS | Status: AC
  Filled 2019-01-01: qty 10

## 2019-01-01 MED ORDER — TRANEXAMIC ACID-NACL 1000-0.7 MG/100ML-% IV SOLN
1000.0000 mg | INTRAVENOUS | Status: AC
  Administered 2019-01-01: 1000 mg via INTRAVENOUS

## 2019-01-01 MED ORDER — LIDOCAINE 2% (20 MG/ML) 5 ML SYRINGE
INTRAMUSCULAR | Status: AC
  Filled 2019-01-01: qty 5

## 2019-01-01 MED ORDER — FENTANYL CITRATE (PF) 250 MCG/5ML IJ SOLN
INTRAMUSCULAR | Status: DC | PRN
  Administered 2019-01-01 (×3): 50 ug via INTRAVENOUS

## 2019-01-01 MED ORDER — CLINDAMYCIN PHOSPHATE 900 MG/50ML IV SOLN
900.0000 mg | INTRAVENOUS | Status: AC
  Administered 2019-01-01: 900 mg via INTRAVENOUS

## 2019-01-01 MED ORDER — SODIUM CHLORIDE (PF) 0.9 % IJ SOLN
INTRAMUSCULAR | Status: DC | PRN
  Administered 2019-01-01: 30 mL

## 2019-01-01 MED ORDER — ONDANSETRON HCL 4 MG/2ML IJ SOLN
INTRAMUSCULAR | Status: DC | PRN
  Administered 2019-01-01: 4 mg via INTRAVENOUS

## 2019-01-01 MED ORDER — APIXABAN 2.5 MG PO TABS
2.5000 mg | ORAL_TABLET | Freq: Two times a day (BID) | ORAL | Status: DC
  Administered 2019-01-02 – 2019-01-03 (×3): 2.5 mg via ORAL
  Filled 2019-01-01 (×3): qty 1

## 2019-01-01 MED ORDER — ONDANSETRON HCL 4 MG/2ML IJ SOLN
INTRAMUSCULAR | Status: AC
  Filled 2019-01-01: qty 2

## 2019-01-01 MED ORDER — PROPOFOL 10 MG/ML IV BOLUS
INTRAVENOUS | Status: AC
  Filled 2019-01-01: qty 20

## 2019-01-01 MED ORDER — SUGAMMADEX SODIUM 200 MG/2ML IV SOLN
INTRAVENOUS | Status: DC | PRN
  Administered 2019-01-01: 300 mg via INTRAVENOUS

## 2019-01-01 MED ORDER — ROCURONIUM BROMIDE 10 MG/ML (PF) SYRINGE
PREFILLED_SYRINGE | INTRAVENOUS | Status: DC | PRN
  Administered 2019-01-01: 50 mg via INTRAVENOUS
  Administered 2019-01-01: 20 mg via INTRAVENOUS

## 2019-01-01 MED ORDER — LIDOCAINE 2% (20 MG/ML) 5 ML SYRINGE
INTRAMUSCULAR | Status: DC | PRN
  Administered 2019-01-01: 100 mg via INTRAVENOUS

## 2019-01-01 MED ORDER — FENTANYL CITRATE (PF) 250 MCG/5ML IJ SOLN
INTRAMUSCULAR | Status: AC
  Filled 2019-01-01: qty 5

## 2019-01-01 MED ORDER — 0.9 % SODIUM CHLORIDE (POUR BTL) OPTIME
TOPICAL | Status: DC | PRN
  Administered 2019-01-01: 1000 mL

## 2019-01-01 MED ORDER — MENTHOL 3 MG MT LOZG: 1.0000 | LOZENGE | OROMUCOSAL | Status: DC | PRN

## 2019-01-01 MED ORDER — KETOROLAC TROMETHAMINE 30 MG/ML IJ SOLN
INTRAMUSCULAR | Status: DC | PRN
  Administered 2019-01-01: 30 mg

## 2019-01-01 MED ORDER — PHENOL 1.4 % MT LIQD: 1.0000 | OROMUCOSAL | Status: DC | PRN

## 2019-01-01 MED ORDER — DOCUSATE SODIUM 100 MG PO CAPS
100.0000 mg | ORAL_CAPSULE | Freq: Two times a day (BID) | ORAL | Status: DC
  Administered 2019-01-02 – 2019-01-03 (×3): 100 mg via ORAL
  Filled 2019-01-01 (×4): qty 1

## 2019-01-01 MED ORDER — BUPIVACAINE-EPINEPHRINE (PF) 0.5% -1:200000 IJ SOLN
INTRAMUSCULAR | Status: DC | PRN
  Administered 2019-01-01: 30 mL

## 2019-01-01 MED ORDER — EPHEDRINE SULFATE-NACL 50-0.9 MG/10ML-% IV SOSY
PREFILLED_SYRINGE | INTRAVENOUS | Status: DC | PRN
  Administered 2019-01-01: 15 mg via INTRAVENOUS

## 2019-01-01 MED ORDER — PROPOFOL 10 MG/ML IV BOLUS
INTRAVENOUS | Status: DC | PRN
  Administered 2019-01-01: 60 mg via INTRAVENOUS

## 2019-01-01 MED ORDER — CLINDAMYCIN PHOSPHATE 600 MG/50ML IV SOLN
600.0000 mg | Freq: Four times a day (QID) | INTRAVENOUS | Status: AC
  Administered 2019-01-02 (×2): 600 mg via INTRAVENOUS
  Filled 2019-01-01 (×2): qty 50

## 2019-01-01 MED ORDER — SODIUM CHLORIDE 0.9 % IR SOLN
Status: DC | PRN
  Administered 2019-01-01: 100 mL

## 2019-01-01 MED ORDER — CLINDAMYCIN PHOSPHATE 900 MG/50ML IV SOLN
INTRAVENOUS | Status: AC
  Filled 2019-01-01: qty 50

## 2019-01-01 MED ORDER — TRANEXAMIC ACID-NACL 1000-0.7 MG/100ML-% IV SOLN
INTRAVENOUS | Status: AC
  Filled 2019-01-01: qty 100

## 2019-01-01 MED ORDER — KETOROLAC TROMETHAMINE 30 MG/ML IJ SOLN
INTRAMUSCULAR | Status: AC
  Filled 2019-01-01: qty 1

## 2019-01-01 MED ORDER — ONDANSETRON HCL 4 MG/2ML IJ SOLN: 4.0000 mg | Freq: Four times a day (QID) | INTRAMUSCULAR | Status: DC | PRN

## 2019-01-01 MED ORDER — DEXAMETHASONE SODIUM PHOSPHATE 10 MG/ML IJ SOLN
INTRAMUSCULAR | Status: AC
  Filled 2019-01-01: qty 1

## 2019-01-01 MED ORDER — LACTATED RINGERS IV SOLN
INTRAVENOUS | Status: DC
  Administered 2019-01-01: 16:00:00 via INTRAVENOUS

## 2019-01-01 MED ORDER — FENTANYL CITRATE (PF) 100 MCG/2ML IJ SOLN: 25.0000 ug | INTRAMUSCULAR | Status: DC | PRN

## 2019-01-01 MED ORDER — ONDANSETRON HCL 4 MG PO TABS: 4.0000 mg | ORAL_TABLET | Freq: Four times a day (QID) | ORAL | Status: DC | PRN

## 2019-01-01 MED ORDER — SODIUM CHLORIDE 0.9 % IR SOLN
Status: DC | PRN
  Administered 2019-01-01: 3000 mL

## 2019-01-01 MED ORDER — BUPIVACAINE-EPINEPHRINE 0.5% -1:200000 IJ SOLN
INTRAMUSCULAR | Status: AC
  Filled 2019-01-01: qty 1

## 2019-01-01 SURGICAL SUPPLY — 61 items
ADH SKN CLS APL DERMABOND .7 (GAUZE/BANDAGES/DRESSINGS) ×2
APL PRP STRL LF DISP 70% ISPRP (MISCELLANEOUS) ×1
BLADE CLIPPER SURG (BLADE) IMPLANT
BLADE SAW SGTL 18X1.27X75 (BLADE) ×2 IMPLANT
BLADE SAW SGTL 18X1.27X75MM (BLADE) ×1
CHLORAPREP W/TINT 26 (MISCELLANEOUS) ×3 IMPLANT
COVER SURGICAL LIGHT HANDLE (MISCELLANEOUS) ×3 IMPLANT
COVER WAND RF STERILE (DRAPES) ×3 IMPLANT
DERMABOND ADVANCED (GAUZE/BANDAGES/DRESSINGS) ×4
DERMABOND ADVANCED .7 DNX12 (GAUZE/BANDAGES/DRESSINGS) ×2 IMPLANT
DRAPE C-ARM 42X72 X-RAY (DRAPES) ×3 IMPLANT
DRAPE IMP U-DRAPE 54X76 (DRAPES) ×6 IMPLANT
DRAPE STERI IOBAN 125X83 (DRAPES) ×3 IMPLANT
DRAPE U-SHAPE 47X51 STRL (DRAPES) ×9 IMPLANT
DRSG AQUACEL AG ADV 3.5X10 (GAUZE/BANDAGES/DRESSINGS) ×3 IMPLANT
ELECT BLADE 4.0 EZ CLEAN MEGAD (MISCELLANEOUS) ×3
ELECT REM PT RETURN 9FT ADLT (ELECTROSURGICAL) ×3
ELECTRODE BLDE 4.0 EZ CLN MEGD (MISCELLANEOUS) ×1 IMPLANT
ELECTRODE REM PT RTRN 9FT ADLT (ELECTROSURGICAL) ×1 IMPLANT
EVACUATOR 1/8 PVC DRAIN (DRAIN) IMPLANT
GLOVE BIO SURGEON STRL SZ8.5 (GLOVE) ×6 IMPLANT
GLOVE BIOGEL PI IND STRL 8.5 (GLOVE) ×1 IMPLANT
GLOVE BIOGEL PI INDICATOR 8.5 (GLOVE) ×2
GOWN STRL REUS W/ TWL LRG LVL3 (GOWN DISPOSABLE) ×2 IMPLANT
GOWN STRL REUS W/TWL 2XL LVL3 (GOWN DISPOSABLE) ×3 IMPLANT
GOWN STRL REUS W/TWL LRG LVL3 (GOWN DISPOSABLE) ×6
HANDPIECE INTERPULSE COAX TIP (DISPOSABLE) ×3
HEAD FEM UNIPOLAR 46 OD STRL (Hips) ×2 IMPLANT
HOOD PEEL AWAY FACE SHEILD DIS (HOOD) ×6 IMPLANT
KIT BASIN OR (CUSTOM PROCEDURE TRAY) ×3 IMPLANT
KIT TURNOVER KIT B (KITS) ×3 IMPLANT
MANIFOLD NEPTUNE II (INSTRUMENTS) ×3 IMPLANT
MARKER SKIN DUAL TIP RULER LAB (MISCELLANEOUS) ×3 IMPLANT
NDL SPNL 18GX3.5 QUINCKE PK (NEEDLE) ×1 IMPLANT
NEEDLE SPNL 18GX3.5 QUINCKE PK (NEEDLE) ×3 IMPLANT
NS IRRIG 1000ML POUR BTL (IV SOLUTION) ×3 IMPLANT
PACK TOTAL JOINT (CUSTOM PROCEDURE TRAY) ×3 IMPLANT
PACK UNIVERSAL I (CUSTOM PROCEDURE TRAY) ×3 IMPLANT
PAD ARMBOARD 7.5X6 YLW CONV (MISCELLANEOUS) ×6 IMPLANT
SEALER BIPOLAR AQUA 6.0 (INSTRUMENTS) IMPLANT
SET HNDPC FAN SPRY TIP SCT (DISPOSABLE) ×1 IMPLANT
SPACER FEM TAPERED +0 12/14 (Hips) ×2 IMPLANT
STEM TRI LOC BPS SZ4 W GRIPTON (Hips) IMPLANT
SUCTION FRAZIER HANDLE 10FR (MISCELLANEOUS) ×2
SUCTION TUBE FRAZIER 10FR DISP (MISCELLANEOUS) ×1 IMPLANT
SUT ETHIBOND NAB CT1 #1 30IN (SUTURE) ×10 IMPLANT
SUT MNCRL AB 3-0 PS2 18 (SUTURE) ×5 IMPLANT
SUT MON AB 2-0 CT1 36 (SUTURE) ×5 IMPLANT
SUT VIC AB 1 CT1 27 (SUTURE) ×3
SUT VIC AB 1 CT1 27XBRD ANBCTR (SUTURE) ×1 IMPLANT
SUT VIC AB 2-0 CT1 27 (SUTURE) ×6
SUT VIC AB 2-0 CT1 TAPERPNT 27 (SUTURE) ×1 IMPLANT
SUT VLOC 180 0 24IN GS25 (SUTURE) ×3 IMPLANT
SUT VLOC 180 0 6IN GS21 (SUTURE) ×2 IMPLANT
SYR 50ML LL SCALE MARK (SYRINGE) ×3 IMPLANT
TOWEL GREEN STERILE (TOWEL DISPOSABLE) ×3 IMPLANT
TOWEL GREEN STERILE FF (TOWEL DISPOSABLE) ×3 IMPLANT
TRAY FOLEY W/BAG SLVR 16FR (SET/KITS/TRAYS/PACK)
TRAY FOLEY W/BAG SLVR 16FR ST (SET/KITS/TRAYS/PACK) IMPLANT
TRI LOC BPS SZ 4 W GRIPTON (Hips) ×3 IMPLANT
WATER STERILE IRR 1000ML POUR (IV SOLUTION) ×9 IMPLANT

## 2019-01-01 NOTE — Progress Notes (Signed)
PROGRESS NOTE    Katrina Rodriguez  [id number] DOB: 04/13/1928 DOA: 12/30/2018 PCP: Leighton Ruff, MD    Brief Narrative:  85 year old female with history of paroxysmal atrial fibrillation on apixaban, tikosyn, complete heart block status post PPM, stroke with left hemiparesis, hypothyroidism, thrombocytopenia, orthostatic hypotension presented after mechanical fall resulting in right hip pain.  The patient has had gait instability since her previous stroke.  She lost her balance and fell onto her right side.  In the ED, x-ray showed impacted right femoral neck fracture.  Ortho was consulted to assist with management.  Cardiology was consulted for cardiac clearance.  Assessment & Plan:   Right hip fracture secondary to mechanical fall -X-ray of right hip and pelvis showing displaced and impacted fracture of the right femoral neck. -Orthopedic Surgery consulted. Plan for surgery today -Pain control, add DVT prophylaxis postop, seen by cardiology for prolonged QTC, this is felt to be stable, recommended to continue Tikosyn  Paroxysmal atrial fibrillation, history of ablation, prolonged QTC -Holding Eliquis, resume postop -Currently on Tikosyn, QTC prolonged at 511 but felt to be stable, seen by cardiology recommended to continue Tikosyn at current dose -Caution with other QT prolonging meds, Seroquel held -Continue KCl daily, with a goal to keep potassium around 4, continue telemetry monitoring  Chronic mild thrombocytopenia Platelet count 126,000 -chronic dating back to 2010 -Continue to monitor  DVT prophylaxis: Eliquis, SCD's Code Status: DNR Family Communication: Pt in room, family not at bedside Disposition Plan: To be determined, will likely need rehab postop, will need PT eval after surgery to determine this  Consultants:   Orthopedic Surgery  Cardiology  Procedures:     Antimicrobials: Anti-infectives (From admission, onward)   Start     Dose/Rate Route  Frequency Ordered Stop   12/30/18 1115  clindamycin (CLEOCIN) IVPB 900 mg     900 mg 100 mL/hr over 30 Minutes Intravenous On call to O.R. 12/30/18 1105 12/31/18 0559      Subjective: -Pleasant, extremely hard of hearing, mild confusion this morning  Objective: Vitals:   12/31/18 1512 12/31/18 2024 01/01/19 0314 01/01/19 0821  BP: (!) 136/50 (!) 112/47 139/61 120/73  Pulse: 63 (!) 59 73 88  Resp: 17 20 18 16   Temp: 98.3 F (36.8 C) (!) 97.5 F (36.4 C) (!) 97.4 F (36.3 C) 97.7 F (36.5 C)  TempSrc: Oral Oral Oral Axillary  SpO2: 97% 92% 95% 97%  Weight:      Height:        Intake/Output Summary (Last 24 hours) at 01/01/2019 1220 Last data filed at 01/01/2019 [id number] Gross per 24 hour  Intake 0 ml  Output 200 ml  Net -200 ml   Filed Weights   12/30/18 0152 12/30/18 1131  Weight: 50.8 kg 50.8 kg    Examination:  General exam: Awake alert oriented to self, partly to time only Respiratory system: Clear to auscultation. Respiratory effort normal. Cardiovascular system: S1 & S2 heard, RRR. Gastrointestinal system: Abdomen is nondistended, soft and nontender. No organomegaly or masses felt. Normal bowel sounds heard. Central nervous system: Alert and alert, only partly oriented Extremities: No edema, right hip shortened and externally rotated Skin: No rashes, lesions Psychiatry: Poor insight and judgment.   Data Reviewed: I have personally reviewed following labs and imaging studies  CBC: Recent Labs  Lab 12/30/18 0220 12/30/18 0840 12/31/18 0349 01/01/19 0238  WBC 4.3 8.0 6.9 7.9  NEUTROABS 3.0  --   --   --   HGB 12.3 11.9*  12.0 14.1  HCT 37.4 36.4 36.1 43.3  MCV 97.9 97.1 96.8 98.0  PLT 126* 109* 101* [id number]*   Basic Metabolic Panel: Recent Labs  Lab 12/30/18 0220 12/30/18 1100 12/31/18 0349 01/01/19 0238  NA 139  --  138 139  K 3.7  --  3.8 3.4*  CL 105  --  106 104  CO2 24  --  23 24  GLUCOSE 112*  --  120* 105*  BUN 33*  --  26* 25*  CREATININE  1.20*  --  1.10* 1.07*  CALCIUM 8.6*  --  8.7* 8.6*  MG  --  2.2  --   --    GFR: Estimated Creatinine Clearance: 29.1 mL/min (A) (by C-G formula based on SCr of 1.07 mg/dL (H)). Liver Function Tests: Recent Labs  Lab 01/01/19 0238  AST 37  ALT 22  ALKPHOS 74  BILITOT 1.2  PROT 6.4*  ALBUMIN 3.6   No results for input(s): LIPASE, AMYLASE in the last 168 hours. No results for input(s): AMMONIA in the last 168 hours. Coagulation Profile: No results for input(s): INR, PROTIME in the last 168 hours. Cardiac Enzymes: No results for input(s): CKTOTAL, CKMB, CKMBINDEX, TROPONINI in the last 168 hours. BNP (last 3 results) No results for input(s): PROBNP in the last 8760 hours. HbA1C: No results for input(s): HGBA1C in the last 72 hours. CBG: Recent Labs  Lab 12/30/18 0525  GLUCAP 111*   Lipid Profile: No results for input(s): CHOL, HDL, LDLCALC, TRIG, CHOLHDL, LDLDIRECT in the last 72 hours. Thyroid Function Tests: No results for input(s): TSH, T4TOTAL, FREET4, T3FREE, THYROIDAB in the last 72 hours. Anemia Panel: No results for input(s): VITAMINB12, FOLATE, FERRITIN, TIBC, IRON, RETICCTPCT in the last 72 hours. Sepsis Labs: No results for input(s): PROCALCITON, LATICACIDVEN in the last 168 hours.  Recent Results (from the past 240 hour(s))  SARS CORONAVIRUS 2 (TAT 6-24 HRS) Nasopharyngeal Nasopharyngeal Swab     Status: None   Collection Time: 12/30/18  2:05 AM   Specimen: Nasopharyngeal Swab  Result Value Ref Range Status   SARS Coronavirus 2 NEGATIVE NEGATIVE Final    Comment: (NOTE) SARS-CoV-2 target nucleic acids are NOT DETECTED. The SARS-CoV-2 RNA is generally detectable in upper and lower respiratory specimens during the acute phase of infection. Negative results do not preclude SARS-CoV-2 infection, do not rule out co-infections with other pathogens, and should not be used as the sole basis for treatment or other patient management decisions. Negative results  must be combined with clinical observations, patient history, and epidemiological information. The expected result is Negative. Fact Sheet for Patients: SugarRoll.be Fact Sheet for Healthcare Providers: https://www.woods-mathews.com/ This test is not yet approved or cleared by the [place] FDA and  has been authorized for detection and/or diagnosis of SARS-CoV-2 by FDA under an Emergency Use Authorization (EUA). This EUA will remain  in effect (meaning this test can be used) for the duration of the COVID-19 declaration under Section 56 4(b)(1) of the Act, 21 U.S.C. section 360bbb-3(b)(1), unless the authorization is terminated or revoked sooner. Performed at [facility], [place] [address] [place], [place] [address]   Surgical PCR screen     Status: None   Collection Time: 12/30/18 12:46 PM   Specimen: Nasal Mucosa; Nasal Swab  Result Value Ref Range Status   MRSA, PCR NEGATIVE NEGATIVE Final   Staphylococcus aureus NEGATIVE NEGATIVE Final    Comment: (NOTE) The Xpert SA Assay (FDA approved for NASAL specimens in patients 22  years of age and older), is one component of a comprehensive surveillance program. It is not intended to diagnose infection nor to guide or monitor treatment. Performed at [facility], [place] [address] [place], [place] [address]      Radiology Studies: No results found.  Scheduled Meds: . chlorhexidine  60 mL Topical Once  . chlorhexidine  60 mL Topical Once  . dofetilide  125 mcg Oral BID  . feeding supplement  296 mL Oral Once  . feeding supplement  296 mL Oral Once  . mupirocin ointment  1 application Nasal BID  . potassium chloride  40 mEq Oral Daily  . potassium chloride  40 mEq Oral Once  . povidone-iodine  2 application Topical Once  . povidone-iodine  2 application Topical Once  . povidone-iodine  2 application Topical Once   Continuous Infusions:   LOS: 2 days   Domenic Polite,  MD [facility]  01/01/2019, 12:20 PM

## 2019-01-01 NOTE — Discharge Instructions (Signed)
°Dr. Marlicia Sroka °Joint Replacement Specialist °[facility] °[address] °[place], [place] [address] °[phone] ° ° °TOTAL HIP REPLACEMENT POSTOPERATIVE DIRECTIONS ° ° ° °Hip Rehabilitation, Guidelines Following Surgery  ° °WEIGHT BEARING °Weight bearing as tolerated with assist device (walker, cane, etc) as directed, use it as long as suggested by your surgeon or therapist, typically at least 4-6 weeks. ° °The results of a hip operation are greatly improved after range of motion and muscle strengthening exercises. Follow all safety measures which are given to protect your hip. If any of these exercises cause increased pain or swelling in your joint, decrease the amount until you are comfortable again. Then slowly increase the exercises. Call your caregiver if you have problems or questions.  ° °HOME CARE INSTRUCTIONS  °Most of the following instructions are designed to prevent the dislocation of your new hip.  °Remove items at home which could result in a fall. This includes throw rugs or furniture in walking pathways.  °Continue medications as instructed at time of discharge. °· You may have some home medications which will be placed on hold until you complete the course of blood thinner medication. °· You may start showering once you are discharged home. Do not remove your dressing. °Do not put on socks or shoes without following the instructions of your caregivers.   °Sit on chairs with arms. Use the chair arms to help push yourself up when arising.  °Arrange for the use of a toilet seat elevator so you are not sitting low.  °· Walk with walker as instructed.  °You may resume a sexual relationship in one month or when given the OK by your caregiver.  °Use walker as long as suggested by your caregivers.  °You may put full weight on your legs and walk as much as is comfortable. °Avoid periods of inactivity such as sitting longer than an hour when not asleep. This helps prevent  blood clots.  °You may return to work once you are cleared by your surgeon.  °Do not drive a car for 6 weeks or until released by your surgeon.  °Do not drive while taking narcotics.  °Wear elastic stockings for two weeks following surgery during the day but you may remove then at night.  °Make sure you keep all of your appointments after your operation with all of your doctors and caregivers. You should call the office at the above phone number and make an appointment for approximately two weeks after the date of your surgery. °Please pick up a stool softener and laxative for home use as long as you are requiring pain medications. °· ICE to the affected hip every three hours for 30 minutes at a time and then as needed for pain and swelling. Continue to use ice on the hip for pain and swelling from surgery. You may notice swelling that will progress down to the foot and ankle.  This is normal after surgery.  Elevate the leg when you are not up walking on it.   °It is important for you to complete the blood thinner medication as prescribed by your doctor. °· Continue to use the breathing machine which will help keep your temperature down.  It is common for your temperature to cycle up and down following surgery, especially at night when you are not up moving around and exerting yourself.  The breathing machine keeps your lungs expanded and your temperature down. ° °RANGE OF MOTION AND STRENGTHENING EXERCISES  °These exercises are   designed to help you keep full movement of your hip joint. Follow your caregiver's or physical therapist's instructions. Perform all exercises about fifteen times, three times per day or as directed. Exercise both hips, even if you have had only one joint replacement. These exercises can be done on a training (exercise) mat, on the floor, on a table or on a bed. Use whatever works the best and is most comfortable for you. Use music or television while you are exercising so that the exercises  are a pleasant break in your day. This will make your life better with the exercises acting as a break in routine you can look forward to.  °Lying on your back, slowly slide your foot toward your buttocks, raising your knee up off the floor. Then slowly slide your foot back down until your leg is straight again.  °Lying on your back spread your legs as far apart as you can without causing discomfort.  °Lying on your side, raise your upper leg and foot straight up from the floor as far as is comfortable. Slowly lower the leg and repeat.  °Lying on your back, tighten up the muscle in the front of your thigh (quadriceps muscles). You can do this by keeping your leg straight and trying to raise your heel off the floor. This helps strengthen the largest muscle supporting your knee.  °Lying on your back, tighten up the muscles of your buttocks both with the legs straight and with the knee bent at a comfortable angle while keeping your heel on the floor.  ° °SKILLED REHAB INSTRUCTIONS: °If the patient is transferred to a skilled rehab facility following release from the hospital, a list of the current medications will be sent to the facility for the patient to continue.  When discharged from the skilled rehab facility, please have the facility set up the patient's Home Health Physical Therapy prior to being released. Also, the skilled facility will be responsible for providing the patient with their medications at time of release from the facility to include their pain medication and their blood thinner medication. If the patient is still at the rehab facility at time of the two week follow up appointment, the skilled rehab facility will also need to assist the patient in arranging follow up appointment in our office and any transportation needs. ° °MAKE SURE YOU:  °Understand these instructions.  °Will watch your condition.  °Will get help right away if you are not doing well or get worse. ° °Pick up stool softner and  laxative for home use following surgery while on pain medications. °Do not remove your dressing. °The dressing is waterproof--it is OK to take showers. °Continue to use ice for pain and swelling after surgery. °Do not use any lotions or creams on the incision until instructed by your surgeon. °Total Hip Protocol. ° ° °

## 2019-01-01 NOTE — Op Note (Signed)
OPERATIVE REPORT  SURGEON: Rod Can, MD   ASSISTANT: Ky Barban, RNFA.  PREOPERATIVE DIAGNOSIS: Displaced Right femoral neck fracture.   POSTOPERATIVE DIAGNOSIS: Displaced Right femoral neck fracture.   PROCEDURE: Right hip hemiarthroplasty, anterior approach.   IMPLANTS: DePuy Tri Lock stem, size 4, hi offset, with a 46 mm spacer and a +0 mm monopolar head ball.  ANESTHESIA:  General  ANTIBIOTICS: 900 mg clindamycin (PCN allergy).  ESTIMATED BLOOD LOSS:-100 mL    DRAINS: None.  COMPLICATIONS: None   CONDITION: PACU - hemodynamically stable.   BRIEF CLINICAL NOTE: Katrina Rodriguez is a 85 y.o. female with a displaced Right femoral neck fracture. The patient was admitted to the hospitalist service and underwent perioperative risk stratification and medical optimization. We waited 72 hours due to Eliquis usage. The risks, benefits, and alternatives to hemiarthroplasty were explained, and the patient elected to proceed.  PROCEDURE IN DETAIL: The patient was taken to the operating room and general anesthesia was induced on the hospital bed.  The patient was then positioned on the Hana table.  All bony prominences were well padded.  The hip was prepped and draped in the normal sterile surgical fashion.  A time-out was called verifying side and site of surgery. Antibiotics were given within 60 minutes of beginning the procedure.   The direct anterior approach to the hip was performed through the Hueter interval.  Lateral femoral circumflex vessels were treated with the Auqumantys. The anterior capsule was exposed and an inverted T capsulotomy was made.  Fracture hematoma was encountered and evacuated. The patient was found to have a comminuted Right subcapital femoral neck fracture.  I freshened the femoral neck cut with a saw.  I removed the femoral neck fragment.  A corkscrew was placed into the head and the head was removed.  This was passed to the back table and was measured.   Acetabular exposure was achieved.  I examined the articular cartilage which was intact.  The labrum was intact. A 46 mm trial head was placed and found to have excellent fit.   I then gained femoral exposure taking care to protect the abductors and greater trochanter.  This was performed using standard external rotation, extension, and adduction.  The capsule was peeled off the inner aspect of the greater trochanter, taking care to preserve the short external rotators. A cookie cutter was used to enter the femoral canal, and then the femoral canal finder was used to confirm location.  I then sequentially broached up to a size 4.  Calcar planer was used on the femoral neck remnant.  I paced a hi neck and a 36 + 1.5 head ball. The hip was reduced.  Leg lengths were checked fluoroscopically.  The hip was dislocated and trial components were removed.  I placed the real stem followed by the real spacer and head ball.  A single reduction maneuver was performed and the hip was reduced.  Fluoroscopy was used to confirm component position and leg lengths.  At 90 degrees of external rotation and extension, the hip was stable to an anterior directed force.   The wound was copiously irrigated with Irrisept solution and normal saline using pule lavage.  Marcaine solution was injected into the periarticular soft tissue.  The wound was closed in layers using #1 Vicryl and V-Loc for the fascia, 2-0 Vicryl for the subcutaneous fat, 2-0 Monocryl for the deep dermal layer, 3-0 nylon horizontal mattress sutures for the skin.  An Aquacell Ag dressing was applied.  The patient was then awakened from anesthesia and transported to the recovery room in stable condition.  Sponge, needle, and instrument counts were correct at the end of the case x2.  The patient tolerated the procedure well and there were no known complications.  Please note that a surgical assistant was a medical necessity for this procedure to perform it in a safe  and expeditious manner. Assistant was necessary to provide appropriate retraction of vital neurovascular structures, to prevent femoral fracture, and to allow for anatomic placement of the prosthesis.

## 2019-01-01 NOTE — Progress Notes (Signed)
Patient alert and oriented x1, baseline. Upon entering patient's room, the patient had removed aquacel dressing. Site was clean, dry and intact, no drainage noted. New Aquacel dressing applied.

## 2019-01-01 NOTE — Plan of Care (Signed)
  Problem: Pain Managment: Goal: General experience of comfort will improve Outcome: Progressing   Problem: Safety: Goal: Ability to remain free from injury will improve Outcome: Progressing   Problem: Skin Integrity: Goal: Risk for impaired skin integrity will decrease Outcome: Progressing   

## 2019-01-01 NOTE — Progress Notes (Signed)
Per pharmacy - hold 0800 dose of Tikosyn. Previous dose given at 0509, give next dose at scheduled 2000.

## 2019-01-01 NOTE — Interval H&P Note (Signed)
History and Physical Interval Note:  01/01/2019 5:11 PM  Katrina Rodriguez  has presented today for surgery, with the diagnosis of Right hip fx.  The various methods of treatment have been discussed with the patient and family. After consideration of risks, benefits and other options for treatment, the patient has consented to  Procedure(s): ARTHROPLASTY BIPOLAR HIP (HEMIARTHROPLASTY) (Right) as a surgical intervention.  The patient's history has been reviewed, patient examined, no change in status, stable for surgery.  I have reviewed the patient's chart and labs.  Questions were answered to the patient's satisfaction.     Katrina Rodriguez

## 2019-01-01 NOTE — Transfer of Care (Signed)
Immediate Anesthesia Transfer of Care Note  Patient: Katrina Rodriguez  Procedure(s) Performed: ARTHROPLASTY BIPOLAR HIP (HEMIARTHROPLASTY) (Right Hip)  Patient Location: PACU  Anesthesia Type:General  Level of Consciousness: sedated, drowsy, patient cooperative and responds to stimulation  Airway & Oxygen Therapy: Patient Spontanous Breathing and Patient connected to nasal cannula oxygen  Post-op Assessment: Report given to RN, Post -op Vital signs reviewed and stable and Patient moving all extremities X 4  Post vital signs: Reviewed and stable  Last Vitals:  Vitals Value Taken Time  BP 132/67 01/01/19 1857  Temp    Pulse 87 01/01/19 1859  Resp 12 01/01/19 1859  SpO2 99 % 01/01/19 1859  Vitals shown include unvalidated device data.  Last Pain:  Vitals:   01/01/19 0821  TempSrc: Axillary  PainSc:       Patients Stated Pain Goal: 0 ([id number])  Complications: No apparent anesthesia complications

## 2019-01-01 NOTE — Anesthesia Preprocedure Evaluation (Addendum)
Anesthesia Evaluation  Patient identified by MRN, date of birth, ID band Patient awake    Reviewed: Allergy & Precautions, NPO status , Patient's Chart, lab work & pertinent test results  Airway Mallampati: II  TM Distance: >3 FB Neck ROM: Full    Dental no notable dental hx.    Pulmonary neg pulmonary ROS,    Pulmonary exam normal breath sounds clear to auscultation       Cardiovascular Normal cardiovascular exam+ dysrhythmias Atrial Fibrillation + pacemaker  Rhythm:Regular Rate:Normal  hx CHB s/p PPM  PAF: Takes eliquis at home- last dose 10/25, on tikosyn for rate control  Last echo 06/2018:   1. The left ventricle has hyperdynamic systolic function, with an ejection fraction of >65%. The cavity size was normal. Left ventricular diastolic Doppler parameters are consistent with pseudonormal.  2. The right ventricle has normal systolc function. The cavity was normal. There is no increase in right ventricular wall thickness.  3. The aortic valve is grossly normal Aortic valve regurgitation is mild to moderate by color flow Doppler. No stenosis of the aortic valve.  4. The aortic root and ascending aorta are normal in size and structure.  5. The interatrial septum was not assessed.   Neuro/Psych PSYCHIATRIC DISORDERS Depression R internal capsule CVA with residual left sided weakness CVA, Residual Symptoms    GI/Hepatic negative GI ROS, Neg liver ROS,   Endo/Other  Hypothyroidism   Renal/GU negative Renal ROS  negative genitourinary   Musculoskeletal  (+) Arthritis , Osteoarthritis,    Abdominal Normal abdominal exam  (+)   Peds  Hematology  (+) Blood dyscrasia, anemia , Hct 43, plt 102   Anesthesia Other Findings S/p fall now with R femoral neck fx  Reproductive/Obstetrics                            Anesthesia Physical Anesthesia Plan  ASA: IV  Anesthesia Plan: General   Post-op Pain  Management:    Induction:   PONV Risk Score and Plan: 4 or greater and Treatment may vary due to age or medical condition, Ondansetron and Dexamethasone  Airway Management Planned: Oral ETT  Additional Equipment: None  Intra-op Plan:   Post-operative Plan: Extubation in OR  Informed Consent: I have reviewed the patients History and Physical, chart, labs and discussed the procedure including the risks, benefits and alternatives for the proposed anesthesia with the patient or authorized representative who has indicated his/her understanding and acceptance.     Dental advisory given  Plan Discussed with: CRNA  Anesthesia Plan Comments: (Last dose eliquis 72h ago. Discussed DNR with [name], daughter. She agrees to limited code, but does not wish to continued on life prolonging measures.)    Anesthesia Quick Evaluation

## 2019-01-01 NOTE — Anesthesia Procedure Notes (Signed)
Procedure Name: Intubation Date/Time: 01/01/2019 5:24 PM Performed by: Janace Litten, CRNA Pre-anesthesia Checklist: Patient identified, Emergency Drugs available, Suction available and Patient being monitored Patient Re-evaluated:Patient Re-evaluated prior to induction Oxygen Delivery Method: Circle System Utilized Preoxygenation: Pre-oxygenation with 100% oxygen Induction Type: IV induction Ventilation: Mask ventilation without difficulty Laryngoscope Size: Mac and 3 Grade View: Grade I Tube type: Oral Tube size: 7.0 mm Number of attempts: 1 Airway Equipment and Method: Stylet and Oral airway Placement Confirmation: ETT inserted through vocal cords under direct vision,  positive ETCO2 and breath sounds checked- equal and bilateral Secured at: 19 cm Tube secured with: Tape Dental Injury: Teeth and Oropharynx as per pre-operative assessment

## 2019-01-02 ENCOUNTER — Encounter (HOSPITAL_COMMUNITY): Payer: Self-pay | Admitting: Orthopedic Surgery

## 2019-01-02 DIAGNOSIS — I442 Atrioventricular block, complete: Secondary | ICD-10-CM | POA: Diagnosis not present

## 2019-01-02 DIAGNOSIS — S72001A Fracture of unspecified part of neck of right femur, initial encounter for closed fracture: Secondary | ICD-10-CM | POA: Diagnosis not present

## 2019-01-02 DIAGNOSIS — D696 Thrombocytopenia, unspecified: Secondary | ICD-10-CM | POA: Diagnosis not present

## 2019-01-02 DIAGNOSIS — I480 Paroxysmal atrial fibrillation: Secondary | ICD-10-CM | POA: Diagnosis not present

## 2019-01-02 LAB — BASIC METABOLIC PANEL
Anion gap: 11 (ref 5–15)
BUN: 31 mg/dL — ABNORMAL HIGH (ref 8–23)
CO2: 22 mmol/L (ref 22–32)
Calcium: 8 mg/dL — ABNORMAL LOW (ref 8.9–10.3)
Chloride: 106 mmol/L (ref 98–111)
Creatinine, Ser: 1.07 mg/dL — ABNORMAL HIGH (ref 0.44–1.00)
GFR calc Af Amer: 54 mL/min — ABNORMAL LOW (ref >60)
GFR calc non Af Amer: 46 mL/min — ABNORMAL LOW (ref >60)
Glucose, Bld: 117 mg/dL — ABNORMAL HIGH (ref 70–99)
Potassium: 3.9 mmol/L (ref 3.5–5.1)
Sodium: 139 mmol/L (ref 135–145)

## 2019-01-02 LAB — GLUCOSE, CAPILLARY
Glucose-Capillary: 125 mg/dL — ABNORMAL HIGH (ref 70–99)
Glucose-Capillary: 109 mg/dL — ABNORMAL HIGH (ref 70–99)

## 2019-01-02 LAB — SARS CORONAVIRUS 2 (TAT 6-24 HRS): SARS Coronavirus 2: NEGATIVE

## 2019-01-02 LAB — CBC
HCT: 35.6 % — ABNORMAL LOW (ref 36.0–46.0)
Hemoglobin: 11.8 g/dL — ABNORMAL LOW (ref 12.0–15.0)
MCH: 31.7 pg (ref 26.0–34.0)
MCHC: 33.1 g/dL (ref 30.0–36.0)
MCV: 95.7 fL (ref 80.0–100.0)
Platelets: 92 10*3/uL — ABNORMAL LOW (ref 150–400)
RBC: 3.72 MIL/uL — ABNORMAL LOW (ref 3.87–5.11)
RDW: 13.7 % (ref 11.5–15.5)
WBC: 7.5 10*3/uL (ref 4.0–10.5)
nRBC: 0 % (ref 0.0–0.2)

## 2019-01-02 MED ORDER — POTASSIUM CHLORIDE CRYS ER 20 MEQ PO TBCR
20.0000 meq | EXTENDED_RELEASE_TABLET | Freq: Once | ORAL | Status: AC
  Administered 2019-01-02: 20 meq via ORAL
  Filled 2019-01-02: qty 1

## 2019-01-02 MED ORDER — ARIPIPRAZOLE 2 MG PO TABS
2.0000 mg | ORAL_TABLET | Freq: Every day | ORAL | Status: DC
  Administered 2019-01-02 – 2019-01-03 (×2): 2 mg via ORAL
  Filled 2019-01-02 (×2): qty 1

## 2019-01-02 MED ORDER — HYDROCODONE-ACETAMINOPHEN 5-325 MG PO TABS: 1.0000 | ORAL_TABLET | Freq: Four times a day (QID) | ORAL | 0 refills | Status: DC | PRN

## 2019-01-02 MED ORDER — KETOROLAC TROMETHAMINE 30 MG/ML IJ SOLN
INTRAMUSCULAR | Status: AC
  Filled 2019-01-02: qty 1

## 2019-01-02 NOTE — Plan of Care (Signed)
  Problem: Education: Goal: Knowledge of General Education information will improve Description: Including pain rating scale, medication(s)/side effects and non-pharmacologic comfort measures Outcome: Progressing   Problem: Coping: Goal: Level of anxiety will decrease Outcome: Progressing   Problem: Pain Managment: Goal: General experience of comfort will improve Outcome: Progressing   Problem: Safety: Goal: Ability to remain free from injury will improve Outcome: Progressing   Problem: Skin Integrity: Goal: Risk for impaired skin integrity will decrease Outcome: Progressing   Problem: Activity: Goal: Risk for activity intolerance will decrease Outcome: Progressing   Problem: Pain Managment: Goal: General experience of comfort will improve Outcome: Progressing   Problem: Safety: Goal: Ability to remain free from injury will improve Outcome: Progressing   Problem: Skin Integrity: Goal: Risk for impaired skin integrity will decrease Outcome: Progressing

## 2019-01-02 NOTE — TOC Initial Note (Signed)
Transition of Care [facility]) - Initial/Assessment Note    Patient Details  Name: Katrina Rodriguez MRN: [id number] Date of Birth: Jul 07, 1928  Transition of Care [facility]) CM/SW Contact:    Midge Minium RN, BSN, NCM-BC, ACM-RN [phone] (working remotely) Phone Number: 01/02/2019, 3:06 PM  Clinical Narrative:                 CM following for dispositional needs. CM spoke to the patients daughter, [name], via phone to discuss the POC d/t the patients cognitive impairment (identifiers verified). Patient is an 85 y/o female admitted after falling in which she sustained a R hip fracture, now s/p hemiarthroplasty R hip. Patient lived at home alone, blind and [place]. PT/OT eval completed with SNF recommended. CM discussed the recommendations with the daughter and she had already initiated the ST SNF process, with preference choice [facility]. FL2/PASRR completed with referral sent to [facility] as requested (pending bed availability). CM team will continue to follow.   Expected Discharge Plan: [place] Barriers to Discharge: SNF Pending bed offer   Patient Goals and CMS Choice Patient states their goals for this hospitalization and ongoing recovery are:: "unable to state"([place]) CMS Medicare.gov Compare Post Acute Care list provided to:: Patient Represenative (must comment) Choice offered to / list presented to : Adult Children([place])  Expected Discharge Plan and Services Expected Discharge Plan: [place] In-house Referral: Clinical Social Work Discharge Planning Services: CM Consult Post Acute Care Choice: [place] arrangements for the past 2 months: Single Family Home                 DME Arranged: N/A DME Agency: NA       HH Arranged: NA [place] Agency: NA        Prior Living Arrangements/Services Living arrangements for the past 2 months: [place] with:: Self Patient language and need for interpreter  reviewed:: Yes Do you feel safe going back to the place where you live?: Yes      Need for Family Participation in Patient Care: Yes (Comment) Care giver support system in place?: Yes (comment) Current home services: DME Criminal Activity/Legal Involvement Pertinent to Current Situation/Hospitalization: No - Comment as needed  Activities of Daily Living Home Assistive Devices/Equipment: Bedside commode/3-in-1, Walker (specify type) ADL Screening (condition at time of admission) Patient's cognitive ability adequate to safely complete daily activities?: Yes Is the patient deaf or have difficulty hearing?: No Does the patient have difficulty seeing, even when wearing glasses/contacts?: Yes Does the patient have difficulty concentrating, remembering, or making decisions?: Yes Patient able to express need for assistance with ADLs?: Yes Does the patient have difficulty dressing or bathing?: Yes Independently performs ADLs?: No Communication: Independent Dressing (OT): Needs assistance Is this a change from baseline?: Pre-admission baseline Grooming: Needs assistance Is this a change from baseline?: Pre-admission baseline Feeding: Needs assistance Is this a change from baseline?: Pre-admission baseline Bathing: Needs assistance Is this a change from baseline?: Pre-admission baseline Toileting: Needs assistance Is this a change from baseline?: Pre-admission baseline In/Out Bed: Needs assistance Is this a change from baseline?: Pre-admission baseline Walks in Home: Needs assistance Is this a change from baseline?: Pre-admission baseline Does the patient have difficulty walking or climbing stairs?: Yes Weakness of Legs: Right Weakness of Arms/Hands: None  Permission Sought/Granted Permission sought to share information with : Case Manager, Customer service manager Permission granted to share information with : Yes, Verbal Permission Granted     Permission  granted to share info w  AGENCY: [facility]        Emotional Assessment       Orientation: : Oriented to Self Alcohol / Substance Use: Not Applicable Psych Involvement: No (comment)  Admission diagnosis:  Fracture [T14.8XXA] Hip fracture ([place]) [S72.009A] Fall [W19.XXXA] Closed fracture of neck of right femur, initial encounter ([place]) [S72.001A] Fall, initial encounter [W19.XXXA] Patient Active Problem List   Diagnosis Date Noted  . Hip fracture ([place]) 12/30/2018  . QT prolongation 12/30/2018  . Thrombocytopenia ([place]) 12/30/2018  . Closed fracture of neck of right femur ([place])   . Visual impairment 12/19/2018  . Hallucinations 12/19/2018  . TIA (transient ischemic attack) 07/01/2018  . Adjustment disorder with anxious mood 05/26/2018  . Hypothyroidism 06/19/2017  . Left-sided weakness 06/19/2017  . 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 Encounter for therapeutic drug monitoring 06/09/2013  . Complete heart block ([place]) 06/09/2013  . Syncope 04/24/2013  . H/O   . PAROXYSMAL ATRIAL FIBRILLATION 02/02/2010  . 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 DEEP VENOUS THROMBOPHLEBITIS 09/17/2008  . HYPOTENSION, ORTHOSTATIC 09/17/2008  . SYNCOPE 09/17/2008  . CHEST PAIN 09/17/2008  . PPM-Medtronic 09/17/2008   PCP:  Leighton Ruff, MD Pharmacy:   [facility], [place] [place] [address] Phone: [phone] Fax: [phone]  [id number] [address], [place] [address] Phone: [phone] Fax: [phone]     Social Determinants of Health (SDOH) Interventions    Readmission Risk Interventions No flowsheet data found.

## 2019-01-02 NOTE — Anesthesia Postprocedure Evaluation (Signed)
Anesthesia Post Note  Patient: Katrina Rodriguez  Procedure(s) Performed: ARTHROPLASTY BIPOLAR HIP (HEMIARTHROPLASTY) (Right Hip)     Patient location during evaluation: PACU Anesthesia Type: General Level of consciousness: awake and alert Pain management: pain level controlled Vital Signs Assessment: post-procedure vital signs reviewed and stable Respiratory status: spontaneous breathing, nonlabored ventilation, respiratory function stable and patient connected to nasal cannula oxygen Cardiovascular status: blood pressure returned to baseline and stable Postop Assessment: no apparent nausea or vomiting Anesthetic complications: no    Last Vitals:  Vitals:   01/02/19 0031 01/02/19 0329  BP: 126/90 128/66  Pulse: 95 85  Resp: 20 18  Temp: (!) 36.3 C 36.6 C  SpO2: 97% 96%    Last Pain:  Vitals:   01/02/19 0329  TempSrc: Oral  PainSc:                  [name]

## 2019-01-02 NOTE — Progress Notes (Signed)
Physical Therapy Evaluation  Clinical Impression: Pt presented supine in bed with HOB elevated, awake and willing to participate in therapy session. Pt pleasant but very confused throughout session. No family or caregivers present to assist with providing history information. At the time of evaluation, pt very limited with mobility secondary to pain and weakness. Pt required total A x2 for bed mobility and transfers. Pt will require post-acute rehab at SNF for further intensive therapy services. Pt would continue to benefit from skilled physical therapy services at this time while admitted and after d/c to address the below listed limitations in order to improve overall safety and independence with functional mobility.    01/02/19 1200  PT Visit Information  Last PT Received On 01/02/19  Assistance Needed +2  PT/OT/SLP Co-Evaluation/Treatment Yes  Reason for Co-Treatment For patient/therapist safety;To address functional/ADL transfers  PT goals addressed during session Mobility/safety with mobility;Balance;Strengthening/ROM  History of Present Illness Pt is an 85 y/o female admitted after falling in which she sustained a R hip fracture, now s/p hemiarthroplasty bipolar R hip. PMH including but not limited to HTN, a-fib and dementia.  Precautions  Precautions Fall  Restrictions  Weight Bearing Restrictions Yes  RLE Weight Bearing WBAT  Home Living  Family/patient expects to be discharged to: Unsure  Additional Comments pt with cognitive deficits and unable to provide any reliable information; no family/caregivers present either  Prior Function  Comments Unsure - pt with cognitive deficits and unable to provide any reliable information; no family/caregivers present either  Communication  Communication HOH  Pain Assessment  Pain Assessment Faces  Faces Pain Scale 8  Pain Location R hip  Pain Descriptors / Indicators Grimacing;Guarding  Pain Intervention(s) Monitored during  session;Repositioned  Cognition  Arousal/Alertness Awake/alert  Behavior During Therapy WFL for tasks assessed/performed;Anxious (fearful of falling)  Overall Cognitive Status No family/caregiver present to determine baseline cognitive functioning (pt with cognitive deficits at baseline)  Area of Impairment Orientation;Attention;Memory;Following commands;Safety/judgement;Problem solving;Awareness  Orientation Level Disoriented to;Place;Time;Situation  Current Attention Level Focused  Memory Decreased recall of precautions;Decreased short-term memory  Following Commands Follows one step commands inconsistently  Safety/Judgement Decreased awareness of deficits;Decreased awareness of safety  Awareness Intellectual  Problem Solving Slow processing;Decreased initiation;Difficulty sequencing;Requires verbal cues;Requires tactile cues  Upper Extremity Assessment  Upper Extremity Assessment Defer to OT evaluation  Lower Extremity Assessment  Lower Extremity Assessment Generalized weakness;RLE deficits/detail;Difficult to assess due to impaired cognition  RLE Deficits / Details pt with decreased strength and ROM limitations secondary to post-op pain and weakness  Cervical / Trunk Assessment  Cervical / Trunk Assessment Kyphotic  Bed Mobility  Overal bed mobility Needs Assistance  Bed Mobility Supine to Sit  Supine to sit Total assist;+2 for physical assistance  General bed mobility comments total A for all aspects with use of bed pads to position pt's hips at EOB  Transfers  Overall transfer level Needs assistance  Transfers Lateral/Scoot Transfers   Lateral/Scoot Transfers Total assist;+2 physical assistance  General transfer comment face-to-face method with gait belt and use of bed pads; pt tolerated lateral scoot from EOB to recliner chair towards her L side with total A x2  Ambulation/Gait  General Gait Details unable  Balance  Overall balance assessment Needs assistance;History of  Falls  Sitting-balance support Feet supported;Bilateral upper extremity supported;Single extremity supported  Sitting balance-Leahy Scale Poor  Sitting balance - Comments pt with consistent L lateral lean and requiring mod-max A to maintain upright sitting  Postural control Posterior lean;Left lateral lean  PT -  End of Session  Equipment Utilized During Treatment Gait belt  Activity Tolerance Patient limited by pain;Patient limited by fatigue  Patient left in chair;with call bell/phone within reach;with chair alarm set  Nurse Communication Mobility status;Need for lift equipment  PT Assessment  PT Recommendation/Assessment Patient needs continued PT services  PT Visit Diagnosis Other abnormalities of gait and mobility (R26.89);Pain  Pain - Right/Left Right  Pain - part of body Hip  PT Problem List Decreased strength;Decreased range of motion;Decreased activity tolerance;Decreased balance;Decreased mobility;Decreased cognition;Decreased coordination;Decreased knowledge of use of DME;Decreased safety awareness;Decreased knowledge of precautions;Pain  PT Plan  PT Frequency (ACUTE ONLY) Min 3X/week  PT Treatment/Interventions (ACUTE ONLY) DME instruction;Gait training;Stair training;Functional mobility training;Therapeutic activities;Therapeutic exercise;Balance training;Neuromuscular re-education;Cognitive remediation;Patient/family education  AM-PAC PT "6 Clicks" Mobility Outcome Measure (Version 2)  Help needed turning from your back to your side while in a flat bed without using bedrails? 1  Help needed moving from lying on your back to sitting on the side of a flat bed without using bedrails? 1  Help needed moving to and from a bed to a chair (including a wheelchair)? 1  Help needed standing up from a chair using your arms (e.g., wheelchair or bedside chair)? 1  Help needed to walk in hospital room? 1  Help needed climbing 3-5 steps with a railing?  1  6 Click Score 6  Consider  Recommendation of Discharge To: CIR/SNF/LTACH  PT Recommendation  Follow Up Recommendations SNF  PT equipment None recommended by PT  Individuals Consulted  Consulted and Agree with Results and Recommendations Patient unable/family or caregiver not available  Acute Rehab PT Goals  Patient Stated Goal unable to state  PT Goal Formulation Patient unable to participate in goal setting  Time For Goal Achievement 01/16/19  Potential to Achieve Goals Fair  PT Time Calculation  PT Start Time (ACUTE ONLY) 1200  PT Stop Time (ACUTE ONLY) 1217  PT Time Calculation (min) (ACUTE ONLY) 17 min  PT General Charges  $$ ACUTE PT VISIT 1 Visit  PT Evaluation  $PT Eval Moderate Complexity 1 Mod   Sherie Don, PT, DPT  Acute Rehabilitation Services Pager [phone] Office [phone]

## 2019-01-02 NOTE — Care Management Important Message (Signed)
Important Message  Patient Details  Name: Katrina Rodriguez MRN: [id number] Date of Birth: 19-Aug-1928   Medicare Important Message Given:  Yes     [name] 01/02/2019, 2:20 PM

## 2019-01-02 NOTE — NC FL2 (Signed)
[place] MEDICAID FL2 LEVEL OF CARE SCREENING TOOL     IDENTIFICATION  Patient Name: Katrina Rodriguez Birthdate: 1928-08-27 Sex: female Admission Date (Current Location): 12/30/2018  [facility] and [place] Number:  Herbalist and Address:  [facility] [place] [address], [place], [place] [address]      Provider Number: [id number]  Attending Physician Name and Address:  Domenic Polite, MD  Relative Name and Phone Number:  [name] [phone]    Current Level of Care: Hospital Recommended Level of Care: [place] Prior Approval Number:    Date Approved/Denied:   PASRR Number: [id number]  Discharge Plan: SNF    Current Diagnoses: Patient Active Problem List   Diagnosis Date Noted  . Hip fracture ([place]) 12/30/2018  . QT prolongation 12/30/2018  . Thrombocytopenia ([place]) 12/30/2018  . Closed fracture of neck of right femur ([place])   . Visual impairment 12/19/2018  . Hallucinations 12/19/2018  . TIA (transient ischemic attack) 07/01/2018  . Adjustment disorder with anxious mood 05/26/2018  . Hypothyroidism 06/19/2017  . Left-sided weakness 06/19/2017  . 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 Encounter for therapeutic drug monitoring 06/09/2013  . Complete heart block ([place]) 06/09/2013  . Syncope 04/24/2013  . H/O   . PAROXYSMAL ATRIAL FIBRILLATION 02/02/2010  . 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 DEEP VENOUS THROMBOPHLEBITIS 09/17/2008  . HYPOTENSION, ORTHOSTATIC 09/17/2008  . SYNCOPE 09/17/2008  . CHEST PAIN 09/17/2008  . PPM-Medtronic 09/17/2008    Orientation RESPIRATION BLADDER Height & Weight     Self, Situation, Place  Normal Incontinent, External catheter Weight: 50.8 kg Height:  5\' 5"  (165.1 cm)  BEHAVIORAL SYMPTOMS/MOOD NEUROLOGICAL BOWEL NUTRITION STATUS  Other (Comment)(N/A) (N/A) Continent Diet  AMBULATORY STATUS COMMUNICATION OF NEEDS Skin   Extensive Assist Verbally (Right hip)                     Personal Care Assistance Level of Assistance  Bathing, Feeding, Dressing Bathing Assistance: Maximum assistance Feeding assistance: Maximum assistance Dressing Assistance: Limited assistance     Functional Limitations Info  Sight, Hearing, Speech Sight Info: Impaired Hearing Info: Impaired(HOH) Speech Info: Adequate    SPECIAL CARE FACTORS FREQUENCY  PT (By licensed PT), OT (By licensed OT)     PT Frequency: Min 3X/week OT Frequency: Min 3X/week            Contractures Contractures Info: Not present    Additional Factors Info  Code Status, Allergies Code Status Info: DNR Allergies Info: Xarelto, Amiodarone, Amoxicillin, ASA, Codeine, Darvocet, Demerol, Eliquis, PCNs           Current Medications (01/02/2019):  This is the current hospital active medication list Current Facility-Administered Medications  Medication Dose Route Frequency Provider Last Rate Last Dose  . apixaban (ELIQUIS) tablet 2.5 mg  2.5 mg Oral BID Rod Can, MD   2.5 mg at 01/02/19 0944  . ARIPiprazole (ABILIFY) tablet 2 mg  2 mg Oral Daily Domenic Polite, MD   2 mg at 01/02/19 1249  . docusate sodium (COLACE) capsule 100 mg  100 mg Oral BID Rod Can, MD   100 mg at 01/02/19 0943  . dofetilide (TIKOSYN) capsule 125 mcg  125 mcg Oral BID Rod Can, MD   125 mcg at 01/02/19 0751  . HYDROcodone-acetaminophen (NORCO/VICODIN) 5-325 MG per tablet 1-2 tablet  1-2 tablet Oral Q6H PRN Rod Can, MD   1 tablet at 12/31/18 2103  . LORazepam (ATIVAN) injection 0.5 mg  0.5 mg Intravenous Q6H PRN Swinteck, Aaron Edelman, MD      . menthol-cetylpyridinium (CEPACOL) lozenge 3 mg  1 lozenge Oral PRN Swinteck, Aaron Edelman, MD       Or  . phenol (CHLORASEPTIC) mouth spray 1 spray  1 spray Mouth/Throat PRN Swinteck, Aaron Edelman, MD      . morphine 2 MG/ML injection 0.5 mg  0.5 mg Intravenous Q2H PRN Rod Can, MD   0.5 mg at 12/30/18 0943  . mupirocin ointment (BACTROBAN) 2 % 1 application  1  application Nasal BID Rod Can, MD   1 application at [phone]  . ondansetron (ZOFRAN) tablet 4 mg  4 mg Oral Q6H PRN Swinteck, Aaron Edelman, MD       Or  . ondansetron (ZOFRAN) injection 4 mg  4 mg Intravenous Q6H PRN Swinteck, Aaron Edelman, MD      . potassium chloride SA (KLOR-CON) CR tablet 40 mEq  40 mEq Oral Daily Rod Can, MD   40 mEq at 01/02/19 [id number]     Discharge Medications: Please see discharge summary for a list of discharge medications.  Relevant Imaging Results:  Relevant Lab Results:   Additional Information SSN: [id number]  [place], BSN, NCM-BC, ACM-RN [phone]

## 2019-01-02 NOTE — Progress Notes (Addendum)
    Subjective:  Patient reports pain as mild.  Denies N/V/CP/SOB.   Objective:   VITALS:   Vitals:   01/01/19 2021 01/02/19 0031 01/02/19 0329 01/02/19 1454  BP: 120/71 126/90 128/66 111/74  Pulse: 93 95 85 73  Resp: 14 20 18 16   Temp: 98.6 F (37 C) (!) 97.4 F (36.3 C) 97.8 F (36.6 C) 99.9 F (37.7 C)  TempSrc: Axillary Oral Oral Oral  SpO2: 97% 97% 96% 94%  Weight:      Height:        NAD, pleasantly confused Sensation intact distally Intact pulses distally Dorsiflexion/Plantar flexion intact Incision: dressing C/D/I Compartment soft   Lab Results  Component Value Date   WBC 7.5 01/02/2019   HGB 11.8 (L) 01/02/2019   HCT 35.6 (L) 01/02/2019   MCV 95.7 01/02/2019   PLT 92 (L) 01/02/2019   BMET    Component Value Date/Time   NA 139 01/02/2019 0410   NA 142 03/07/2018 1019   K 3.9 01/02/2019 0410   CL 106 01/02/2019 0410   CO2 22 01/02/2019 0410   GLUCOSE 117 (H) 01/02/2019 0410   BUN 31 (H) 01/02/2019 0410   BUN 21 03/07/2018 1019   CREATININE 1.07 (H) 01/02/2019 0410   CREATININE 1.25 (H) 07/30/2015 1106   CALCIUM 8.0 (L) 01/02/2019 0410   GFRNONAA 46 (L) 01/02/2019 0410   GFRAA 54 (L) 01/02/2019 0410     Assessment/Plan: 1 Day Post-Op   Principal Problem:   Hip fracture (HCC) Active Problems:   PAROXYSMAL ATRIAL FIBRILLATION   BRADYCARDIA-TACHYCARDIA SYNDROME   Complete heart block (HCC)   QT prolongation   Thrombocytopenia (HCC)   WBAT with walker DVT ppx: eliquis, SCDs, TEDS PO pain control PT/OT Dispo: SNF placement   Katrina Rodriguez 01/02/2019, 3:12 PM   Rod Can, MD [phone] [place] [facility] now [facility]  [place] [address], [address], [place], [place] [address] Phone: [phone] [email or web address]  [employer]

## 2019-01-02 NOTE — Progress Notes (Signed)
PROGRESS NOTE    HARRY SCHERER  [id number] DOB: [birth year] DOA: 12/30/2018 PCP: Leighton Ruff, MD   Brief Narrative:  85 year old female with history of paroxysmal atrial fibrillation on apixaban, tikosyn, complete heart block status post PPM, stroke with left hemiparesis, memory loss, delusions, hypothyroidism, thrombocytopenia, orthostatic hypotension presented after mechanical fall resulting in right hip pain.  The patient has had gait instability since her previous stroke.  She lost her balance and fell onto her right side.  In the ED, x-ray showed impacted right femoral neck fracture.  Ortho was consulted to assist with management.  Cardiology was consulted for cardiac clearance. -Underwent right hip hemiarthroplasty 10/28 by Dr. Lyla Glassing  Assessment & Plan:   Right hip fracture secondary to mechanical fall -X-ray of right hip and pelvis showing displaced and impacted fracture of the right femoral neck. -Orthopedic Surgery consulted, underwent right hip hemiarthroplasty 10/28, Dr. Lyla Glassing -Restart Eliquis today  -Physical therapy  Dementia, memory loss, gait disturbance, hallucinations -Patient has been followed by Dr. Floyde Parkins of [facility] neurology, he has suspected Lewy body dementia according to patient's daughter [name], she was recently started on Abilify to help with agitation, will restart this at a lower dose  -Definitely at risk of more delirium and confusion postop as well as at rehab  Paroxysmal atrial fibrillation, history of ablation, prolonged QTC -Restart Eliquis  -Currently on Tikosyn, QTC prolonged at 511 but felt to be stable, seen by cardiology recommended to continue Tikosyn at current dose -Caution with other QT prolonging meds, Seroquel held -Continue KCl daily, with a goal to keep potassium around 4, continue telemetry monitoring  Chronic mild thrombocytopenia -Platelet count around 100 K, slightly lower at 92,000 today -chronic dating back to  2010 -Continue to monitor  DVT prophylaxis: Eliquis Code Status: DNR Family Communication: No family at bedside, called and updated daughter [name] Disposition Plan: Will need short-term rehab  Consultants:   Orthopedic Surgery  Cardiology  Procedures: Right hip hemiarthroplasty 10/20    Antimicrobials: Anti-infectives (From admission, onward)   Start     Dose/Rate Route Frequency Ordered Stop   01/02/19 0600  clindamycin (CLEOCIN) IVPB 900 mg     900 mg 100 mL/hr over 30 Minutes Intravenous On call to O.R. 01/01/19 1536 01/01/19 1755   01/02/19 0000  clindamycin (CLEOCIN) IVPB 600 mg     600 mg 100 mL/hr over 30 Minutes Intravenous Every 6 hours 01/01/19 2023 01/02/19 0614   01/01/19 1541  clindamycin (CLEOCIN) 900 MG/50ML IVPB    Note to Pharmacy: Grace Blight   : cabinet override      01/01/19 1541 01/01/19 1740   12/30/18 1115  clindamycin (CLEOCIN) IVPB 900 mg     900 mg 100 mL/hr over 30 Minutes Intravenous On call to O.R. 12/30/18 1105 12/31/18 0559      Subjective: -Elderly pleasant female, confused overnight, talks about her sisters funeral  Objective: Vitals:   01/01/19 1956 01/01/19 2021 01/02/19 0031 01/02/19 0329  BP: 122/79 120/71 126/90 128/66  Pulse: 91 93 95 85  Resp: 18 14 20 18   Temp:  98.6 F (37 C) (!) 97.4 F (36.3 C) 97.8 F (36.6 C)  TempSrc:  Axillary Oral Oral  SpO2: 100% 97% 97% 96%  Weight:      Height:        Intake/Output Summary (Last 24 hours) at 01/02/2019 1125 Last data filed at 01/02/2019 0400 Gross per 24 hour  Intake 750 ml  Output 100 ml  Net 650 ml  Filed Weights   12/30/18 0152 12/30/18 1131  Weight: 50.8 kg 50.8 kg    Examination:  Gen: 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   Data Reviewed: I have personally reviewed following labs and imaging studies  CBC: Recent Labs  Lab 12/30/18 0220 12/30/18 0840 12/31/18 0349 01/01/19 0238 01/02/19 0410  WBC 4.3 8.0 6.9 7.9 7.5  NEUTROABS 3.0  --   --   --   --   HGB 12.3 11.9* 12.0 14.1 11.8*  HCT 37.4 36.4 36.1 43.3 35.6*  MCV 97.9 97.1 96.8 98.0 95.7  PLT 126* 109* 101* 102* 92*   Basic Metabolic Panel: Recent Labs  Lab 12/30/18 0220 12/30/18 1100 12/31/18 0349 01/01/19 0238 01/02/19 0410  NA 139  --  138 139 139  K 3.7  --  3.8 3.4* 3.9  CL 105  --  106 104 106  CO2 24  --  23 24 22   GLUCOSE 112*  --  120* 105* 117*  BUN 33*  --  26* 25* 31*  CREATININE 1.20*  --  1.10* 1.07* 1.07*  CALCIUM 8.6*  --  8.7* 8.6* 8.0*  MG  --  2.2  --   --   --    GFR: Estimated Creatinine Clearance: 29.1 mL/min (A) (by C-G formula based on SCr of 1.07 mg/dL (H)). Liver Function Tests: Recent Labs  Lab 01/01/19 0238  AST 37  ALT 22  ALKPHOS 74  BILITOT 1.2  PROT 6.4*  ALBUMIN 3.6   No results for input(s): LIPASE, AMYLASE in the last 168 hours. No results for input(s): AMMONIA in the last 168 hours. Coagulation Profile: No results for input(s): INR, PROTIME in the last 168 hours. Cardiac Enzymes: No results for input(s): CKTOTAL, CKMB, CKMBINDEX, TROPONINI in the last 168 hours. BNP (last 3 results) No results for input(s): PROBNP in the last 8760 hours. HbA1C: No results for input(s): HGBA1C in the last 72 hours. CBG: Recent Labs  Lab 12/30/18 0525 01/01/19 2108 01/02/19 0706  GLUCAP 111* 120* 125*   Lipid Profile: No results for input(s): CHOL, HDL, LDLCALC, TRIG, CHOLHDL, LDLDIRECT in the last 72 hours. Thyroid Function Tests: No results for input(s): TSH, T4TOTAL, FREET4, T3FREE, THYROIDAB in the last 72 hours.  Anemia Panel: No results for input(s): VITAMINB12, FOLATE, FERRITIN, TIBC, IRON, RETICCTPCT in the last 72 hours. Sepsis Labs: No results for input(s): PROCALCITON, LATICACIDVEN in the last 168 hours.  Recent Results (from the past 240 hour(s))  SARS CORONAVIRUS 2 (TAT 6-24 HRS) Nasopharyngeal Nasopharyngeal Swab     Status: None   Collection Time: 12/30/18  2:05 AM   Specimen: Nasopharyngeal Swab  Result Value Ref Range Status   SARS Coronavirus 2 NEGATIVE NEGATIVE Final    Comment: (NOTE) SARS-CoV-2 target nucleic acids are NOT DETECTED. The SARS-CoV-2 RNA is generally detectable in upper and lower respiratory specimens during the acute phase of infection. Negative results do not preclude SARS-CoV-2 infection, do not rule out co-infections with other pathogens, and should not be used as the sole basis for treatment or other patient management decisions. Negative results must be combined with clinical observations, patient history, and  epidemiological information. The expected result is Negative. Fact Sheet for Patients: SugarRoll.be Fact Sheet for Healthcare Providers: https://www.woods-mathews.com/ This test is not yet approved or cleared by the [place] FDA and  has been authorized for detection and/or diagnosis of SARS-CoV-2 by FDA under an Emergency Use Authorization (EUA). This EUA will remain  in effect (meaning this test can be used) for the duration of the COVID-19 declaration under Section 56 4(b)(1) of the Act, 21 U.S.C. section 360bbb-3(b)(1), unless the authorization is terminated or revoked sooner. Performed at [facility], [place] [address], [place], [place] [address]   Surgical PCR screen     Status: None   Collection Time: 12/30/18 12:46 PM   Specimen: Nasal Mucosa; Nasal Swab  Result Value Ref Range Status   MRSA, PCR NEGATIVE NEGATIVE Final   Staphylococcus aureus NEGATIVE NEGATIVE Final    Comment: (NOTE)  The Xpert SA Assay (FDA approved for NASAL specimens in patients 68 years of age and older), is one component of a comprehensive surveillance program. It is not intended to diagnose infection nor to guide or monitor treatment. Performed at [facility], [place] [address] [place], [place] [address]      Radiology Studies: Dg Pelvis Portable  Result Date: 01/01/2019 CLINICAL DATA:  Status post right hip replacement EXAM: PORTABLE PELVIS 1 VIEWS COMPARISON:  Intraoperative films from earlier in the same day. FINDINGS: Right hip replacement is noted in satisfactory position. No acute bony or soft tissue abnormality is noted. IMPRESSION: Status post right hip replacement Electronically Signed   By: [name] M.D.   On: 01/01/2019 21:48   Dg C-arm 1-60 Min  Result Date: 01/01/2019 CLINICAL DATA:  Right hip replacement EXAM: OPERATIVE RIGHT HIP WITH PELVIS; DG C-ARM 1-60 MIN COMPARISON:  None. FLUOROSCOPY TIME:  Radiation Exposure Index (as provided by the fluoroscopic device): Not available If the device does not provide the exposure index: Fluoroscopy Time:  6 seconds Number of Acquired Images:  2 FINDINGS: Right hip replacement is noted. No soft tissue abnormality or bony abnormality is seen. IMPRESSION: Status post right hip replacement. Electronically Signed   By: [name] M.D.   On: 01/01/2019 20:17   Dg Hip Operative Unilat W Or W/o Pelvis Left  Result Date: 01/01/2019 CLINICAL DATA:  Right hip replacement EXAM: OPERATIVE RIGHT HIP WITH PELVIS; DG C-ARM 1-60 MIN COMPARISON:  None. FLUOROSCOPY TIME:  Radiation Exposure Index (as provided by the fluoroscopic device): Not available If the device does not provide the exposure index: Fluoroscopy Time:  6 seconds Number of Acquired Images:  2 FINDINGS: Right hip replacement is noted. No soft tissue abnormality or bony abnormality is seen. IMPRESSION: Status post right hip replacement. Electronically Signed   By: [name] M.D.   On:  01/01/2019 20:17    Scheduled Meds: . apixaban  2.5 mg Oral BID  . docusate sodium  100 mg Oral BID  . dofetilide  125 mcg Oral BID  . mupirocin ointment  1 application Nasal BID  . potassium chloride  40 mEq Oral Daily   Continuous Infusions:   LOS: 3 days   Domenic Polite, MD [facility]  01/02/2019, 11:25 AM

## 2019-01-02 NOTE — Evaluation (Signed)
Occupational Therapy Evaluation Patient Details Name: Katrina Rodriguez MRN: [id number] DOB: 08/12/1928 Today's Date: 01/02/2019    History of Present Illness Pt isa n 85 y/o female admitted after falling in which she sustained a R hip fracture, now s/p hemiarthroplasty bipolar R hip. PMH including but not limited to    Clinical Impression   Pt with decline in function and safety with ADLs and ADL mobility with impaired strength, balance, endurance and hx of dementia (Lewy body). Pt unable to provide a PLOF. Pt currently requires total A + 2 for bed mobility, Poor sitting balance, total A +2 with lateral scoots to recliner, total A with ADLs/selfcare. Pt would benefit from acute OT services to address impairments to maximize level of function and safety    Follow Up Recommendations  SNF;Supervision/Assistance - 24 hour    Equipment Recommendations  Other (comment)(TBD at next venue of care)    Recommendations for Other Services       Precautions / Restrictions Precautions Precautions: Fall Restrictions Weight Bearing Restrictions: Yes RLE Weight Bearing: Weight bearing as tolerated      Mobility Bed Mobility Overal bed mobility: Needs Assistance Bed Mobility: Supine to Sit     Supine to sit: Total assist;+2 for physical assistance     General bed mobility comments: total A for all aspects with use of bed pads to position pt's hips at EOB  Transfers Overall transfer level: Needs assistance   Transfers: Lateral/Scoot Transfers          Lateral/Scoot Transfers: Total assist;+2 physical assistance General transfer comment: face-to-face method with gait belt and use of bed pads; pt tolerated lateral scoot from EOB to recliner chair towards her L side with total A x2    Balance Overall balance assessment: Needs assistance;History of Falls Sitting-balance support: Feet supported;Bilateral upper extremity supported;Single extremity supported Sitting balance-Leahy Scale:  Poor Sitting balance - Comments: pt with consistent L lateral lean and requiring mod-max A to maintain upright sitting Postural control: Posterior lean;Left lateral lean   Standing balance-Leahy Scale: Zero                             ADL either performed or assessed with clinical judgement   ADL Overall ADL's : Needs assistance/impaired Eating/Feeding: Set up;Sitting   Grooming: Wash/dry hands;Wash/dry face;Sitting;Maximal assistance   Upper Body Bathing: Total assistance   Lower Body Bathing: Total assistance   Upper Body Dressing : Total assistance   Lower Body Dressing: Total assistance   Toilet Transfer: Total assistance;+2 for physical assistance Toilet Transfer Details (indicate cue type and reason): simulated by lateral scoot to drop arm recliner Toileting- Clothing Manipulation and Hygiene: Total assistance       Functional mobility during ADLs: Total assistance;+2 for physical assistance General ADL Comments: uncertain of PLOF with ADLs at this time     Vision Baseline Vision/History: Wears glasses Patient Visual Report: No change from baseline       Perception     Praxis      Pertinent Vitals/Pain Pain Assessment: Faces Faces Pain Scale: Hurts whole lot Pain Location: R hip Pain Descriptors / Indicators: Grimacing;Guarding Pain Intervention(s): Limited activity within patient's tolerance;Monitored during session;Repositioned     Hand Dominance Right   Extremity/Trunk Assessment Upper Extremity Assessment Upper Extremity Assessment: Generalized weakness   Lower Extremity Assessment Lower Extremity Assessment: Defer to PT evaluation RLE Deficits / Details: pt with decreased strength and ROM limitations secondary to post-op pain  and weakness   Cervical / Trunk Assessment Cervical / Trunk Assessment: Kyphotic   Communication Communication Communication: HOH   Cognition Arousal/Alertness: Awake/alert Behavior During Therapy: WFL for  tasks assessed/performed;Anxious Overall Cognitive Status: No family/caregiver present to determine baseline cognitive functioning Area of Impairment: Orientation;Attention;Memory;Following commands;Safety/judgement;Problem solving;Awareness                 Orientation Level: Disoriented to;Place;Time;Situation Current Attention Level: Focused Memory: Decreased recall of precautions;Decreased short-term memory Following Commands: Follows one step commands inconsistently Safety/Judgement: Decreased awareness of deficits;Decreased awareness of safety Awareness: Intellectual Problem Solving: Slow processing;Decreased initiation;Difficulty sequencing;Requires verbal cues;Requires tactile cues     General Comments       Exercises     Shoulder Instructions      Home Living Family/patient expects to be discharged to:: Unsure                                 Additional Comments: pt with cognitive deficits and unable to provide any reliable information; no family/caregivers present either      Prior Functioning/Environment          Comments: Unsure - pt with cognitive deficits and unable to provide any reliable information; no family/caregivers present either        OT Problem List: Decreased strength;Impaired balance (sitting and/or standing);Decreased cognition;Decreased knowledge of precautions;Pain;Decreased safety awareness;Decreased activity tolerance;Decreased knowledge of use of DME or AE      OT Treatment/Interventions: Self-care/ADL training;DME and/or AE instruction;Therapeutic activities;Patient/family education;Therapeutic exercise    OT Goals(Current goals can be found in the care plan section) Acute Rehab OT Goals Patient Stated Goal: unable to state OT Goal Formulation: Patient unable to participate in goal setting Time For Goal Achievement: 01/16/19 Potential to Achieve Goals: Fair ADL Goals Pt Will Perform Grooming: with mod  assist;sitting Pt Will Perform Upper Body Bathing: with max assist;with mod assist;sitting Pt Will Perform Upper Body Dressing: with max assist;with mod assist;sitting Pt Will Transfer to Toilet: with max assist;with mod assist;with +2 assist;squat pivot transfer;stand pivot transfer;bedside commode  OT Frequency: Min 2X/week   Barriers to D/C: Decreased caregiver support          Co-evaluation PT/OT/SLP Co-Evaluation/Treatment: Yes Reason for Co-Treatment: For patient/therapist safety;To address functional/ADL transfers PT goals addressed during session: Mobility/safety with mobility;Balance;Strengthening/ROM OT goals addressed during session: ADL's and self-care      AM-PAC OT "6 Clicks" Daily Activity     Outcome Measure Help from another person eating meals?: A Little Help from another person taking care of personal grooming?: A Little Help from another person toileting, which includes using toliet, bedpan, or urinal?: Total Help from another person bathing (including washing, rinsing, drying)?: Total Help from another person to put on and taking off regular upper body clothing?: Total Help from another person to put on and taking off regular lower body clothing?: Total 6 Click Score: 10   End of Session Equipment Utilized During Treatment: Gait belt Nurse Communication: Mobility status  Activity Tolerance: Patient limited by pain Patient left: in chair;with call bell/phone within reach;with chair alarm set  OT Visit Diagnosis: Other abnormalities of gait and mobility (R26.89);Unsteadiness on feet (R26.81);Muscle weakness (generalized) (M62.81);History of falling (Z91.81);Other symptoms and signs involving the nervous system (R29.898);Other symptoms and signs involving cognitive function;Pain Pain - Right/Left: Right Pain - part of body: Hip                Time: 1201-1217  OT Time Calculation (min): 16 min Charges:  OT General Charges $OT Visit: 1 Visit OT Evaluation $OT  Eval Moderate Complexity: 1 Mod    Britt Bottom 01/02/2019, 1:07 PM

## 2019-01-03 DIAGNOSIS — E039 Hypothyroidism, unspecified: Secondary | ICD-10-CM | POA: Diagnosis not present

## 2019-01-03 DIAGNOSIS — D696 Thrombocytopenia, unspecified: Secondary | ICD-10-CM | POA: Diagnosis not present

## 2019-01-03 DIAGNOSIS — S72041D Displaced fracture of base of neck of right femur, subsequent encounter for closed fracture with routine healing: Secondary | ICD-10-CM | POA: Diagnosis not present

## 2019-01-03 DIAGNOSIS — F0281 Dementia in other diseases classified elsewhere with behavioral disturbance: Secondary | ICD-10-CM | POA: Diagnosis not present

## 2019-01-03 DIAGNOSIS — I10 Essential (primary) hypertension: Secondary | ICD-10-CM | POA: Diagnosis not present

## 2019-01-03 DIAGNOSIS — R2681 Unsteadiness on feet: Secondary | ICD-10-CM | POA: Diagnosis not present

## 2019-01-03 DIAGNOSIS — R0902 Hypoxemia: Secondary | ICD-10-CM | POA: Diagnosis not present

## 2019-01-03 DIAGNOSIS — Z7901 Long term (current) use of anticoagulants: Secondary | ICD-10-CM | POA: Diagnosis not present

## 2019-01-03 DIAGNOSIS — S7290XA Unspecified fracture of unspecified femur, initial encounter for closed fracture: Secondary | ICD-10-CM

## 2019-01-03 DIAGNOSIS — F0390 Unspecified dementia without behavioral disturbance: Secondary | ICD-10-CM | POA: Diagnosis not present

## 2019-01-03 DIAGNOSIS — I480 Paroxysmal atrial fibrillation: Secondary | ICD-10-CM | POA: Diagnosis not present

## 2019-01-03 DIAGNOSIS — R410 Disorientation, unspecified: Secondary | ICD-10-CM | POA: Diagnosis not present

## 2019-01-03 DIAGNOSIS — Z7401 Bed confinement status: Secondary | ICD-10-CM | POA: Diagnosis not present

## 2019-01-03 DIAGNOSIS — M25552 Pain in left hip: Secondary | ICD-10-CM | POA: Diagnosis not present

## 2019-01-03 DIAGNOSIS — M25511 Pain in right shoulder: Secondary | ICD-10-CM | POA: Diagnosis not present

## 2019-01-03 DIAGNOSIS — I495 Sick sinus syndrome: Secondary | ICD-10-CM | POA: Diagnosis not present

## 2019-01-03 DIAGNOSIS — E43 Unspecified severe protein-calorie malnutrition: Secondary | ICD-10-CM | POA: Diagnosis not present

## 2019-01-03 DIAGNOSIS — S72031D Displaced midcervical fracture of right femur, subsequent encounter for closed fracture with routine healing: Secondary | ICD-10-CM | POA: Diagnosis not present

## 2019-01-03 DIAGNOSIS — M6281 Muscle weakness (generalized): Secondary | ICD-10-CM | POA: Diagnosis not present

## 2019-01-03 DIAGNOSIS — R443 Hallucinations, unspecified: Secondary | ICD-10-CM | POA: Diagnosis not present

## 2019-01-03 DIAGNOSIS — I442 Atrioventricular block, complete: Secondary | ICD-10-CM | POA: Diagnosis not present

## 2019-01-03 DIAGNOSIS — S72001A Fracture of unspecified part of neck of right femur, initial encounter for closed fracture: Secondary | ICD-10-CM | POA: Diagnosis not present

## 2019-01-03 DIAGNOSIS — Z950 Presence of cardiac pacemaker: Secondary | ICD-10-CM | POA: Diagnosis not present

## 2019-01-03 DIAGNOSIS — S72001D Fracture of unspecified part of neck of right femur, subsequent encounter for closed fracture with routine healing: Secondary | ICD-10-CM | POA: Diagnosis not present

## 2019-01-03 DIAGNOSIS — I69354 Hemiplegia and hemiparesis following cerebral infarction affecting left non-dominant side: Secondary | ICD-10-CM | POA: Diagnosis not present

## 2019-01-03 DIAGNOSIS — T07XXXA Unspecified multiple injuries, initial encounter: Secondary | ICD-10-CM | POA: Diagnosis not present

## 2019-01-03 DIAGNOSIS — W19XXXA Unspecified fall, initial encounter: Secondary | ICD-10-CM | POA: Diagnosis not present

## 2019-01-03 DIAGNOSIS — R1310 Dysphagia, unspecified: Secondary | ICD-10-CM | POA: Diagnosis not present

## 2019-01-03 DIAGNOSIS — M2550 Pain in unspecified joint: Secondary | ICD-10-CM | POA: Diagnosis not present

## 2019-01-03 DIAGNOSIS — G3183 Dementia with Lewy bodies: Secondary | ICD-10-CM | POA: Diagnosis not present

## 2019-01-03 DIAGNOSIS — E785 Hyperlipidemia, unspecified: Secondary | ICD-10-CM | POA: Diagnosis not present

## 2019-01-03 DIAGNOSIS — R41841 Cognitive communication deficit: Secondary | ICD-10-CM | POA: Diagnosis not present

## 2019-01-03 DIAGNOSIS — Z9181 History of falling: Secondary | ICD-10-CM | POA: Diagnosis not present

## 2019-01-03 DIAGNOSIS — D649 Anemia, unspecified: Secondary | ICD-10-CM | POA: Diagnosis not present

## 2019-01-03 DIAGNOSIS — M25551 Pain in right hip: Secondary | ICD-10-CM | POA: Diagnosis not present

## 2019-01-03 LAB — BASIC METABOLIC PANEL
Anion gap: 9 (ref 5–15)
BUN: 29 mg/dL — ABNORMAL HIGH (ref 8–23)
CO2: 22 mmol/L (ref 22–32)
Calcium: 8 mg/dL — ABNORMAL LOW (ref 8.9–10.3)
Chloride: 107 mmol/L (ref 98–111)
Creatinine, Ser: 0.94 mg/dL (ref 0.44–1.00)
GFR calc Af Amer: >60 mL/min (ref >60)
GFR calc non Af Amer: 54 mL/min — ABNORMAL LOW (ref >60)
Glucose, Bld: 112 mg/dL — ABNORMAL HIGH (ref 70–99)
Potassium: 4.3 mmol/L (ref 3.5–5.1)
Sodium: 138 mmol/L (ref 135–145)

## 2019-01-03 LAB — CBC
HCT: 33.6 % — ABNORMAL LOW (ref 36.0–46.0)
Hemoglobin: 10.9 g/dL — ABNORMAL LOW (ref 12.0–15.0)
MCH: 31.9 pg (ref 26.0–34.0)
MCHC: 32.4 g/dL (ref 30.0–36.0)
MCV: 98.2 fL (ref 80.0–100.0)
Platelets: 87 10*3/uL — ABNORMAL LOW (ref 150–400)
RBC: 3.42 MIL/uL — ABNORMAL LOW (ref 3.87–5.11)
RDW: 14 % (ref 11.5–15.5)
WBC: 5.9 10*3/uL (ref 4.0–10.5)
nRBC: 0 % (ref 0.0–0.2)

## 2019-01-03 LAB — GLUCOSE, CAPILLARY: Glucose-Capillary: 103 mg/dL — ABNORMAL HIGH (ref 70–99)

## 2019-01-03 MED ORDER — ARIPIPRAZOLE 2 MG PO TABS: 2.0000 mg | ORAL_TABLET | Freq: Every day | ORAL | Status: AC

## 2019-01-03 NOTE — Progress Notes (Signed)
Pt belongings gathered and IV removed. Pt family notified of discharge and report called to SNF. AVS printed and given to [facility] along with DNR paperwork. all questions answered to satisfaction.  Jessee Newnam Elon Spanner, RN 01/03/2019 1:35 PM

## 2019-01-03 NOTE — Plan of Care (Signed)

## 2019-01-03 NOTE — Discharge Summary (Signed)
Physician Discharge Summary  Katrina Rodriguez [id number] DOB: [birth year] DOA: 12/30/2018  PCP: Katrina Ruff, MD  Admit date: 12/30/2018 Discharge date: 01/03/2019  Time spent: 35 minutes  Recommendations for Outpatient Follow-up:  1. PCP in 1 week 2. Orthopedics Dr. Lyla Rodriguez in 2 weeks   Discharge Diagnoses:  Principal Problem:   Hip fracture ([place]) Dementia   PAROXYSMAL ATRIAL FIBRILLATION   BRADYCARDIA-TACHYCARDIA SYNDROME   Complete heart block (HCC)   QT prolongation   Thrombocytopenia (HCC)   Displaced fracture of femur [facility])   Discharge Condition: Stable  Diet recommendation: Heart healthy  Filed Weights   12/30/18 0152 12/30/18 1131  Weight: 50.8 kg 50.8 kg    History of present illness:  85 year old female with history of paroxysmal atrial fibrillation on apixaban, tikosyn, complete heart block status post PPM, stroke with left hemiparesis, memory loss, delusions, hypothyroidism, thrombocytopenia, orthostatic hypotensionpresented after mechanical fall resulting in right hip pain. The patient has had gait instability since her previous stroke. She lost her balance and fell onto her right side. In the ED, x-ray showed impacted right femoral neck fracture. Ortho was consulted to assist with management  Hospital Course:   Right hip fracture secondary to mechanical fall -X-ray of right hip and pelvis showing displaced and impacted fracture of the right femoral neck. -Orthopedic Surgery consulted, underwent right hip hemiarthroplasty 10/28, Dr. Lyla Rodriguez -Restarted Eliquis  -Physical therapy  Dementia, memory loss, gait disturbance, hallucinations -Patient has been followed by Dr. Floyde Rodriguez of [facility] neurology, he has suspected Lewy body dementia according to patient's daughter Katrina Rodriguez, she was recently started on Abilify to help with agitation by Katrina Rodriguez her Neurologist, we restarted this at a lower dose  -Definitely at risk of more delirium and  confusion postop as well as at rehab -Seroquel stopped due to prolonged QTC  Paroxysmal atrial fibrillation, history of ablation, prolonged QTC -Restarted Eliquis  -Currently on Tikosyn, QTC prolonged at 511 but felt to be stable, seen by cardiology recommended to continue Tikosyn at current dose -Caution with other QT prolonging meds, Seroquel held -Continue KCl daily, with a goal to keep potassium around 4  Chronic mild thrombocytopenia -Platelet count around 100 K, slightly lower at 92,000 today -chronic dating back to 2010 -Continue to monitor  DVT prophylaxis: Eliquis Code Status: DNR   Consultants:   Orthopedic Surgery  Cardiology  Procedures: Right hip hemiarthroplasty 10/20     Discharge Exam: Vitals:   01/03/19 0333 01/03/19 0813  BP: 137/71 134/60  Pulse: 80 66  Resp: 18 17  Temp: (!) 100.5 F (38.1 C) 98.4 F (36.9 C)  SpO2: 94% 95%    General: AAOx3 Cardiovascular:S1S2/RRR Respiratory: CTAB  Discharge Instructions   Discharge Instructions    Diet - low sodium heart healthy   Complete by: As directed      Allergies as of 01/03/2019      Reactions   Amiodarone Other (See Comments)   Caused thyroid problems   Amoxicillin Hives   Aspirin Other (See Comments)   On tikosyn and warfarin   Codeine Other (See Comments)   Causes BP to drop   Darvocet [propoxyphene N-acetaminophen] Hives   Demerol Itching   Eliquis [apixaban] Nausea Only   Penicillins Hives   Did it involve swelling of the face/tongue/throat, SOB, or low BP? Unknown Did it involve sudden or severe rash/hives, skin peeling, or any reaction on the inside of your mouth or nose? Unknown Did you need to seek medical attention at a hospital or doctor's office?  Unknown When did it last happen?young adult or child?? If all above answers are "NO", may proceed with cephalosporin use.   Xarelto [rivaroxaban] Rash      Medication List    STOP taking these medications    QUEtiapine 25 MG tablet Commonly known as: SEROQUEL     TAKE these medications   acetaminophen 500 MG tablet Commonly known as: TYLENOL Take 500 mg by mouth every 6 (six) hours as needed for moderate pain.   apixaban 2.5 MG Tabs tablet Commonly known as: ELIQUIS Take 1 tablet (2.5 mg total) by mouth 2 (two) times daily.   ARIPiprazole 2 MG tablet Commonly known as: Abilify Take 1 tablet (2 mg total) by mouth daily. What changed:   medication strength  how much to take   dofetilide 250 MCG capsule Commonly known as: TIKOSYN TAKE 1 CAPSULE (250 MCG TOTAL) BY MOUTH 2 (TWO) TIMES DAILY. What changed: See the new instructions.   fludrocortisone 0.1 MG tablet Commonly known as: FLORINEF Take 1 tablet (0.1 mg total) by mouth 2 (two) times daily.   HYDROcodone-acetaminophen 5-325 MG tablet Commonly known as: NORCO/VICODIN Take 1 tablet by mouth every 6 (six) hours as needed for moderate pain.   potassium chloride SA 20 MEQ tablet Commonly known as: KLOR-CON Take 2 tablets (40 mEq total) by mouth daily. What changed:   how much to take  when to take this  reasons to take this   rosuvastatin 10 MG tablet Commonly known as: CRESTOR Take 1 tablet (10 mg total) by mouth daily.   traMADol 50 MG tablet Commonly known as: ULTRAM Take 50 mg by mouth at bedtime as needed for moderate pain.      Allergies  Allergen Reactions  . Amiodarone Other (See Comments)    Caused thyroid problems  . Amoxicillin Hives  . Aspirin Other (See Comments)    On tikosyn and warfarin  . Codeine Other (See Comments)    Causes BP to drop  . Darvocet [Propoxyphene N-Acetaminophen] Hives  . Demerol Itching  . Eliquis [Apixaban] Nausea Only  . Penicillins Hives    Did it involve swelling of the face/tongue/throat, SOB, or low BP? Unknown Did it involve sudden or severe rash/hives, skin peeling, or any reaction on the inside of your mouth or nose? Unknown Did you need to seek medical  attention at a hospital or doctor's office? Unknown When did it last happen?young adult or child?? If all above answers are "NO", may proceed with cephalosporin use.  Alveda Reasons [Rivaroxaban] Rash   Follow-up Information    Katrina Rodriguez Edelman, MD. Schedule an appointment as soon as possible for a visit in 2 weeks.   Specialty: Orthopedic Surgery Why: For wound re-check, For suture removal Contact information: [address] [place] [address] [phone]        Shea Evans At Follow up.   Specialty: [place] Why: rehab Contact information: [address] [place] [address] [phone]            The results of significant diagnostics from this hospitalization (including imaging, microbiology, ancillary and laboratory) are listed below for reference.    Significant Diagnostic Studies: Dg Chest 1 View  Result Date: 12/30/2018 CLINICAL DATA:  85 year old female with right hip fracture. EXAM: CHEST  1 VIEW COMPARISON:  Chest radiograph dated 12/03/2018 FINDINGS: Evaluation is limited due to patient's positioning. Mild eventration of the right hemidiaphragm. No focal consolidation, pleural effusion, or pneumothorax. There is mild cardiomegaly. Left  pectoral pacemaker device. Atherosclerotic calcification of the aorta. Osteopenia with degenerative changes of the spine. IMPRESSION: 1. No acute cardiopulmonary process. 2. Mild cardiomegaly. Electronically Signed   By: Anner Crete M.D.   On: 12/30/2018 02:48   Dg Elbow Complete Right  Result Date: 12/30/2018 CLINICAL DATA:  85 year old female with fall and right elbow pain. EXAM: RIGHT ELBOW - COMPLETE 3+ VIEW COMPARISON:  None. FINDINGS: No acute fracture or dislocation. The bones are osteopenic. No significant arthritic changes. No joint effusion. The soft tissues are unremarkable. IMPRESSION: No acute fracture or dislocation. Electronically Signed   By: Anner Crete  M.D.   On: 12/30/2018 02:50   Dg Knee 1-2 Views Right  Result Date: 12/30/2018 CLINICAL DATA:  Right hip fracture. EXAM: RIGHT KNEE - 1-2 VIEW COMPARISON:  None. FINDINGS: No evidence of fracture, dislocation, or joint effusion. No evidence of arthropathy or other focal bone abnormality. Soft tissues are unremarkable. IMPRESSION: Negative. Electronically Signed   By: Marijo Conception M.D.   On: 12/30/2018 10:29   Ct Head Wo Contrast  Result Date: 12/30/2018 CLINICAL DATA:  Recent fall EXAM: CT HEAD WITHOUT CONTRAST TECHNIQUE: Contiguous axial images were obtained from the base of the skull through the vertex without intravenous contrast. COMPARISON:  12/13/2018 FINDINGS: Brain: Chronic atrophic and ischemic changes are noted. No findings to suggest acute hemorrhage, acute infarction or space-occupying mass lesion are noted. Vascular: No hyperdense vessel or unexpected calcification. Skull: Normal. Negative for fracture or focal lesion. Sinuses/Orbits: No acute finding. Other: None. IMPRESSION: Chronic atrophic and ischemic changes without acute abnormality. Electronically Signed   By: Katrina Rodriguez M.D.   On: 12/30/2018 03:07   Ct Head Wo Contrast  Result Date: 12/13/2018 CLINICAL DATA:  Syncope. EXAM: CT HEAD WITHOUT CONTRAST CT CERVICAL SPINE WITHOUT CONTRAST TECHNIQUE: Multidetector CT imaging of the head and cervical spine was performed following the standard protocol without intravenous contrast. Multiplanar CT image reconstructions of the cervical spine were also generated. COMPARISON:  CT head and cervical spine dated November 28, 2018. FINDINGS: CT HEAD FINDINGS Brain: No evidence of acute infarction, hemorrhage, hydrocephalus, extra-axial collection or mass lesion/mass effect. Stable atrophy and chronic microvascular ischemic changes. Vascular: Atherosclerotic vascular calcification of the carotid siphons. No hyperdense vessel. Skull: Normal. Negative for fracture or focal lesion.  Sinuses/Orbits: No acute finding. Stable hyperdensity of the left globe likely related to prior intra-ocular silicone injection. Other: None. CT CERVICAL SPINE FINDINGS Alignment: Normal. Skull base and vertebrae: No acute fracture. No primary bone lesion or focal pathologic process. Soft tissues and spinal canal: No prevertebral fluid or swelling. No visible canal hematoma. Disc levels: Stable diffuse cervical spondylosis with advanced right-sided neuroforaminal stenosis at C4-C5 and C5-C6 due to uncovertebral hypertrophy. Upper chest: Negative. Other: None. IMPRESSION: 1.  No acute intracranial abnormality. 2.  No acute cervical spine fracture. Electronically Signed   By: Titus Dubin M.D.   On: 12/13/2018 22:54   Ct Cervical Spine Wo Contrast  Result Date: 12/13/2018 CLINICAL DATA:  Syncope. EXAM: CT HEAD WITHOUT CONTRAST CT CERVICAL SPINE WITHOUT CONTRAST TECHNIQUE: Multidetector CT imaging of the head and cervical spine was performed following the standard protocol without intravenous contrast. Multiplanar CT image reconstructions of the cervical spine were also generated. COMPARISON:  CT head and cervical spine dated November 28, 2018. FINDINGS: CT HEAD FINDINGS Brain: No evidence of acute infarction, hemorrhage, hydrocephalus, extra-axial collection or mass lesion/mass effect. Stable atrophy and chronic microvascular ischemic changes. Vascular: Atherosclerotic vascular calcification of the carotid siphons.  No hyperdense vessel. Skull: Normal. Negative for fracture or focal lesion. Sinuses/Orbits: No acute finding. Stable hyperdensity of the left globe likely related to prior intra-ocular silicone injection. Other: None. CT CERVICAL SPINE FINDINGS Alignment: Normal. Skull base and vertebrae: No acute fracture. No primary bone lesion or focal pathologic process. Soft tissues and spinal canal: No prevertebral fluid or swelling. No visible canal hematoma. Disc levels: Stable diffuse cervical spondylosis  with advanced right-sided neuroforaminal stenosis at C4-C5 and C5-C6 due to uncovertebral hypertrophy. Upper chest: Negative. Other: None. IMPRESSION: 1.  No acute intracranial abnormality. 2.  No acute cervical spine fracture. Electronically Signed   By: Titus Dubin M.D.   On: 12/13/2018 22:54   Dg Pelvis Portable  Result Date: 01/01/2019 CLINICAL DATA:  Status post right hip replacement EXAM: PORTABLE PELVIS 1 VIEWS COMPARISON:  Intraoperative films from earlier in the same day. FINDINGS: Right hip replacement is noted in satisfactory position. No acute bony or soft tissue abnormality is noted. IMPRESSION: Status post right hip replacement Electronically Signed   By: Katrina Rodriguez M.D.   On: 01/01/2019 21:48   Dg C-arm 1-60 Min  Result Date: 01/01/2019 CLINICAL DATA:  Right hip replacement EXAM: OPERATIVE RIGHT HIP WITH PELVIS; DG C-ARM 1-60 MIN COMPARISON:  None. FLUOROSCOPY TIME:  Radiation Exposure Index (as provided by the fluoroscopic device): Not available If the device does not provide the exposure index: Fluoroscopy Time:  6 seconds Number of Acquired Images:  2 FINDINGS: Right hip replacement is noted. No soft tissue abnormality or bony abnormality is seen. IMPRESSION: Status post right hip replacement. Electronically Signed   By: Katrina Rodriguez M.D.   On: 01/01/2019 20:17   Dg Hip Operative Unilat W Or W/o Pelvis Left  Result Date: 01/01/2019 CLINICAL DATA:  Right hip replacement EXAM: OPERATIVE RIGHT HIP WITH PELVIS; DG C-ARM 1-60 MIN COMPARISON:  None. FLUOROSCOPY TIME:  Radiation Exposure Index (as provided by the fluoroscopic device): Not available If the device does not provide the exposure index: Fluoroscopy Time:  6 seconds Number of Acquired Images:  2 FINDINGS: Right hip replacement is noted. No soft tissue abnormality or bony abnormality is seen. IMPRESSION: Status post right hip replacement. Electronically Signed   By: Katrina Rodriguez M.D.   On: 01/01/2019 20:17   Dg Hip Unilat  W Or Wo Pelvis 2-3 Views Right  Result Date: 12/30/2018 CLINICAL DATA:  85 year old female with fall and right hip pain. EXAM: DG HIP (WITH OR WITHOUT PELVIS) 2-3V RIGHT COMPARISON:  Pelvic radiograph dated 06/01/2018 FINDINGS: There is a displaced fracture of the right femoral neck with proximal migration of the femoral shaft and impaction. No dislocation. The bones are osteopenic. The soft tissues are unremarkable. IMPRESSION: Displaced and impacted fracture of the right femoral neck. Electronically Signed   By: Anner Crete M.D.   On: 12/30/2018 02:47    Microbiology: Recent Results (from the past 240 hour(s))  SARS CORONAVIRUS 2 (TAT 6-24 HRS) Nasopharyngeal Nasopharyngeal Swab     Status: None   Collection Time: 12/30/18  2:05 AM   Specimen: Nasopharyngeal Swab  Result Value Ref Range Status   SARS Coronavirus 2 NEGATIVE NEGATIVE Final    Comment: (NOTE) SARS-CoV-2 target nucleic acids are NOT DETECTED. The SARS-CoV-2 RNA is generally detectable in upper and lower respiratory specimens during the acute phase of infection. Negative results do not preclude SARS-CoV-2 infection, do not rule out co-infections with other pathogens, and should not be used as the sole basis for treatment or other patient  management decisions. Negative results must be combined with clinical observations, patient history, and epidemiological information. The expected result is Negative. Fact Sheet for Patients: SugarRoll.be Fact Sheet for Healthcare Providers: https://www.woods-mathews.com/ This test is not yet approved or cleared by the [place] FDA and  has been authorized for detection and/or diagnosis of SARS-CoV-2 by FDA under an Emergency Use Authorization (EUA). This EUA will remain  in effect (meaning this test can be used) for the duration of the COVID-19 declaration under Section 56 4(b)(1) of the Act, 21 U.S.C. section 360bbb-3(b)(1), unless the  authorization is terminated or revoked sooner. Performed at [facility], [place] [address], [place], [place] [address]   Surgical PCR screen     Status: None   Collection Time: 12/30/18 12:46 PM   Specimen: Nasal Mucosa; Nasal Swab  Result Value Ref Range Status   MRSA, PCR NEGATIVE NEGATIVE Final   Staphylococcus aureus NEGATIVE NEGATIVE Final    Comment: (NOTE) The Xpert SA Assay (FDA approved for NASAL specimens in patients 83 years of age and older), is one component of a comprehensive surveillance program. It is not intended to diagnose infection nor to guide or monitor treatment. Performed at [facility], [place] [address], [place], [place] [address]   SARS CORONAVIRUS 2 (TAT 6-24 HRS) Nasopharyngeal Nasopharyngeal Swab     Status: None   Collection Time: 01/02/19  3:24 PM   Specimen: Nasopharyngeal Swab  Result Value Ref Range Status   SARS Coronavirus 2 NEGATIVE NEGATIVE Final    Comment: (NOTE) SARS-CoV-2 target nucleic acids are NOT DETECTED. The SARS-CoV-2 RNA is generally detectable in upper and lower respiratory specimens during the acute phase of infection. Negative results do not preclude SARS-CoV-2 infection, do not rule out co-infections with other pathogens, and should not be used as the sole basis for treatment or other patient management decisions. Negative results must be combined with clinical observations, patient history, and epidemiological information. The expected result is Negative. Fact Sheet for Patients: SugarRoll.be Fact Sheet for Healthcare Providers: https://www.woods-mathews.com/ This test is not yet approved or cleared by the [place] FDA and  has been authorized for detection and/or diagnosis of SARS-CoV-2 by FDA under an Emergency Use Authorization (EUA). This EUA will remain  in effect (meaning this test can be used) for the duration of the COVID-19 declaration under Section  56 4(b)(1) of the Act, 21 U.S.C. section 360bbb-3(b)(1), unless the authorization is terminated or revoked sooner. Performed at [facility], [place] [address], [place], [place] [address]      Labs: Basic Metabolic Panel: Recent Labs  Lab 12/30/18 0220 12/30/18 1100 12/31/18 0349 01/01/19 0238 01/02/19 0410 01/03/19 0409  NA 139  --  138 139 139 138  K 3.7  --  3.8 3.4* 3.9 4.3  CL 105  --  106 104 106 107  CO2 24  --  23 24 22 22   GLUCOSE 112*  --  120* 105* 117* 112*  BUN 33*  --  26* 25* 31* 29*  CREATININE 1.20*  --  1.10* 1.07* 1.07* 0.94  CALCIUM 8.6*  --  8.7* 8.6* 8.0* 8.0*  MG  --  2.2  --   --   --   --    Liver Function Tests: Recent Labs  Lab 01/01/19 0238  AST 37  ALT 22  ALKPHOS 74  BILITOT 1.2  PROT 6.4*  ALBUMIN 3.6   No results for input(s): LIPASE, AMYLASE in the last 168  hours. No results for input(s): AMMONIA in the last 168 hours. CBC: Recent Labs  Lab 12/30/18 0220 12/30/18 0840 12/31/18 0349 01/01/19 0238 01/02/19 0410 01/03/19 0409  WBC 4.3 8.0 6.9 7.9 7.5 5.9  NEUTROABS 3.0  --   --   --   --   --   HGB 12.3 11.9* 12.0 14.1 11.8* 10.9*  HCT 37.4 36.4 36.1 43.3 35.6* 33.6*  MCV 97.9 97.1 96.8 98.0 95.7 98.2  PLT 126* 109* 101* 102* 92* 87*   Cardiac Enzymes: No results for input(s): CKTOTAL, CKMB, CKMBINDEX, TROPONINI in the last 168 hours. BNP: BNP (last 3 results) No results for input(s): BNP in the last 8760 hours.  ProBNP (last 3 results) No results for input(s): PROBNP in the last 8760 hours.  CBG: Recent Labs  Lab 12/30/18 0525 01/01/19 2108 01/02/19 0706 01/02/19 2110 01/03/19 0657  GLUCAP 111* 120* 125* 109* 103*       Signed:  Domenic Polite MD.  [facility] 01/03/2019, 11:09 AM

## 2019-01-03 NOTE — Consult Note (Signed)
   [facility] CM Inpatient Consult   01/03/2019  Katrina Rodriguez January 07, 1929 [id number]    Patient screened as a benefit from her Medicare/ Next[employer] ACO plan and for potential [facility] program for risk of unplanned readmission and hospitalization.   Review of medical record show that patient is an 85 y/o female admitted after falling in which she sustained a R hip fracture, now status post hemiarthroplasty of R hip. Patient lived at home alone, blind and [place].  PT/OT evaluation completed with skilled nursing facility recommendation.  Per transition of care CM note, expected discharge is skilled nursing facility with preference of [facility]. No identifiable [facility] Care Management needs at this time.  [facility] post acute RN coordinator will be made aware of patient's disposition to any facility covered by [facility] care management for post discharge follow up of needs.   For questions and additional information, please contact:  Ysidro Ramsay A. Abisai Deer, BSN, RN-BC [facility] Liaison Cell: [phone]

## 2019-01-03 NOTE — TOC Transition Note (Addendum)
Transition of Care [facility]) - CM/SW Discharge Note   Patient Details  Name: Katrina Rodriguez MRN: [id number] Date of Birth: 1928-09-19  Transition [facility]) CM/SW Contact:  Midge Minium RN, BSN, NCM-BC, ACM-RN [phone] (working remotely) Phone Number: 01/03/2019, 10:51 AM   Clinical Narrative:    Patient is medically stable to transition to [facility] SN[facility], per Dr. Broadus John. CM has updated the patients daughter, [name]; daughter is agreeable to transfer. PTAR will be arrange for 1300.  Please call report to: [phone]; assigned room: 7011   Final next level of care: [place] Barriers to Discharge: No Barriers Identified   Patient Goals and CMS Choice Patient states their goals for this hospitalization and ongoing recovery are:: "unable to state"([place]) CMS Medicare.gov Compare Post Acute Care list provided to:: Patient Represenative (must comment) Choice offered to / list presented to : Adult Children(Betty [place])  Discharge Placement              Patient chooses bed at: [facility] Patient to be transferred to facility by: [facility] Name of family member notified: [name] (daughter) Patient and family notified of of transfer: 01/03/19  Discharge Plan and Services In-house Referral: Clinical Social Work Discharge Planning Services: CM Consult Post Acute Care Choice: [place]          DME Arranged: N/A DME Agency: NA       HH Arranged: NA HH Agency: NA        Social Determinants of Health (SDOH) Interventions     Readmission Risk Interventions No flowsheet data found.

## 2019-01-06 DIAGNOSIS — S72001D Fracture of unspecified part of neck of right femur, subsequent encounter for closed fracture with routine healing: Secondary | ICD-10-CM | POA: Diagnosis not present

## 2019-01-06 DIAGNOSIS — E785 Hyperlipidemia, unspecified: Secondary | ICD-10-CM | POA: Diagnosis not present

## 2019-01-06 DIAGNOSIS — F0281 Dementia in other diseases classified elsewhere with behavioral disturbance: Secondary | ICD-10-CM | POA: Diagnosis not present

## 2019-01-06 DIAGNOSIS — G3183 Dementia with Lewy bodies: Secondary | ICD-10-CM | POA: Diagnosis not present

## 2019-01-06 DIAGNOSIS — I480 Paroxysmal atrial fibrillation: Secondary | ICD-10-CM | POA: Diagnosis not present

## 2019-01-17 DIAGNOSIS — S72031D Displaced midcervical fracture of right femur, subsequent encounter for closed fracture with routine healing: Secondary | ICD-10-CM | POA: Diagnosis not present

## 2019-01-21 DIAGNOSIS — F0390 Unspecified dementia without behavioral disturbance: Secondary | ICD-10-CM | POA: Diagnosis not present

## 2019-01-21 DIAGNOSIS — L309 Dermatitis, unspecified: Secondary | ICD-10-CM | POA: Diagnosis not present

## 2019-02-09 ENCOUNTER — Other Ambulatory Visit: Payer: Self-pay | Admitting: Physician Assistant

## 2019-03-04 DIAGNOSIS — Z23 Encounter for immunization: Secondary | ICD-10-CM | POA: Diagnosis not present

## 2019-03-21 DIAGNOSIS — G3183 Dementia with Lewy bodies: Secondary | ICD-10-CM | POA: Diagnosis not present

## 2019-03-21 DIAGNOSIS — I482 Chronic atrial fibrillation, unspecified: Secondary | ICD-10-CM | POA: Diagnosis not present

## 2019-03-21 DIAGNOSIS — F0280 Dementia in other diseases classified elsewhere without behavioral disturbance: Secondary | ICD-10-CM | POA: Diagnosis not present

## 2019-03-21 DIAGNOSIS — I959 Hypotension, unspecified: Secondary | ICD-10-CM | POA: Diagnosis not present

## 2019-03-21 DIAGNOSIS — H548 Legal blindness, as defined in USA: Secondary | ICD-10-CM | POA: Diagnosis not present

## 2019-03-25 ENCOUNTER — Ambulatory Visit (INDEPENDENT_AMBULATORY_CARE_PROVIDER_SITE_OTHER): Payer: Medicare Other | Admitting: *Deleted

## 2019-03-25 DIAGNOSIS — I495 Sick sinus syndrome: Secondary | ICD-10-CM | POA: Diagnosis not present

## 2019-03-26 LAB — CUP PACEART REMOTE DEVICE CHECK
Battery Impedance: 1191 Ohm
Battery Remaining Longevity: 57 mo
Battery Voltage: 2.77 V
Brady Statistic AP VP Percent: 0 %
Brady Statistic AP VS Percent: 50 %
Brady Statistic AS VP Percent: 0 %
Brady Statistic AS VS Percent: 50 %
Date Time Interrogation Session: 20210120143414
Implantable Lead Implant Date: 20040309
Implantable Lead Implant Date: 20040309
Implantable Lead Location: 753859
Implantable Lead Location: 753860
Implantable Lead Model: 4458
Implantable Lead Serial Number: 304362
Implantable Pulse Generator Implant Date: 20140409
Lead Channel Impedance Value: 598 Ohm
Lead Channel Impedance Value: 993 Ohm
Lead Channel Pacing Threshold Amplitude: 0.5 V
Lead Channel Pacing Threshold Amplitude: 0.5 V
Lead Channel Pacing Threshold Pulse Width: 0.4 ms
Lead Channel Pacing Threshold Pulse Width: 0.4 ms
Lead Channel Setting Pacing Amplitude: 2 V
Lead Channel Setting Pacing Amplitude: 2.5 V
Lead Channel Setting Pacing Pulse Width: 0.4 ms
Lead Channel Setting Sensing Sensitivity: 5.6 mV

## 2019-03-31 DIAGNOSIS — I4819 Other persistent atrial fibrillation: Secondary | ICD-10-CM | POA: Diagnosis not present

## 2019-03-31 DIAGNOSIS — I455 Other specified heart block: Secondary | ICD-10-CM | POA: Diagnosis not present

## 2019-04-01 DIAGNOSIS — Z23 Encounter for immunization: Secondary | ICD-10-CM | POA: Diagnosis not present

## 2019-04-08 DIAGNOSIS — Z87898 Personal history of other specified conditions: Secondary | ICD-10-CM | POA: Diagnosis not present

## 2019-04-08 DIAGNOSIS — F0391 Unspecified dementia with behavioral disturbance: Secondary | ICD-10-CM | POA: Diagnosis not present

## 2019-05-12 DIAGNOSIS — H548 Legal blindness, as defined in USA: Secondary | ICD-10-CM | POA: Diagnosis not present

## 2019-05-12 DIAGNOSIS — I4891 Unspecified atrial fibrillation: Secondary | ICD-10-CM | POA: Diagnosis not present

## 2019-05-12 DIAGNOSIS — D696 Thrombocytopenia, unspecified: Secondary | ICD-10-CM | POA: Diagnosis not present

## 2019-05-12 DIAGNOSIS — I959 Hypotension, unspecified: Secondary | ICD-10-CM | POA: Diagnosis not present

## 2019-05-12 DIAGNOSIS — E785 Hyperlipidemia, unspecified: Secondary | ICD-10-CM | POA: Diagnosis not present

## 2019-05-12 DIAGNOSIS — F0281 Dementia in other diseases classified elsewhere with behavioral disturbance: Secondary | ICD-10-CM | POA: Diagnosis not present

## 2019-05-12 DIAGNOSIS — R296 Repeated falls: Secondary | ICD-10-CM | POA: Diagnosis not present

## 2019-05-12 DIAGNOSIS — G3183 Dementia with Lewy bodies: Secondary | ICD-10-CM | POA: Diagnosis not present

## 2019-05-12 DIAGNOSIS — I459 Conduction disorder, unspecified: Secondary | ICD-10-CM | POA: Diagnosis not present

## 2019-05-13 DIAGNOSIS — F0391 Unspecified dementia with behavioral disturbance: Secondary | ICD-10-CM | POA: Diagnosis not present

## 2019-05-13 DIAGNOSIS — F29 Unspecified psychosis not due to a substance or known physiological condition: Secondary | ICD-10-CM | POA: Diagnosis not present

## 2019-05-22 ENCOUNTER — Ambulatory Visit: Payer: Medicare Other | Admitting: Neurology

## 2019-05-22 ENCOUNTER — Telehealth: Payer: Self-pay | Admitting: Neurology

## 2019-05-22 NOTE — Telephone Encounter (Signed)
This patient did not show for a revisit appointment today. 

## 2019-05-27 ENCOUNTER — Encounter: Payer: Self-pay | Admitting: Neurology

## 2019-05-27 DIAGNOSIS — Z87898 Personal history of other specified conditions: Secondary | ICD-10-CM | POA: Diagnosis not present

## 2019-05-27 DIAGNOSIS — F0391 Unspecified dementia with behavioral disturbance: Secondary | ICD-10-CM | POA: Diagnosis not present

## 2019-05-27 DIAGNOSIS — F29 Unspecified psychosis not due to a substance or known physiological condition: Secondary | ICD-10-CM | POA: Diagnosis not present

## 2019-06-06 ENCOUNTER — Telehealth: Payer: Self-pay | Admitting: Cardiology

## 2019-06-06 NOTE — Telephone Encounter (Signed)
I have attempted to contact patient multiple times- have been unable to get through- number is stating can not be completed as dialed.   I have already tried to recall. Will continue to try.

## 2019-06-06 NOTE — Telephone Encounter (Signed)
New message     Pt c/o medication issue:  1. Name of Medication: tikosyn  2. How are you currently taking this medication (dosage and times per day)? 250mg  1 capsule twice a day  3. Are you having a reaction (difficulty breathing--STAT)? no  4. What is your medication issue? Patient states that for the last 3 days when she took her tikosyn, the capsule exploded in her throat and "hot crystals--hotter than hot peppers" was in her mouth.  She is afraid to take more medication until she talks to the nurse.  Please call

## 2019-07-01 DIAGNOSIS — F29 Unspecified psychosis not due to a substance or known physiological condition: Secondary | ICD-10-CM | POA: Diagnosis not present

## 2019-07-01 DIAGNOSIS — F0391 Unspecified dementia with behavioral disturbance: Secondary | ICD-10-CM | POA: Diagnosis not present

## 2019-07-01 DIAGNOSIS — Z87898 Personal history of other specified conditions: Secondary | ICD-10-CM | POA: Diagnosis not present

## 2019-07-01 DIAGNOSIS — G40A09 Absence epileptic syndrome, not intractable, without status epilepticus: Secondary | ICD-10-CM | POA: Diagnosis not present

## 2019-07-08 DIAGNOSIS — I4819 Other persistent atrial fibrillation: Secondary | ICD-10-CM | POA: Diagnosis not present

## 2019-07-08 DIAGNOSIS — E039 Hypothyroidism, unspecified: Secondary | ICD-10-CM | POA: Diagnosis not present

## 2019-07-08 DIAGNOSIS — R443 Hallucinations, unspecified: Secondary | ICD-10-CM | POA: Diagnosis not present

## 2019-07-08 DIAGNOSIS — I69354 Hemiplegia and hemiparesis following cerebral infarction affecting left non-dominant side: Secondary | ICD-10-CM | POA: Diagnosis not present

## 2019-07-08 DIAGNOSIS — D696 Thrombocytopenia, unspecified: Secondary | ICD-10-CM | POA: Diagnosis not present

## 2019-07-14 DIAGNOSIS — H548 Legal blindness, as defined in USA: Secondary | ICD-10-CM | POA: Diagnosis not present

## 2019-07-14 DIAGNOSIS — F0281 Dementia in other diseases classified elsewhere with behavioral disturbance: Secondary | ICD-10-CM | POA: Diagnosis not present

## 2019-07-14 DIAGNOSIS — G3183 Dementia with Lewy bodies: Secondary | ICD-10-CM | POA: Diagnosis not present

## 2019-07-14 DIAGNOSIS — E46 Unspecified protein-calorie malnutrition: Secondary | ICD-10-CM | POA: Diagnosis not present

## 2019-07-14 DIAGNOSIS — I482 Chronic atrial fibrillation, unspecified: Secondary | ICD-10-CM | POA: Diagnosis not present

## 2019-08-12 DIAGNOSIS — Z87898 Personal history of other specified conditions: Secondary | ICD-10-CM | POA: Diagnosis not present

## 2019-08-12 DIAGNOSIS — F29 Unspecified psychosis not due to a substance or known physiological condition: Secondary | ICD-10-CM | POA: Diagnosis not present

## 2019-08-12 DIAGNOSIS — F0391 Unspecified dementia with behavioral disturbance: Secondary | ICD-10-CM | POA: Diagnosis not present

## 2019-08-21 ENCOUNTER — Telehealth: Payer: Self-pay | Admitting: Cardiology

## 2019-08-21 NOTE — Telephone Encounter (Signed)
   [facility] called. The patient has been in the [facility] since Christmas but the Charge Nurse wanted to speak with someone from the [facility] about her remote transmissions. The number provided is a direct number for Ria Comment, the Camera operator for the [employer]. If she is not available the other Nurse can be reached by calling [phone]

## 2019-08-21 NOTE — Telephone Encounter (Signed)
Attempted to call Ria Comment back, no answer, left VM with Device clinic phone # and hours to return call.    Pt last transmission received was 03/26/19.  The next scheduled transmission is 09/23/19 (there was a missed check 4/20)

## 2019-08-22 NOTE — Telephone Encounter (Signed)
Left message on alternate phone number provided with Device clinic phone # and hours to callback.

## 2019-08-26 NOTE — Telephone Encounter (Signed)
I spoke with Katrina Rodriguez to let her know the pt monitor is not automatic. They will have to send a manual transmission. I sent her a copy of instructions to do the transmission. I let her know the pt next schedule date to send is 09-23-2019.

## 2019-09-16 DIAGNOSIS — Z9181 History of falling: Secondary | ICD-10-CM | POA: Diagnosis not present

## 2019-09-16 DIAGNOSIS — E46 Unspecified protein-calorie malnutrition: Secondary | ICD-10-CM | POA: Diagnosis not present

## 2019-09-16 DIAGNOSIS — I959 Hypotension, unspecified: Secondary | ICD-10-CM | POA: Diagnosis not present

## 2019-09-16 DIAGNOSIS — F329 Major depressive disorder, single episode, unspecified: Secondary | ICD-10-CM | POA: Diagnosis not present

## 2019-09-16 DIAGNOSIS — H548 Legal blindness, as defined in USA: Secondary | ICD-10-CM | POA: Diagnosis not present

## 2019-09-16 DIAGNOSIS — I4891 Unspecified atrial fibrillation: Secondary | ICD-10-CM | POA: Diagnosis not present

## 2019-09-16 DIAGNOSIS — E785 Hyperlipidemia, unspecified: Secondary | ICD-10-CM | POA: Diagnosis not present

## 2019-09-16 DIAGNOSIS — R1310 Dysphagia, unspecified: Secondary | ICD-10-CM | POA: Diagnosis not present

## 2019-09-16 DIAGNOSIS — D693 Immune thrombocytopenic purpura: Secondary | ICD-10-CM | POA: Diagnosis not present

## 2019-09-16 DIAGNOSIS — F0280 Dementia in other diseases classified elsewhere without behavioral disturbance: Secondary | ICD-10-CM | POA: Diagnosis not present

## 2019-09-16 DIAGNOSIS — G3183 Dementia with Lewy bodies: Secondary | ICD-10-CM | POA: Diagnosis not present

## 2019-09-23 ENCOUNTER — Ambulatory Visit (INDEPENDENT_AMBULATORY_CARE_PROVIDER_SITE_OTHER): Payer: Medicare Other | Admitting: *Deleted

## 2019-09-23 DIAGNOSIS — I495 Sick sinus syndrome: Secondary | ICD-10-CM | POA: Diagnosis not present

## 2019-09-25 ENCOUNTER — Telehealth: Payer: Self-pay | Admitting: Cardiology

## 2019-09-25 LAB — CUP PACEART REMOTE DEVICE CHECK
Battery Impedance: 1384 Ohm
Battery Remaining Longevity: 51 mo
Battery Voltage: 2.76 V
Brady Statistic AP VP Percent: 0 %
Brady Statistic AP VS Percent: 54 %
Brady Statistic AS VP Percent: 0 %
Brady Statistic AS VS Percent: 46 %
Date Time Interrogation Session: 20210722143416
Implantable Lead Implant Date: 20040309
Implantable Lead Implant Date: 20040309
Implantable Lead Location: 753859
Implantable Lead Location: 753860
Implantable Lead Model: 4458
Implantable Lead Serial Number: 304362
Implantable Pulse Generator Implant Date: 20140409
Lead Channel Impedance Value: 1019 Ohm
Lead Channel Impedance Value: 584 Ohm
Lead Channel Pacing Threshold Amplitude: 0.5 V
Lead Channel Pacing Threshold Amplitude: 0.625 V
Lead Channel Pacing Threshold Pulse Width: 0.4 ms
Lead Channel Pacing Threshold Pulse Width: 0.4 ms
Lead Channel Setting Pacing Amplitude: 2 V
Lead Channel Setting Pacing Amplitude: 2.5 V
Lead Channel Setting Pacing Pulse Width: 0.4 ms
Lead Channel Setting Sensing Sensitivity: 5.6 mV

## 2019-09-25 NOTE — Telephone Encounter (Signed)
Pt c/o medication issue:  1. Name of Medication: dofetilide (TIKOSYN) 250 MCG capsule  2. How are you currently taking this medication (dosage and times per day)? 250 mcg q12h  3. Are you having a reaction (difficulty breathing--STAT)? no  4. What is your medication issue? [facility] wants to reduce the dosage of this patients medication because it has been slowing down the patients HR into the 50's. The NH is also doing blood work and the patient's CRCL is 28.22 and the Pharmacy recommends lowering the dosage based on the blood levels.  The NH wanted to confirm with Dr,. Jordon before they change the dose . Dr. Doug Sou office can speak with any nurse at [facility]. They will all be aware of the situation

## 2019-09-25 NOTE — Telephone Encounter (Signed)
This needs to be addressed by Dr Rayann Heman

## 2019-09-25 NOTE — Telephone Encounter (Signed)
Received a call from [place] from [facility]. She report the PA is requesting Dr. [name] to be aware of a pharmacy alert they received in regards to pt's tikosyn dose. She report the fax stated pt's CRCL is 28.22 ml/min using the cockcrost and gault equation. They pharmacy noted based on pt's weight and age, pt's number should be between 20-39 and medication dose 125 mcg versus 250 mcg. The PA also wanted to note the pt's HR occasionally drops to 50 and has a HX of prolonged QT.   LPN voiced she will fax over pharmacy alert for Dr. [name] to review.

## 2019-09-26 NOTE — Telephone Encounter (Signed)
Returned call to [place] with [facility].Left message on personal voice Dr.Jordan's advice.

## 2019-09-26 NOTE — Progress Notes (Signed)
Remote pacemaker transmission.   

## 2019-09-28 NOTE — Telephone Encounter (Signed)
Remotes from pacemaker are reviewed and reveal normal device function.  Reassure the team at [facility] that we do not need to worry about bradycardia.   Do not hold medicines for bradycardia.  She is overdue for follow-up of her tikosyn.  Ideally, she would come to the AF clinic for ekg and further consideration of tikosyn dosing.  I see that she has dementia.  If this is mild to moderate then we should have her come to the afib clinic.  If her dementia prevents her from coming to the office, please let me know and I can follow-up with the folks at [facility] directly.  I typically would not reduce her dose unless her QT has lengthened.

## 2019-09-29 NOTE — Telephone Encounter (Signed)
Notified [facility] of Dr. Bonita Quin recommendations about an Afib Clinic visit and not holding medications related to bradycardia.   Sending to Afib Clinic to set up an appt.

## 2019-10-08 ENCOUNTER — Ambulatory Visit (HOSPITAL_COMMUNITY): Payer: Medicare Other | Admitting: Physician Assistant

## 2019-10-15 ENCOUNTER — Inpatient Hospital Stay (HOSPITAL_COMMUNITY)
Admission: RE | Admit: 2019-10-15 | Discharge: 2019-10-15 | Disposition: A | Discharge status: Home / Self Care | Payer: Medicare Other | Source: Ambulatory Visit | Attending: Physician Assistant | Admitting: Physician Assistant

## 2019-10-15 DIAGNOSIS — I442 Atrioventricular block, complete: Secondary | ICD-10-CM | POA: Diagnosis not present

## 2019-10-15 DIAGNOSIS — I69354 Hemiplegia and hemiparesis following cerebral infarction affecting left non-dominant side: Secondary | ICD-10-CM | POA: Diagnosis not present

## 2019-10-15 DIAGNOSIS — Z8616 Personal history of COVID-19: Secondary | ICD-10-CM | POA: Diagnosis not present

## 2019-10-15 DIAGNOSIS — E039 Hypothyroidism, unspecified: Secondary | ICD-10-CM | POA: Diagnosis not present

## 2019-10-15 DIAGNOSIS — Z950 Presence of cardiac pacemaker: Secondary | ICD-10-CM | POA: Diagnosis not present

## 2019-10-15 DIAGNOSIS — D696 Thrombocytopenia, unspecified: Secondary | ICD-10-CM | POA: Diagnosis not present

## 2019-10-15 DIAGNOSIS — I480 Paroxysmal atrial fibrillation: Secondary | ICD-10-CM | POA: Diagnosis not present

## 2019-10-15 DIAGNOSIS — R443 Hallucinations, unspecified: Secondary | ICD-10-CM | POA: Diagnosis not present

## 2019-10-15 DIAGNOSIS — F0391 Unspecified dementia with behavioral disturbance: Secondary | ICD-10-CM | POA: Diagnosis not present

## 2019-10-15 DIAGNOSIS — Z9181 History of falling: Secondary | ICD-10-CM | POA: Diagnosis not present

## 2019-10-15 DIAGNOSIS — F0390 Unspecified dementia without behavioral disturbance: Secondary | ICD-10-CM | POA: Diagnosis not present

## 2019-10-15 DIAGNOSIS — E43 Unspecified severe protein-calorie malnutrition: Secondary | ICD-10-CM | POA: Diagnosis not present

## 2019-10-15 DIAGNOSIS — U071 COVID-19: Secondary | ICD-10-CM | POA: Diagnosis not present

## 2019-10-15 DIAGNOSIS — I495 Sick sinus syndrome: Secondary | ICD-10-CM | POA: Diagnosis not present

## 2019-10-15 DIAGNOSIS — Z7901 Long term (current) use of anticoagulants: Secondary | ICD-10-CM | POA: Diagnosis not present

## 2019-10-16 ENCOUNTER — Telehealth (HOSPITAL_COMMUNITY): Payer: Self-pay | Admitting: Cardiology

## 2019-10-17 DIAGNOSIS — F0391 Unspecified dementia with behavioral disturbance: Secondary | ICD-10-CM | POA: Diagnosis not present

## 2019-10-17 DIAGNOSIS — U071 COVID-19: Secondary | ICD-10-CM | POA: Diagnosis not present

## 2019-10-20 NOTE — Telephone Encounter (Signed)
Encounter open in error 

## 2019-10-30 ENCOUNTER — Ambulatory Visit (HOSPITAL_COMMUNITY): Payer: Medicare Other | Admitting: Physician Assistant

## 2019-11-03 ENCOUNTER — Other Ambulatory Visit: Payer: Self-pay

## 2019-11-03 ENCOUNTER — Ambulatory Visit (HOSPITAL_COMMUNITY)
Admission: RE | Admit: 2019-11-03 | Discharge: 2019-11-03 | Disposition: A | Discharge status: Home / Self Care | Payer: Medicare Other | Source: Ambulatory Visit | Attending: Physician Assistant | Admitting: Physician Assistant

## 2019-11-03 ENCOUNTER — Encounter (HOSPITAL_COMMUNITY): Payer: Self-pay | Admitting: Physician Assistant

## 2019-11-03 VITALS — BP 132/64 | HR 62 | Ht 65.0 in | Wt 101.2 lb

## 2019-11-03 DIAGNOSIS — Z885 Allergy status to narcotic agent status: Secondary | ICD-10-CM | POA: Insufficient documentation

## 2019-11-03 DIAGNOSIS — Z7901 Long term (current) use of anticoagulants: Secondary | ICD-10-CM | POA: Diagnosis not present

## 2019-11-03 DIAGNOSIS — Z880 Allergy status to penicillin: Secondary | ICD-10-CM | POA: Diagnosis not present

## 2019-11-03 DIAGNOSIS — I442 Atrioventricular block, complete: Secondary | ICD-10-CM | POA: Insufficient documentation

## 2019-11-03 DIAGNOSIS — M1990 Unspecified osteoarthritis, unspecified site: Secondary | ICD-10-CM | POA: Insufficient documentation

## 2019-11-03 DIAGNOSIS — E039 Hypothyroidism, unspecified: Secondary | ICD-10-CM | POA: Insufficient documentation

## 2019-11-03 DIAGNOSIS — F0390 Unspecified dementia without behavioral disturbance: Secondary | ICD-10-CM | POA: Insufficient documentation

## 2019-11-03 DIAGNOSIS — R001 Bradycardia, unspecified: Secondary | ICD-10-CM | POA: Insufficient documentation

## 2019-11-03 DIAGNOSIS — I272 Pulmonary hypertension, unspecified: Secondary | ICD-10-CM | POA: Insufficient documentation

## 2019-11-03 DIAGNOSIS — Z888 Allergy status to other drugs, medicaments and biological substances status: Secondary | ICD-10-CM | POA: Insufficient documentation

## 2019-11-03 DIAGNOSIS — D649 Anemia, unspecified: Secondary | ICD-10-CM | POA: Diagnosis not present

## 2019-11-03 DIAGNOSIS — Z950 Presence of cardiac pacemaker: Secondary | ICD-10-CM | POA: Diagnosis not present

## 2019-11-03 DIAGNOSIS — D696 Thrombocytopenia, unspecified: Secondary | ICD-10-CM | POA: Diagnosis not present

## 2019-11-03 DIAGNOSIS — Z8673 Personal history of transient ischemic attack (TIA), and cerebral infarction without residual deficits: Secondary | ICD-10-CM | POA: Diagnosis not present

## 2019-11-03 DIAGNOSIS — H4720 Unspecified optic atrophy: Secondary | ICD-10-CM | POA: Insufficient documentation

## 2019-11-03 DIAGNOSIS — Z881 Allergy status to other antibiotic agents status: Secondary | ICD-10-CM | POA: Diagnosis not present

## 2019-11-03 DIAGNOSIS — Z886 Allergy status to analgesic agent status: Secondary | ICD-10-CM | POA: Diagnosis not present

## 2019-11-03 DIAGNOSIS — D6869 Other thrombophilia: Secondary | ICD-10-CM | POA: Insufficient documentation

## 2019-11-03 DIAGNOSIS — Z96641 Presence of right artificial hip joint: Secondary | ICD-10-CM | POA: Insufficient documentation

## 2019-11-03 DIAGNOSIS — Z8249 Family history of ischemic heart disease and other diseases of the circulatory system: Secondary | ICD-10-CM | POA: Insufficient documentation

## 2019-11-03 DIAGNOSIS — I4819 Other persistent atrial fibrillation: Secondary | ICD-10-CM | POA: Diagnosis not present

## 2019-11-03 DIAGNOSIS — Z79899 Other long term (current) drug therapy: Secondary | ICD-10-CM | POA: Insufficient documentation

## 2019-11-03 LAB — BASIC METABOLIC PANEL
Anion gap: 10 (ref 5–15)
BUN: 13 mg/dL (ref 8–23)
CO2: 27 mmol/L (ref 22–32)
Calcium: 8.7 mg/dL — ABNORMAL LOW (ref 8.9–10.3)
Chloride: 105 mmol/L (ref 98–111)
Creatinine, Ser: 0.81 mg/dL (ref 0.44–1.00)
GFR calc Af Amer: >60 mL/min (ref >60)
GFR calc non Af Amer: >60 mL/min (ref >60)
Glucose, Bld: 112 mg/dL — ABNORMAL HIGH (ref 70–99)
Potassium: 3.5 mmol/L (ref 3.5–5.1)
Sodium: 142 mmol/L (ref 135–145)

## 2019-11-03 LAB — MAGNESIUM: Magnesium: 2.4 mg/dL (ref 1.7–2.4)

## 2019-11-03 NOTE — Progress Notes (Signed)
Primary Care Physician: Leighton Ruff, MD Primary Cardiologist: Dr [name] Primary Electrophysiologist: Dr Rayann Heman Referring Physician: Dr Leeanne Rio Katrina Rodriguez is a 86 y.o. female with a history of prior TIA, dementia,  hypothyroidism, chronic anemia, CHB s/p PPM, and persistent atrial fibrillation who presents for follow up in the [facility]. Patient is on warfarin for a CHADS2VASC score of 5. Patient has been maintained on dofetilide. Remote device interrogation shows <0.1% mode switches. Patient and daughter reports she has done well. She denies any symptoms of afib. Denies bleeding issues on anticoagulation.   Today, she denies symptoms of palpitations, chest pain, shortness of breath, orthopnea, PND, lower extremity edema, dizziness, presyncope, syncope, snoring, daytime somnolence, bleeding, or neurologic sequela. The patient is tolerating medications without difficulties and is otherwise without complaint today.    Atrial Fibrillation Risk Factors:  she does not have symptoms or diagnosis of sleep apnea. she does not have a history of rheumatic fever.   she has a BMI of Body mass index is 16.84 kg/m.Marland Kitchen Filed Weights   11/03/19 1003  Weight: 45.9 kg    Family History  Problem Relation Age of Onset  . Heart disease Father   . Stroke Father   . Heart attack Father   . Heart disease Brother   . Heart disease Brother   . Hypertension Mother   . Diabetes Sister   . Heart attack Sister      Atrial Fibrillation Management history:  Previous antiarrhythmic drugs: dofetilide Previous cardioversions: none Previous ablations: none CHADS2VASC score: 5 Anticoagulation history: Eliquis   Past Medical History:  Diagnosis Date  . Arthritis   . Atrial fibrillation (HCC)    History of ablation - Dr. Rayann Heman  . Chest pain    Has normal coronaries per cath in 2005; negative nuclear in 2010  . Chronic anemia   . Complete heart block ([place])  06/09/2013  . H/O: CVA (cardiovascular accident)    Right internal capsule  . Hallucinations 12/19/2018  . Hypothyroidism 06/19/2017  . Left-sided weakness 06/19/2017  . Mild pulmonary hypertension ([place])   . 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 Orthostasis   . Retinal detachment   . Thrombocytopenia ([place])   . TIA (transient ischemic attack)   . Visual impairment 12/19/2018   Past Surgical History:  Procedure Laterality Date  . ATRIAL FIBRILLATION ABLATION    . CARDIAC CATHETERIZATION    . CARDIAC PACEMAKER PLACEMENT    . HIP ARTHROPLASTY Right 01/01/2019   Procedure: ARTHROPLASTY BIPOLAR HIP (HEMIARTHROPLASTY);  Surgeon: Rod Can, MD;  Location: [facility];  Service: Orthopedics;  Laterality: Right;  . PERMANENT PACEMAKER GENERATOR CHANGE N/A 06/12/2012   Procedure: PERMANENT PACEMAKER GENERATOR CHANGE;  Surgeon: Evans Lance, MD;  Location: [facility] CATH LAB;  Service: Cardiovascular;  Laterality: N/A;  . RETINAL DETACHMENT SURGERY      Current Outpatient Medications  Medication Sig Dispense Refill  . acetaminophen (TYLENOL) 500 MG tablet Take 500 mg by mouth every 6 (six) hours as needed for moderate pain.    Marland Kitchen albuterol (VENTOLIN HFA) 108 (90 Base) MCG/ACT inhaler Inhale into the lungs.    Marland Kitchen apixaban (ELIQUIS) 2.5 MG TABS tablet Take 1 tablet (2.5 mg total) by mouth 2 (two) times daily. 180 tablet 1  . ARIPiprazole (ABILIFY) 2 MG tablet Take 1 tablet (2 mg total) by mouth daily.    Marland Kitchen dofetilide (TIKOSYN) 250 MCG capsule TAKE 1 CAPSULE (250 MCG TOTAL) BY MOUTH 2 (  TWO) TIMES DAILY. 180 capsule 1  . fludrocortisone (FLORINEF) 0.1 MG tablet Take 1 tablet (0.1 mg total) by mouth 2 (two) times daily. 180 tablet 1  . memantine (NAMENDA) 5 MG tablet Take 5 mg by mouth 2 (two) times daily.    . potassium chloride SA (K-DUR,KLOR-CON) 20 MEQ tablet Take 2 tablets (40 mEq total) by mouth daily. 180 tablet 3  . traMADol (ULTRAM) 50 MG tablet Take 50 mg by mouth at bedtime as needed for moderate  pain.     . rosuvastatin (CRESTOR) 10 MG tablet Take 1 tablet (10 mg total) by mouth daily. (Patient not taking: Reported on 12/13/2018) 90 tablet 3   No current facility-administered medications for this encounter.    Allergies  Allergen Reactions  . Amiodarone Other (See Comments)    Caused thyroid problems  . Amoxicillin Hives  . Aspirin Other (See Comments)    On tikosyn and warfarin  . Codeine Other (See Comments)    Causes BP to drop  . Darvocet [Propoxyphene N-Acetaminophen] Hives  . Demerol Itching  . Eliquis [Apixaban] Nausea Only  . Penicillins Hives    Did it involve swelling of the face/tongue/throat, SOB, or low BP? Unknown Did it involve sudden or severe rash/hives, skin peeling, or any reaction on the inside of your mouth or nose? Unknown Did you need to seek medical attention at a hospital or doctor's office? Unknown When did it last happen?young adult or child?? If all above answers are "NO", may proceed with cephalosporin use.  Alveda Reasons [Rivaroxaban] Rash    Social History   Socioeconomic History  . Marital status: Widowed    Spouse name: Not on file  . Number of children: 2  . Years of education: 10  . Highest education level: Not on file  Occupational History  . Occupation: Scientist, research (physical sciences): RETIRED  Tobacco Use  . Smoking status: Never Smoker  . Smokeless tobacco: Never Used  Vaping Use  . Vaping Use: Never used  Substance and Sexual Activity  . Alcohol use: Never  . Drug use: Never  . Sexual activity: Not Currently  Other Topics Concern  . Not on file  Social History Narrative   Lives at home alone.   Caffeine use: Drinks coffee daily   Right handed    Social Determinants of Health   Financial Resource Strain:   . Difficulty of Paying Living Expenses: Not on file  Food Insecurity:   . Worried About Charity fundraiser in the Last Year: Not on file  . Ran Out of Food in the Last Year: Not on file  Transportation Needs:   .  Lack of Transportation (Medical): Not on file  . Lack of Transportation (Non-Medical): Not on file  Physical Activity:   . Days of Exercise per Week: Not on file  . Minutes of Exercise per Session: Not on file  Stress:   . Feeling of Stress : Not on file  Social Connections:   . Frequency of Communication with Friends and Family: Not on file  . Frequency of Social Gatherings with Friends and Family: Not on file  . Attends Religious Services: Not on file  . Active Member of Clubs or Organizations: Not on file  . Attends Archivist Meetings: Not on file  . Marital Status: Not on file  Intimate Partner Violence:   . Fear of Current or Ex-Partner: Not on file  . Emotionally Abused: Not on file  . Physically  Abused: Not on file  . Sexually Abused: Not on file     ROS- All systems are reviewed and negative except as per the HPI above.  Physical Exam: Vitals:   11/03/19 1003  BP: 132/64  Pulse: 62  Weight: 45.9 kg  Height: 5\' 5"  (1.651 m)    GEN- The patient is well appearing elderly female, alert and oriented x 3 today.   Head- normocephalic, atraumatic Eyes-  Sclera clear, conjunctiva pink Ears- hearing intact Oropharynx- clear Neck- supple  Lungs- Clear to ausculation bilaterally, normal work of breathing Heart- Regular rate and rhythm, no murmurs, rubs or gallops  GI- soft, NT, ND, + BS Extremities- no clubbing, cyanosis, or edema MS- no significant deformity or atrophy Skin- no rash or lesion Psych- euthymic mood, full affect Neuro- strength and sensation are intact  Wt Readings from Last 3 Encounters:  11/03/19 45.9 kg  12/30/18 50.8 kg  12/19/18 50.8 kg    EKG today demonstrates SR HR 62, NST, PR 130, QRS 66, QTc 479  Echo 07/02/18 demonstrated  1. The left ventricle has hyperdynamic systolic function, with an  ejection fraction of >65%. The cavity size was normal. Left ventricular  diastolic Doppler parameters are consistent with pseudonormal.    2. The right ventricle has normal systolc function. The cavity was  normal. There is no increase in right ventricular wall thickness.  3. The aortic valve is grossly normal Aortic valve regurgitation is mild  to moderate by color flow Doppler. No stenosis of the aortic valve.  4. The aortic root and ascending aorta are normal in size and structure.  5. The interatrial septum was not assessed.   Epic records are reviewed at length today  CHA2DS2-VASc Score = 5  The patient's score is based upon: CHF History: 0 HTN History: 0 Age : 2 Diabetes History: 0 Stroke History: 2 Vascular Disease History: 0 Gender: 1      ASSESSMENT AND PLAN: 1. Persistent Atrial Fibrillation (ICD10:  I48.19) The patient's CHA2DS2-VASc score is 5, indicating a 7.2% annual risk of stroke.   Patient appears to be maintaining SR. Continue dofetilide 250 mcg BID. QT stable. Dose OK with renal function per Dr Rayann Heman. Continue Eliquis 2.5 mg BID Check bmet/mag today.  2. Secondary Hypercoagulable State (ICD10:  D68.69) The patient is at significant risk for stroke/thromboembolism based upon her CHA2DS2-VASc Score of 5.  Continue Apixaban (Eliquis).   3. Symptomatic bradycardia S/p PPM, followed by Dr Rayann Heman and the device clinic.   Follow up with Dr Rayann Heman in 6 months.    [facility] [address] [place], [place] [address] [phone] 11/03/2019 10:41 AM

## 2019-11-14 DIAGNOSIS — I959 Hypotension, unspecified: Secondary | ICD-10-CM | POA: Diagnosis not present

## 2019-11-14 DIAGNOSIS — G3183 Dementia with Lewy bodies: Secondary | ICD-10-CM | POA: Diagnosis not present

## 2019-11-14 DIAGNOSIS — I482 Chronic atrial fibrillation, unspecified: Secondary | ICD-10-CM | POA: Diagnosis not present

## 2019-11-14 DIAGNOSIS — I459 Conduction disorder, unspecified: Secondary | ICD-10-CM | POA: Diagnosis not present

## 2019-12-03 DIAGNOSIS — G4751 Confusional arousals: Secondary | ICD-10-CM | POA: Diagnosis not present

## 2019-12-03 DIAGNOSIS — R300 Dysuria: Secondary | ICD-10-CM | POA: Diagnosis not present

## 2019-12-03 DIAGNOSIS — R443 Hallucinations, unspecified: Secondary | ICD-10-CM | POA: Diagnosis not present

## 2019-12-09 DIAGNOSIS — F0391 Unspecified dementia with behavioral disturbance: Secondary | ICD-10-CM | POA: Diagnosis not present

## 2019-12-09 DIAGNOSIS — Z87898 Personal history of other specified conditions: Secondary | ICD-10-CM | POA: Diagnosis not present

## 2019-12-09 DIAGNOSIS — F29 Unspecified psychosis not due to a substance or known physiological condition: Secondary | ICD-10-CM | POA: Diagnosis not present

## 2019-12-11 DIAGNOSIS — Z23 Encounter for immunization: Secondary | ICD-10-CM | POA: Diagnosis not present

## 2019-12-23 ENCOUNTER — Ambulatory Visit (INDEPENDENT_AMBULATORY_CARE_PROVIDER_SITE_OTHER): Payer: Medicare Other

## 2019-12-23 DIAGNOSIS — I4819 Other persistent atrial fibrillation: Secondary | ICD-10-CM

## 2019-12-25 LAB — CUP PACEART REMOTE DEVICE CHECK
Battery Impedance: 1549 Ohm
Battery Remaining Longevity: 47 mo
Battery Voltage: 2.76 V
Brady Statistic AP VP Percent: 0 %
Brady Statistic AP VS Percent: 54 %
Brady Statistic AS VP Percent: 0 %
Brady Statistic AS VS Percent: 45 %
Date Time Interrogation Session: 20211021074650
Implantable Lead Implant Date: 20040309
Implantable Lead Implant Date: 20040309
Implantable Lead Location: 753859
Implantable Lead Location: 753860
Implantable Lead Model: 4458
Implantable Lead Serial Number: 304362
Implantable Pulse Generator Implant Date: 20140409
Lead Channel Impedance Value: 620 Ohm
Lead Channel Impedance Value: 996 Ohm
Lead Channel Pacing Threshold Amplitude: 0.5 V
Lead Channel Pacing Threshold Amplitude: 0.625 V
Lead Channel Pacing Threshold Pulse Width: 0.4 ms
Lead Channel Pacing Threshold Pulse Width: 0.4 ms
Lead Channel Setting Pacing Amplitude: 2 V
Lead Channel Setting Pacing Amplitude: 2.5 V
Lead Channel Setting Pacing Pulse Width: 0.4 ms
Lead Channel Setting Sensing Sensitivity: 4 mV

## 2019-12-29 NOTE — Progress Notes (Signed)
Remote pacemaker transmission.   

## 2020-01-19 DIAGNOSIS — L84 Corns and callosities: Secondary | ICD-10-CM | POA: Diagnosis not present

## 2020-01-19 DIAGNOSIS — B351 Tinea unguium: Secondary | ICD-10-CM | POA: Diagnosis not present

## 2020-01-19 DIAGNOSIS — I739 Peripheral vascular disease, unspecified: Secondary | ICD-10-CM | POA: Diagnosis not present

## 2020-01-28 DIAGNOSIS — M25511 Pain in right shoulder: Secondary | ICD-10-CM | POA: Diagnosis not present

## 2020-02-03 DIAGNOSIS — F29 Unspecified psychosis not due to a substance or known physiological condition: Secondary | ICD-10-CM | POA: Diagnosis not present

## 2020-02-03 DIAGNOSIS — F0391 Unspecified dementia with behavioral disturbance: Secondary | ICD-10-CM | POA: Diagnosis not present

## 2020-02-04 DIAGNOSIS — N390 Urinary tract infection, site not specified: Secondary | ICD-10-CM | POA: Diagnosis not present

## 2020-02-04 DIAGNOSIS — G4751 Confusional arousals: Secondary | ICD-10-CM | POA: Diagnosis not present

## 2020-02-17 DIAGNOSIS — R443 Hallucinations, unspecified: Secondary | ICD-10-CM | POA: Diagnosis not present

## 2020-02-17 DIAGNOSIS — F29 Unspecified psychosis not due to a substance or known physiological condition: Secondary | ICD-10-CM | POA: Diagnosis not present

## 2020-02-17 DIAGNOSIS — F0391 Unspecified dementia with behavioral disturbance: Secondary | ICD-10-CM | POA: Diagnosis not present

## 2020-02-17 DIAGNOSIS — G4751 Confusional arousals: Secondary | ICD-10-CM | POA: Diagnosis not present

## 2020-02-20 DIAGNOSIS — G4751 Confusional arousals: Secondary | ICD-10-CM | POA: Diagnosis not present

## 2020-02-20 DIAGNOSIS — R300 Dysuria: Secondary | ICD-10-CM | POA: Diagnosis not present

## 2020-02-20 DIAGNOSIS — R443 Hallucinations, unspecified: Secondary | ICD-10-CM | POA: Diagnosis not present

## 2020-03-23 ENCOUNTER — Ambulatory Visit (INDEPENDENT_AMBULATORY_CARE_PROVIDER_SITE_OTHER): Payer: Medicare Other

## 2020-03-23 DIAGNOSIS — I4819 Other persistent atrial fibrillation: Secondary | ICD-10-CM | POA: Diagnosis not present

## 2020-03-25 LAB — CUP PACEART REMOTE DEVICE CHECK
Battery Impedance: 1663 Ohm
Battery Remaining Longevity: 43 mo
Battery Voltage: 2.76 V
Brady Statistic AP VP Percent: 0 %
Brady Statistic AP VS Percent: 54 %
Brady Statistic AS VP Percent: 0 %
Brady Statistic AS VS Percent: 45 %
Date Time Interrogation Session: 20220118155031
Implantable Lead Implant Date: 20040309
Implantable Lead Implant Date: 20040309
Implantable Lead Location: 753859
Implantable Lead Location: 753860
Implantable Lead Model: 4458
Implantable Lead Serial Number: 304362
Implantable Pulse Generator Implant Date: 20140409
Lead Channel Impedance Value: 1019 Ohm
Lead Channel Impedance Value: 667 Ohm
Lead Channel Pacing Threshold Amplitude: 0.5 V
Lead Channel Pacing Threshold Amplitude: 0.625 V
Lead Channel Pacing Threshold Pulse Width: 0.4 ms
Lead Channel Pacing Threshold Pulse Width: 0.4 ms
Lead Channel Setting Pacing Amplitude: 2 V
Lead Channel Setting Pacing Amplitude: 2.5 V
Lead Channel Setting Pacing Pulse Width: 0.4 ms
Lead Channel Setting Sensing Sensitivity: 5.6 mV

## 2020-04-06 NOTE — Progress Notes (Signed)
Remote pacemaker transmission.   

## 2020-04-19 ENCOUNTER — Other Ambulatory Visit: Payer: Self-pay

## 2020-04-19 ENCOUNTER — Encounter: Payer: Self-pay | Admitting: Internal Medicine

## 2020-04-19 ENCOUNTER — Ambulatory Visit (INDEPENDENT_AMBULATORY_CARE_PROVIDER_SITE_OTHER): Payer: Medicare Other | Admitting: Internal Medicine

## 2020-04-19 VITALS — BP 98/56 | HR 67 | Ht 65.0 in | Wt 95.0 lb

## 2020-04-19 DIAGNOSIS — I10 Essential (primary) hypertension: Secondary | ICD-10-CM | POA: Diagnosis not present

## 2020-04-19 DIAGNOSIS — R001 Bradycardia, unspecified: Secondary | ICD-10-CM

## 2020-04-19 DIAGNOSIS — I4819 Other persistent atrial fibrillation: Secondary | ICD-10-CM | POA: Diagnosis not present

## 2020-04-19 LAB — BASIC METABOLIC PANEL
BUN/Creatinine Ratio: 22 (ref 12–28)
BUN: 19 mg/dL (ref 8–27)
CO2: 24 mmol/L (ref 20–29)
Calcium: 8.5 mg/dL — ABNORMAL LOW (ref 8.7–10.3)
Chloride: 103 mmol/L (ref 96–106)
Creatinine, Ser: 0.88 mg/dL (ref 0.57–1.00)
GFR calc Af Amer: 67 mL/min/{1.73_m2} (ref >59)
GFR calc non Af Amer: 58 mL/min/{1.73_m2} — ABNORMAL LOW (ref >59)
Glucose: 96 mg/dL (ref 65–99)
Potassium: 4.2 mmol/L (ref 3.5–5.2)
Sodium: 142 mmol/L (ref 134–144)

## 2020-04-19 LAB — CBC
Hematocrit: 39.7 % (ref 34.0–46.6)
Hemoglobin: 13.3 g/dL (ref 11.1–15.9)
MCH: 31.4 pg (ref 26.6–33.0)
MCHC: 33.5 g/dL (ref 31.5–35.7)
MCV: 94 fL (ref 79–97)
Platelets: 139 10*3/uL — ABNORMAL LOW (ref 150–450)
RBC: 4.23 x10E6/uL (ref 3.77–5.28)
RDW: 12.3 % (ref 11.7–15.4)
WBC: 4.7 10*3/uL (ref 3.4–10.8)

## 2020-04-19 LAB — MAGNESIUM: Magnesium: 2.4 mg/dL — ABNORMAL HIGH (ref 1.6–2.3)

## 2020-04-19 NOTE — Patient Instructions (Addendum)
Medication Instructions:  Your physician recommends that you continue on your current medications as directed. Please refer to the Current Medication list given to you today.  Labwork: CBC, Bmet, Mag  Testing/Procedures: None ordered.  Follow-Up: Your physician wants you to follow-up in: 6 months with Dr. [name], one year with     Tommye Standard, PA-C     You will receive a reminder letter in the mail two months in advance. If you don't receive a letter, please call our office to schedule the follow-up appointment.  Remote monitoring is used to monitor your Pacemaker from home. This monitoring reduces the number of office visits required to check your device to one time per year. It allows [place] to keep an eye on the functioning of your device to ensure it is working properly. You are scheduled for a device check from home on 06/22/20. You may send your transmission at any time that day. If you have a wireless device, the transmission will be sent automatically. After your physician reviews your transmission, you will receive a postcard with your next transmission date.  Any Other Special Instructions Will Be Listed Below (If Applicable).  If you need a refill on your cardiac medications before your next appointment, please call your pharmacy.

## 2020-04-19 NOTE — Progress Notes (Signed)
PCP: Leighton Ruff, MD Primary Cardiologist: Dr [name] Primary EP:  Dr Leeanne Rio Katrina is a 87 y.o. female who presents today for routine electrophysiology followup.  She continues to decline.  She has Lewy body dementia.  She is accompanied by her daughter who provides history.  The patient has difficulty seeing and hearing.  Today, she denies symptoms of chest pain or SOB.  Past Medical History:  Diagnosis Date  . Arthritis   . Atrial fibrillation (HCC)    History of ablation - Dr. Rayann Heman  . Chest pain    Has normal coronaries per cath in 2005; negative nuclear in 2010  . Chronic anemia   . Complete heart block ([place]) 06/09/2013  . H/O: CVA (cardiovascular accident)    Right internal capsule  . Hallucinations 12/19/2018  . Hypothyroidism 06/19/2017  . Left-sided weakness 06/19/2017  . Mild pulmonary hypertension ([place])   . 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 Orthostasis   . Retinal detachment   . Thrombocytopenia ([place])   . TIA (transient ischemic attack)   . Visual impairment 12/19/2018   Past Surgical History:  Procedure Laterality Date  . ATRIAL FIBRILLATION ABLATION    . CARDIAC CATHETERIZATION    . CARDIAC PACEMAKER PLACEMENT    . HIP ARTHROPLASTY Right 01/01/2019   Procedure: ARTHROPLASTY BIPOLAR HIP (HEMIARTHROPLASTY);  Surgeon: Rod Can, MD;  Location: [place] [facility];  Service: Orthopedics;  Laterality: Right;  . PERMANENT PACEMAKER GENERATOR CHANGE N/A 06/12/2012   Procedure: PERMANENT PACEMAKER GENERATOR CHANGE;  Surgeon: Evans Lance, MD;  [facility] CATH LAB;  Service: Cardiovascular;  Laterality: N/A;  . RETINAL DETACHMENT SURGERY     ROS- pt unable to provide  Current Outpatient Medications  Medication Sig Dispense Refill  . acetaminophen (TYLENOL) 500 MG tablet Take 500 mg by mouth every 6 (six) hours as needed for moderate pain.    Marland Kitchen albuterol (VENTOLIN HFA) 108 (90 Base) MCG/ACT inhaler Inhale into the lungs.    Marland Kitchen apixaban (ELIQUIS) 2.5 MG  TABS tablet Take 1 tablet (2.5 mg total) by mouth 2 (two) times daily. 180 tablet 1  . ARIPiprazole (ABILIFY) 2 MG tablet Take 1 tablet (2 mg total) by mouth daily.    Marland Kitchen dofetilide (TIKOSYN) 250 MCG capsule TAKE 1 CAPSULE (250 MCG TOTAL) BY MOUTH 2 (TWO) TIMES DAILY. 180 capsule 1  . fludrocortisone (FLORINEF) 0.1 MG tablet Take 1 tablet (0.1 mg total) by mouth 2 (two) times daily. 180 tablet 1  . memantine (NAMENDA) 5 MG tablet Take 5 mg by mouth 2 (two) times daily.    . potassium chloride SA (K-DUR,KLOR-CON) 20 MEQ tablet Take 2 tablets (40 mEq total) by mouth daily. 180 tablet 3  . mirtazapine (REMERON) 15 MG tablet Take 15 mg by mouth at bedtime.    . rosuvastatin (CRESTOR) 10 MG tablet Take 1 tablet (10 mg total) by mouth daily. (Patient not taking: Reported on 12/13/2018) 90 tablet 3   No current facility-administered medications for this visit.    Physical Exam: Vitals:   04/19/20 1013  BP: (!) 98/56  Pulse: 67  SpO2: 90%  Weight: 95 lb (43.1 kg)  Height: 5\' 5"  (1.651 m)    GEN- The patient is elderly and frail appearing, alert and oriented x 3 today.   Head- normocephalic, atraumatic Eyes-  Poor vision Ears- hearing reduced Oropharynx- clear Lungs-   normal work of breathing Chest- pacemaker pocket is well healed Heart- Regular rate and rhythm  GI-  soft  Extremities- no clubbing, cyanosis, or edema  Pacemaker interrogation- reviewed in detail today,  See PACEART report  ekg tracing ordered today is personally reviewed and shows sinus rhythm, qtc 469 msec  Assessment and Plan:  1. Symptomatic sinus bradycardia  Normal pacemaker function See Pace Art report No changes today she is not device dependant today  2. afib Well controlled with tikosyn (burden <1%) Check cbc, bmet, mg today Qt is stable  chads2vasc score is at least 5.  She is on eliquis  3. HTN Stable No change required today  4. Orthostatic hypotension Well controlled with florinef   Risks,  benefits and potential toxicities for medications prescribed and/or refilled reviewed with patient today.   Return to see Dr [name] in 6 months Return to see EP PA in a year Will need bmet, mg every 6 months on tikosyn  Thompson Grayer MD, [facility] 04/19/2020 10:23 AM

## 2020-06-15 IMAGING — CT CT HEAD W/O CM
3 of 4 series · 15 of 47 positions shown, 18 images · non-contrast
Comparison: CT head and cervical spine dated November 28, 2018.

CLINICAL DATA: Syncope.

EXAM:
CT HEAD WITHOUT CONTRAST
CT CERVICAL SPINE WITHOUT CONTRAST
TECHNIQUE: Multidetector CT imaging of the head and cervical spine was
performed following the standard protocol without intravenous
contrast. Multiplanar CT image reconstructions of the cervical spine
were also generated.

[Series 3: head wo · axial · 0.42mm/px · z∈[-132,-12]mm · 9 of 30 slices shown, 12 images]
[im 3/30  brain]
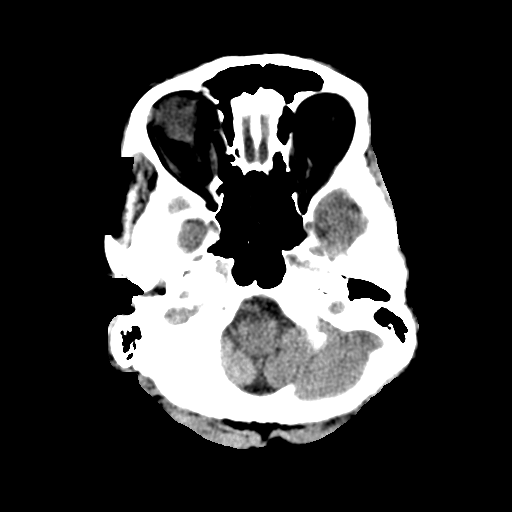
[im 3/30  bone]
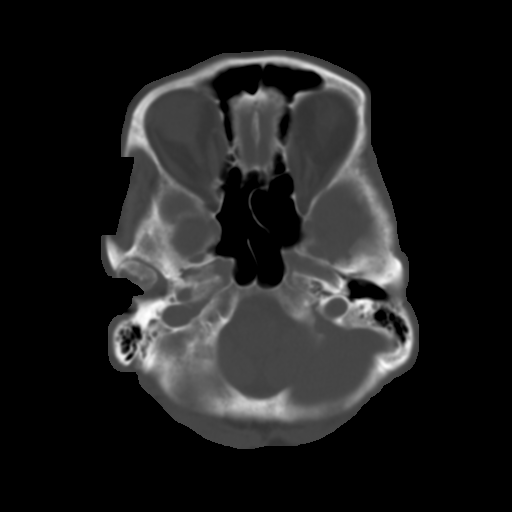
[im 6/30  brain]
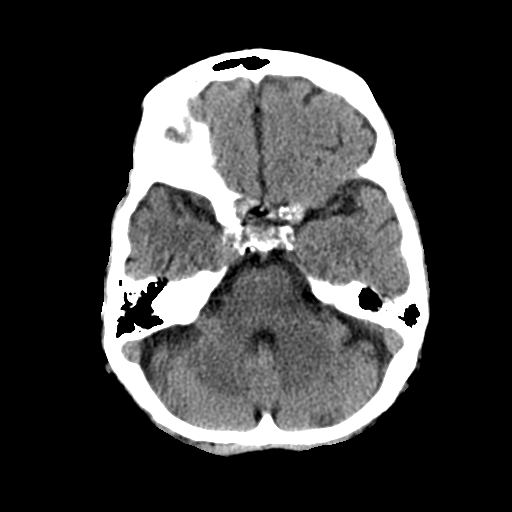
[im 9/30  brain]
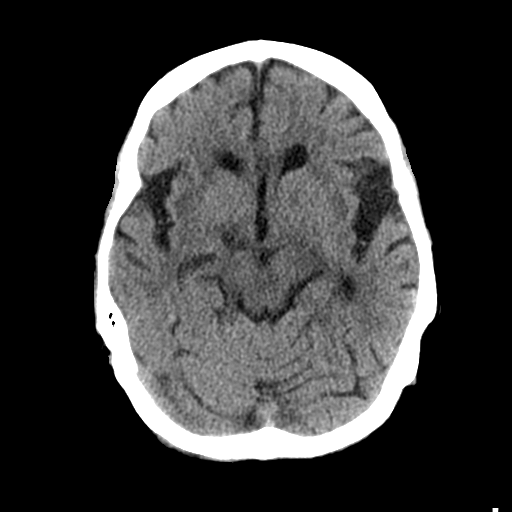
[im 12/30  brain]
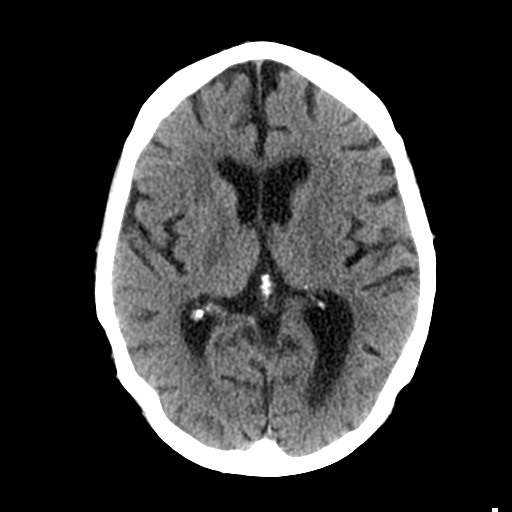
[im 15/30  brain]
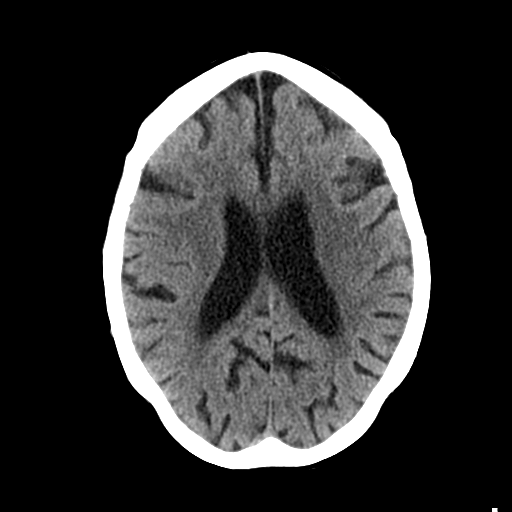
[im 15/30  bone]
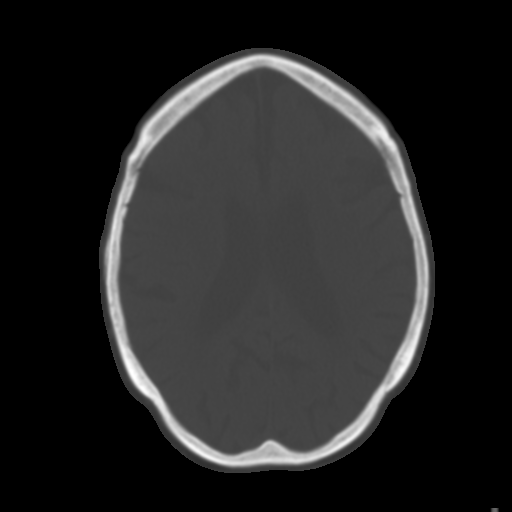
[im 18/30  brain]
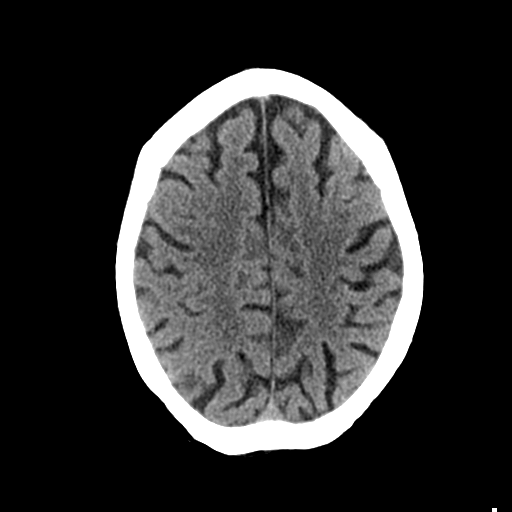
[im 21/30  brain]
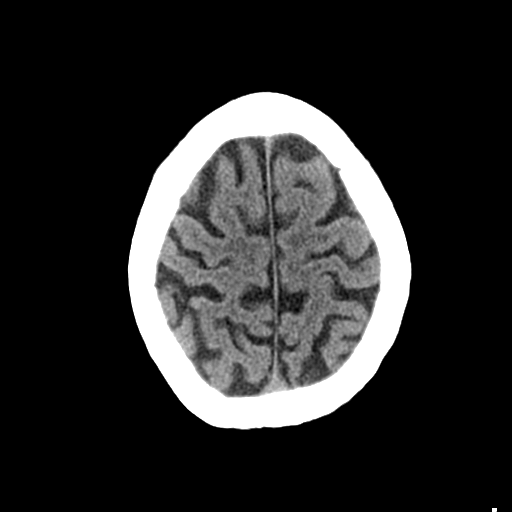
[im 24/30  brain]
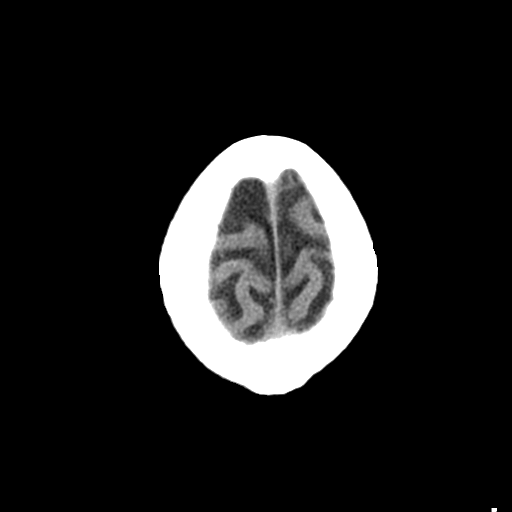
[im 27/30  brain]
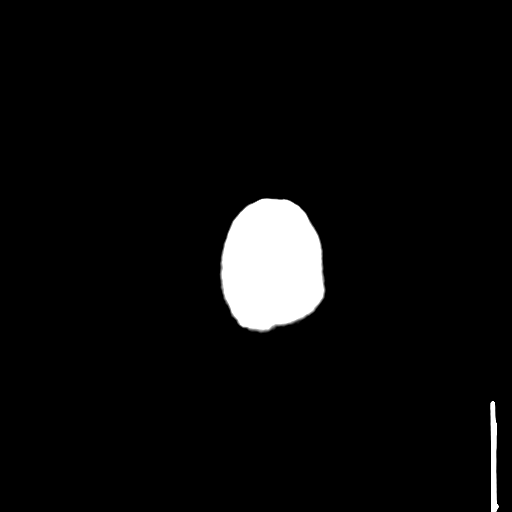
[im 27/30  bone]
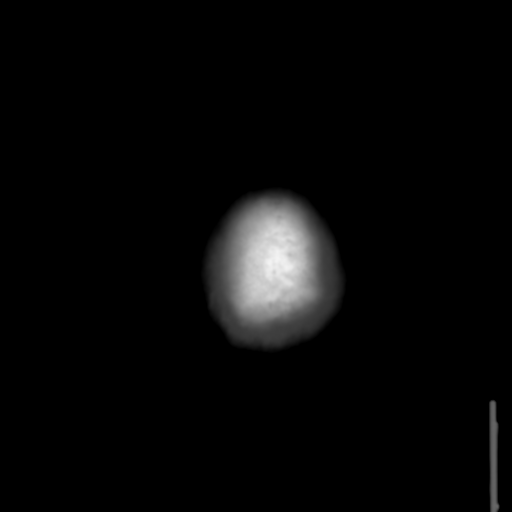

[Series 6: coronal soft tissue · coronal · 0.34mm/px · 3 of 69 slices shown]
[im 23/69  brain]
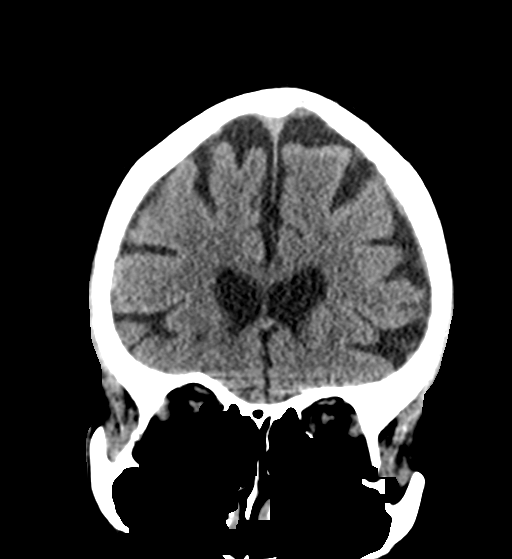
[im 31/69  brain]
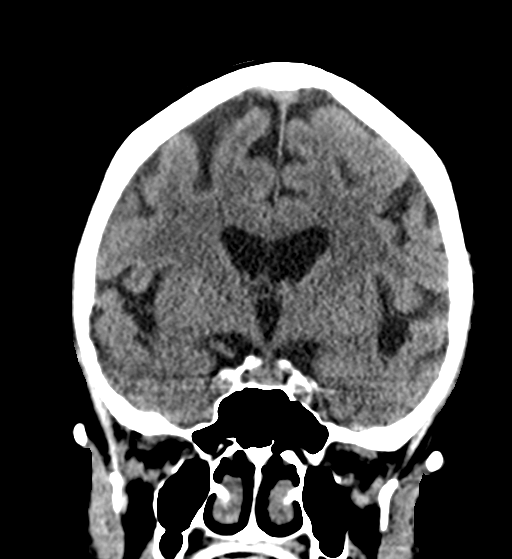
[im 38/69  brain]
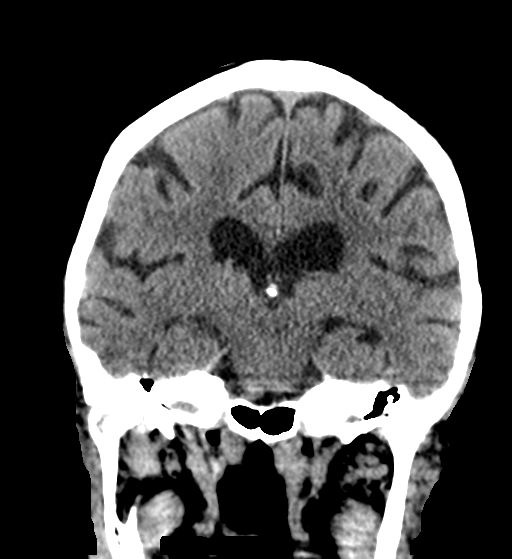

[Series 7: sagittal soft tissue · sagittal · 0.37mm/px · 3 of 59 slices shown]
[im 20/59  brain]
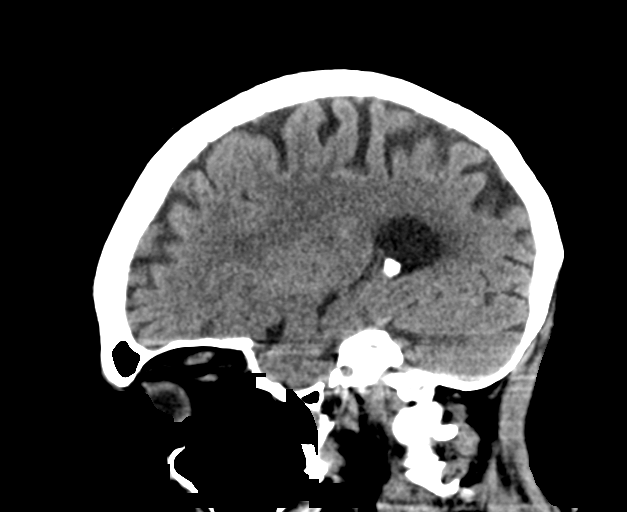
[im 30/59  brain]
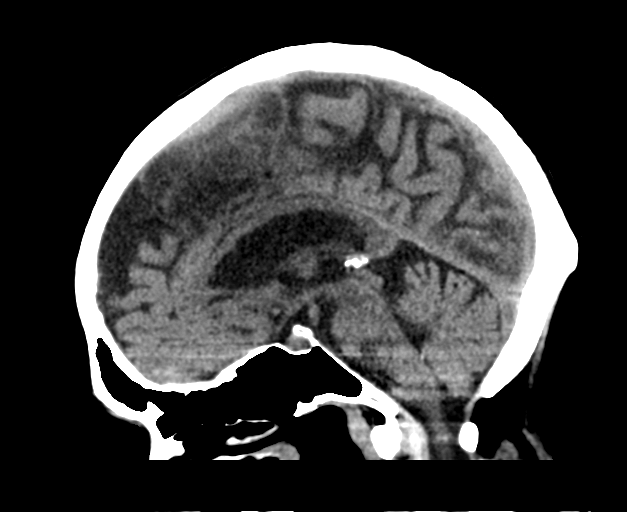
[im 39/59  brain]
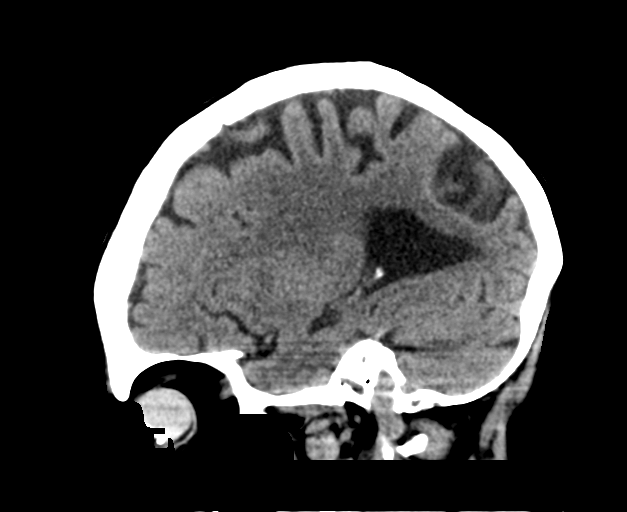

[15 of 47 positions shown; findings below may reference images not displayed]

FINDINGS: CT HEAD FINDINGS

Brain: No evidence of acute infarction, hemorrhage, hydrocephalus,
extra-axial collection or mass lesion/mass effect. Stable atrophy
and chronic microvascular ischemic changes.

Vascular: Atherosclerotic vascular calcification of the carotid
siphons. No hyperdense vessel.

Skull: Normal. Negative for fracture or focal lesion.

Sinuses/Orbits: No acute finding. Stable hyperdensity of the left
globe likely related to prior intra-ocular silicone injection.

Other: None.

CT CERVICAL SPINE FINDINGS

Alignment: Normal.

Skull base and vertebrae: No acute fracture. No primary bone lesion
or focal pathologic process.

Soft tissues and spinal canal: No prevertebral fluid or swelling. No
visible canal hematoma.

Disc levels: Stable diffuse cervical spondylosis with advanced
right-sided neuroforaminal stenosis at C4-C5 and C5-C6 due to
uncovertebral hypertrophy.

Upper chest: Negative.

Other: None.
IMPRESSION: 1.  No acute intracranial abnormality.
2.  No acute cervical spine fracture.

## 2020-06-22 ENCOUNTER — Ambulatory Visit (INDEPENDENT_AMBULATORY_CARE_PROVIDER_SITE_OTHER): Payer: Medicare Other

## 2020-06-22 DIAGNOSIS — I442 Atrioventricular block, complete: Secondary | ICD-10-CM

## 2020-06-24 LAB — CUP PACEART REMOTE DEVICE CHECK
Battery Impedance: 1776 Ohm
Battery Remaining Longevity: 41 mo
Battery Voltage: 2.76 V
Brady Statistic AP VP Percent: 0 %
Brady Statistic AP VS Percent: 51 %
Brady Statistic AS VP Percent: 0 %
Brady Statistic AS VS Percent: 49 %
Date Time Interrogation Session: 20220420152123
Implantable Lead Implant Date: 20040309
Implantable Lead Implant Date: 20040309
Implantable Lead Location: 753859
Implantable Lead Location: 753860
Implantable Lead Model: 4458
Implantable Lead Serial Number: 304362
Implantable Pulse Generator Implant Date: 20140409
Lead Channel Impedance Value: 669 Ohm
Lead Channel Impedance Value: 996 Ohm
Lead Channel Pacing Threshold Amplitude: 0.5 V
Lead Channel Pacing Threshold Amplitude: 0.75 V
Lead Channel Pacing Threshold Pulse Width: 0.4 ms
Lead Channel Pacing Threshold Pulse Width: 0.4 ms
Lead Channel Setting Pacing Amplitude: 2 V
Lead Channel Setting Pacing Amplitude: 2.5 V
Lead Channel Setting Pacing Pulse Width: 0.4 ms
Lead Channel Setting Sensing Sensitivity: 5.6 mV

## 2020-07-09 NOTE — Progress Notes (Signed)
Remote pacemaker transmission.   

## 2021-02-07 NOTE — Progress Notes (Signed)
hs   Katrina Rodriguez Date of Birth: 28-Jul-1928 Medical Record #[id number]  History of Present Illness: Katrina Rodriguez is seen today for follow up. Last seen by me in 2019. She has a history of atrial fibrillation. She is on chronic Tikosyn therapy. She has had a previous atrial fibrillation ablation. She has a history of recurrent syncope- multifactorial. She was noted to have intermittent complete heart block. Her pacemaker was reprogrammed to DDD with AV search to minimize V pacing.  She has a history of chronic chest pain with normal coronary anatomy by cardiac catheterization. She had a normal nuclear stress test 2/13. She has known sensitivity with V pacing with chest pain. Since last seen by me has been followed primarily by Dr Rayann Heman.   She was admitted April 16-20, 2019 with left sided weakness and diagnosed with TIA. CT head showed normal aging brain. CTA head showed no proximal stenosis or large vessel occlusion. MRI could not be obtained as patient has pacemaker. Echocardiogram showed an EF of 60-65%, no cardiac source of emboli was identified.  Wall motion was normal, there were no regional wall motion abnormalities. Carotid ultrasound: 1-39% ICA stenosis bilaterally. LDL 134, hemoglobin A1c was not done. Neurology consulted and appreciated, likely presented with TIA with embolic etiology from atrial fibrillation despite optimal anticoagulation.  She has been on Eliquis and Xarelto in the past and was not tolerated, would like to remain on Coumadin. She also had complaints of chest pressure but troponins were normal.  She was admitted in April 2020 with apparent TIA. Neuro recommended switching from Coumadin to Eliquis. In October 2020 she was admitted with hip fracture and had surgery.  Was last seen by Dr Rayann Heman in Feb this year. AFib burden < 1%. Pacer check was OK in April. Noted steady decline with Lewy body dementia.   She did fall in October 2020 and had partial hip replacement. Daughter reports  her Rehab did not go well and she has gone downhill since.   She is now living at [facility] in memory unit. Is wheelchair bound. Dementia is severe. No syncope.   Current Outpatient Medications on File Prior to Visit  Medication Sig Dispense Refill   acetaminophen (TYLENOL) 500 MG tablet Take 500 mg by mouth every 6 (six) hours as needed for moderate pain.     albuterol (VENTOLIN HFA) 108 (90 Base) MCG/ACT inhaler Inhale into the lungs.     apixaban (ELIQUIS) 2.5 MG TABS tablet Take 1 tablet (2.5 mg total) by mouth 2 (two) times daily. 180 tablet 1   dofetilide (TIKOSYN) 250 MCG capsule TAKE 1 CAPSULE (250 MCG TOTAL) BY MOUTH 2 (TWO) TIMES DAILY. 180 capsule 1   fludrocortisone (FLORINEF) 0.1 MG tablet Take 1 tablet (0.1 mg total) by mouth 2 (two) times daily. 180 tablet 1   memantine (NAMENDA) 5 MG tablet Take 5 mg by mouth 2 (two) times daily.     mirtazapine (REMERON) 15 MG tablet Take 15 mg by mouth at bedtime.     potassium chloride SA (K-DUR,KLOR-CON) 20 MEQ tablet Take 2 tablets (40 mEq total) by mouth daily. 180 tablet 3   ARIPiprazole (ABILIFY) 2 MG tablet Take 1 tablet (2 mg total) by mouth daily. (Patient not taking: Reported on 02/11/2021)     ketoconazole (NIZORAL) 2 % shampoo Apply topically.     rosuvastatin (CRESTOR) 10 MG tablet Take 1 tablet (10 mg total) by mouth daily. (Patient not taking: Reported on 12/13/2018) 90 tablet 3   No current  facility-administered medications on file prior to visit.    Allergies  Allergen Reactions   Amiodarone Other (See Comments)    Caused thyroid problems   Amoxicillin Hives   Aspirin Other (See Comments)    On tikosyn and warfarin   Codeine Other (See Comments)    Causes BP to drop   Darvocet [Propoxyphene N-Acetaminophen] Hives   Demerol Itching   Eliquis [Apixaban] Nausea Only   Penicillins Hives    Did it involve swelling of the face/tongue/throat, SOB, or low BP? Unknown Did it involve sudden or severe rash/hives, skin  peeling, or any reaction on the inside of your mouth or nose? Unknown Did you need to seek medical attention at a hospital or doctor's office? Unknown When did it last happen?   young adult or child??    If all above answers are "NO", may proceed with cephalosporin use.   Xarelto [Rivaroxaban] Rash    Past Medical History:  Diagnosis Date   Arthritis    Atrial fibrillation [facility])    History of ablation - Dr. 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   Orthostasis    Retinal detachment    Thrombocytopenia ([place])    TIA (transient ischemic attack)    Visual impairment 12/19/2018    Past Surgical History:  Procedure Laterality Date   ATRIAL FIBRILLATION ABLATION     CARDIAC CATHETERIZATION     CARDIAC PACEMAKER PLACEMENT     HIP ARTHROPLASTY Right 01/01/2019   Procedure: ARTHROPLASTY BIPOLAR HIP (HEMIARTHROPLASTY);  Surgeon: Rod Can, MD;  Location: [facility];  Service: Orthopedics;  Laterality: Right;   PERMANENT PACEMAKER GENERATOR CHANGE N/A 06/12/2012   Procedure: PERMANENT PACEMAKER GENERATOR CHANGE;  Surgeon: Evans Lance, MD;  Location: [facility] CATH LAB;  Service: Cardiovascular;  Laterality: N/A;   RETINAL DETACHMENT SURGERY      Social History   Tobacco Use  Smoking Status Never  Smokeless Tobacco Never    Social History   Substance and Sexual Activity  Alcohol Use Never    Family History  Problem Relation Age of Onset   Heart disease Father    Stroke Father    Heart attack Father    Heart disease Brother    Heart disease Brother    Hypertension Mother    Diabetes Sister    Heart attack Sister     Review of Systems: The  review of systems is positive for  vision loss.  All other systems were reviewed and are negative.  Physical Exam: BP (!) 118/50 (BP Location: Left Arm)   Pulse 66   Ht 5\' 5"  (1.651 m)   Wt 101 lb (45.8 kg)   SpO2 97%   BMI 16.81 kg/m  GENERAL:  Elderly WF in wheelchair. HEENT:  PERRL, EOMI, sclera are clear. Oropharynx is clear. NECK:  No jugular venous distention, carotid upstroke brisk and symmetric, no bruits, no thyromegaly or adenopathy LUNGS:  Clear to auscultation bilaterally CHEST:  Unremarkable HEART:  RRR,  PMI not displaced or sustained,S1 and S2 within normal limits, no S3, no S4: no clicks, no rubs, no murmurs ABD:  Soft, nontender. BS +,  no masses or bruits. No hepatomegaly, no splenomegaly EXT:  2 + pulses throughout, some dependent edema with dependent cyanosis of feet SKIN:  Warm and dry.  No rashes NEURO:  Alert and oriented x 3. Cranial nerves II through XII intact. Walks with a cane. Poor vision. 4/5 strength left side.  PSYCH:  Cognitively intact     LABORATORY DATA:  Lab Results  Component Value Date   WBC 4.7 04/19/2020   HGB 13.3 04/19/2020   HCT 39.7 04/19/2020   PLT 139 (L) 04/19/2020   GLUCOSE 96 04/19/2020   CHOL 137 07/02/2018   TRIG 75 07/02/2018   HDL 58 07/02/2018   LDLCALC 64 07/02/2018   ALT 22 01/01/2019   AST 37 01/01/2019   NA 142 04/19/2020   K 4.2 04/19/2020   CL 103 04/19/2020   CREATININE 0.88 04/19/2020   BUN 19 04/19/2020   CO2 24 04/19/2020   TSH 1.04 09/16/2012   INR 2.0 (H) 07/03/2018   HGBA1C 5.6 07/02/2018   Dated 07/09/17: Normal CBC, CMET, TSH  Ecg today shows NSR rate 66. Normal QTc 478 msec. I have personally reviewed and interpreted this study.   Echo 06/20/17: Study Conclusions   - Left ventricle: The cavity size was normal. Wall thickness was   normal. Systolic function was normal. The estimated ejection   fraction was in the range of 60% to 65%. Wall motion was normal;   there were no regional wall motion  abnormalities. There was no   evidence of elevated ventricular filling pressure by Doppler   parameters. - Aortic valve: Trileaflet; moderately thickened, moderately   calcified leaflets. Valve mobility was restricted. There was   moderate regurgitation. - Mitral valve: There was moderate regurgitation. - Left atrium: The atrium was mildly dilated. - Tricuspid valve: There was moderate regurgitation. - Pulmonary arteries: Systolic pressure was mildly increased. PA   peak pressure: 36 mm Hg (S).   Impressions:   - No cardiac source of emboli was indentified  Assessment / Plan: 1. Atrial fibrillation.  maintaining sinus rhythm well on Tikosyn. Status post ablation.  Currently on Eliquis. Will update labs with CBC, BMET, Magnesium level.   2. Chronic chest pain. Ischemic evaluation has been negative. No complaints now.  3. History of CVA in past. TIA in April 2020.  Still has persistent left sided weakness. On chronic eliquis  4. Sick sinus syndrome and CHB status post pacemaker implant.  Patient has regular followup in pacemaker clinic.  5. Recurrent syncope, multifactorial. She is on chronic Florinef. Asymptomatic now.  6. Hypercholesterolemia.   7. Lewy body dementia- advanced. DNR in place.

## 2021-02-11 ENCOUNTER — Encounter: Payer: Self-pay | Admitting: Cardiology

## 2021-02-11 ENCOUNTER — Ambulatory Visit (INDEPENDENT_AMBULATORY_CARE_PROVIDER_SITE_OTHER): Payer: Medicare Other | Admitting: Cardiology

## 2021-02-11 ENCOUNTER — Other Ambulatory Visit: Payer: Self-pay

## 2021-02-11 VITALS — BP 118/50 | HR 66 | Ht 65.0 in | Wt 101.0 lb

## 2021-02-11 DIAGNOSIS — I480 Paroxysmal atrial fibrillation: Secondary | ICD-10-CM | POA: Diagnosis not present

## 2021-02-11 DIAGNOSIS — I442 Atrioventricular block, complete: Secondary | ICD-10-CM

## 2021-02-11 DIAGNOSIS — I10 Essential (primary) hypertension: Secondary | ICD-10-CM

## 2021-02-11 DIAGNOSIS — R001 Bradycardia, unspecified: Secondary | ICD-10-CM | POA: Diagnosis not present

## 2021-02-12 LAB — BASIC METABOLIC PANEL
BUN/Creatinine Ratio: 22 (ref 12–28)
BUN: 18 mg/dL (ref 10–36)
CO2: 22 mmol/L (ref 20–29)
Calcium: 8.1 mg/dL — ABNORMAL LOW (ref 8.7–10.3)
Chloride: 105 mmol/L (ref 96–106)
Creatinine, Ser: 0.82 mg/dL (ref 0.57–1.00)
Glucose: 123 mg/dL — ABNORMAL HIGH (ref 70–99)
Potassium: 4.1 mmol/L (ref 3.5–5.2)
Sodium: 142 mmol/L (ref 134–144)
eGFR: 68 mL/min/{1.73_m2} (ref >59)

## 2021-02-12 LAB — CBC WITH DIFFERENTIAL/PLATELET
Basophils Absolute: 0 10*3/uL (ref 0.0–0.2)
Basos: 1 %
EOS (ABSOLUTE): 0.1 10*3/uL (ref 0.0–0.4)
Eos: 2 %
Hematocrit: 38.1 % (ref 34.0–46.6)
Hemoglobin: 12.9 g/dL (ref 11.1–15.9)
Immature Grans (Abs): 0 10*3/uL (ref 0.0–0.1)
Immature Granulocytes: 0 %
Lymphocytes Absolute: 1.4 10*3/uL (ref 0.7–3.1)
Lymphs: 29 %
MCH: 31.1 pg (ref 26.6–33.0)
MCHC: 33.9 g/dL (ref 31.5–35.7)
MCV: 92 fL (ref 79–97)
Monocytes Absolute: 0.4 10*3/uL (ref 0.1–0.9)
Monocytes: 9 %
Neutrophils Absolute: 2.8 10*3/uL (ref 1.4–7.0)
Neutrophils: 59 %
Platelets: 142 10*3/uL — ABNORMAL LOW (ref 150–450)
RBC: 4.15 x10E6/uL (ref 3.77–5.28)
RDW: 12.6 % (ref 11.7–15.4)
WBC: 4.7 10*3/uL (ref 3.4–10.8)

## 2021-02-12 LAB — MAGNESIUM: Magnesium: 2.3 mg/dL (ref 1.6–2.3)

## 2021-08-16 ENCOUNTER — Ambulatory Visit: Payer: Medicare Other | Admitting: Cardiology

## 2021-09-20 ENCOUNTER — Telehealth: Payer: Self-pay

## 2021-09-20 NOTE — Telephone Encounter (Signed)
Pt is in hospice care. She nearing the end of life stages. I canceled all upcoming remotes.

## 2021-10-04 DEATH — deceased
